# Patient Record
Sex: Female | Born: 1965 | Race: White | Hispanic: No | Smoking: Current some day smoker
Health system: Southern US, Community
[De-identification: ages and names within clinical notes are randomized; demographics above are authoritative.]

## PROBLEM LIST (undated history)

## (undated) DIAGNOSIS — G4730 Sleep apnea, unspecified: Secondary | ICD-10-CM

## (undated) DIAGNOSIS — E785 Hyperlipidemia, unspecified: Secondary | ICD-10-CM

## (undated) DIAGNOSIS — Z9889 Other specified postprocedural states: Secondary | ICD-10-CM

## (undated) DIAGNOSIS — R0600 Dyspnea, unspecified: Secondary | ICD-10-CM

## (undated) DIAGNOSIS — M1990 Unspecified osteoarthritis, unspecified site: Secondary | ICD-10-CM

## (undated) DIAGNOSIS — T884XXA Failed or difficult intubation, initial encounter: Secondary | ICD-10-CM

## (undated) DIAGNOSIS — F419 Anxiety disorder, unspecified: Secondary | ICD-10-CM

## (undated) DIAGNOSIS — J449 Chronic obstructive pulmonary disease, unspecified: Secondary | ICD-10-CM

## (undated) DIAGNOSIS — K5792 Diverticulitis of intestine, part unspecified, without perforation or abscess without bleeding: Secondary | ICD-10-CM

## (undated) DIAGNOSIS — G8929 Other chronic pain: Secondary | ICD-10-CM

## (undated) DIAGNOSIS — Z720 Tobacco use: Secondary | ICD-10-CM

## (undated) DIAGNOSIS — I739 Peripheral vascular disease, unspecified: Secondary | ICD-10-CM

## (undated) DIAGNOSIS — M797 Fibromyalgia: Secondary | ICD-10-CM

## (undated) DIAGNOSIS — R112 Nausea with vomiting, unspecified: Secondary | ICD-10-CM

## (undated) DIAGNOSIS — I10 Essential (primary) hypertension: Secondary | ICD-10-CM

## (undated) DIAGNOSIS — C50919 Malignant neoplasm of unspecified site of unspecified female breast: Secondary | ICD-10-CM

## (undated) DIAGNOSIS — M549 Dorsalgia, unspecified: Secondary | ICD-10-CM

## (undated) HISTORY — DX: Chronic obstructive pulmonary disease, unspecified: J44.9

## (undated) HISTORY — DX: Hyperlipidemia, unspecified: E78.5

## (undated) HISTORY — DX: Diverticulitis of intestine, part unspecified, without perforation or abscess without bleeding: K57.92

## (undated) HISTORY — PX: ABDOMINAL HYSTERECTOMY: SHX81

## (undated) HISTORY — DX: Malignant neoplasm of unspecified site of unspecified female breast: C50.919

## (undated) HISTORY — DX: Unspecified osteoarthritis, unspecified site: M19.90

## (undated) HISTORY — DX: Fibromyalgia: M79.7

## (undated) HISTORY — DX: Anxiety disorder, unspecified: F41.9

## (undated) HISTORY — DX: Sleep apnea, unspecified: G47.30

## (undated) HISTORY — PX: CHOLECYSTECTOMY: SHX55

---

## 2003-10-27 ENCOUNTER — Inpatient Hospital Stay (HOSPITAL_COMMUNITY): Admission: AD | Admit: 2003-10-27 | Discharge: 2003-10-27 | Payer: Self-pay | Admitting: Family Medicine

## 2004-04-26 ENCOUNTER — Emergency Department (HOSPITAL_COMMUNITY): Admission: EM | Admit: 2004-04-26 | Discharge: 2004-04-26 | Payer: Self-pay | Admitting: Emergency Medicine

## 2004-06-19 ENCOUNTER — Emergency Department (HOSPITAL_COMMUNITY): Admission: EM | Admit: 2004-06-19 | Discharge: 2004-06-20 | Payer: Self-pay | Admitting: Emergency Medicine

## 2004-06-21 ENCOUNTER — Emergency Department (HOSPITAL_COMMUNITY): Admission: EM | Admit: 2004-06-21 | Discharge: 2004-06-21 | Payer: Self-pay | Admitting: Emergency Medicine

## 2004-08-26 ENCOUNTER — Emergency Department (HOSPITAL_COMMUNITY): Admission: EM | Admit: 2004-08-26 | Discharge: 2004-08-26 | Payer: Self-pay | Admitting: *Deleted

## 2004-11-18 ENCOUNTER — Emergency Department (HOSPITAL_COMMUNITY): Admission: EM | Admit: 2004-11-18 | Discharge: 2004-11-18 | Payer: Self-pay | Admitting: Emergency Medicine

## 2005-07-15 ENCOUNTER — Ambulatory Visit (HOSPITAL_COMMUNITY): Admission: RE | Admit: 2005-07-15 | Discharge: 2005-07-15 | Payer: Self-pay | Admitting: Family Medicine

## 2006-09-13 ENCOUNTER — Emergency Department (HOSPITAL_COMMUNITY): Admission: EM | Admit: 2006-09-13 | Discharge: 2006-09-13 | Payer: Self-pay | Admitting: Emergency Medicine

## 2008-02-03 ENCOUNTER — Emergency Department (HOSPITAL_COMMUNITY): Admission: EM | Admit: 2008-02-03 | Discharge: 2008-02-03 | Payer: Self-pay | Admitting: Emergency Medicine

## 2008-03-30 ENCOUNTER — Emergency Department (HOSPITAL_COMMUNITY): Admission: EM | Admit: 2008-03-30 | Discharge: 2008-03-30 | Payer: Self-pay | Admitting: Emergency Medicine

## 2009-12-23 ENCOUNTER — Emergency Department (HOSPITAL_COMMUNITY)
Admission: EM | Admit: 2009-12-23 | Discharge: 2009-12-24 | Payer: Self-pay | Source: Home / Self Care | Admitting: Emergency Medicine

## 2009-12-24 ENCOUNTER — Emergency Department (HOSPITAL_COMMUNITY)
Admission: EM | Admit: 2009-12-24 | Discharge: 2009-12-25 | Payer: Self-pay | Source: Home / Self Care | Admitting: Emergency Medicine

## 2010-04-29 LAB — DIFFERENTIAL
Basophils Absolute: 0 10*3/uL (ref 0.0–0.1)
Basophils Relative: 0 % (ref 0–1)
Eosinophils Absolute: 0.1 10*3/uL (ref 0.0–0.7)
Eosinophils Relative: 1 % (ref 0–5)
Lymphocytes Relative: 31 % (ref 12–46)
Lymphs Abs: 2.8 10*3/uL (ref 0.7–4.0)
Monocytes Absolute: 0.4 10*3/uL (ref 0.1–1.0)
Monocytes Relative: 4 % (ref 3–12)
Neutro Abs: 5.7 10*3/uL (ref 1.7–7.7)
Neutrophils Relative %: 63 % (ref 43–77)

## 2010-04-29 LAB — URINALYSIS, ROUTINE W REFLEX MICROSCOPIC
Bilirubin Urine: NEGATIVE
Bilirubin Urine: NEGATIVE
Glucose, UA: >1000 mg/dL — AB
Glucose, UA: >1000 mg/dL — AB
Hgb urine dipstick: NEGATIVE
Hgb urine dipstick: NEGATIVE
Ketones, ur: NEGATIVE mg/dL
Ketones, ur: NEGATIVE mg/dL
Leukocytes, UA: NEGATIVE
Leukocytes, UA: NEGATIVE
Nitrite: NEGATIVE
Nitrite: NEGATIVE
Protein, ur: NEGATIVE mg/dL
Protein, ur: NEGATIVE mg/dL
Specific Gravity, Urine: 1.006 (ref 1.005–1.030)
Specific Gravity, Urine: 1.022 (ref 1.005–1.030)
Urobilinogen, UA: 0.2 mg/dL (ref 0.0–1.0)
Urobilinogen, UA: 1 mg/dL (ref 0.0–1.0)
pH: 6.5 (ref 5.0–8.0)
pH: 6.5 (ref 5.0–8.0)

## 2010-04-29 LAB — POCT PREGNANCY, URINE: Preg Test, Ur: NEGATIVE

## 2010-04-29 LAB — CBC
HCT: 44 % (ref 36.0–46.0)
Hemoglobin: 15.6 g/dL — ABNORMAL HIGH (ref 12.0–15.0)
MCHC: 35.5 g/dL (ref 30.0–36.0)
MCV: 87.2 fL (ref 78.0–100.0)
Platelets: 283 10*3/uL (ref 150–400)
RBC: 5.04 MIL/uL (ref 3.87–5.11)
RDW: 14.2 % (ref 11.5–15.5)
WBC: 9 10*3/uL (ref 4.0–10.5)

## 2010-04-29 LAB — POCT I-STAT, CHEM 8
BUN: 10 mg/dL (ref 6–23)
Calcium, Ion: 1.16 mmol/L (ref 1.12–1.32)
Chloride: 98 mEq/L (ref 96–112)
Creatinine, Ser: 0.8 mg/dL (ref 0.4–1.2)
Glucose, Bld: 336 mg/dL — ABNORMAL HIGH (ref 70–99)
HCT: 47 % — ABNORMAL HIGH (ref 36.0–46.0)
Hemoglobin: 16 g/dL — ABNORMAL HIGH (ref 12.0–15.0)
Potassium: 4.1 mEq/L (ref 3.5–5.1)
Sodium: 133 mEq/L — ABNORMAL LOW (ref 135–145)
TCO2: 31 mmol/L (ref 0–100)

## 2010-04-29 LAB — URINE MICROSCOPIC-ADD ON

## 2010-04-29 LAB — GLUCOSE, CAPILLARY
Glucose-Capillary: 213 mg/dL — ABNORMAL HIGH (ref 70–99)
Glucose-Capillary: 458 mg/dL — ABNORMAL HIGH (ref 70–99)

## 2010-10-29 LAB — COMPREHENSIVE METABOLIC PANEL
ALT: 45 — ABNORMAL HIGH
AST: 34
Albumin: 3.3 — ABNORMAL LOW
Alkaline Phosphatase: 126 — ABNORMAL HIGH
BUN: 6
CO2: 31
Calcium: 9.3
Chloride: 103
Creatinine, Ser: 0.79
GFR calc Af Amer: >60
GFR calc non Af Amer: >60
Glucose, Bld: 164 — ABNORMAL HIGH
Potassium: 3.9
Sodium: 139
Total Bilirubin: 0.7
Total Protein: 6.6

## 2010-10-29 LAB — CBC
HCT: 37.5
Hemoglobin: 13.1
MCHC: 34.9
MCV: 85.5
Platelets: 315
RBC: 4.38
RDW: 14.3 — ABNORMAL HIGH
WBC: 9.1

## 2010-10-29 LAB — DIFFERENTIAL
Basophils Absolute: 0
Basophils Relative: 1
Eosinophils Absolute: 0.1
Eosinophils Relative: 1
Lymphocytes Relative: 30
Lymphs Abs: 2.7
Monocytes Absolute: 0.4
Monocytes Relative: 5
Neutro Abs: 5.8
Neutrophils Relative %: 64

## 2010-10-29 LAB — URINALYSIS, ROUTINE W REFLEX MICROSCOPIC
Bilirubin Urine: NEGATIVE
Glucose, UA: NEGATIVE
Hgb urine dipstick: NEGATIVE
Ketones, ur: NEGATIVE
Nitrite: NEGATIVE
Protein, ur: NEGATIVE
Specific Gravity, Urine: 1.008
Urobilinogen, UA: 0.2
pH: 6

## 2010-10-29 LAB — LIPASE, BLOOD: Lipase: 25

## 2010-11-15 ENCOUNTER — Emergency Department (HOSPITAL_COMMUNITY)
Admission: EM | Admit: 2010-11-15 | Discharge: 2010-11-15 | Disposition: A | Discharge status: Home / Self Care | Payer: Self-pay | Attending: Emergency Medicine | Admitting: Emergency Medicine

## 2010-11-15 DIAGNOSIS — M545 Low back pain, unspecified: Secondary | ICD-10-CM | POA: Insufficient documentation

## 2010-11-15 DIAGNOSIS — M549 Dorsalgia, unspecified: Secondary | ICD-10-CM | POA: Insufficient documentation

## 2010-11-15 DIAGNOSIS — E119 Type 2 diabetes mellitus without complications: Secondary | ICD-10-CM | POA: Insufficient documentation

## 2010-11-27 ENCOUNTER — Emergency Department (HOSPITAL_COMMUNITY)
Admission: EM | Admit: 2010-11-27 | Discharge: 2010-11-27 | Disposition: A | Discharge status: Home / Self Care | Payer: Self-pay | Attending: Emergency Medicine | Admitting: Emergency Medicine

## 2010-11-27 ENCOUNTER — Encounter: Payer: Self-pay | Admitting: *Deleted

## 2010-11-27 DIAGNOSIS — M5380 Other specified dorsopathies, site unspecified: Secondary | ICD-10-CM | POA: Insufficient documentation

## 2010-11-27 DIAGNOSIS — M545 Low back pain, unspecified: Secondary | ICD-10-CM

## 2010-11-27 DIAGNOSIS — M549 Dorsalgia, unspecified: Secondary | ICD-10-CM | POA: Insufficient documentation

## 2010-11-27 DIAGNOSIS — G8929 Other chronic pain: Secondary | ICD-10-CM

## 2010-11-27 HISTORY — DX: Other chronic pain: G89.29

## 2010-11-27 HISTORY — DX: Essential (primary) hypertension: I10

## 2010-11-27 HISTORY — DX: Dorsalgia, unspecified: M54.9

## 2010-11-27 MED ORDER — PREDNISONE 50 MG PO TABS: ORAL_TABLET | ORAL | Status: AC

## 2010-11-27 MED ORDER — PREDNISONE 20 MG PO TABS
60.0000 mg | ORAL_TABLET | Freq: Once | ORAL | Status: AC
  Administered 2010-11-27: 60 mg via ORAL
  Filled 2010-11-27: qty 3

## 2010-11-27 MED ORDER — HYDROCODONE-ACETAMINOPHEN 5-325 MG PO TABS
1.0000 | ORAL_TABLET | Freq: Once | ORAL | Status: AC
  Administered 2010-11-27: 1 via ORAL
  Filled 2010-11-27: qty 1

## 2010-11-27 MED ORDER — HYDROCODONE-ACETAMINOPHEN 5-500 MG PO TABS: 1.0000 | ORAL_TABLET | Freq: Four times a day (QID) | ORAL | Status: AC | PRN

## 2010-11-27 NOTE — ED Provider Notes (Signed)
History     CSN: [id number] Arrival date & time: 11/27/2010  4:41 PM   First MD Initiated Contact with Patient 11/27/10 1753      Chief Complaint  Patient presents with  . Back Pain    (Consider location/radiation/quality/duration/timing/severity/associated sxs/prior treatment) HPI  Patient with history of chronic back pain stating that she was told approximately 2 years ago that she had arthritis of her lower back by her primary care doctor presents to emergency department complaining of a couple week history of increasing lower back pain stating that she was seen by emergency department at the end of October and prescribed Valium and ibuprofen with mild improvement with ibuprofen use but ongoing pain and therefore went to see her primary care doctor last week and was prescribed tramadol but states despite medication use ongoing pain. Patient states pain is aggravated by movement and standing. patient states increased pain with walking with radiation of pain into bilateral thighs. Patient states pain is improved by lying down on her side. Patient denies associated lower extremity numbness/tingling/weakness, loss of bowel or bladder function, or saddle feet paresthesias. Patient states pain is similar to flares of her chronic back pain. Patient denies fevers, chills, abdominal pain, flank pain, dysuria, hematuria, blood in her stool.  Past Medical History  Diagnosis Date  . Diabetes mellitus   . Hypertension   . Chronic back pain     Past Surgical History  Procedure Date  . Cholecystectomy   . Abdominal hysterectomy     No family history on file.  History  Substance Use Topics  . Smoking status: Current Everyday Smoker  . Smokeless tobacco: Not on file  . Alcohol Use: No    OB History    Grav Para Term Preterm Abortions TAB SAB Ect Mult Living                  Review of Systems  All other systems reviewed and are negative.    Allergies  Codeine and Sulfonamide  derivatives  Home Medications  No current outpatient prescriptions on file.  BP 142/75  Pulse 78  Temp 98 F (36.7 C)  Resp 20  SpO2 100%  Physical Exam  Nursing note and vitals reviewed. Constitutional: She is oriented to person, place, and time. She appears well-developed and well-nourished. No distress.  HENT:  Head: Normocephalic and atraumatic.  Eyes: Conjunctivae are normal.  Neck: Normal range of motion. Neck supple.  Cardiovascular: Normal rate, regular rhythm, normal heart sounds and intact distal pulses.  Exam reveals no gallop and no friction rub.   No murmur heard. Pulmonary/Chest: Effort normal and breath sounds normal. No respiratory distress. She has no wheezes. She has no rales. She exhibits no tenderness.  Abdominal: Bowel sounds are normal. She exhibits no distension and no mass. There is no tenderness. There is no rebound and no guarding.  Musculoskeletal: Normal range of motion. She exhibits no edema and no tenderness.       Lumbar back: She exhibits tenderness and spasm. She exhibits no swelling and no edema.       Lower lumbar spinal and paraspinal tenderness to palpation with muscle spasticity but no changes in skin and no rash.  Neurological: She is alert and oriented to person, place, and time.  Skin: Skin is warm and dry. No rash noted. She is not diaphoretic. No erythema.  Psychiatric: She has a normal mood and affect.    ED Course  Procedures (including critical care time)  By  mouth prednisone and hydrocodone-acetaminophen.  Labs Reviewed - No data to display No results found.   1. Lower back pain   2. Chronic back pain       MDM  Patient is a non-insulin-dependent diabetic stating sugars well controlled and therefore will prescribe prednisone for inflammation. Patient is alert and oriented with no neuro focal findings and no signs or symptoms of cauda equina persistent cord compression. Soft tissue tenderness to palpation of lower back with  muscle spasticity. the patient ambulating without difficulty. 5 out of 5 strength of lower extremities with lower extremities neurovascularly intact. History of acute on chronic lower back pain. Patient is agreeable to following up with primary care doctor for further evaluation of ongoing pain.        Jenness [place], [place] 11/28/10 [phone]

## 2010-11-27 NOTE — ED Notes (Signed)
Pt has had lower back pain for a while, worse last week, saw PMD and started on Tramadol and Flexeril, no relief, pain now in back, hips and legs

## 2010-11-28 NOTE — ED Provider Notes (Signed)
Medical screening examination/treatment/procedure(s) were performed by non-physician practitioner and as supervising physician I was immediately available for consultation/collaboration.  Leomia Blake M Lanaysia Fritchman, MD 11/28/10 1241 

## 2012-02-11 ENCOUNTER — Encounter (HOSPITAL_COMMUNITY): Payer: Self-pay | Admitting: *Deleted

## 2012-02-11 ENCOUNTER — Emergency Department (HOSPITAL_COMMUNITY)
Admission: EM | Admit: 2012-02-11 | Discharge: 2012-02-11 | Disposition: A | Discharge status: Home / Self Care | Payer: Self-pay | Attending: Emergency Medicine | Admitting: Emergency Medicine

## 2012-02-11 DIAGNOSIS — R059 Cough, unspecified: Secondary | ICD-10-CM | POA: Insufficient documentation

## 2012-02-11 DIAGNOSIS — Z79899 Other long term (current) drug therapy: Secondary | ICD-10-CM | POA: Insufficient documentation

## 2012-02-11 DIAGNOSIS — F17200 Nicotine dependence, unspecified, uncomplicated: Secondary | ICD-10-CM | POA: Insufficient documentation

## 2012-02-11 DIAGNOSIS — B349 Viral infection, unspecified: Secondary | ICD-10-CM

## 2012-02-11 DIAGNOSIS — R05 Cough: Secondary | ICD-10-CM | POA: Insufficient documentation

## 2012-02-11 DIAGNOSIS — J3489 Other specified disorders of nose and nasal sinuses: Secondary | ICD-10-CM | POA: Insufficient documentation

## 2012-02-11 DIAGNOSIS — IMO0001 Reserved for inherently not codable concepts without codable children: Secondary | ICD-10-CM | POA: Insufficient documentation

## 2012-02-11 DIAGNOSIS — E119 Type 2 diabetes mellitus without complications: Secondary | ICD-10-CM | POA: Insufficient documentation

## 2012-02-11 DIAGNOSIS — I10 Essential (primary) hypertension: Secondary | ICD-10-CM | POA: Insufficient documentation

## 2012-02-11 DIAGNOSIS — B9789 Other viral agents as the cause of diseases classified elsewhere: Secondary | ICD-10-CM | POA: Insufficient documentation

## 2012-02-11 LAB — URINALYSIS, ROUTINE W REFLEX MICROSCOPIC
Bilirubin Urine: NEGATIVE
Glucose, UA: NEGATIVE mg/dL
Hgb urine dipstick: NEGATIVE
Ketones, ur: NEGATIVE mg/dL
Leukocytes, UA: NEGATIVE
Nitrite: NEGATIVE
Protein, ur: NEGATIVE mg/dL
Specific Gravity, Urine: 1.007 (ref 1.005–1.030)
Urobilinogen, UA: 1 mg/dL (ref 0.0–1.0)
pH: 6.5 (ref 5.0–8.0)

## 2012-02-11 LAB — POCT I-STAT, CHEM 8
BUN: <3 mg/dL — ABNORMAL LOW (ref 6–23)
Calcium, Ion: 1.12 mmol/L (ref 1.12–1.23)
Chloride: 104 mEq/L (ref 96–112)
Creatinine, Ser: 0.8 mg/dL (ref 0.50–1.10)
Glucose, Bld: 108 mg/dL — ABNORMAL HIGH (ref 70–99)
HCT: 35 % — ABNORMAL LOW (ref 36.0–46.0)
Hemoglobin: 11.9 g/dL — ABNORMAL LOW (ref 12.0–15.0)
Potassium: 3.4 mEq/L — ABNORMAL LOW (ref 3.5–5.1)
Sodium: 142 mEq/L (ref 135–145)
TCO2: 27 mmol/L (ref 0–100)

## 2012-02-11 LAB — CBC WITH DIFFERENTIAL/PLATELET
Basophils Absolute: 0 10*3/uL (ref 0.0–0.1)
Basophils Relative: 0 % (ref 0–1)
Eosinophils Absolute: 0.2 10*3/uL (ref 0.0–0.7)
Eosinophils Relative: 3 % (ref 0–5)
HCT: 36.1 % (ref 36.0–46.0)
Hemoglobin: 12.2 g/dL (ref 12.0–15.0)
Lymphocytes Relative: 21 % (ref 12–46)
Lymphs Abs: 1.6 10*3/uL (ref 0.7–4.0)
MCH: 30.5 pg (ref 26.0–34.0)
MCHC: 33.8 g/dL (ref 30.0–36.0)
MCV: 90.3 fL (ref 78.0–100.0)
Monocytes Absolute: 0.5 10*3/uL (ref 0.1–1.0)
Monocytes Relative: 7 % (ref 3–12)
Neutro Abs: 5.3 10*3/uL (ref 1.7–7.7)
Neutrophils Relative %: 69 % (ref 43–77)
Platelets: 257 10*3/uL (ref 150–400)
RBC: 4 MIL/uL (ref 3.87–5.11)
RDW: 14.3 % (ref 11.5–15.5)
WBC: 7.7 10*3/uL (ref 4.0–10.5)

## 2012-02-11 MED ORDER — SODIUM CHLORIDE 0.9 % IV BOLUS (SEPSIS)
1000.0000 mL | Freq: Once | INTRAVENOUS | Status: AC
  Administered 2012-02-11: 1000 mL via INTRAVENOUS

## 2012-02-11 NOTE — ED Provider Notes (Signed)
History     CSN: [id number]  Arrival date & time 02/11/12  1851   First MD Initiated Contact with Patient 02/11/12 2013      Chief Complaint  Patient presents with  . Cough  . Fever  . Chills    (Consider location/radiation/quality/duration/timing/severity/associated sxs/prior treatment) HPI  Pt presents to the ED with complaints of flu-like symptoms of cough, congestion,, muscle aches, chills, fevers, and a small amount of diarrhea. The patient states that the symptoms started 3 days ago.  Pt has been around other sick contacts and did not get the flu shot this year. The patient denies headaches, neck pain, weakness, vision changes, severe abdominal pain, inability to eat or drink, difficulty breathing, SOB, wheezing, chest pain. The patient has tried cough medicine, NSAIDS, and rest but has only felt mild relief. She has a positive PMH of diabetes and hypertension.   Past Medical History  Diagnosis Date  . Diabetes mellitus   . Hypertension   . Chronic back pain     Past Surgical History  Procedure Date  . Cholecystectomy   . Abdominal hysterectomy     History reviewed. No pertinent family history.  History  Substance Use Topics  . Smoking status: Current Every Day Smoker  . Smokeless tobacco: Not on file  . Alcohol Use: No    OB History    Grav Para Term Preterm Abortions TAB SAB Ect Mult Living                  Review of Systems  All other systems reviewed and are negative.    Allergies  Codeine and Sulfonamide derivatives  Home Medications   Current Outpatient Rx  Name  Route  Sig  Dispense  Refill  . GLIMEPIRIDE 2 MG PO TABS   Oral   Take 2 mg by mouth every morning.         . IBUPROFEN 200 MG PO TABS   Oral   Take 400 mg by mouth every 6 (six) hours as needed. For pain         . LISINOPRIL 10 MG PO TABS   Oral   Take 10 mg by mouth every morning.         Marland Kitchen METFORMIN HCL ER (MOD) 1000 MG PO TB24   Oral   Take 1,000 mg by mouth 2  (two) times daily.         Marland Kitchen PRAVASTATIN SODIUM 40 MG PO TABS   Oral   Take 40 mg by mouth at bedtime.           BP 168/72  Pulse 97  Temp 99.6 F (37.6 C) (Oral)  Resp 22  Ht 5\' 5"  (1.651 m)  Wt 193 lb (87.544 kg)  BMI 32.12 kg/m2  SpO2 96%  Physical Exam  Nursing note and vitals reviewed. Constitutional: She appears well-developed and well-nourished. No distress.  HENT:  Head: Normocephalic and atraumatic.  Eyes: Pupils are equal, round, and reactive to light.  Neck: Normal range of motion. Neck supple.  Cardiovascular: Normal rate and regular rhythm.   Pulmonary/Chest: Effort normal.  Abdominal: Soft.  Neurological: She is alert.  Skin: Skin is warm and dry.    ED Course  Procedures (including critical care time)  Labs Reviewed  URINALYSIS, ROUTINE W REFLEX MICROSCOPIC - Abnormal; Notable for the following:    APPearance CLOUDY (*)     All other components within normal limits  POCT I-STAT, CHEM 8 - Abnormal; Notable for  the following:    Potassium 3.4 (*)     BUN <3 (*)     Glucose, Bld 108 (*)     Hemoglobin 11.9 (*)     HCT 35.0 (*)     All other components within normal limits  CBC WITH DIFFERENTIAL   No results found.   1. Hypertension   2. Diabetes   3. Viral syndrome       MDM  Given 1 L normal saline to rehydrate. Pt feeling better, wants to take NyQuil and DayQuil for cough. She can take Tylenol and Motrin for pain. She has been reassured about her symptoms. Sugar is close to baseline. I discussed that she needs follow-up with PCP to discuss better management of her hypertension.   Pt has been advised of the symptoms that warrant their return to the ED. Patient has voiced understanding and has agreed to follow-up with the PCP or specialist.         Dorthula Matas, PA 02/11/12 2118

## 2012-02-11 NOTE — ED Notes (Signed)
Cough fever and chills for 3 days,  Pt states she is aching all over, feels very weak,  Pt states she took Advil 2 tablets at 530 pm,  Also complaints of HA

## 2012-02-12 NOTE — ED Provider Notes (Signed)
Medical screening examination/treatment/procedure(s) were performed by non-physician practitioner and as supervising physician I was immediately available for consultation/collaboration.   Flint Melter, MD 02/12/12 [phone]

## 2012-10-02 ENCOUNTER — Ambulatory Visit: Payer: Self-pay

## 2012-10-13 ENCOUNTER — Emergency Department (HOSPITAL_COMMUNITY)
Admission: EM | Admit: 2012-10-13 | Discharge: 2012-10-13 | Disposition: A | Discharge status: Home / Self Care | Payer: Self-pay | Attending: Emergency Medicine | Admitting: Emergency Medicine

## 2012-10-13 ENCOUNTER — Encounter (HOSPITAL_COMMUNITY): Payer: Self-pay

## 2012-10-13 ENCOUNTER — Emergency Department (HOSPITAL_COMMUNITY): Payer: Self-pay

## 2012-10-13 DIAGNOSIS — R630 Anorexia: Secondary | ICD-10-CM | POA: Insufficient documentation

## 2012-10-13 DIAGNOSIS — R110 Nausea: Secondary | ICD-10-CM | POA: Insufficient documentation

## 2012-10-13 DIAGNOSIS — E119 Type 2 diabetes mellitus without complications: Secondary | ICD-10-CM | POA: Insufficient documentation

## 2012-10-13 DIAGNOSIS — I4902 Ventricular flutter: Secondary | ICD-10-CM | POA: Insufficient documentation

## 2012-10-13 DIAGNOSIS — F17200 Nicotine dependence, unspecified, uncomplicated: Secondary | ICD-10-CM | POA: Insufficient documentation

## 2012-10-13 DIAGNOSIS — I10 Essential (primary) hypertension: Secondary | ICD-10-CM | POA: Insufficient documentation

## 2012-10-13 DIAGNOSIS — E871 Hypo-osmolality and hyponatremia: Secondary | ICD-10-CM | POA: Insufficient documentation

## 2012-10-13 DIAGNOSIS — E876 Hypokalemia: Secondary | ICD-10-CM | POA: Insufficient documentation

## 2012-10-13 DIAGNOSIS — Z79899 Other long term (current) drug therapy: Secondary | ICD-10-CM | POA: Insufficient documentation

## 2012-10-13 DIAGNOSIS — G8929 Other chronic pain: Secondary | ICD-10-CM | POA: Insufficient documentation

## 2012-10-13 LAB — CBC WITH DIFFERENTIAL/PLATELET
Basophils Absolute: 0.1 10*3/uL (ref 0.0–0.1)
Basophils Relative: 0 % (ref 0–1)
Eosinophils Absolute: 0.1 10*3/uL (ref 0.0–0.7)
Eosinophils Relative: 1 % (ref 0–5)
HCT: 37.2 % (ref 36.0–46.0)
Hemoglobin: 13.4 g/dL (ref 12.0–15.0)
Lymphocytes Relative: 20 % (ref 12–46)
Lymphs Abs: 2.7 10*3/uL (ref 0.7–4.0)
MCH: 31.1 pg (ref 26.0–34.0)
MCHC: 36 g/dL (ref 30.0–36.0)
MCV: 86.3 fL (ref 78.0–100.0)
Monocytes Absolute: 0.7 10*3/uL (ref 0.1–1.0)
Monocytes Relative: 5 % (ref 3–12)
Neutro Abs: 10 10*3/uL — ABNORMAL HIGH (ref 1.7–7.7)
Neutrophils Relative %: 74 % (ref 43–77)
Platelets: 313 10*3/uL (ref 150–400)
RBC: 4.31 MIL/uL (ref 3.87–5.11)
RDW: 14 % (ref 11.5–15.5)
WBC: 13.6 10*3/uL — ABNORMAL HIGH (ref 4.0–10.5)

## 2012-10-13 LAB — TROPONIN I: Troponin I: <0.3 ng/mL (ref <0.30)

## 2012-10-13 LAB — URINALYSIS, ROUTINE W REFLEX MICROSCOPIC
Bilirubin Urine: NEGATIVE
Glucose, UA: NEGATIVE mg/dL
Hgb urine dipstick: NEGATIVE
Ketones, ur: NEGATIVE mg/dL
Leukocytes, UA: NEGATIVE
Nitrite: NEGATIVE
Protein, ur: NEGATIVE mg/dL
Specific Gravity, Urine: 1.014 (ref 1.005–1.030)
Urobilinogen, UA: 0.2 mg/dL (ref 0.0–1.0)
pH: 5.5 (ref 5.0–8.0)

## 2012-10-13 LAB — COMPREHENSIVE METABOLIC PANEL
ALT: 36 U/L — ABNORMAL HIGH (ref 0–35)
AST: 43 U/L — ABNORMAL HIGH (ref 0–37)
Albumin: 3.5 g/dL (ref 3.5–5.2)
Alkaline Phosphatase: 139 U/L — ABNORMAL HIGH (ref 39–117)
BUN: 5 mg/dL — ABNORMAL LOW (ref 6–23)
CO2: 25 mEq/L (ref 19–32)
Calcium: 9.1 mg/dL (ref 8.4–10.5)
Chloride: 86 mEq/L — ABNORMAL LOW (ref 96–112)
Creatinine, Ser: 0.74 mg/dL (ref 0.50–1.10)
GFR calc Af Amer: >90 mL/min (ref >90)
GFR calc non Af Amer: >90 mL/min (ref >90)
Glucose, Bld: 145 mg/dL — ABNORMAL HIGH (ref 70–99)
Potassium: 3 mEq/L — ABNORMAL LOW (ref 3.5–5.1)
Sodium: 126 mEq/L — ABNORMAL LOW (ref 135–145)
Total Bilirubin: 0.3 mg/dL (ref 0.3–1.2)
Total Protein: 7.7 g/dL (ref 6.0–8.3)

## 2012-10-13 LAB — LIPASE, BLOOD: Lipase: 34 U/L (ref 11–59)

## 2012-10-13 MED ORDER — SODIUM CHLORIDE 0.9 % IV BOLUS (SEPSIS)
1000.0000 mL | Freq: Once | INTRAVENOUS | Status: AC
  Administered 2012-10-13: 1000 mL via INTRAVENOUS

## 2012-10-13 MED ORDER — GI COCKTAIL ~~LOC~~
30.0000 mL | Freq: Once | ORAL | Status: AC
  Administered 2012-10-13: 30 mL via ORAL
  Filled 2012-10-13: qty 30

## 2012-10-13 NOTE — ED Notes (Signed)
Pt c/o nausea, heart fluttering and urinary retention x last 3 days.  Also c/o feeling tired, low grade fevers, dizziness and decreased appetite.

## 2012-10-13 NOTE — ED Provider Notes (Signed)
CSN: [id number]     Arrival date & time 10/13/12  1845 History   First MD Initiated Contact with Patient 10/13/12 1859     Chief Complaint  Patient presents with  . Nausea  . heart flutters   . Urinary Retention   (Consider location/radiation/quality/duration/timing/severity/associated sxs/prior Treatment) HPI Patient presents with multiple complaints. Symptoms began several days ago, without clear precipitant.  Since onset she has had mild nausea, generalized discomfort, anorexia. No focal pain. Today, several hours ago, she developed a fluttering sensation in her anterior chest. There is no new associated dyspnea, syncope, vomiting, diarrhea. Patient states that she has been compliant with all medication. No clear alleviating or exacerbating factors.  Past Medical History  Diagnosis Date  . Diabetes mellitus   . Hypertension   . Chronic back pain    Past Surgical History  Procedure Laterality Date  . Cholecystectomy    . Abdominal hysterectomy     No family history on file. History  Substance Use Topics  . Smoking status: Current Every Day Smoker -- 1.00 packs/day    Types: Cigarettes  . Smokeless tobacco: Not on file  . Alcohol Use: No   OB History   Grav Para Term Preterm Abortions TAB SAB Ect Mult Living                 Review of Systems  Constitutional:       Per HPI, otherwise negative  HENT:       Per HPI, otherwise negative  Respiratory:       Per HPI, otherwise negative  Cardiovascular:       Per HPI, otherwise negative  Gastrointestinal: Negative for vomiting.  Endocrine:       Negative aside from HPI  Genitourinary:       Neg aside from HPI   Musculoskeletal:       Per HPI, otherwise negative  Skin: Negative.   Neurological: Negative for syncope.    Allergies  Codeine; Lisinopril; and Sulfonamide derivatives  Home Medications   Current Outpatient Rx  Name  Route  Sig  Dispense  Refill  . glimepiride (AMARYL) 2 MG tablet   Oral   Take  2 mg by mouth every morning.         . hydrochlorothiazide (HYDRODIURIL) 25 MG tablet   Oral   Take 25 mg by mouth every morning.         Marland Kitchen ibuprofen (ADVIL,MOTRIN) 200 MG tablet   Oral   Take 400 mg by mouth every 6 (six) hours as needed for pain.          . metFORMIN (GLUMETZA) 1000 MG (MOD) 24 hr tablet   Oral   Take 1,000 mg by mouth 2 (two) times daily with a meal.          . pravastatin (PRAVACHOL) 40 MG tablet   Oral   Take 40 mg by mouth at bedtime.          BP 157/70  Pulse 93  Temp(Src) 98.3 F (36.8 C) (Oral)  Resp 16  SpO2 99% Physical Exam  Nursing note and vitals reviewed. Constitutional: She is oriented to person, place, and time. She appears well-developed and well-nourished. No distress.  HENT:  Head: Normocephalic and atraumatic.  Eyes: Conjunctivae and EOM are normal.  Cardiovascular: Normal rate and regular rhythm.   Pulmonary/Chest: Effort normal and breath sounds normal. No stridor. No respiratory distress.  Abdominal: She exhibits no distension.  Musculoskeletal: She exhibits no edema.  Neurological: She is alert and oriented to person, place, and time. No cranial nerve deficit.  Skin: Skin is warm and dry.  Psychiatric: She has a normal mood and affect.    ED Course  Procedures (including critical care time) Labs Review Labs Reviewed  CBC WITH DIFFERENTIAL - Abnormal; Notable for the following:    WBC 13.6 (*)    Neutro Abs 10.0 (*)    All other components within normal limits  COMPREHENSIVE METABOLIC PANEL - Abnormal; Notable for the following:    Sodium 126 (*)    Potassium 3.0 (*)    Chloride 86 (*)    Glucose, Bld 145 (*)    BUN 5 (*)    AST 43 (*)    ALT 36 (*)    Alkaline Phosphatase 139 (*)    All other components within normal limits  LIPASE, BLOOD  TROPONIN I  URINALYSIS, ROUTINE W REFLEX MICROSCOPIC   Imaging Review Dg Chest 2 View  10/13/2012   CLINICAL DATA:  Chest discomfort. Nausea. History of  hypertension.  EXAM: CHEST  2 VIEW  COMPARISON:  None.  FINDINGS: The heart size and mediastinal contours are within normal limits. Both lungs are clear. The visualized skeletal structures are unremarkable.  IMPRESSION: No active cardiopulmonary disease.   Electronically Signed   By: Amie Portland   On: 10/13/2012 19:57   Cardiac monitor 91 sinus rhythm normal EKG has sinus rhythm, 82, nonspecific T wave changes, abnormal  initial EKG had significant artifacts, was not amenable to interpretation.   Pulse ox 98% room air normal   8:59 PM Labs demonstrate electrolyte abnormalities w anion gap of 15 NS running.  9:34 PM Patient substantially better after 1 L NS  10:43 PM Patient sitting upright, drinking soda. MDM  No diagnosis found. This patient with multiple medical problem, including diabetes now presents with multiple complaints.  On exam she is awake alert, with no focal neurologic deficits, nor fever.  Though there are mild electrolyte abnormalities, the patient's hepatic function is consistent with prior studies. There is mild anion gap of 15.  Patient received IV fluids, substantially with improved vitals, appetite, and resolution of all complaints.  With multiple concerns, and her multiple medical problems, the a multifactorial etiology, likely reflecting some degree of dehydration.  Absent ischemic changes on EKG, any true chest pain, and with resolution of symptoms, there is low suspicion for ongoing coronary ischemia, or occult infection.  Gerhard Munch, MD 10/13/12 2245

## 2013-06-18 ENCOUNTER — Ambulatory Visit: Payer: Self-pay | Attending: Internal Medicine

## 2014-09-24 ENCOUNTER — Emergency Department (HOSPITAL_COMMUNITY): Payer: Self-pay

## 2014-09-24 ENCOUNTER — Emergency Department (HOSPITAL_COMMUNITY)
Admission: EM | Admit: 2014-09-24 | Discharge: 2014-09-24 | Disposition: A | Discharge status: Home / Self Care | Payer: Self-pay | Attending: Emergency Medicine | Admitting: Emergency Medicine

## 2014-09-24 ENCOUNTER — Encounter (HOSPITAL_COMMUNITY): Payer: Self-pay | Admitting: *Deleted

## 2014-09-24 DIAGNOSIS — Z720 Tobacco use: Secondary | ICD-10-CM | POA: Insufficient documentation

## 2014-09-24 DIAGNOSIS — M25571 Pain in right ankle and joints of right foot: Secondary | ICD-10-CM | POA: Insufficient documentation

## 2014-09-24 DIAGNOSIS — I10 Essential (primary) hypertension: Secondary | ICD-10-CM | POA: Insufficient documentation

## 2014-09-24 DIAGNOSIS — Z79899 Other long term (current) drug therapy: Secondary | ICD-10-CM | POA: Insufficient documentation

## 2014-09-24 DIAGNOSIS — G8929 Other chronic pain: Secondary | ICD-10-CM | POA: Insufficient documentation

## 2014-09-24 DIAGNOSIS — R002 Palpitations: Secondary | ICD-10-CM | POA: Insufficient documentation

## 2014-09-24 DIAGNOSIS — E119 Type 2 diabetes mellitus without complications: Secondary | ICD-10-CM | POA: Insufficient documentation

## 2014-09-24 LAB — BASIC METABOLIC PANEL
Anion gap: 10 (ref 5–15)
BUN: 10 mg/dL (ref 6–20)
CO2: 25 mmol/L (ref 22–32)
Calcium: 9.4 mg/dL (ref 8.9–10.3)
Chloride: 101 mmol/L (ref 101–111)
Creatinine, Ser: 0.97 mg/dL (ref 0.44–1.00)
GFR calc Af Amer: >60 mL/min (ref >60)
GFR calc non Af Amer: >60 mL/min (ref >60)
Glucose, Bld: 241 mg/dL — ABNORMAL HIGH (ref 65–99)
Potassium: 3.7 mmol/L (ref 3.5–5.1)
Sodium: 136 mmol/L (ref 135–145)

## 2014-09-24 LAB — I-STAT TROPONIN, ED: Troponin i, poc: 0.01 ng/mL (ref 0.00–0.08)

## 2014-09-24 LAB — CBC
HCT: 37.8 % (ref 36.0–46.0)
Hemoglobin: 12.9 g/dL (ref 12.0–15.0)
MCH: 31.2 pg (ref 26.0–34.0)
MCHC: 34.1 g/dL (ref 30.0–36.0)
MCV: 91.3 fL (ref 78.0–100.0)
Platelets: 277 10*3/uL (ref 150–400)
RBC: 4.14 MIL/uL (ref 3.87–5.11)
RDW: 15 % (ref 11.5–15.5)
WBC: 9.9 10*3/uL (ref 4.0–10.5)

## 2014-09-24 NOTE — ED Notes (Signed)
Patient transported to X-ray 

## 2014-09-24 NOTE — ED Provider Notes (Signed)
CSN: [id number]     Arrival date & time 09/24/14  1849 History   First MD Initiated Contact with Patient 09/24/14 1931     Chief Complaint  Patient presents with  . Palpitations  . Leg Swelling     (Consider location/radiation/quality/duration/timing/severity/associated sxs/prior Treatment) Patient is a 49 y.o. female presenting with palpitations.  Palpitations Palpitations quality: "fluttering" Onset quality:  Sudden Duration: started yesterday, had 2 episodes yesterday lasted seconds, and today had more frequent episodes today-4 today, lasting seconds. Timing:  Intermittent Progression:  Worsening Chronicity:  New Context comment:  Had similar episodes before, one was anxiety 11yr ago, last time when dehydrated--potassium low Relieved by:  Breathing exercises Worsened by:  Nothing (drinks caffeine every day fo ryears does not affect it) Ineffective treatments:  None tried Associated symptoms: leg pain (right ankle pain moves up leg)   Associated symptoms: no back pain, no chest pain, no cough, no diaphoresis, no dizziness, no lower extremity edema (1wk right ankle swelling, pain with pressure on it), no nausea, no shortness of breath, no syncope and no weakness   Risk factors: diabetes mellitus and stress   Risk factors: no hx of atrial fibrillation, no hx of DVT, no hx of PE, no hyperthyroidism and no OTC sinus medications     Past Medical History  Diagnosis Date  . Diabetes mellitus   . Hypertension   . Chronic back pain    Past Surgical History  Procedure Laterality Date  . Cholecystectomy    . Abdominal hysterectomy     No family history on file. Social History  Substance Use Topics  . Smoking status: Current Every Day Smoker -- 1.00 packs/day    Types: Cigarettes  . Smokeless tobacco: None  . Alcohol Use: No   OB History    No data available     Review of Systems  Constitutional: Negative for fever and diaphoresis.  HENT: Negative for sore throat.   Eyes:  Negative for visual disturbance.  Respiratory: Negative for cough and shortness of breath.   Cardiovascular: Positive for palpitations. Negative for chest pain and syncope.  Gastrointestinal: Negative for nausea and abdominal pain.  Genitourinary: Negative for difficulty urinating.  Musculoskeletal: Negative for back pain and neck pain.  Skin: Negative for rash.  Neurological: Negative for dizziness, syncope, weakness and headaches.      Allergies  Codeine; Lisinopril; and Sulfonamide derivatives  Home Medications   Prior to Admission medications   Medication Sig Start Date End Date Taking? Authorizing Provider  glimepiride (AMARYL) 2 MG tablet Take 2 mg by mouth every morning.   Yes Historical Provider, MD  ibuprofen (ADVIL,MOTRIN) 200 MG tablet Take 400 mg by mouth every 6 (six) hours as needed for pain.    Yes Historical Provider, MD  metFORMIN (GLUMETZA) 1000 MG (MOD) 24 hr tablet Take 1,000 mg by mouth 2 (two) times daily with a meal.    Yes Historical Provider, MD   BP 155/73 mmHg  Pulse 74  Temp(Src) 98 F (36.7 C) (Oral)  Resp 24  Ht 5\' 7"  (1.702 m)  Wt 204 lb (92.534 kg)  BMI 31.94 kg/m2  SpO2 99% Physical Exam  Constitutional: She is oriented to person, place, and time. She appears well-developed and well-nourished. No distress.  HENT:  Head: Normocephalic and atraumatic.  Eyes: Conjunctivae and EOM are normal.  Neck: Normal range of motion.  Cardiovascular: Normal rate, regular rhythm, normal heart sounds and intact distal pulses.  Exam reveals no gallop and no friction  rub.   No murmur heard. Pulmonary/Chest: Effort normal and breath sounds normal. No respiratory distress. She has no wheezes. She has no rales.  Abdominal: Soft. She exhibits no distension. There is no tenderness. There is no guarding.  Musculoskeletal: She exhibits no edema.       Right ankle: She exhibits swelling. She exhibits normal range of motion, no ecchymosis, no deformity, no laceration  and normal pulse. Tenderness. Lateral malleolus, AITFL and posterior TFL tenderness found.       Right lower leg: She exhibits no tenderness, no bony tenderness and no edema.  Neurological: She is alert and oriented to person, place, and time. She has normal strength. No cranial nerve deficit or sensory deficit. Coordination normal. GCS eye subscore is 4. GCS verbal subscore is 5. GCS motor subscore is 6.  Skin: Skin is warm and dry. No rash noted. She is not diaphoretic. No erythema.  Nursing note and vitals reviewed.   ED Course  Procedures (including critical care time) Labs Review Labs Reviewed  BASIC METABOLIC PANEL - Abnormal; Notable for the following:    Glucose, Bld 241 (*)    All other components within normal limits  CBC  I-STAT TROPOININ, ED    Imaging Review Dg Chest 2 View  09/24/2014   CLINICAL DATA:  Right ankle swelling for 1 week. Heart palpitations 09/23/2014.  EXAM: CHEST  2 VIEW  COMPARISON:  PA and lateral chest 10/13/2012.  FINDINGS: Heart size is upper normal. The lungs are clear. No pneumothorax or pleural effusion.  IMPRESSION: No acute disease.   Electronically Signed   By: Drusilla Kanner M.D.   On: 09/24/2014 19:52   Dg Ankle 2 Views Right  09/24/2014   CLINICAL DATA:  49 year old female with right ankle pain and swelling  EXAM: RIGHT ANKLE - 2 VIEW  COMPARISON:  None.  FINDINGS: There is no evidence of fracture, dislocation, or joint effusion. There is no evidence of arthropathy or other focal bone abnormality. Soft tissues are unremarkable.  IMPRESSION: Negative.   Electronically Signed   By: Elgie Collard M.D.   On: 09/24/2014 20:34   I have personally reviewed and evaluated these images and lab results as part of my medical decision-making.   EKG Interpretation   Date/Time:  Wednesday September 24 2014 19:07:27 EDT Ventricular Rate:  92 PR Interval:  149 QRS Duration: 77 QT Interval:  344 QTC Calculation: 425 R Axis:   43 Text Interpretation:   Sinus rhythm Anteroseptal infarct, old Baseline  wander in lead(s) II III aVF V4 V5 V6 No significant change since last  tracing Confirmed by LIU MD, DANA [phone]) on 09/24/2014 7:12:03 PM      MDM   Final diagnoses:  Right ankle pain  Palpitations   Yo female with history of DM, hypertension, chronic back pain presents with concern of palpitations and right ankle pain and swelling.  Palpitations brief episodes lasting seconds, with no symptoms or changes on telemetry while in ED.   EKG evaluated by me and shows sinus rhythm with no sign of prolonged QTc, no brugada, no sign of HOCM, no delta waves, no ST abnormalities.   CBC shows no anemia. BMP shows no electrolyte abnormalities.  No hx to suggest infection.  No continuous symptoms, no sob, no tachypnea, no hypoxia, no tachycardia, and have low suspicion for PE.  Patient reports right ankle swelling over the last week.  She does not have swelling of lower right leg or tenderness although notes pain radiates  from right ankle to calf when she ambulates. No risk factors for DVT/PE.  Swelling and tenderness now is localized to ankle. XR shows no abnormalities.  Doubt septic arthritis given full ROM.  Discussed that overall low suspicion for DVT given location of pain and tenderness, and risk of empiric anticoagulation outweighs suspected benefit given low suspicion however recommend obtaining DVT US for confirmation and order placed with recommendation to return to [facility] vascular lab.  Recommend PCP follow up for concern of palpitations and ankle pain.    Alvira Monday, MD 09/25/14 1225

## 2014-09-24 NOTE — ED Notes (Addendum)
Pt reports heart palpitations yesterday and R ankle swelling x 1 week.  Denies any SOB at this time.  Pt denies any cp but reports dizziness

## 2014-09-24 NOTE — Discharge Instructions (Signed)

## 2014-09-25 ENCOUNTER — Encounter (HOSPITAL_COMMUNITY): Payer: Self-pay

## 2015-07-28 ENCOUNTER — Emergency Department (HOSPITAL_COMMUNITY): Payer: Self-pay

## 2015-07-28 ENCOUNTER — Emergency Department (HOSPITAL_COMMUNITY)
Admission: EM | Admit: 2015-07-28 | Discharge: 2015-07-28 | Disposition: A | Discharge status: Home / Self Care | Payer: Self-pay | Attending: Emergency Medicine | Admitting: Emergency Medicine

## 2015-07-28 ENCOUNTER — Encounter (HOSPITAL_COMMUNITY): Payer: Self-pay

## 2015-07-28 DIAGNOSIS — E119 Type 2 diabetes mellitus without complications: Secondary | ICD-10-CM | POA: Insufficient documentation

## 2015-07-28 DIAGNOSIS — M79672 Pain in left foot: Secondary | ICD-10-CM | POA: Insufficient documentation

## 2015-07-28 DIAGNOSIS — Y929 Unspecified place or not applicable: Secondary | ICD-10-CM | POA: Insufficient documentation

## 2015-07-28 DIAGNOSIS — W108XXA Fall (on) (from) other stairs and steps, initial encounter: Secondary | ICD-10-CM | POA: Insufficient documentation

## 2015-07-28 DIAGNOSIS — Z7984 Long term (current) use of oral hypoglycemic drugs: Secondary | ICD-10-CM | POA: Insufficient documentation

## 2015-07-28 DIAGNOSIS — I10 Essential (primary) hypertension: Secondary | ICD-10-CM | POA: Insufficient documentation

## 2015-07-28 DIAGNOSIS — Y999 Unspecified external cause status: Secondary | ICD-10-CM | POA: Insufficient documentation

## 2015-07-28 DIAGNOSIS — Y939 Activity, unspecified: Secondary | ICD-10-CM | POA: Insufficient documentation

## 2015-07-28 DIAGNOSIS — F17210 Nicotine dependence, cigarettes, uncomplicated: Secondary | ICD-10-CM | POA: Insufficient documentation

## 2015-07-28 MED ORDER — NAPROXEN 500 MG PO TABS: 500.0000 mg | ORAL_TABLET | Freq: Two times a day (BID) | ORAL | Status: DC

## 2015-07-28 NOTE — Discharge Instructions (Signed)
Take the prescribed medication as directed.  Recommend to ice and elevate foot at home to help with pain/swelling. Follow-up with your primary care doctor. Return to the ED for new or worsening symptoms.

## 2015-07-28 NOTE — ED Notes (Signed)
Pt slipped on the steps Saturday night ans injured her left foot and now her toes are blue and the pain is going through her ankle

## 2015-07-28 NOTE — ED Provider Notes (Signed)
CSN: [id number]     Arrival date & time 07/28/15  1932 History   First MD Initiated Contact with Patient 07/28/15 2021     Chief Complaint  Patient presents with  . Foot Pain     (Consider location/radiation/quality/duration/timing/severity/associated sxs/prior Treatment) Patient is a 50 y.o. female presenting with lower extremity pain. The history is provided by the patient and medical records.  Foot Pain Associated symptoms include arthralgias.    50 year old female with history of diabetes, hypertension, chronic back pain, presenting to the ED for left foot injury. She states she slipped on some water Saturday night and her toes bent backwards. She denies any head injury loss of consciousness. She states since this time she has had increased pain and swelling to her left foot and now her toes are bruised. States she remains ambulatory but she has been walking on her heels as it hurts too much to bear weight on her toes. Denies any numbness or weakness of her left leg or foot. No prior left foot injuries or surgeries in the past.  No meds taken prior to arrival.  Past Medical History  Diagnosis Date  . Diabetes mellitus   . Hypertension   . Chronic back pain    Past Surgical History  Procedure Laterality Date  . Cholecystectomy    . Abdominal hysterectomy     History reviewed. No pertinent family history. Social History  Substance Use Topics  . Smoking status: Current Every Day Smoker -- 1.00 packs/day    Types: Cigarettes  . Smokeless tobacco: None  . Alcohol Use: No   OB History    No data available     Review of Systems  Musculoskeletal: Positive for arthralgias.  All other systems reviewed and are negative.     Allergies  Codeine; Lisinopril; and Sulfonamide derivatives  Home Medications   Prior to Admission medications   Medication Sig Start Date End Date Taking? Authorizing Provider  glimepiride (AMARYL) 2 MG tablet Take 2 mg by mouth every morning.     Historical Provider, MD  ibuprofen (ADVIL,MOTRIN) 200 MG tablet Take 400 mg by mouth every 6 (six) hours as needed for pain.     Historical Provider, MD  metFORMIN (GLUMETZA) 1000 MG (MOD) 24 hr tablet Take 1,000 mg by mouth 2 (two) times daily with a meal.     Historical Provider, MD   BP 166/67 mmHg  Pulse 93  Temp(Src) 98.5 F (36.9 C) (Oral)  SpO2 95%   Physical Exam  Constitutional: She is oriented to person, place, and time. She appears well-developed and well-nourished. No distress.  HENT:  Head: Normocephalic and atraumatic.  Mouth/Throat: Oropharynx is clear and moist.  Eyes: Conjunctivae and EOM are normal. Pupils are equal, round, and reactive to light.  Neck: Normal range of motion. Neck supple.  Cardiovascular: Normal rate, regular rhythm and normal heart sounds.   Pulmonary/Chest: Effort normal and breath sounds normal. No respiratory distress. She has no wheezes.  Abdominal: Soft. Bowel sounds are normal. There is no tenderness. There is no guarding.  Musculoskeletal: Normal range of motion.  Swelling and bruising noted to volar aspect of left great and second toes; no gross deformities; DP pulse intact; moving toes normally; normal sensation throughout foot; ambulatory, walking on left heel  Neurological: She is alert and oriented to person, place, and time.  Skin: Skin is warm and dry. She is not diaphoretic.  Psychiatric: She has a normal mood and affect.  Nursing note and vitals reviewed.  ED Course  Procedures (including critical care time) Labs Review Labs Reviewed - No data to display  Imaging Review Dg Foot Complete Left  07/28/2015  CLINICAL DATA:  Status post fall 3 days ago with left foot pain. EXAM: LEFT FOOT - COMPLETE 3+ VIEW COMPARISON:  None. FINDINGS: There is no evidence of fracture or dislocation. Plantar calcaneal spur is identified. Chronic changes noted near the distal second metatarsal. Soft tissues are unremarkable. IMPRESSION: No acute  fracture or dislocation. Electronically Signed   By: Sherian Rein M.D.   On: 07/28/2015 20:07   I have personally reviewed and evaluated these images and lab results as part of my medical decision-making.   EKG Interpretation None      MDM   Final diagnoses:  Left foot pain   50 year old female here with left foot injury over the weekend. No head injury or loss of consciousness. She does have some swelling and bruising noted to her left great and second toes. She has no bony deformities. Her foot is neurovascularly intact. X-ray negative for acute bony findings. Placed in postop shoe for comfort. Discharge home with Naprosyn. Encouraged ice and elevate foot to help with pain and swelling. Follow-up with PCP.  Discussed plan with patient, he/she acknowledged understanding and agreed with plan of care.  Return precautions given for new or worsening symptoms.  Garlon Hatchet, PA-C 07/28/15 2036  Maia Plan, MD 07/29/15 [phone]

## 2017-01-18 ENCOUNTER — Other Ambulatory Visit: Payer: Self-pay | Admitting: Internal Medicine

## 2017-01-18 DIAGNOSIS — Z139 Encounter for screening, unspecified: Secondary | ICD-10-CM

## 2017-02-07 ENCOUNTER — Ambulatory Visit: Payer: Self-pay

## 2017-05-01 ENCOUNTER — Other Ambulatory Visit: Payer: Self-pay | Admitting: Internal Medicine

## 2017-05-01 DIAGNOSIS — R7989 Other specified abnormal findings of blood chemistry: Secondary | ICD-10-CM

## 2017-05-01 DIAGNOSIS — R945 Abnormal results of liver function studies: Secondary | ICD-10-CM

## 2017-05-11 ENCOUNTER — Other Ambulatory Visit: Payer: Self-pay

## 2017-05-18 ENCOUNTER — Inpatient Hospital Stay
Admission: RE | Admit: 2017-05-18 | Discharge: 2017-05-18 | Disposition: A | Discharge status: Home / Self Care | Payer: Self-pay | Source: Ambulatory Visit | Attending: Internal Medicine | Admitting: Internal Medicine

## 2017-08-07 ENCOUNTER — Emergency Department (HOSPITAL_COMMUNITY): Payer: BLUE CROSS/BLUE SHIELD

## 2017-08-07 ENCOUNTER — Encounter (HOSPITAL_COMMUNITY): Payer: Self-pay | Admitting: Emergency Medicine

## 2017-08-07 ENCOUNTER — Emergency Department (HOSPITAL_COMMUNITY)
Admission: EM | Admit: 2017-08-07 | Discharge: 2017-08-07 | Disposition: A | Discharge status: Home / Self Care | Payer: BLUE CROSS/BLUE SHIELD | Attending: Emergency Medicine | Admitting: Emergency Medicine

## 2017-08-07 DIAGNOSIS — Y999 Unspecified external cause status: Secondary | ICD-10-CM | POA: Insufficient documentation

## 2017-08-07 DIAGNOSIS — S93402A Sprain of unspecified ligament of left ankle, initial encounter: Secondary | ICD-10-CM | POA: Insufficient documentation

## 2017-08-07 DIAGNOSIS — Y929 Unspecified place or not applicable: Secondary | ICD-10-CM | POA: Insufficient documentation

## 2017-08-07 DIAGNOSIS — Z79899 Other long term (current) drug therapy: Secondary | ICD-10-CM | POA: Insufficient documentation

## 2017-08-07 DIAGNOSIS — E119 Type 2 diabetes mellitus without complications: Secondary | ICD-10-CM | POA: Diagnosis not present

## 2017-08-07 DIAGNOSIS — S99812A Other specified injuries of left ankle, initial encounter: Secondary | ICD-10-CM | POA: Diagnosis present

## 2017-08-07 DIAGNOSIS — Y939 Activity, unspecified: Secondary | ICD-10-CM | POA: Diagnosis not present

## 2017-08-07 DIAGNOSIS — X500XXA Overexertion from strenuous movement or load, initial encounter: Secondary | ICD-10-CM | POA: Diagnosis not present

## 2017-08-07 DIAGNOSIS — I10 Essential (primary) hypertension: Secondary | ICD-10-CM | POA: Diagnosis not present

## 2017-08-07 DIAGNOSIS — F17210 Nicotine dependence, cigarettes, uncomplicated: Secondary | ICD-10-CM | POA: Diagnosis not present

## 2017-08-07 MED ORDER — HYDROCODONE-ACETAMINOPHEN 5-325 MG PO TABS
1.0000 | ORAL_TABLET | Freq: Once | ORAL | Status: DC
  Filled 2017-08-07: qty 1

## 2017-08-07 MED ORDER — DICLOFENAC SODIUM 50 MG PO TBEC: 50.0000 mg | DELAYED_RELEASE_TABLET | Freq: Two times a day (BID) | ORAL | 0 refills | Status: DC

## 2017-08-07 NOTE — ED Provider Notes (Signed)
[facility]   CSN: [id number] Arrival date & time: 08/07/17  1458     History   Chief Complaint Chief Complaint  Patient presents with  . Ankle Pain    HPI Mary Campbell is a 52 y.o. female who presents to the ED with ankle pain. The pain is located in the left ankle. Patient reports the pain started a couple weeks ago after she twisted her ankle when she stepped the wrong way. 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   The history is provided by the patient. No language interpreter was used.  Ankle Pain   The incident occurred more than 1 week ago. The incident occurred at home. The pain is present in the left ankle. The pain is moderate. The symptoms are aggravated by bearing weight.    Past Medical History:  Diagnosis Date  . Chronic back pain   . Diabetes mellitus   . Hypertension     There are no active problems to display for this patient.   Past Surgical History:  Procedure Laterality Date  . ABDOMINAL HYSTERECTOMY    . CHOLECYSTECTOMY       OB History   None      Home Medications    Prior to Admission medications   Medication Sig Start Date End Date Taking? Authorizing Provider  diclofenac (VOLTAREN) 50 MG EC tablet Take 1 tablet (50 mg total) by mouth 2 (two) times daily. 08/07/17   Janne NapoleonNeese, Jeno Calleros M, NP  glimepiride (AMARYL) 2 MG tablet Take 2 mg by mouth every morning.    [provider]  ibuprofen (ADVIL,MOTRIN) 200 MG tablet Take 400 mg by mouth every 6 (six) hours as needed for pain.     [provider]  metFORMIN (GLUMETZA) 1000 MG (MOD) 24 hr tablet Take 1,000 mg by mouth 2 (two) times daily with a meal.     [provider]  naproxen (NAPROSYN) 500 MG tablet Take 1 tablet (500 mg total) by mouth 2 (two) times daily with a meal. 07/28/15   Garlon HatchetSanders, Lisa M, PA-C    Family History No family history on  file.  Social History Social History   Tobacco Use  . Smoking status: Current Every Day Smoker    Packs/day: 1.00    Types: Cigarettes  Substance Use Topics  . Alcohol use: No  . Drug use: No     Allergies   Codeine; Lisinopril; and Sulfonamide derivatives   Review of Systems Review of Systems  Musculoskeletal: Positive for arthralgias.       Left ankle pain  All other systems reviewed and are negative.    Physical Exam Updated Vital Signs BP (!) 191/79 (BP Location: Left Arm)   Pulse 79   Temp 98.4 F (36.9 C) (Oral)   Resp 19   SpO2 99%   Physical Exam  Constitutional: She appears well-developed and well-nourished. No distress.  HENT:  Head: Normocephalic.  Eyes: EOM are normal.  Neck: Neck supple.  Cardiovascular: Normal rate.  Pulmonary/Chest: Effort normal.  Musculoskeletal:       Left ankle: She exhibits swelling and ecchymosis. She exhibits no laceration and normal pulse. Decreased range of motion: due to pain. Tenderness. Lateral malleolus and medial malleolus tenderness found.  Pedal pulses 2+, adequate circulation  Neurological: She is alert.  Skin: Skin is warm and dry.  Psychiatric: She has a  normal mood and affect.  Nursing note and vitals reviewed.    ED Treatments / Results  Labs (all labs ordered are listed, but only abnormal results are displayed) Labs Reviewed - No data to display Radiology Dg Ankle Complete Left  Result Date: 08/07/2017 CLINICAL DATA:  Ankle twisting injury EXAM: LEFT ANKLE COMPLETE - 3+ VIEW COMPARISON:  Left foot radiograph 07/28/2015 FINDINGS: Large amount of posterior ankle soft tissue swelling and effusion. No acute fracture. Ankle mortise is approximated. There are small plantar and posterior calcaneal enthesophytes. IMPRESSION: Posterior ankle soft tissue swelling and effusion, likely indicating underlying tendinous or ligamentous injury. No acute fracture or dislocation. Electronically Signed   By: Deatra Robinson  M.D.   On: 08/07/2017 15:46    Procedures Procedures (including critical care time)  Medications Ordered in ED Medications  HYDROcodone-acetaminophen (NORCO/VICODIN) 5-325 MG per tablet 1 tablet (has no administration in time range)     Initial Impression / Assessment and Plan / ED Course  I have reviewed the triage vital signs and the nursing notes. 52 y.o. female with left ankle pain s/p injury 2 weeks ago stable for d/c without focal neuro deficits. ASO, crutches, ice, elevation, NSAIDS and ortho referral. Patient agrees with plan. Return precautions discussed.   Final Clinical Impressions(s) / ED Diagnoses   Final diagnoses:  Sprain of left ankle, unspecified ligament, initial encounter    ED Discharge Orders        Ordered    diclofenac (VOLTAREN) 50 MG EC tablet  2 times daily     08/07/17 [address] [place], [place] 08/07/17 1619    Dione Booze, MD 08/07/17 [phone]

## 2017-08-07 NOTE — ED Triage Notes (Signed)
Pt c/o left ankle pain for couple weeks after twisting ankle when stepped wrong off step.

## 2017-08-07 NOTE — Discharge Instructions (Signed)
Follow up with Dr. Lequita HaltAluisio. Return as needed.

## 2017-09-14 ENCOUNTER — Other Ambulatory Visit: Payer: Self-pay | Admitting: Internal Medicine

## 2017-09-14 DIAGNOSIS — E119 Type 2 diabetes mellitus without complications: Secondary | ICD-10-CM

## 2017-09-14 DIAGNOSIS — M79606 Pain in leg, unspecified: Secondary | ICD-10-CM

## 2017-09-20 ENCOUNTER — Other Ambulatory Visit: Payer: BLUE CROSS/BLUE SHIELD

## 2018-03-14 ENCOUNTER — Emergency Department (HOSPITAL_COMMUNITY): Payer: No Typology Code available for payment source

## 2018-03-14 ENCOUNTER — Emergency Department (HOSPITAL_COMMUNITY)
Admission: EM | Admit: 2018-03-14 | Discharge: 2018-03-14 | Disposition: A | Discharge status: Home / Self Care | Payer: No Typology Code available for payment source | Attending: Emergency Medicine | Admitting: Emergency Medicine

## 2018-03-14 ENCOUNTER — Encounter (HOSPITAL_COMMUNITY): Payer: Self-pay | Admitting: Emergency Medicine

## 2018-03-14 ENCOUNTER — Other Ambulatory Visit: Payer: Self-pay

## 2018-03-14 DIAGNOSIS — Y929 Unspecified place or not applicable: Secondary | ICD-10-CM | POA: Insufficient documentation

## 2018-03-14 DIAGNOSIS — S99912A Unspecified injury of left ankle, initial encounter: Secondary | ICD-10-CM | POA: Diagnosis present

## 2018-03-14 DIAGNOSIS — W228XXA Striking against or struck by other objects, initial encounter: Secondary | ICD-10-CM | POA: Insufficient documentation

## 2018-03-14 DIAGNOSIS — Z9049 Acquired absence of other specified parts of digestive tract: Secondary | ICD-10-CM | POA: Diagnosis not present

## 2018-03-14 DIAGNOSIS — F17210 Nicotine dependence, cigarettes, uncomplicated: Secondary | ICD-10-CM | POA: Diagnosis not present

## 2018-03-14 DIAGNOSIS — I10 Essential (primary) hypertension: Secondary | ICD-10-CM | POA: Diagnosis not present

## 2018-03-14 DIAGNOSIS — Y9389 Activity, other specified: Secondary | ICD-10-CM | POA: Insufficient documentation

## 2018-03-14 DIAGNOSIS — Z79899 Other long term (current) drug therapy: Secondary | ICD-10-CM | POA: Insufficient documentation

## 2018-03-14 DIAGNOSIS — Z7984 Long term (current) use of oral hypoglycemic drugs: Secondary | ICD-10-CM | POA: Diagnosis not present

## 2018-03-14 DIAGNOSIS — E119 Type 2 diabetes mellitus without complications: Secondary | ICD-10-CM | POA: Insufficient documentation

## 2018-03-14 DIAGNOSIS — Y990 Civilian activity done for income or pay: Secondary | ICD-10-CM | POA: Insufficient documentation

## 2018-03-14 DIAGNOSIS — S9002XA Contusion of left ankle, initial encounter: Secondary | ICD-10-CM | POA: Insufficient documentation

## 2018-03-14 NOTE — ED Provider Notes (Signed)
[facility]PT Provider Note   CSN: [id number] Arrival date & time: 03/14/18  2112    History   Chief Complaint Chief Complaint  Patient presents with  . Leg Pain    HPI Mary Campbell is a 53 y.o. female.     HPI   Patient is a 53 year old female with a history of type 2 diabetes mellitus, hypertension presenting for left ankle injury.  Patient reports that she was at work last night when she struck her medial left ankle against a pallet.  She works in a [employer].  Patient reports that she had some immediate pain and swelling, but was able to walk on it.  Denies any weakness or numbness distally.  Today, she reports she is had progressive pain with ambulation.  She reports that her work sent her over here.  Past Medical History:  Diagnosis Date  . Chronic back pain   . Diabetes mellitus   . Hypertension     There are no active problems to display for this patient.   Past Surgical History:  Procedure Laterality Date  . ABDOMINAL HYSTERECTOMY    . CHOLECYSTECTOMY       OB History   No obstetric history on file.      Home Medications    Prior to Admission medications   Medication Sig Start Date End Date Taking? Authorizing Provider  diclofenac (VOLTAREN) 50 MG EC tablet Take 1 tablet (50 mg total) by mouth 2 (two) times daily. 08/07/17   Janne Napoleon, NP  glimepiride (AMARYL) 2 MG tablet Take 2 mg by mouth every morning.    [provider]  ibuprofen (ADVIL,MOTRIN) 200 MG tablet Take 400 mg by mouth every 6 (six) hours as needed for pain.     [provider]  metFORMIN (GLUMETZA) 1000 MG (MOD) 24 hr tablet Take 1,000 mg by mouth 2 (two) times daily with a meal.     [provider]  naproxen (NAPROSYN) 500 MG tablet Take 1 tablet (500 mg total) by mouth 2 (two) times daily with a meal. 07/28/15   Garlon Hatchet, PA-C    Family History History reviewed. No pertinent family history.  Social  History Social History   Tobacco Use  . Smoking status: Current Every Day Smoker    Packs/day: 1.00    Types: Cigarettes  . Smokeless tobacco: Never Used  Substance Use Topics  . Alcohol use: No  . Drug use: No     Allergies   Codeine; Lisinopril; and Sulfonamide derivatives   Review of Systems Review of Systems  Musculoskeletal: Positive for arthralgias and myalgias.  Skin: Negative for wound.  Neurological: Negative for weakness and numbness.     Physical Exam Updated Vital Signs BP (!) 187/79 (BP Location: Left Arm)   Pulse 84   Temp 99.3 F (37.4 C) (Oral)   Resp 16   Ht 5\' 5"  (1.651 m)   Wt 80.7 kg   SpO2 100%   BMI 29.62 kg/m   Physical Exam Vitals signs and nursing note reviewed.  Constitutional:      General: She is not in acute distress.    Appearance: She is well-developed. She is not diaphoretic.     Comments: Sitting comfortably in bed.  HENT:     Head: Normocephalic and atraumatic.  Eyes:     General:        Right eye: No discharge.        Left eye: No discharge.  Conjunctiva/sclera: Conjunctivae normal.     Comments: EOMs normal to gross examination.  Neck:     Musculoskeletal: Normal range of motion.  Cardiovascular:     Rate and Rhythm: Normal rate and regular rhythm.     Comments: Intact, 2+ DP pulse. Pulmonary:     Comments: Patient converses comfortably without audible wheeze or stridor. Abdominal:     General: There is no distension.  Musculoskeletal:     Comments: Left ankle with tenderness to palpation of medial left ankle just distal to the medial malleolus. Minor soft tissue swelling.  Full ROM. No erythema, ecchymosis, or deformity appreciated. No break in skin. No pain to fifth metatarsal area or navicular region. Achilles intact per Thompson's test. Good pedal pulse and cap refill of toes. Sensation intact to light touch distally.   Skin:    General: Skin is warm and dry.  Neurological:     Mental Status: She is alert.      Comments: Cranial nerves intact to gross observation. Patient moves extremities without difficulty.  Psychiatric:        Behavior: Behavior normal.        Thought Content: Thought content normal.        Judgment: Judgment normal.      ED Treatments / Results  Labs (all labs ordered are listed, but only abnormal results are displayed) Labs Reviewed - No data to display  EKG None  Radiology Dg Ankle Complete Left  Result Date: 03/14/2018 CLINICAL DATA:  Injury to the left medial ankle. EXAM: LEFT ANKLE COMPLETE - 3+ VIEW COMPARISON:  08/07/2017 FINDINGS: There is no evidence of fracture, dislocation, or joint effusion. There is no evidence of arthropathy or other focal bone abnormality. Soft tissues are unremarkable. Calcaneal spurs. IMPRESSION: No acute bony abnormalities. Electronically Signed   By: Burman Nieves M.D.   On: 03/14/2018 22:27    Procedures Procedures (including critical care time)  Medications Ordered in ED Medications - No data to display   Initial Impression / Assessment and Plan / ED Course  I have reviewed the triage vital signs and the nursing notes.  Pertinent labs & imaging results that were available during my care of the patient were reviewed by me and considered in my medical decision making (see chart for details).  Clinical Course as of Mar 14 2248  Wed Mar 14, 2018  2249 Blood pressure rechecked. Patient did not take her home blood pressure medication.   BP(!): 187/79 [AM]    Clinical Course User Index [AM] Elisha Ponder, PA-C       Patient well-appearing and in no acute distress.  Patient neurovascular intact in the left lower extremity.  Suspect contusion.  Radiograph is negative for fracture.  Placed patient in ASO splint.  Recommended weightbearing as tolerated, RICE therapy.  Encouraged Tylenol instead of NSAID use as well as topical therapies given patient's hypertension.  Patient incidentally found to have elevated blood  pressure today.  She has hypertension baseline, did not take her medication today.  She is without any symptoms suggestive of hypertensive urgency or emergency.  Final Clinical Impressions(s) / ED Diagnoses   Final diagnoses:  Contusion of left ankle, initial encounter    ED Discharge Orders    None       Delia Chimes 03/14/18 2307    Derwood Kaplan, MD 03/15/18 [phone]

## 2018-03-14 NOTE — Discharge Instructions (Signed)
Please see the information and instructions below regarding your visit.  Your diagnoses today include:  1. Contusion of left ankle, initial encounter    Your provider has diagnosed you as suffering from an ankle sprain. Ankle sprain occurs when the ligaments that hold the ankle joint together are stretched or torn. It may take 4 to 6 weeks to heal.  Tests performed today include: An x-ray of your ankle - does NOT show any broken bones  See side panel of your discharge paperwork for testing performed today. Vital signs are listed at the bottom of these instructions.   Medications prescribed:  Take any prescribed medications only as prescribed, and any over the counter medications only as directed on the packaging.  I recommend Tylenol, 600 mg every 6 hours as needed for pain.  You can also try over-the-counter  Arnica gel.  Home care instructions:  Follow R.I.C.E. Protocol: R - rest your injury  I  - use ice on injury without applying directly to skin C - compress injury with bandage or splint E - elevate the injury as much as possible  For Activity: Wear ankle brace for at least 2 weeks for stabilization of ankle. If prescribed crutches, use crutches with non-weight bearing for the first few days. Then, you may walk on your ankle as the pain allows, or as instructed. Start gradually with weight bearing on the affected ankle. Once you can walk pain free, then try jogging. When you can run forwards, then you can try moving side-to-side. If you cannot walk without crutches in one week, you need a re-check.  Please follow any educational materials contained in this packet.   Follow-up instructions: Please follow-up with your primary care provider or the provided orthopedic (bone specialist) listed in this packet if you continue to have significant pain or trouble walking in 1 week. In this case you may have a severe sprain that requires further care.   Return instructions:  Please return  if your toes are numb or tingling, appear gray or blue, are much colder than your other foot, or you have severe pain (also elevate leg and loosen splint or wrap). Please return to the Emergency Department if you experience worsening symptoms.  Please return if you have any other emergent concerns.  Additional Information:   Your vital signs today were: BP (!) 187/79 (BP Location: Left Arm)    Pulse 84    Temp 99.3 F (37.4 C) (Oral)    Resp 16    Ht 5\' 5"  (1.651 m)    Wt 80.7 kg    SpO2 100%    BMI 29.62 kg/m  If your blood pressure (BP) was elevated on multiple readings during this visit above 130 for the top number or above 80 for the bottom number, please have this repeated by your primary care provider within one month. --------------  Thank you for allowing [place] to participate in your care today.

## 2018-03-14 NOTE — ED Triage Notes (Signed)
Suffered a striking injury tho the left medial ankle.

## 2018-08-15 ENCOUNTER — Emergency Department (HOSPITAL_COMMUNITY)
Admission: EM | Admit: 2018-08-15 | Discharge: 2018-08-15 | Disposition: A | Discharge status: Home / Self Care | Payer: BC Managed Care – PPO | Attending: Emergency Medicine | Admitting: Emergency Medicine

## 2018-08-15 ENCOUNTER — Emergency Department (HOSPITAL_COMMUNITY): Payer: BC Managed Care – PPO

## 2018-08-15 ENCOUNTER — Other Ambulatory Visit: Payer: Self-pay

## 2018-08-15 ENCOUNTER — Encounter (HOSPITAL_COMMUNITY): Payer: Self-pay | Admitting: Emergency Medicine

## 2018-08-15 DIAGNOSIS — Z7984 Long term (current) use of oral hypoglycemic drugs: Secondary | ICD-10-CM | POA: Insufficient documentation

## 2018-08-15 DIAGNOSIS — R0602 Shortness of breath: Secondary | ICD-10-CM | POA: Insufficient documentation

## 2018-08-15 DIAGNOSIS — Z79899 Other long term (current) drug therapy: Secondary | ICD-10-CM | POA: Insufficient documentation

## 2018-08-15 DIAGNOSIS — R0789 Other chest pain: Secondary | ICD-10-CM | POA: Diagnosis not present

## 2018-08-15 DIAGNOSIS — R5383 Other fatigue: Secondary | ICD-10-CM | POA: Insufficient documentation

## 2018-08-15 DIAGNOSIS — I10 Essential (primary) hypertension: Secondary | ICD-10-CM | POA: Diagnosis not present

## 2018-08-15 DIAGNOSIS — F17210 Nicotine dependence, cigarettes, uncomplicated: Secondary | ICD-10-CM | POA: Diagnosis not present

## 2018-08-15 DIAGNOSIS — E119 Type 2 diabetes mellitus without complications: Secondary | ICD-10-CM | POA: Insufficient documentation

## 2018-08-15 DIAGNOSIS — Z733 Stress, not elsewhere classified: Secondary | ICD-10-CM | POA: Insufficient documentation

## 2018-08-15 DIAGNOSIS — R079 Chest pain, unspecified: Secondary | ICD-10-CM

## 2018-08-15 DIAGNOSIS — R42 Dizziness and giddiness: Secondary | ICD-10-CM | POA: Diagnosis not present

## 2018-08-15 LAB — I-STAT BETA HCG BLOOD, ED (NOT ORDERABLE): I-stat hCG, quantitative: 7.3 m[IU]/mL — ABNORMAL HIGH (ref <5)

## 2018-08-15 LAB — CBC
HCT: 39.9 % (ref 36.0–46.0)
Hemoglobin: 13.2 g/dL (ref 12.0–15.0)
MCH: 29.5 pg (ref 26.0–34.0)
MCHC: 33.1 g/dL (ref 30.0–36.0)
MCV: 89.1 fL (ref 80.0–100.0)
Platelets: 291 10*3/uL (ref 150–400)
RBC: 4.48 MIL/uL (ref 3.87–5.11)
RDW: 15 % (ref 11.5–15.5)
WBC: 11.1 10*3/uL — ABNORMAL HIGH (ref 4.0–10.5)
nRBC: 0 % (ref 0.0–0.2)

## 2018-08-15 LAB — TROPONIN I (HIGH SENSITIVITY)
Troponin I (High Sensitivity): 4 ng/L (ref <18)
Troponin I (High Sensitivity): 4 ng/L (ref <18)
Troponin I (High Sensitivity): 5 ng/L (ref <18)

## 2018-08-15 LAB — BASIC METABOLIC PANEL
Anion gap: 10 (ref 5–15)
BUN: 13 mg/dL (ref 6–20)
CO2: 27 mmol/L (ref 22–32)
Calcium: 9.1 mg/dL (ref 8.9–10.3)
Chloride: 99 mmol/L (ref 98–111)
Creatinine, Ser: 1.02 mg/dL — ABNORMAL HIGH (ref 0.44–1.00)
GFR calc Af Amer: >60 mL/min (ref >60)
GFR calc non Af Amer: >60 mL/min (ref >60)
Glucose, Bld: 279 mg/dL — ABNORMAL HIGH (ref 70–99)
Potassium: 3.8 mmol/L (ref 3.5–5.1)
Sodium: 136 mmol/L (ref 135–145)

## 2018-08-15 MED ORDER — SODIUM CHLORIDE 0.9 % IV BOLUS
500.0000 mL | Freq: Once | INTRAVENOUS | Status: AC
  Administered 2018-08-15: 11:00:00 500 mL via INTRAVENOUS

## 2018-08-15 MED ORDER — FENTANYL CITRATE (PF) 100 MCG/2ML IJ SOLN
50.0000 ug | Freq: Once | INTRAMUSCULAR | Status: DC
  Filled 2018-08-15: qty 2

## 2018-08-15 MED ORDER — SODIUM CHLORIDE 0.9 % IV BOLUS: 1000.0000 mL | Freq: Once | INTRAVENOUS | Status: DC

## 2018-08-15 MED ORDER — ASPIRIN 325 MG PO TABS
325.0000 mg | ORAL_TABLET | Freq: Every day | ORAL | Status: DC
  Administered 2018-08-15: 325 mg via ORAL
  Filled 2018-08-15: qty 1

## 2018-08-15 MED ORDER — ACETAMINOPHEN 325 MG PO TABS
650.0000 mg | ORAL_TABLET | Freq: Once | ORAL | Status: AC
  Administered 2018-08-15: 12:00:00 650 mg via ORAL
  Filled 2018-08-15: qty 2

## 2018-08-15 MED ORDER — HYDROXYZINE HCL 25 MG PO TABS: 25.0000 mg | ORAL_TABLET | Freq: Three times a day (TID) | ORAL | 0 refills | Status: DC | PRN

## 2018-08-15 NOTE — ED Triage Notes (Signed)
Pt c/o chest pains with dizziness that started around 630 this morning.

## 2018-08-15 NOTE — Discharge Instructions (Signed)
You were seen in the emergency department today for chest pain. Your work-up in the emergency department has been overall reassuring. Your labs have been fairly normal and or similar to previous blood work you have had done-your blood sugar was elevated, please be sure to monitor this at home and has it rechecked by primary care.  Your blood pressure was also elevated, be sure to keep monitoring this at home and taking her medications as prescribed.. Your EKG and the enzyme we use to check your heart did not show an acute heart attack at this time. Your chest x-ray was normal-with the exception that there was an abnormality that could be due to a nipple shadow versus a pulmonary nodule, you will need a repeat chest x-ray in 1 to 2 weeks for reassessment of this area. We are sending you home with Atarax to take as needed for anxiety.  Please not take this prior to driving or operating heavy machinery as it can make you sleepy. We have prescribed you new medication(s) today. Discuss the medications prescribed today with your pharmacist as they can have adverse effects and interactions with your other medicines including over the counter and prescribed medications. Seek medical evaluation if you start to experience new or abnormal symptoms after taking one of these medicines, seek care immediately if you start to experience difficulty breathing, feeling of your throat closing, facial swelling, or rash as these could be indications of a more serious allergic reaction  We would like you to follow up closely with your primary care provider and/or the cardiologist provided in your discharge instructions within 1-3 days. Return to the ER immediately should you experience any new or worsening symptoms including but not limited to return of pain, worsened pain, vomiting, shortness of breath, dizziness, lightheadedness, passing out, or any other concerns that you may have.

## 2018-08-15 NOTE — ED Provider Notes (Signed)
[facility] DEPT Provider Note   CSN: [id number] Arrival date & time: 08/15/18  6295     History   Chief Complaint Chief Complaint  Patient presents with  . Chest Pain    HPI Mary Campbell is a 53 y.o. female with a hx of tobacco abuse, HTN, DM, chronic back pain, & prior hysterectomy/cholecystectomy who presents to the ED w/ complaints of chest discomfort that began @ 06:30 this AM and has been occurring intermittently since onset. Patient describes the pain as a pressure to the substernal area, currently a 6/10 in severity without significant alleviating/aggravating factors. She states there is not significant change w/ exertion or deep breath.. She states pain started while she was getting dressed. She has had associated lightheadedness, & at one point felt a bit short of breath. She states she has had sxs like this previously with increased stress & has had a lot going on at work recently, but was not acutely stressed or anxious this morning prior to onset of sxs. She has had poor quality sleep recently, states she is exhausted. Denies leg pain/swelling, hemoptysis, recent surgery/trauma, recent long travel, hormone use, personal hx of cancer, or hx of DVT/PE. Denies early family hx of CAD. Denies syncope, nausea, or vomiting. She has not had her BP medicine today- recent med change by PCP within past 2 weeks for elevated BP.     HPI  Past Medical History:  Diagnosis Date  . Chronic back pain   . Diabetes mellitus   . Hypertension     There are no active problems to display for this patient.   Past Surgical History:  Procedure Laterality Date  . ABDOMINAL HYSTERECTOMY    . CHOLECYSTECTOMY       OB History   No obstetric history on file.      Home Medications    Prior to Admission medications   Medication Sig Start Date End Date Taking? Authorizing Provider  diclofenac (VOLTAREN) 50 MG EC tablet Take 1 tablet (50 mg total) by mouth 2  (two) times daily. 08/07/17   Ashley Murrain, NP  glimepiride (AMARYL) 2 MG tablet Take 2 mg by mouth every morning.    [provider]  ibuprofen (ADVIL,MOTRIN) 200 MG tablet Take 400 mg by mouth every 6 (six) hours as needed for pain.     [provider]  metFORMIN (GLUMETZA) 1000 MG (MOD) 24 hr tablet Take 1,000 mg by mouth 2 (two) times daily with a meal.     [provider]  naproxen (NAPROSYN) 500 MG tablet Take 1 tablet (500 mg total) by mouth 2 (two) times daily with a meal. 07/28/15   Larene Pickett, PA-C    Family History No family history on file.  Social History Social History   Tobacco Use  . Smoking status: Current Every Day Smoker    Packs/day: 1.00    Types: Cigarettes  . Smokeless tobacco: Never Used  Substance Use Topics  . Alcohol use: No  . Drug use: No     Allergies   Codeine, Lisinopril, and Sulfonamide derivatives   Review of Systems Review of Systems  Constitutional: Negative for chills and fever.  Respiratory: Positive for shortness of breath.   Cardiovascular: Positive for chest pain. Negative for palpitations and leg swelling.  Gastrointestinal: Negative for abdominal pain, nausea and vomiting.  Genitourinary: Negative for dysuria.  Neurological: Positive for light-headedness. Negative for syncope, speech difficulty, weakness and numbness.  Psychiatric/Behavioral: Negative for hallucinations  and suicidal ideas.       + for increased stress.   All other systems reviewed and are negative.  Physical Exam Updated Vital Signs BP (!) 179/87 (BP Location: Right Arm)   Pulse (!) 101   Temp 98.5 F (36.9 C) (Oral)   Resp 20   SpO2 98%   Physical Exam Vitals signs and nursing note reviewed.  Constitutional:      General: She is not in acute distress.    Appearance: She is well-developed. She is not toxic-appearing.  HENT:     Head: Normocephalic and atraumatic.  Eyes:     General:        Right eye: No discharge.         Left eye: No discharge.     Extraocular Movements: Extraocular movements intact.     Conjunctiva/sclera: Conjunctivae normal.     Pupils: Pupils are equal, round, and reactive to light.  Neck:     Musculoskeletal: Neck supple.  Cardiovascular:     Rate and Rhythm: Normal rate and regular rhythm.     Pulses:          Radial pulses are 2+ on the right side and 2+ on the left side.       Dorsalis pedis pulses are 2+ on the right side and 2+ on the left side.  Pulmonary:     Effort: Pulmonary effort is normal. No respiratory distress.     Breath sounds: Normal breath sounds. No wheezing, rhonchi or rales.  Chest:     Chest wall: No tenderness.  Abdominal:     General: There is no distension.     Palpations: Abdomen is soft.     Tenderness: There is no abdominal tenderness.  Musculoskeletal:     Comments: Trace symmetric lower leg edema without overlying erythema/warmth or palpable calf tenderness.   Skin:    General: Skin is warm and dry.     Findings: No rash.  Neurological:     General: No focal deficit present.     Mental Status: She is alert.     Comments: Clear speech.  CN III through XII grossly intact.  Sensation grossly intact bilateral upper and lower extremities.  5 out of 5 symmetric grip strength.  5 out of 5 strength with plantar dorsiflexion bilaterally.  Negative pronator drift.  Normal finger-to-nose.  Ambulatory with steady gait.  Psychiatric:        Behavior: Behavior normal.      ED Treatments / Results  Labs (all labs ordered are listed, but only abnormal results are displayed) Labs Reviewed  BASIC METABOLIC PANEL - Abnormal; Notable for the following components:      Result Value   Glucose, Bld 279 (*)    Creatinine, Ser 1.02 (*)    All other components within normal limits  CBC - Abnormal; Notable for the following components:   WBC 11.1 (*)    All other components within normal limits  I-STAT BETA HCG BLOOD, ED (NOT ORDERABLE) - Abnormal; Notable  for the following components:   I-stat hCG, quantitative 7.3 (*)    All other components within normal limits  I-STAT BETA HCG BLOOD, ED ([facility], [facility], [facility] ONLY)  TROPONIN I (HIGH SENSITIVITY)  TROPONIN I (HIGH SENSITIVITY)  TROPONIN I (HIGH SENSITIVITY)    EKG EKG Interpretation  Date/Time:  Wednesday August 15 2018 08:23:59 EDT Ventricular Rate:  105 PR Interval:    QRS Duration: 146 QT Interval:  397 QTC Calculation: 525  R Axis:   21 Text Interpretation:  Sinus tachycardia Atrial premature complex Nonspecific intraventricular conduction delay Anteroseptal infarct, old Confirmed by Virgina NorfolkAdam, Curatolo [phone]) on 08/15/2018 8:42:28 AM Also confirmed by Virgina NorfolkAdam, Curatolo [phone]), editor Barbette Hairassel, Kerry [phone]([id number])  on 08/15/2018 9:07:13 AM   Radiology Dg Chest 2 View  Result Date: 08/15/2018 CLINICAL DATA:  Chest pain, dizziness EXAM: CHEST - 2 VIEW COMPARISON:  10/13/2012 FINDINGS: Nodular opacity projecting over the right lower lung likely reflecting a nipple shadow versus less likely a pulmonary nodule. There is no focal consolidation. There is no pleural effusion or pneumothorax. The heart and mediastinal contours are unremarkable. There is no acute osseous abnormality. IMPRESSION: No active cardiopulmonary disease. Nodular opacity projecting over the right lower lung likely reflecting a nipple shadow versus less likely a pulmonary nodule. Recommend repeat x-ray with nipple markers. Electronically Signed   By: Elige KoHetal  Patel   On: 08/15/2018 09:22    Procedures Procedures (including critical care time)  Medications Ordered in ED Medications  aspirin tablet 325 mg (325 mg Oral Given 08/15/18 1039)  fentaNYL (SUBLIMAZE) injection 50 mcg (50 mcg Intravenous Refused 08/15/18 1120)  sodium chloride 0.9 % bolus 500 mL (0 mLs Intravenous Stopped 08/15/18 1347)  acetaminophen (TYLENOL) tablet 650 mg (650 mg Oral Given 08/15/18 1149)   Initial Impression / Assessment and Plan / ED Course  I have reviewed the  triage vital signs and the nursing notes.  Pertinent labs & imaging results that were available during my care of the patient were reviewed by me and considered in my medical decision making (see chart for details).    Patient presents to the emergency department with chest pain. Patient nontoxic appearing, in no apparent distress, vitals without significant abnormality- BP elevated, lower suspicion for HTN emergency- has not had her anti-hypertensive medicine today, tachycardia normalized. . Fairly benign physical exam. DDX: ACS, pulmonary embolism, dissection, pneumothorax, effusion, infiltrate, arrhythmia, anemia, electrolyte derangement, MSK, GERD, anxiety. Evaluation initiated with labs, EKG, and CXR. Patient on cardiac monitor.   Work-up in the ER reviewed:  CBC: Mild nonspecific leukocytosis. No anemia.  BMP: No significant electrolyte derangement. Hyperglycemia- no anion gap elevation or acidosis.  I-stat Hcg by triage- mildly elevated- s/p hysterectomy w/ age- likely false.  EKG: No STEMI High sensitivity troponin: Initial 4 delta 5- both < 18, no significant change, HEAR score is 4- discussed options of observation admission vs. Discharge home with outpatient cardiology follow up with patient & her daughter @ bedside, she would prefer to go home and follow up outpatient which I feel is reasonable.  CXR: No infiltrate, effusion, pneumothorax, or fracture/dislocation.-- Nodular opacity projecting over the right lower lung likely reflecting a nipple shadow versus less likely a pulmonary nodule- will need repeat outpatient CXR.    Patient is low risk wells, PERC negative, doubt pulmonary embolism. Pain is not a tearing sensation, symmetric pulses, no widening of mediastinum on CXR, doubt dissection. Cardiac monitor reviewed, no notable arrhythmias. Patient has appeared hemodynamically stable throughout ER visit and appears safe for discharge with close PCP/cardiology follow up. Will provide  atarax for anxiety/stress & suspect this may help with sleep as well. I discussed results, treatment plan, need for PCP follow-up, and return precautions with the patient. Provided opportunity for questions, patient confirmed understanding and is in agreement with plan.     Final Clinical Impressions(s) / ED Diagnoses   Final diagnoses:  Chest pain, unspecified type    ED Discharge Orders  Ordered    hydrOXYzine (ATARAX/VISTARIL) 25 MG tablet  Every 8 hours PRN     08/15/18 1449           Kelin Nixon, Pleas KochSamantha R, PA-C 08/15/18 1457    Virgina NorfolkCuratolo, Adam, DO 08/15/18 1528

## 2018-08-21 ENCOUNTER — Telehealth: Payer: Self-pay | Admitting: Internal Medicine

## 2018-08-21 NOTE — Telephone Encounter (Signed)
LVM for patient to call and reschedule Dr. Margaretann Loveless appointment to either Dr. Gardiner Rhyme or Dr. Marisue Ivan (40 minutes).

## 2018-08-29 ENCOUNTER — Ambulatory Visit: Payer: BC Managed Care – PPO | Admitting: Internal Medicine

## 2018-09-10 NOTE — Progress Notes (Signed)
Cardiology Office Note:   Date:  09/11/2018  NAME:  Mary Campbell    MRN: [id number] DOB:  01/10/1966   PCP:  Jilda Panda, MD  Cardiologist:  Evalina Field, MD  Electrophysiologist:  None   Referring MD: Jilda Panda, MD   Chief Complaint  Patient presents with  . New Patient (Initial Visit)    Pst hospital follow up.  . Chest Pain   History of Present Illness:   Mary Campbell is a 53 y.o. female with a hx of hypertension, tobacco abuse, diabetes who is being seen today for the evaluation of chest pain at the request of Jilda Panda, MD.  She presents after a visit to the emergency room in late July for chest pain.  She reported prior to going into work she had 8 out of 10 chest pressure that then became sharp.  It was in her left side of her chest, described as 8 out of 10, associated with shortness of breath.  Evaluation in the emergency room demonstrated normal troponins and no acute ST-T changes to suggest ischemia.  She was instructed to follow with cardiology.  She reports no recurrence of her chest pain, but has exertional shortness of breath with activity.  She reports it is worsening over the past 1 month.  She also reports cramping in her legs with exercise.  She currently smokes 1 pack a day and has done so for 40+ years.  She is a diabetic on insulin.  She reports she has severely elevated cholesterol but is not on medication because she tried pravastatin and did not like this medication.  She reports a strong family history of myocardial infarction in her father in his 58s.  I do not have lab data from her primary care physician.  Past Medical History: Past Medical History:  Diagnosis Date  . Chronic back pain   . Diabetes mellitus   . Hyperlipidemia   . Hypertension     Past Surgical History: Past Surgical History:  Procedure Laterality Date  . ABDOMINAL HYSTERECTOMY    . CHOLECYSTECTOMY      Current Medications: Current Meds  Medication Sig  . acetaminophen  (TYLENOL) 325 MG tablet Take 650 mg by mouth every 6 (six) hours as needed for mild pain or headache.  . Azilsartan Medoxomil (EDARBI) 40 MG TABS Take 40 mg by mouth daily.  . Insulin Glargine, 1 Unit Dial, (TOUJEO SOLOSTAR) 300 UNIT/ML SOPN Inject 48 Units into the skin at bedtime.  . metFORMIN (GLUCOPHAGE) 1000 MG tablet Take 1,000 mg by mouth 2 (two) times daily.     Allergies:    Codeine, Lisinopril, and Sulfonamide derivatives   Social History: Social History   Socioeconomic History  . Marital status: Legally Separated    Spouse name: Not on file  . Number of children: Not on file  . Years of education: Not on file  . Highest education level: Not on file  Occupational History  . Not on file  Social Needs  . Financial resource strain: Not on file  . Food insecurity    Worry: Not on file    Inability: Not on file  . Transportation needs    Medical: Not on file    Non-medical: Not on file  Tobacco Use  . Smoking status: Current Every Day Smoker    Packs/day: 1.00    Types: Cigarettes  . Smokeless tobacco: Never Used  Substance and Sexual Activity  . Alcohol use: No  . Drug use: No  .  Sexual activity: Yes    Birth control/protection: Surgical  Lifestyle  . Physical activity    Days per week: Not on file    Minutes per session: Not on file  . Stress: Not on file  Relationships  . Social Musicianconnections    Talks on phone: Not on file    Gets together: Not on file    Attends religious service: Not on file    Active member of club or organization: Not on file    Attends meetings of clubs or organizations: Not on file    Relationship status: Not on file  Other Topics Concern  . Not on file  Social History Narrative  . Not on file     Family History: The patient's family history includes Diabetes in her mother; Heart attack in her father; Heart disease in her father; Hyperlipidemia in her father and mother; Hypertension in her father and mother.  ROS:   All other  ROS reviewed and negative. Pertinent positives noted in the HPI.     EKGs/Labs/Other Studies Reviewed:   The following studies were personally reviewed by me today:  EKG:  EKG is ordered today.  The ekg ordered today demonstrates normal sinus rhythm, heart rate 82, normal intervals, old anterior septal infarct, no acute ST-T changes, and was personally reviewed by me.   Recent Labs: 08/15/2018: BUN 13; Creatinine, Ser 1.02; Hemoglobin 13.2; Platelets 291; Potassium 3.8; Sodium 136   Recent Lipid Panel No results found for: CHOL, TRIG, HDL, CHOLHDL, VLDL, LDLCALC, LDLDIRECT  Physical Exam:   VS:  BP (!) 124/58 (BP Location: Left Arm, Patient Position: Sitting, Cuff Size: Normal)   Pulse 82   Temp 97.7 F (36.5 C)   Ht 5\' 5"  (1.651 m)   Wt 182 lb (82.6 kg)   BMI 30.29 kg/m    Wt Readings from Last 3 Encounters:  09/11/18 182 lb (82.6 kg)  03/14/18 178 lb (80.7 kg)  09/24/14 204 lb (92.5 kg)    General: Well nourished, well developed, in no acute distress Heart: Atraumatic, normal size  Eyes: PEERLA, EOMI, xanthelasma noted in periorbital area Neck: Supple, no JVD Endocrine: No thryomegaly Cardiac: Normal S1, S2; RRR; no murmurs, rubs, or gallops Lungs: Clear to auscultation bilaterally, no wheezing, rhonchi or rales  Abd: Soft, nontender, no hepatomegaly  Ext: No edema, pulses 2+ Musculoskeletal: No deformities, BUE and BLE strength normal and equal Skin: Warm and dry, no rashes   Neuro: Alert and oriented to person, place, time, and situation, CNII-XII grossly intact, no focal deficits  Psych: Normal mood and affect   ASSESSMENT:   NAME@ is a 53 y.o. female who presents for the following: 1. Chest pain of uncertain etiology   2. Claudication in peripheral vascular disease (HCC)   3. Mixed hyperlipidemia     PLAN:   1. Chest pain of uncertain etiology -No further recurrence of her chest pain, however she has profound risk factors for obstructive CAD. 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 Claudication in peripheral vascular disease (HCC) -Diminished pulses and symptoms of claudication reported -Primary care physician has ordered ABIs and lower extremity arterial duplex and will proceed  with this -I suspect she will end up having PAD and smoking cessation was advised  3. Mixed hyperlipidemia -I suspect she will have extremely elevated cholesterol she has xanthelasmas in her periorbital area -She will begin Crestor and we will recheck a fasting lipid profile  4. Tobacco Abuse -Smoking cessation advised   Disposition: Return in about 4 weeks (around 10/09/2018).  Medication Adjustments/Labs and Tests Ordered: Current medicines are reviewed at length with the patient today.  Concerns regarding medicines are outlined above.  Orders Placed This Encounter  Procedures  . EKG 12-Lead   No orders of the defined types were placed in this encounter.   There are no Patient Instructions on file for this visit.   Signed, Lenna GilfordWesley T. Flora Lipps'Neal, MD [facility]  [facility]  [address], [address] [place], [place] [address] [phone]  09/11/2018 10:24 AM

## 2018-09-11 ENCOUNTER — Encounter: Payer: Self-pay | Admitting: Cardiovascular Disease

## 2018-09-11 ENCOUNTER — Ambulatory Visit (INDEPENDENT_AMBULATORY_CARE_PROVIDER_SITE_OTHER): Payer: BC Managed Care – PPO | Admitting: Cardiovascular Disease

## 2018-09-11 ENCOUNTER — Other Ambulatory Visit: Payer: Self-pay

## 2018-09-11 VITALS — BP 124/58 | HR 82 | Temp 97.7°F | Ht 65.0 in | Wt 182.0 lb

## 2018-09-11 DIAGNOSIS — Z720 Tobacco use: Secondary | ICD-10-CM

## 2018-09-11 DIAGNOSIS — I739 Peripheral vascular disease, unspecified: Secondary | ICD-10-CM

## 2018-09-11 DIAGNOSIS — E782 Mixed hyperlipidemia: Secondary | ICD-10-CM

## 2018-09-11 DIAGNOSIS — R0789 Other chest pain: Secondary | ICD-10-CM | POA: Diagnosis not present

## 2018-09-11 DIAGNOSIS — R079 Chest pain, unspecified: Secondary | ICD-10-CM

## 2018-09-11 NOTE — Patient Instructions (Addendum)
Medication Instructions:  - START  ROSUVASTATIN 40 MG  ONE TABLET AT BEDTIME  CONTINUE ALL OTHER MEDICATION  Lab work: FASTING   LABS- NOTHING TO EAT OR DRINK THE MORNING BMP LIPID HGBA1C If you have labs (blood work) drawn today and your tests are completely normal, you will receive your results only by: Marland Kitchen MyChart Message (if you have MyChart) OR . A paper copy in the mail If you have any lab test that is abnormal or we need to change your treatment, we will call you to review the results.  Testing/Procedures: WILL BE SCHEDULE  AT [place] Your physician has requested that you have an echocardiogram. Echocardiography is a painless test that uses sound waves to create images of your heart. It provides your doctor with information about the size and shape of your heart and how well your heart's chambers and valves are working. This procedure takes approximately one hour. There are no restrictions for this procedure.  AND Your physician has requested that you have a lexiscan myoview. For further information please visit [email or web address]. Please follow instruction sheet, as given.    COMPLETE TEST - FOR ABI BY PRIMARY- [place] [address] Your physician has requested that you have an ankle brachial index (ABI). During this test an ultrasound and blood pressure cuff are used to evaluate the arteries that supply the arms and legs with blood. Allow thirty minutes for this exam. There are no restrictions or special instructions. AND Your physician has requested that you have a lower  extremity arterial duplex. This test is an ultrasound of the arteries in the legs . It looks at arterial blood flow in the legs . Allow one hour for Lower Arterial scans. There are no restrictions or special instructions   Follow-Up: At [facility], you and your health needs are our priority.  As part of our continuing mission to provide you with exceptional heart  care, we have created designated Provider Care Teams.  These Care Teams include your primary Cardiologist (physician) and Advanced Practice Providers (APPs -  Physician Assistants and Nurse Practitioners) who all work together to provide you with the care you need, when you need it. . Dr Audie Box recommends that you schedule a follow-up appointment in 4 WEEKS     Any Other Special Instructions Will Be Listed Below (If Applicable).

## 2018-09-14 LAB — BASIC METABOLIC PANEL
BUN/Creatinine Ratio: 13 (ref 9–23)
BUN: 13 mg/dL (ref 6–24)
CO2: 27 mmol/L (ref 20–29)
Calcium: 9.6 mg/dL (ref 8.7–10.2)
Chloride: 98 mmol/L (ref 96–106)
Creatinine, Ser: 1.02 mg/dL — ABNORMAL HIGH (ref 0.57–1.00)
GFR calc Af Amer: 72 mL/min/{1.73_m2} (ref >59)
GFR calc non Af Amer: 62 mL/min/{1.73_m2} (ref >59)
Glucose: 84 mg/dL (ref 65–99)
Potassium: 4.5 mmol/L (ref 3.5–5.2)
Sodium: 141 mmol/L (ref 134–144)

## 2018-09-14 LAB — LIPID PANEL
Chol/HDL Ratio: 8.2 ratio — ABNORMAL HIGH (ref 0.0–4.4)
Cholesterol, Total: 230 mg/dL — ABNORMAL HIGH (ref 100–199)
HDL: 28 mg/dL — ABNORMAL LOW (ref >39)
LDL Calculated: 126 mg/dL — ABNORMAL HIGH (ref 0–99)
Triglycerides: 382 mg/dL — ABNORMAL HIGH (ref 0–149)
VLDL Cholesterol Cal: 76 mg/dL — ABNORMAL HIGH (ref 5–40)

## 2018-09-14 LAB — HEMOGLOBIN A1C
Est. average glucose Bld gHb Est-mCnc: 177 mg/dL
Hgb A1c MFr Bld: 7.8 % — ABNORMAL HIGH (ref 4.8–5.6)

## 2018-09-17 ENCOUNTER — Telehealth (HOSPITAL_COMMUNITY): Payer: Self-pay | Admitting: *Deleted

## 2018-09-17 NOTE — Telephone Encounter (Signed)
Left message on voicemail in reference to upcoming appointment scheduled for 09/19/18. Phone number given for a call back so details instructions can be given. Mary Campbell Jacqueline  

## 2018-09-18 ENCOUNTER — Telehealth (HOSPITAL_COMMUNITY): Payer: Self-pay | Admitting: *Deleted

## 2018-09-18 NOTE — Telephone Encounter (Signed)
Patient given detailed instructions per Myocardial Perfusion Study Information Sheet for the test on 09/19/2018 at 1000. Patient notified to arrive 15 minutes early and that it is imperative to arrive on time for appointment to keep from having the test rescheduled.  If you need to cancel or reschedule your appointment, please call the office within 24 hours of your appointment. . Patient verbalized understanding.Satori Krabill, Ranae Palms

## 2018-09-19 ENCOUNTER — Ambulatory Visit (HOSPITAL_BASED_OUTPATIENT_CLINIC_OR_DEPARTMENT_OTHER): Payer: BC Managed Care – PPO

## 2018-09-19 ENCOUNTER — Other Ambulatory Visit: Payer: Self-pay

## 2018-09-19 ENCOUNTER — Ambulatory Visit (HOSPITAL_COMMUNITY): Payer: BC Managed Care – PPO | Attending: Cardiovascular Disease

## 2018-09-19 DIAGNOSIS — R0789 Other chest pain: Secondary | ICD-10-CM

## 2018-09-19 DIAGNOSIS — R079 Chest pain, unspecified: Secondary | ICD-10-CM

## 2018-09-19 LAB — MYOCARDIAL PERFUSION IMAGING
LV dias vol: 69 mL (ref 46–106)
LV sys vol: 20 mL
Peak HR: 109 {beats}/min
Rest HR: 72 {beats}/min
SDS: 2
SRS: 0
SSS: 2
TID: 0.94

## 2018-09-19 MED ORDER — TECHNETIUM TC 99M TETROFOSMIN IV KIT
11.0000 | PACK | Freq: Once | INTRAVENOUS | Status: AC | PRN
  Administered 2018-09-19: 11 via INTRAVENOUS
  Filled 2018-09-19: qty 11

## 2018-09-19 MED ORDER — TECHNETIUM TC 99M TETROFOSMIN IV KIT
32.4000 | PACK | Freq: Once | INTRAVENOUS | Status: AC | PRN
  Administered 2018-09-19: 32.4 via INTRAVENOUS
  Filled 2018-09-19: qty 33

## 2018-09-19 MED ORDER — REGADENOSON 0.4 MG/5ML IV SOLN
0.4000 mg | Freq: Once | INTRAVENOUS | Status: AC
  Administered 2018-09-19: 0.4 mg via INTRAVENOUS

## 2018-09-26 ENCOUNTER — Telehealth: Payer: Self-pay | Admitting: *Deleted

## 2018-09-26 ENCOUNTER — Inpatient Hospital Stay (HOSPITAL_COMMUNITY): Admission: RE | Admit: 2018-09-26 | Payer: BC Managed Care – PPO | Source: Ambulatory Visit

## 2018-09-26 ENCOUNTER — Ambulatory Visit (HOSPITAL_COMMUNITY)
Admission: RE | Admit: 2018-09-26 | Discharge: 2018-09-26 | Disposition: A | Discharge status: Home / Self Care | Payer: BC Managed Care – PPO | Source: Ambulatory Visit | Attending: Cardiology | Admitting: Cardiology

## 2018-09-26 ENCOUNTER — Other Ambulatory Visit: Payer: Self-pay

## 2018-09-26 DIAGNOSIS — I739 Peripheral vascular disease, unspecified: Secondary | ICD-10-CM

## 2018-09-26 DIAGNOSIS — R931 Abnormal findings on diagnostic imaging of heart and coronary circulation: Secondary | ICD-10-CM

## 2018-09-26 DIAGNOSIS — I517 Cardiomegaly: Secondary | ICD-10-CM

## 2018-09-26 MED ORDER — ROSUVASTATIN CALCIUM 40 MG PO TABS: 40.0000 mg | ORAL_TABLET | Freq: Every day | ORAL | 1 refills | Status: DC

## 2018-09-26 NOTE — Telephone Encounter (Signed)
-----   Message from Skeet Latch, MD sent at 09/24/2018  4:43 PM EDT ----- Echo shows that her left ventricle squeezes well.  RV is thickened.  Recommend getting a cardiac MRI to better assess.

## 2018-09-26 NOTE — Telephone Encounter (Signed)
Advised patient and she will try to proceed with MRI

## 2018-09-26 NOTE — Telephone Encounter (Signed)
Happy to give her ativan before if she wants it.  Otherwise she can so no, but I recommend doing the MRI if possible.

## 2018-09-26 NOTE — Telephone Encounter (Signed)
Advised of Echo. Patient stated she does not think she can do MRI she is very claustrophobic, even with the nuclear and when she had open MRI. She is still hesitant on trying with something  To relax her prior. Will forward to Dr Oval Linsey for review

## 2018-09-27 ENCOUNTER — Telehealth: Payer: Self-pay | Admitting: Cardiovascular Disease

## 2018-09-27 NOTE — Telephone Encounter (Signed)
Attempted to call Mary Campbell about recent testing. Normal echo and normal stress test. She has significant PAD and will need referral to see Dr. Gwenlyn Found or Dr. Fletcher Anon at [facility] clinic. Left message for her to call us back.   Evalina Field, MD

## 2018-10-02 NOTE — Telephone Encounter (Signed)
New Message    Patient calling in states she god too different results for her test that was performed and would like to speak to a nurse about it.  Please return patient's call.

## 2018-10-03 NOTE — Telephone Encounter (Signed)
Spoke with patient and will proceed with cardiac MRI per Dr Heather Roberts message to [place]. Patient scheduled for [facility] consult tomorrow with Dr Gwenlyn Found. Patient aware of date and time Message sent to schedulers to arrange MRI, sent to precert dept.   Will forward to Dr Oval Linsey for dose of Ativan

## 2018-10-04 ENCOUNTER — Encounter: Payer: Self-pay | Admitting: Cardiovascular Disease

## 2018-10-04 ENCOUNTER — Other Ambulatory Visit: Payer: Self-pay | Admitting: *Deleted

## 2018-10-04 ENCOUNTER — Ambulatory Visit (INDEPENDENT_AMBULATORY_CARE_PROVIDER_SITE_OTHER): Payer: BC Managed Care – PPO | Admitting: Cardiovascular Disease

## 2018-10-04 ENCOUNTER — Telehealth: Payer: Self-pay | Admitting: Cardiovascular Disease

## 2018-10-04 ENCOUNTER — Other Ambulatory Visit: Payer: Self-pay

## 2018-10-04 VITALS — BP 142/66 | HR 90 | Temp 97.5°F | Ht 65.0 in | Wt 185.0 lb

## 2018-10-04 DIAGNOSIS — Z79899 Other long term (current) drug therapy: Secondary | ICD-10-CM

## 2018-10-04 DIAGNOSIS — Z720 Tobacco use: Secondary | ICD-10-CM | POA: Insufficient documentation

## 2018-10-04 DIAGNOSIS — I10 Essential (primary) hypertension: Secondary | ICD-10-CM | POA: Diagnosis not present

## 2018-10-04 DIAGNOSIS — I739 Peripheral vascular disease, unspecified: Secondary | ICD-10-CM

## 2018-10-04 DIAGNOSIS — Z8249 Family history of ischemic heart disease and other diseases of the circulatory system: Secondary | ICD-10-CM | POA: Insufficient documentation

## 2018-10-04 DIAGNOSIS — E785 Hyperlipidemia, unspecified: Secondary | ICD-10-CM | POA: Insufficient documentation

## 2018-10-04 NOTE — Telephone Encounter (Signed)
1mg  of ativan 30 minutes prior to her appointment.

## 2018-10-04 NOTE — H&P (View-Only) (Signed)
10/04/2018 Mary Campbell   07/06/1965  [id number]  Primary Physician Ralene OkMoreira, Roy, MD Primary Cardiologist: Runell GessJonathan J Janene Yousuf MD Nicholes CalamityFACP, FACC, FAHA, [place]  HPI:  Mary Campbell is a 53 y.o. 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  She was referred by Dr. Bufford Buttner'Neill, her primary cardiologist, for peripheral vascular valuation because of lifestyle limiting claudication.  Her cardiac risk factors are notable for 40-50-pack-year tobacco abuse currently trying to stop on Chantix, treated hypertension, diabetes and hyperlipidemia as well as family history with a father who had multiple stents myocardial infarction's.  She is never had a heart attack or stroke.  She currently denies chest pain or shortness of breath although recently she did have chest pain and was evaluated by Dr. Carmon Ginsberg. Neil.  She had a negative Myoview and a normal 2D echo.  She is complained of claudication over the last year which is fairly symmetric.  This is not bothering her during her workday but when walking longer distances, out shopping, she does have to stop because of cramping in her calves.  Doppler studies performed 09/26/2018 revealed ABIs of about 0.7 bilaterally with an occluded above-the-knee popliteal on the right and high-frequency signal in the proximal left SFA.   Current Meds  Medication Sig  . acetaminophen (TYLENOL) 325 MG tablet Take 650 mg by mouth every 6 (six) hours as needed for mild pain or headache.  . Azilsartan Medoxomil (EDARBI) 40 MG TABS Take 40 mg by mouth daily.  . Insulin Glargine, 1 Unit Dial, (TOUJEO SOLOSTAR) 300 UNIT/ML SOPN Inject 48 Units into the skin at bedtime.  . metFORMIN (GLUCOPHAGE) 1000 MG tablet Take 1,000 mg by mouth 2 (two) times daily.  . rosuvastatin (CRESTOR) 40 MG tablet Take 1 tablet (40 mg total) by mouth daily.     Allergies  Allergen Reactions  . Codeine Hives  . Lisinopril Cough  . Sulfonamide Derivatives Hives    Social  History   Socioeconomic History  . Marital status: Legally Separated    Spouse name: Not on file  . Number of children: Not on file  . Years of education: Not on file  . Highest education level: Not on file  Occupational History  . Not on file  Social Needs  . Financial resource strain: Not on file  . Food insecurity    Worry: Not on file    Inability: Not on file  . Transportation needs    Medical: Not on file    Non-medical: Not on file  Tobacco Use  . Smoking status: Current Every Day Smoker    Packs/day: 1.00    Types: Cigarettes  . Smokeless tobacco: Never Used  Substance and Sexual Activity  . Alcohol use: No  . Drug use: No  . Sexual activity: Yes    Birth control/protection: Surgical  Lifestyle  . Physical activity    Days per week: Not on file    Minutes per session: Not on file  . Stress: Not on file  Relationships  . Social Musicianconnections    Talks on phone: Not on file    Gets together: Not on file    Attends religious service: Not on file    Active member of club or organization: Not on file    Attends meetings of clubs or organizations: Not on file    Relationship status: Not on file  . Intimate partner violence    Fear of current or ex partner: Not on file  Emotionally abused: Not on file    Physically abused: Not on file    Forced sexual activity: Not on file  Other Topics Concern  . Not on file  Social History Narrative  . Not on file     Review of Systems: General: negative for chills, fever, night sweats or weight changes.  Cardiovascular: negative for chest pain, dyspnea on exertion, edema, orthopnea, palpitations, paroxysmal nocturnal dyspnea or shortness of breath Dermatological: negative for rash Respiratory: negative for cough or wheezing Urologic: negative for hematuria Abdominal: negative for nausea, vomiting, diarrhea, bright red blood per rectum, melena, or hematemesis Neurologic: negative for visual changes, syncope, or dizziness  All other systems reviewed and are otherwise negative except as noted above.    Blood pressure (!) 142/66, pulse 90, temperature (!) 97.5 F (36.4 C), height 5\' 5"  (1.651 m), weight 185 lb (83.9 kg).  General appearance: alert and no distress Neck: no adenopathy, no carotid bruit, no JVD, supple, symmetrical, trachea midline and thyroid not enlarged, symmetric, no tenderness/mass/nodules Lungs: clear to auscultation bilaterally Heart: regular rate and rhythm, S1, S2 normal, no murmur, click, rub or gallop Extremities: extremities normal, atraumatic, no cyanosis or edema Pulses: 2+ and symmetric Skin: Skin color, texture, turgor normal. No rashes or lesions Neurologic: Alert and oriented X 3, normal strength and tone. Normal symmetric reflexes. Normal coordination and gait  EKG not performed today  ASSESSMENT AND PLAN:   Claudication in peripheral vascular disease ([place]) [name] was referred to me by Dr. Farris Has for evaluation treatment of symptomatic PAD.  She has multiple risk factors including a long history tobacco abuse currently trying to quit, treated hypertension, diabetes and hyperlipidemia as well as a family history for heart disease.  She is a claudication for a year which is pretty symmetric and bothers her mostly when she is out shopping or walking long distances.  Recent Doppler studies performed 09/26/2018 revealed ABIs of 0.69 on the right and 0.68 on the left with a short segment occlusion right proximal popliteal artery and high-frequency signal in the proximal left SFA.  She wishes to have this percutaneously addressed per lifestyle improvement.      Lorretta Harp MD FACP,FACC,FAHA, [facility] 10/04/2018 1:48 PM

## 2018-10-04 NOTE — Telephone Encounter (Signed)
LVM for patient to call and let me know what time of day she would like her cardiac MRI scheduled.

## 2018-10-04 NOTE — Progress Notes (Signed)
   10/04/2018 Mary Campbell   10/06/1965  [id number]  Primary Physician Campbell, Roy, MD Primary Cardiologist: Mary J Berry MD FACP, FACC, FAHA, FSCAI  HPI:  Mary Campbell is a 53 y.o. 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  She was referred by Mary Campbell, her primary cardiologist, for peripheral vascular valuation because of lifestyle limiting claudication.  Her cardiac risk factors are notable for 40-50-pack-year tobacco abuse currently trying to stop on Chantix, treated hypertension, diabetes and hyperlipidemia as well as family history with a father who had multiple stents myocardial infarction's.  She is never had a heart attack or stroke.  She currently denies chest pain or shortness of breath although recently she did have chest pain and was evaluated by Dr. O. Campbell.  She had a negative Myoview and a normal 2D echo.  She is complained of claudication over the last year which is fairly symmetric.  This is not bothering her during her workday but when walking longer distances, out shopping, she does have to stop because of cramping in her calves.  Doppler studies performed 09/26/2018 revealed ABIs of about 0.7 bilaterally with an occluded above-the-knee popliteal on the right and high-frequency signal in the proximal left SFA.   Current Meds  Medication Sig  . acetaminophen (TYLENOL) 325 MG tablet Take 650 mg by mouth every 6 (six) hours as needed for mild pain or headache.  . Azilsartan Medoxomil (EDARBI) 40 MG TABS Take 40 mg by mouth daily.  . Insulin Glargine, 1 Unit Dial, (TOUJEO SOLOSTAR) 300 UNIT/ML SOPN Inject 48 Units into the skin at bedtime.  . metFORMIN (GLUCOPHAGE) 1000 MG tablet Take 1,000 mg by mouth 2 (two) times daily.  . rosuvastatin (CRESTOR) 40 MG tablet Take 1 tablet (40 mg total) by mouth daily.     Allergies  Allergen Reactions  . Codeine Hives  . Lisinopril Cough  . Sulfonamide Derivatives Hives    Social  History   Socioeconomic History  . Marital status: Legally Separated    Spouse name: Not on file  . Number of children: Not on file  . Years of education: Not on file  . Highest education level: Not on file  Occupational History  . Not on file  Social Needs  . Financial resource strain: Not on file  . Food insecurity    Worry: Not on file    Inability: Not on file  . Transportation needs    Medical: Not on file    Non-medical: Not on file  Tobacco Use  . Smoking status: Current Every Day Smoker    Packs/day: 1.00    Types: Cigarettes  . Smokeless tobacco: Never Used  Substance and Sexual Activity  . Alcohol use: No  . Drug use: No  . Sexual activity: Yes    Birth control/protection: Surgical  Lifestyle  . Physical activity    Days per week: Not on file    Minutes per session: Not on file  . Stress: Not on file  Relationships  . Social connections    Talks on phone: Not on file    Gets together: Not on file    Attends religious service: Not on file    Active member of club or organization: Not on file    Attends meetings of clubs or organizations: Not on file    Relationship status: Not on file  . Intimate partner violence    Fear of current or ex partner: Not on file      Emotionally abused: Not on file    Physically abused: Not on file    Forced sexual activity: Not on file  Other Topics Concern  . Not on file  Social History Narrative  . Not on file     Review of Systems: General: negative for chills, fever, night sweats or weight changes.  Cardiovascular: negative for chest pain, dyspnea on exertion, edema, orthopnea, palpitations, paroxysmal nocturnal dyspnea or shortness of breath Dermatological: negative for rash Respiratory: negative for cough or wheezing Urologic: negative for hematuria Abdominal: negative for nausea, vomiting, diarrhea, bright red blood per rectum, melena, or hematemesis Neurologic: negative for visual changes, syncope, or dizziness  All other systems reviewed and are otherwise negative except as noted above.    Blood pressure (!) 142/66, pulse 90, temperature (!) 97.5 F (36.4 C), height 5\' 5"  (1.651 m), weight 185 lb (83.9 kg).  General appearance: alert and no distress Neck: no adenopathy, no carotid bruit, no JVD, supple, symmetrical, trachea midline and thyroid not enlarged, symmetric, no tenderness/mass/nodules Lungs: clear to auscultation bilaterally Heart: regular rate and rhythm, S1, S2 normal, no murmur, click, rub or gallop Extremities: extremities normal, atraumatic, no cyanosis or edema Pulses: 2+ and symmetric Skin: Skin color, texture, turgor normal. No rashes or lesions Neurologic: Alert and oriented X 3, normal strength and tone. Normal symmetric reflexes. Normal coordination and gait  EKG not performed today  ASSESSMENT AND PLAN:   Claudication in peripheral vascular disease ([place]) Mary Campbell was referred to me by Dr. Farris Campbell for evaluation treatment of symptomatic PAD.  She Campbell multiple risk factors including a long history tobacco abuse currently trying to quit, treated hypertension, diabetes and hyperlipidemia as well as a family history for heart disease.  She is a claudication for a year which is pretty symmetric and bothers her mostly when she is out shopping or walking long distances.  Recent Doppler studies performed 09/26/2018 revealed ABIs of 0.69 on the right and 0.68 on the left with a short segment occlusion right proximal popliteal artery and high-frequency signal in the proximal left SFA.  She wishes to have this percutaneously addressed per lifestyle improvement.      Mary Harp MD FACP,FACC,FAHA, [facility] 10/04/2018 1:48 PM

## 2018-10-04 NOTE — Patient Instructions (Addendum)
Medication Instructions:  Continue current medications  If you need a refill on your cardiac medications before your next appointment, please call your pharmacy.  Labwork: BMP, TSH and CBC Monday September 28th HERE IN OUR OFFICE AT [facility]   You will need to fast. DO NOT EAT OR DRINK PAST MIDNIGHT.     You will NOT need to fast   Take the provided lab slips with you to the lab for your blood draw.   When you have your labs (blood work) drawn today and your tests are completely normal, you will receive your results only by MyChart Message (if you have MyChart) -OR-  A paper copy in the mail.  If you have any lab test that is abnormal or we need to change your treatment, we will call you to review these results.  Testing/Procedures: Your physician has requested that you have a peripheral vascular angiogram. This exam is performed at the hospital. During this exam IV contrast is used to look at arterial blood flow. Please review the information sheet given for details.  Your physician has requested that you have a lower extremity arterial duplex. This test is an ultrasound of the arteries in the legs or arms. It looks at arterial blood flow in the legs and arms. Allow one hour for Lower and Upper Arterial scans. There are no restrictions or special instructions  Your physician has requested that you have an ankle brachial index (ABI). During this test an ultrasound and blood pressure cuff are used to evaluate the arteries that supply the arms and legs with blood. Allow thirty minutes for this exam. There are no restrictions or special instructions.    Special Instructions:    [facility] [address] [place] [address] Dept: [phone] Loc: [phone]  Kiyonna Tortorelli  10/04/2018  You are scheduled for a Peripheral Angiogram on Thursday, October 1 with Dr. Quay Burow.  1. Please arrive at  the [facility] (Main Entrance A) at [facility]: [address] [place], [place] [address] at 7:30 AM (This time is two hours before your procedure to ensure your preparation). Free valet parking service is available.   Special note: Every effort is made to have your procedure done on time. Please understand that emergencies sometimes delay scheduled procedures.  2. Diet: Do not eat solid foods after midnight.  The patient may have clear liquids until 5am upon the day of the procedure.  3. Labs: You will need to have blood drawn on Thursday, September 28 at [place], [place]  Open: 8am - 5pm (Lunch 12:30 - 1:30)   Phone: [phone]. You do not need to be fasting.  COVID19 TEST:  This is a Drive Up Visit at the [employer] [address] Someone will direct you to the appropriate testing line. Stay in your car and someone will be with you shortly. 4. Medication instructions in preparation for your procedure:   Contrast Allergy: No  Do not take Diabetes Med Glucophage (Metformin) on the day of the procedure and HOLD 48 HOURS AFTER THE PROCEDURE.  On the morning of your procedure, take  any morning medicines NOT listed above.  You may use sips of water.  5. Plan for one night stay--bring personal belongings. 6. Bring a current list of your medications and current insurance cards. 7. You MUST have a responsible person to drive you home. 8. Someone MUST  be with you the first 24 hours after you arrive home or your discharge will be delayed. 9. Please wear clothes that are easy to get on and off and wear slip-on shoes.  Thank you for allowing us to care for you!   -- [facility]   Follow-Up: . Your physician recommends that you schedule a follow-up appointment in: 1 Month   At [facility], you and your health needs are our priority.  As part of our continuing mission to provide you with  exceptional heart care, we have created designated Provider Care Teams.  These Care Teams include your primary Cardiologist (physician) and Advanced Practice Providers (APPs -  Physician Assistants and Nurse Practitioners) who all work together to provide you with the care you need, when you need it.  Thank you for choosing [facility] at [facility]!!

## 2018-10-04 NOTE — Assessment & Plan Note (Signed)
Mary Campbell was referred to me by Dr. Farris Has for evaluation treatment of symptomatic PAD.  She has multiple risk factors including a long history tobacco abuse currently trying to quit, treated hypertension, diabetes and hyperlipidemia as well as a family history for heart disease.  She is a claudication for a year which is pretty symmetric and bothers her mostly when she is out shopping or walking long distances.  Recent Doppler studies performed 09/26/2018 revealed ABIs of 0.69 on the right and 0.68 on the left with a short segment occlusion right proximal popliteal artery and high-frequency signal in the proximal left SFA.  She wishes to have this percutaneously addressed per lifestyle improvement.

## 2018-10-04 NOTE — Telephone Encounter (Signed)
Rx called to pharmacy, left patient message

## 2018-10-04 NOTE — Telephone Encounter (Signed)
See other telephone note.  

## 2018-10-05 MED ORDER — LORAZEPAM 1 MG PO TABS: ORAL_TABLET | ORAL | 0 refills | Status: DC

## 2018-10-05 NOTE — Telephone Encounter (Signed)
° °  Please call to schedule MRI after 10/1

## 2018-10-08 ENCOUNTER — Telehealth: Payer: Self-pay | Admitting: Cardiovascular Disease

## 2018-10-08 NOTE — Telephone Encounter (Signed)
Spoke with pt who states she would like to speak with Dr. Audie Box to review the results of her recent echo and stress test. She said she has been told different things about the results by different providers and would like to speak with Dr. Audie Box directly about her results. Per pt, she was contacted by a nurse and given results but she also spoke with Dr. Gwenlyn Found about results and was told they were okay. Informed her that nurse can route message to Dr. Audie Box and his primary nurse and either Dr. Audie Box or the nurse may return call. She states she prefers that Dr. Audie Box call her.

## 2018-10-08 NOTE — Telephone Encounter (Signed)
°  Patient requesting Dr Marisue Ivan call her. States she is having surgery on 10/01. Has questions about being seen prior to surg date. Declined sooner appt with APP

## 2018-10-08 NOTE — Telephone Encounter (Signed)
Called and discussed recent testing with [name]. She wishes to hold on the MRI for now. She will proceed with her peripheral vascular procedure on 10/1. We will move her appointment with me back 2-3 weeks. We will discuss need for cardiac MRI at that visit.   Evalina Field, MD

## 2018-10-09 NOTE — Telephone Encounter (Signed)
APPOINTMENT RESCHEDULE WITH DR O'NEAL .PATIENT IS AWARE  9/222/20.

## 2018-10-11 ENCOUNTER — Telehealth: Payer: Self-pay | Admitting: Cardiovascular Disease

## 2018-10-11 NOTE — Telephone Encounter (Signed)
The patient will need her PCP, Dr. Mellody Drown , to write the letter to her workplace

## 2018-10-11 NOTE — Telephone Encounter (Signed)
New Message  Patient is calling in due to having to leave work due to severe cramps in her legs. Patient want to know what she can do to help with the leg cramps. Patient also states that she needs a work note to cover her from today 10/11/18 through her surgery on 10/18/18. She states that her job told her to just stay out until after her surgery due to the issue she is having with her legs cramping and having a hard time walking because of that. Please give patient a call back to assist.

## 2018-10-11 NOTE — Telephone Encounter (Signed)
Left detailed message to call PCP for letter for being out of work until scheduled surgery.

## 2018-10-15 ENCOUNTER — Other Ambulatory Visit (HOSPITAL_COMMUNITY)
Admission: RE | Admit: 2018-10-15 | Discharge: 2018-10-15 | Disposition: A | Discharge status: Home / Self Care | Payer: BC Managed Care – PPO | Source: Ambulatory Visit | Attending: Cardiovascular Disease | Admitting: Cardiovascular Disease

## 2018-10-15 ENCOUNTER — Encounter: Payer: Self-pay | Admitting: Cardiovascular Disease

## 2018-10-15 DIAGNOSIS — Z20828 Contact with and (suspected) exposure to other viral communicable diseases: Secondary | ICD-10-CM | POA: Insufficient documentation

## 2018-10-15 DIAGNOSIS — Z01812 Encounter for preprocedural laboratory examination: Secondary | ICD-10-CM | POA: Insufficient documentation

## 2018-10-16 ENCOUNTER — Other Ambulatory Visit: Payer: Self-pay

## 2018-10-16 DIAGNOSIS — I739 Peripheral vascular disease, unspecified: Secondary | ICD-10-CM

## 2018-10-16 LAB — BASIC METABOLIC PANEL
BUN/Creatinine Ratio: 8 — ABNORMAL LOW (ref 9–23)
BUN: 8 mg/dL (ref 6–24)
CO2: 26 mmol/L (ref 20–29)
Calcium: 9.2 mg/dL (ref 8.7–10.2)
Chloride: 100 mmol/L (ref 96–106)
Creatinine, Ser: 1.05 mg/dL — ABNORMAL HIGH (ref 0.57–1.00)
GFR calc Af Amer: 69 mL/min/{1.73_m2} (ref >59)
GFR calc non Af Amer: 60 mL/min/{1.73_m2} (ref >59)
Glucose: 103 mg/dL — ABNORMAL HIGH (ref 65–99)
Potassium: 4.2 mmol/L (ref 3.5–5.2)
Sodium: 141 mmol/L (ref 134–144)

## 2018-10-16 LAB — TSH: TSH: 2.45 u[IU]/mL (ref 0.450–4.500)

## 2018-10-16 LAB — CBC
Hematocrit: 36.8 % (ref 34.0–46.6)
Hemoglobin: 12.5 g/dL (ref 11.1–15.9)
MCH: 29.6 pg (ref 26.6–33.0)
MCHC: 34 g/dL (ref 31.5–35.7)
MCV: 87 fL (ref 79–97)
Platelets: 305 10*3/uL (ref 150–450)
RBC: 4.22 x10E6/uL (ref 3.77–5.28)
RDW: 14.4 % (ref 11.7–15.4)
WBC: 13.1 10*3/uL — ABNORMAL HIGH (ref 3.4–10.8)

## 2018-10-16 LAB — NOVEL CORONAVIRUS, NAA (HOSP ORDER, SEND-OUT TO REF LAB; TAT 18-24 HRS): SARS-CoV-2, NAA: NOT DETECTED

## 2018-10-17 ENCOUNTER — Telehealth: Payer: Self-pay | Admitting: *Deleted

## 2018-10-17 NOTE — Telephone Encounter (Signed)
Pt contacted pre-abdominal aortogram  at [facility] for: Thursday October 18, 2018 9:30 AM Verified arrival time and place: [facility]) at: 7:30 AM   No solid food after midnight prior to cath, clear liquids until 5 AM day of procedure. Contrast allergy: no  Hold: Metformin-day of procedure and 48 hours post procedure. Azilsartan-Chlorthalidone -AM of procedure. 1/2 HS Insulin  Except hold medications AM meds can be  taken pre-cath with sip of water including: ASA 81 mg   Confirmed patient has responsible person to drive home post procedure and observe 24 hours after arriving home: yes  Currently, due to Covid-19 pandemic, only one support person will be allowed with patient. Must be the same support person for that patient's entire stay, will be screened and required to wear a mask. They will be asked to wait in the waiting room for the duration of the patient's stay.  Patients are required to wear a mask when they enter the hospital.     COVID-19 Pre-Screening Questions:  . In the past 7 to 10 days have you had a cough,  shortness of breath, headache, congestion, fever (100 or greater) body aches, chills, sore throat, or sudden loss of taste or sense of smell? no . Have you been around anyone with known Covid 19? no . Have you been around anyone who is awaiting Covid 19 test results in the past 7 to 10 days? no . Have you been around anyone who has been exposed to Covid 19, or has mentioned symptoms of Covid 19 within the past 7 to 10 days? no  I reviewed procedure/mask/visitor, Covid-19 screening questions with patient, she verbalized understanding, thanked me for call.

## 2018-10-18 ENCOUNTER — Other Ambulatory Visit: Payer: Self-pay

## 2018-10-18 ENCOUNTER — Encounter (HOSPITAL_COMMUNITY)
Admission: RE | Disposition: A | Discharge status: Home / Self Care | Payer: BC Managed Care – PPO | Source: Home / Self Care | Attending: Cardiovascular Disease

## 2018-10-18 ENCOUNTER — Ambulatory Visit (HOSPITAL_COMMUNITY)
Admission: RE | Admit: 2018-10-18 | Discharge: 2018-10-19 | Disposition: A | Discharge status: Home / Self Care | Payer: BC Managed Care – PPO | Attending: Cardiovascular Disease | Admitting: Cardiovascular Disease

## 2018-10-18 DIAGNOSIS — Z720 Tobacco use: Secondary | ICD-10-CM | POA: Diagnosis present

## 2018-10-18 DIAGNOSIS — I739 Peripheral vascular disease, unspecified: Secondary | ICD-10-CM

## 2018-10-18 DIAGNOSIS — I10 Essential (primary) hypertension: Secondary | ICD-10-CM | POA: Diagnosis not present

## 2018-10-18 DIAGNOSIS — Z8249 Family history of ischemic heart disease and other diseases of the circulatory system: Secondary | ICD-10-CM | POA: Insufficient documentation

## 2018-10-18 DIAGNOSIS — Z6830 Body mass index (BMI) 30.0-30.9, adult: Secondary | ICD-10-CM | POA: Diagnosis not present

## 2018-10-18 DIAGNOSIS — Z888 Allergy status to other drugs, medicaments and biological substances status: Secondary | ICD-10-CM | POA: Diagnosis not present

## 2018-10-18 DIAGNOSIS — Z7982 Long term (current) use of aspirin: Secondary | ICD-10-CM | POA: Insufficient documentation

## 2018-10-18 DIAGNOSIS — Z79899 Other long term (current) drug therapy: Secondary | ICD-10-CM | POA: Insufficient documentation

## 2018-10-18 DIAGNOSIS — E669 Obesity, unspecified: Secondary | ICD-10-CM | POA: Diagnosis not present

## 2018-10-18 DIAGNOSIS — Z7902 Long term (current) use of antithrombotics/antiplatelets: Secondary | ICD-10-CM | POA: Insufficient documentation

## 2018-10-18 DIAGNOSIS — I70212 Atherosclerosis of native arteries of extremities with intermittent claudication, left leg: Secondary | ICD-10-CM | POA: Diagnosis not present

## 2018-10-18 DIAGNOSIS — Z885 Allergy status to narcotic agent status: Secondary | ICD-10-CM | POA: Diagnosis not present

## 2018-10-18 DIAGNOSIS — Z882 Allergy status to sulfonamides status: Secondary | ICD-10-CM | POA: Diagnosis not present

## 2018-10-18 DIAGNOSIS — E785 Hyperlipidemia, unspecified: Secondary | ICD-10-CM | POA: Insufficient documentation

## 2018-10-18 DIAGNOSIS — Z794 Long term (current) use of insulin: Secondary | ICD-10-CM | POA: Diagnosis not present

## 2018-10-18 DIAGNOSIS — F17210 Nicotine dependence, cigarettes, uncomplicated: Secondary | ICD-10-CM | POA: Diagnosis not present

## 2018-10-18 DIAGNOSIS — E1151 Type 2 diabetes mellitus with diabetic peripheral angiopathy without gangrene: Secondary | ICD-10-CM | POA: Diagnosis not present

## 2018-10-18 HISTORY — PX: ABDOMINAL AORTOGRAM W/LOWER EXTREMITY: CATH118223

## 2018-10-18 HISTORY — DX: Peripheral vascular disease, unspecified: I73.9

## 2018-10-18 HISTORY — DX: Tobacco use: Z72.0

## 2018-10-18 HISTORY — PX: PERIPHERAL VASCULAR ATHERECTOMY: CATH118256

## 2018-10-18 LAB — GLUCOSE, CAPILLARY
Glucose-Capillary: 175 mg/dL — ABNORMAL HIGH (ref 70–99)
Glucose-Capillary: 157 mg/dL — ABNORMAL HIGH (ref 70–99)

## 2018-10-18 LAB — POCT ACTIVATED CLOTTING TIME
Activated Clotting Time: 252 seconds
Activated Clotting Time: 257 seconds
Activated Clotting Time: 202 seconds
Activated Clotting Time: 164 seconds

## 2018-10-18 SURGERY — ABDOMINAL AORTOGRAM W/LOWER EXTREMITY
Anesthesia: LOCAL | Laterality: Right

## 2018-10-18 MED ORDER — HEPARIN SODIUM (PORCINE) 1000 UNIT/ML IJ SOLN
INTRAMUSCULAR | Status: AC
  Filled 2018-10-18: qty 1

## 2018-10-18 MED ORDER — FENTANYL CITRATE (PF) 100 MCG/2ML IJ SOLN
INTRAMUSCULAR | Status: AC
  Filled 2018-10-18: qty 2

## 2018-10-18 MED ORDER — CLOPIDOGREL BISULFATE 75 MG PO TABS
75.0000 mg | ORAL_TABLET | Freq: Every day | ORAL | Status: DC
  Administered 2018-10-19: 75 mg via ORAL
  Filled 2018-10-18: qty 1

## 2018-10-18 MED ORDER — SODIUM CHLORIDE 0.9% FLUSH: 3.0000 mL | INTRAVENOUS | Status: DC | PRN

## 2018-10-18 MED ORDER — HEPARIN (PORCINE) IN NACL 1000-0.9 UT/500ML-% IV SOLN
INTRAVENOUS | Status: AC
  Filled 2018-10-18: qty 500

## 2018-10-18 MED ORDER — ACETAMINOPHEN 500 MG PO TABS: 1000.0000 mg | ORAL_TABLET | Freq: Four times a day (QID) | ORAL | Status: DC | PRN

## 2018-10-18 MED ORDER — DIPHENHYDRAMINE HCL 25 MG PO TABS: 25.0000 mg | ORAL_TABLET | Freq: Every day | ORAL | Status: DC | PRN

## 2018-10-18 MED ORDER — SODIUM CHLORIDE 0.9 % IV SOLN: 250.0000 mL | INTRAVENOUS | Status: DC | PRN

## 2018-10-18 MED ORDER — SODIUM CHLORIDE 0.9% FLUSH
3.0000 mL | Freq: Two times a day (BID) | INTRAVENOUS | Status: DC
  Administered 2018-10-18: 3 mL via INTRAVENOUS

## 2018-10-18 MED ORDER — MIDAZOLAM HCL 2 MG/2ML IJ SOLN
INTRAMUSCULAR | Status: AC
  Filled 2018-10-18: qty 2

## 2018-10-18 MED ORDER — ACETAMINOPHEN 325 MG PO TABS
650.0000 mg | ORAL_TABLET | ORAL | Status: DC | PRN
  Administered 2018-10-18 (×2): 650 mg via ORAL
  Filled 2018-10-18: qty 2

## 2018-10-18 MED ORDER — AZILSARTAN-CHLORTHALIDONE 40-25 MG PO TABS: 1.0000 | ORAL_TABLET | Freq: Every day | ORAL | Status: DC

## 2018-10-18 MED ORDER — CLOPIDOGREL BISULFATE 300 MG PO TABS
ORAL_TABLET | ORAL | Status: AC
  Filled 2018-10-18: qty 1

## 2018-10-18 MED ORDER — ASPIRIN EC 81 MG PO TBEC
81.0000 mg | DELAYED_RELEASE_TABLET | Freq: Every day | ORAL | Status: DC
  Administered 2018-10-19: 81 mg via ORAL
  Filled 2018-10-18: qty 1

## 2018-10-18 MED ORDER — LIDOCAINE HCL (PF) 1 % IJ SOLN
INTRAMUSCULAR | Status: AC
  Filled 2018-10-18: qty 30

## 2018-10-18 MED ORDER — SODIUM CHLORIDE 0.9 % WEIGHT BASED INFUSION
3.0000 mL/kg/h | INTRAVENOUS | Status: DC
  Administered 2018-10-18: 3 mL/kg/h via INTRAVENOUS

## 2018-10-18 MED ORDER — SODIUM CHLORIDE 0.9 % IV SOLN
INTRAVENOUS | Status: AC
  Administered 2018-10-18: 19:00:00 via INTRAVENOUS

## 2018-10-18 MED ORDER — ACETAMINOPHEN 325 MG PO TABS
ORAL_TABLET | ORAL | Status: AC
  Filled 2018-10-18: qty 2

## 2018-10-18 MED ORDER — FENTANYL CITRATE (PF) 100 MCG/2ML IJ SOLN
INTRAMUSCULAR | Status: DC | PRN
  Administered 2018-10-18 (×2): 25 ug via INTRAVENOUS

## 2018-10-18 MED ORDER — HEPARIN SODIUM (PORCINE) 1000 UNIT/ML IJ SOLN
INTRAMUSCULAR | Status: DC | PRN
  Administered 2018-10-18: 3000 [IU] via INTRAVENOUS
  Administered 2018-10-18: 8000 [IU] via INTRAVENOUS

## 2018-10-18 MED ORDER — ROSUVASTATIN CALCIUM 20 MG PO TABS
40.0000 mg | ORAL_TABLET | Freq: Every day | ORAL | Status: DC
  Administered 2018-10-18 – 2018-10-19 (×2): 40 mg via ORAL
  Filled 2018-10-18 (×2): qty 2

## 2018-10-18 MED ORDER — IODIXANOL 320 MG/ML IV SOLN
INTRAVENOUS | Status: DC | PRN
  Administered 2018-10-18: 175 mL via INTRA_ARTERIAL

## 2018-10-18 MED ORDER — METFORMIN HCL 500 MG PO TABS: 1000.0000 mg | ORAL_TABLET | Freq: Two times a day (BID) | ORAL | Status: DC

## 2018-10-18 MED ORDER — HYDRALAZINE HCL 20 MG/ML IJ SOLN
5.0000 mg | INTRAMUSCULAR | Status: DC | PRN
  Filled 2018-10-18: qty 1

## 2018-10-18 MED ORDER — HEPARIN (PORCINE) IN NACL 1000-0.9 UT/500ML-% IV SOLN
INTRAVENOUS | Status: DC | PRN
  Administered 2018-10-18 (×2): 500 mL

## 2018-10-18 MED ORDER — IRBESARTAN 150 MG PO TABS
300.0000 mg | ORAL_TABLET | Freq: Every day | ORAL | Status: DC
  Administered 2018-10-19: 300 mg via ORAL
  Filled 2018-10-18: qty 2

## 2018-10-18 MED ORDER — LABETALOL HCL 5 MG/ML IV SOLN
INTRAVENOUS | Status: AC
  Filled 2018-10-18: qty 4

## 2018-10-18 MED ORDER — HYDRALAZINE HCL 20 MG/ML IJ SOLN
INTRAMUSCULAR | Status: AC
  Filled 2018-10-18: qty 1

## 2018-10-18 MED ORDER — ONDANSETRON HCL 4 MG/2ML IJ SOLN: 4.0000 mg | Freq: Four times a day (QID) | INTRAMUSCULAR | Status: DC | PRN

## 2018-10-18 MED ORDER — MIDAZOLAM HCL 2 MG/2ML IJ SOLN
INTRAMUSCULAR | Status: DC | PRN
  Administered 2018-10-18 (×2): 1 mg via INTRAVENOUS

## 2018-10-18 MED ORDER — CHLORTHALIDONE 25 MG PO TABS
25.0000 mg | ORAL_TABLET | Freq: Every day | ORAL | Status: DC
  Administered 2018-10-19: 25 mg via ORAL
  Filled 2018-10-18: qty 1

## 2018-10-18 MED ORDER — INSULIN GLARGINE 100 UNIT/ML ~~LOC~~ SOLN
48.0000 [IU] | Freq: Every day | SUBCUTANEOUS | Status: DC
  Filled 2018-10-18: qty 0.48

## 2018-10-18 MED ORDER — HYDRALAZINE HCL 20 MG/ML IJ SOLN
INTRAMUSCULAR | Status: DC | PRN
  Administered 2018-10-18 (×2): 10 mg via INTRAVENOUS

## 2018-10-18 MED ORDER — SODIUM CHLORIDE 0.9 % WEIGHT BASED INFUSION: 1.0000 mL/kg/h | INTRAVENOUS | Status: DC

## 2018-10-18 MED ORDER — LABETALOL HCL 5 MG/ML IV SOLN
10.0000 mg | INTRAVENOUS | Status: DC | PRN
  Administered 2018-10-18: 10 mg via INTRAVENOUS

## 2018-10-18 MED ORDER — ATORVASTATIN CALCIUM 80 MG PO TABS: 80.0000 mg | ORAL_TABLET | Freq: Every day | ORAL | Status: DC

## 2018-10-18 MED ORDER — LIDOCAINE HCL (PF) 1 % IJ SOLN
INTRAMUSCULAR | Status: DC | PRN
  Administered 2018-10-18: 28 mL

## 2018-10-18 MED ORDER — CLOPIDOGREL BISULFATE 300 MG PO TABS
ORAL_TABLET | ORAL | Status: DC | PRN
  Administered 2018-10-18: 300 mg via ORAL

## 2018-10-18 MED ORDER — ASPIRIN 81 MG PO CHEW: 81.0000 mg | CHEWABLE_TABLET | ORAL | Status: DC

## 2018-10-18 MED ORDER — ASPIRIN EC 81 MG PO TBEC: 81.0000 mg | DELAYED_RELEASE_TABLET | Freq: Every day | ORAL | Status: DC

## 2018-10-18 SURGICAL SUPPLY — 23 items
BALLN COYOTE ES OTW 2.5X40X145 (BALLOONS) ×3
BALLN IN.PACT DCB 5X150 (BALLOONS) ×3
BALLOON CYTE ES OTW 2.5X40X145 (BALLOONS) IMPLANT
CATH ANGIO 5F PIGTAIL 65CM (CATHETERS) ×1 IMPLANT
CATH CROSS OVER TEMPO 5F (CATHETERS) ×1 IMPLANT
CATH HAWKONE LX EXTENDED TIP (CATHETERS) ×1 IMPLANT
DCB IN.PACT 5X150 (BALLOONS) IMPLANT
DEVICE SPIDERFX EMB PROT 6MM (WIRE) ×1 IMPLANT
KIT ENCORE 26 ADVANTAGE (KITS) ×1 IMPLANT
KIT PV (KITS) ×3 IMPLANT
SHEATH HIGHFLEX ANSEL 7FR 55CM (SHEATH) ×1 IMPLANT
SHEATH PINNACLE 5F 10CM (SHEATH) ×1 IMPLANT
SHEATH PINNACLE 7F 10CM (SHEATH) ×1 IMPLANT
SHEATH PROBE COVER 6X72 (BAG) ×1 IMPLANT
SYR MEDRAD MARK 7 150ML (SYRINGE) ×3 IMPLANT
TAPE VIPERTRACK RADIOPAQ (MISCELLANEOUS) IMPLANT
TAPE VIPERTRACK RADIOPAQUE (MISCELLANEOUS) ×3
TRANSDUCER W/STOPCOCK (MISCELLANEOUS) ×3 IMPLANT
TRAY PV CATH (CUSTOM PROCEDURE TRAY) ×3 IMPLANT
TUBING CIL FLEX 10 FLL-RA (TUBING) ×1 IMPLANT
WIRE HITORQ VERSACORE ST 145CM (WIRE) ×2 IMPLANT
WIRE ROSEN-J .035X260CM (WIRE) ×1 IMPLANT
WIRE SPARTACORE .014X300CM (WIRE) ×1 IMPLANT

## 2018-10-18 NOTE — Interval H&P Note (Signed)
History and Physical Interval Note:  10/18/2018 9:13 AM  Mary Campbell  has presented today for surgery, with the diagnosis of PAD.  The various methods of treatment have been discussed with the patient and family. After consideration of risks, benefits and other options for treatment, the patient has consented to  Procedure(s): ABDOMINAL AORTOGRAM W/LOWER EXTREMITY (Right) as a surgical intervention.  The patient's history has been reviewed, patient examined, no change in status, stable for surgery.  I have reviewed the patient's chart and labs.  Questions were answered to the patient's satisfaction.     Quay Burow

## 2018-10-18 NOTE — Progress Notes (Signed)
Site area: right groin  Site Prior to Removal:  Level 0  Pressure Applied For 22 MINUTES    Minutes Beginning at 1253  Manual:   Yes.    Patient Status During Pull:  stable  Post Pull Groin Site:  Level0; 0  Post Pull Instructions Given:  Yes.    Post Pull Pulses Present:  Yes.    Dressing Applied:  Yes.    Comments:  6 hr bed rest.

## 2018-10-19 ENCOUNTER — Encounter (HOSPITAL_COMMUNITY): Payer: Self-pay | Admitting: Cardiovascular Disease

## 2018-10-19 ENCOUNTER — Other Ambulatory Visit: Payer: Self-pay | Admitting: Physician Assistant

## 2018-10-19 DIAGNOSIS — E1151 Type 2 diabetes mellitus with diabetic peripheral angiopathy without gangrene: Secondary | ICD-10-CM | POA: Diagnosis not present

## 2018-10-19 DIAGNOSIS — I739 Peripheral vascular disease, unspecified: Secondary | ICD-10-CM | POA: Diagnosis not present

## 2018-10-19 DIAGNOSIS — N179 Acute kidney failure, unspecified: Secondary | ICD-10-CM

## 2018-10-19 DIAGNOSIS — I4819 Other persistent atrial fibrillation: Secondary | ICD-10-CM

## 2018-10-19 DIAGNOSIS — N189 Chronic kidney disease, unspecified: Secondary | ICD-10-CM

## 2018-10-19 LAB — BASIC METABOLIC PANEL
Anion gap: 8 (ref 5–15)
BUN: 7 mg/dL (ref 6–20)
CO2: 27 mmol/L (ref 22–32)
Calcium: 8.7 mg/dL — ABNORMAL LOW (ref 8.9–10.3)
Chloride: 105 mmol/L (ref 98–111)
Creatinine, Ser: 0.91 mg/dL (ref 0.44–1.00)
GFR calc Af Amer: >60 mL/min (ref >60)
GFR calc non Af Amer: >60 mL/min (ref >60)
Glucose, Bld: 158 mg/dL — ABNORMAL HIGH (ref 70–99)
Potassium: 3.5 mmol/L (ref 3.5–5.1)
Sodium: 140 mmol/L (ref 135–145)

## 2018-10-19 LAB — CBC
HCT: 32.6 % — ABNORMAL LOW (ref 36.0–46.0)
Hemoglobin: 11 g/dL — ABNORMAL LOW (ref 12.0–15.0)
MCH: 30.1 pg (ref 26.0–34.0)
MCHC: 33.7 g/dL (ref 30.0–36.0)
MCV: 89.1 fL (ref 80.0–100.0)
Platelets: 264 10*3/uL (ref 150–400)
RBC: 3.66 MIL/uL — ABNORMAL LOW (ref 3.87–5.11)
RDW: 14.7 % (ref 11.5–15.5)
WBC: 11 10*3/uL — ABNORMAL HIGH (ref 4.0–10.5)
nRBC: 0 % (ref 0.0–0.2)

## 2018-10-19 MED ORDER — CLOPIDOGREL BISULFATE 75 MG PO TABS: 75.0000 mg | ORAL_TABLET | Freq: Every day | ORAL | 3 refills | Status: DC

## 2018-10-19 NOTE — Discharge Summary (Deleted)
Note type entered in error. ° °

## 2018-10-19 NOTE — Discharge Summary (Addendum)
Discharge Summary    Patient ID: Mary Campbell MRN: [id number]; DOB: 09/16/1965  Admit date: 10/18/2018 Discharge date: 10/19/2018  Primary Care Provider: Jilda Panda, MD  Primary Cardiologist: Evalina Field, MD  PV: Dr. Quay Burow Primary Electrophysiologist:  None   Discharge Diagnoses    Principal Problem:   Claudication in peripheral vascular disease [facility]) Active Problems:   Essential hypertension   Hyperlipidemia   Tobacco abuse  Allergies Allergies  Allergen Reactions   Codeine Hives   Lisinopril Cough   Sulfonamide Derivatives Hives    Diagnostic Studies/Procedures     PV Angiogram/Intervention  Pre Procedure Diagnosis: Peripheral arterial disease  Post Procedure Diagnosis: Peripheral arterial disease  Operators: Dr. Quay Burow  Procedures Performed:               1.  Ultrasound-guided right common femoral access               2.  Abdominal aortogram/bilateral iliac angiogram/bifemoral runoff               3.  Contralateral access (second order catheter placement)               4.  Placement of a spider distal protection device distal left SFA               5.  Hawk 1 directional atherectomy followed by drug-coated balloon angioplasty proximal left SFA  PROCEDURE DESCRIPTION:   The patient was brought to the second floor [facility] cath lab in the the postabsorptive state. She was premedicated with IV Versed and fentanyl. Her right groin was prepped and shaved in usual sterile fashion. Xylocaine 1% was used for local anesthesia. A 5 French sheath was inserted into the right common femoral artery using standard Seldinger technique under ultrasound guidance (image was captured in the chart).  A 5 French pigtail catheter was placed in the distal abdominal aorta.  Distal abdominal aortography, bilateral iliac angiography with bifemoral runoff was performed using bolus chase, digital subtraction and step table technique.  Omnipaque dye was  used for the entirety of the case.  Retrograde aortic pressure was monitored in the case.    Angiographic Data:   1: Abdominal aorta- renal arteries widely patent.  The infrarenal abdominal aorta was free of atherosclerotic changes 2: Left lower extremity- short subtotal total occlusion proximal left SFA followed by a 60/80% segmental lesion just beyond this with three-vessel runoff 3: Right lower extremity- short CTO right above-the-knee popliteal artery with three-vessel runoff  IMPRESSION: Mary Campbell has bilateral SFA/popliteal disease.  She symptomatic bilaterally.  We will proceed with left SFA directional atherectomy with drug-coated balloon angioplasty using distal protection  Procedure Description: The patient received 11,000 as of heparin with an ACT of 252.  A total of 175 cc of contrast was administered to the patient.  Contralateral access was obtained with a crossover catheter, Rosen wire and 7 [place] multipurpose 55 cm Ansell sheath.  I crossed both proximal lesions with an 014 [place] core wire, and balloon the proximal subtotal lesion with a 2.5 mm x 4 mm Sterling balloon.  I then placed a 6 mm spider distal protection device in the distal left SFA.  Following this I performed directional atherectomy with a Hawk 1 LX device of both tandem lesions removing a copious amount of atherosclerotic plaque.  I then performed drug-coated balloon angioplasty using a 5 mm x 150 mm long impact Admiral balloon at 4 to 6 atm for 20 half  minutes resulting in reduction of a subtotal short proximal left SFA stenosis to less than 20% residual and 0% residual in the stent sequential stenosis.  There did appear to be some mild plaque in the proximal stenosis probably either on the anterior posterior wall but there was complete balloon expansion.  Final Impression: Successful left SFA directional arthrectomy followed by drug-coated balloon angioplasty using distal protection.  The patient did receive  300 mg of Plavix.  The Ansell sheath was switched over an 035 wire for a short 7 [place] sheath which was then secured in place.  The patient left lab in stable condition.  The sheath will be removed once the ACT falls below 170 and pressure held.  Patient will be hydrated overnight, discharged home in the morning.  We will obtain lower extremity arterial Doppler studies in our [facility] line office next week and I will see her back 1 to 2 weeks thereafter.  She left the lab in stable condition.  Quay Burow. MD, [facility], [facility], [facility] 10/18/2018 10:43 AM   _____________   History of Present Illness     Mary Campbell is a 53 y/o F with history of tobacco abuse, obesity, HTN, HLD, DM, family history of CAD who presented to the hospital for planned PV angiography. She recently saw Dr. Audie Box for history of chest pain and had an abnormal stress test. She also complained of bilateral claudication and had abnormal outpatient noninvasive testing, so outpatient PV angiogram was arranged.  Hospital Course     She was brought in for planned PV angiography which demonstrated the above findings with bilateral SFA disease. She ultimately underwent successful left SFA directional arthrectomy followed by drug-coated balloon angioplasty using distal protection. She was started on Plavix. BP was elevated post-procedurally but pt was instructed to hold Edarbyclor pre-cath which explains the jump. She's been monitoring at home given recent adjustments by PCP with BP 120s/60s, so medication was resumed this AM - anticipate resolution back to baseline control. She had a mild dip in Hgb from 12.5->11 with mild elevation WBC but no acute clinical changes in patient. Bleeding precautions reviewed. It appears that Dr. Gwenlyn Found has already arranged OP duplex for 10/6 and appointment 10/27 which we will keep. Dr. Gwenlyn Found will determine if/when she will require right sided intervention. She also has f/u with Dr. Audie Box this month as well. She has been up  ambulating today without issues with cath site, chest pain or SOB. Importance of tobacco cessation reinforced. She has been cigarette free for 3 days now and hopeful to stay that way. She requested to use usual pharmacy instead of TOC. She was asked to hold metformin for at least 48 hours post-cath, to resume 10/4. Dr. Burt Knack has seen and examined the patient today and feels she is stable for discharge. Work note given to RTW in 1 week per our discussion.  _____________  Discharge Vitals Blood pressure (!) 160/64, pulse 62, temperature 97.9 F (36.6 C), temperature source Oral, resp. rate 17, height '5\' 5"'  (1.651 m), weight 82.4 kg, SpO2 97 %.  Filed Weights   10/18/18 0745 10/19/18 0350  Weight: 83.5 kg 82.4 kg   Vital Signs. BP (!) 147/58 (BP Location: Right Arm)    Pulse 62    Temp 97.9 F (36.6 C) (Oral)    Resp 17    Ht '5\' 5"'  (1.651 m)    Wt 82.4 kg    SpO2 97%    BMI 30.23 kg/m   Tele: NSR General: Well developed,  well nourished WF, in no acute distress Head: Normocephalic, atraumatic, sclera non-icteric, no xanthomas, nares are without discharge. Neck: Negative for carotid bruits. JVP not elevated. Lungs: Clear bilaterally to auscultation without wheezes, rales, or rhonchi. Breathing is unlabored. Heart: RRR S1 S2 without murmurs, rubs, or gallops.  Abdomen: Soft, non-tender, non-distended with normoactive bowel sounds. No rebound/guarding. Extremities: No clubbing or cyanosis. No edema. Distal pedal pulses are 2+ and equal bilaterally. Right groin cath site without hematoma, ecchymosis, or bruit. Neuro: Alert and oriented X 3. Moves all extremities spontaneously. Psych:  Responds to questions appropriately with a normal affect.  Labs & Radiologic Studies    CBC Recent Labs    10/19/18 0407  WBC 11.0*  HGB 11.0*  HCT 32.6*  MCV 89.1  PLT 161   Basic Metabolic Panel Recent Labs    10/19/18 0407  NA 140  K 3.5  CL 105  CO2 27  GLUCOSE 158*  BUN 7  CREATININE 0.91    CALCIUM 8.7*  _____________  [place] Ram [place] Le Art Seg Multi (segm&le Reynauds)  Result Date: 09/26/2018 LOWER EXTREMITY DOPPLER STUDY Indications: Claudication. Patient reports symptoms of leg cramps and weakness              after activity/walking. She walks a lot at work and they start to              bother her after walking about the distance from [employer] to this              office. Recently her shortness of breath has bothered her more, but              the leg discomfort is still present. High Risk Factors: Hypertension, Diabetes, current smoker.  Performing Technologist: Mariane Masters RVT  Examination Guidelines: A complete evaluation includes at minimum, Doppler waveform signals and systolic blood pressure reading at the level of bilateral brachial, anterior tibial, and posterior tibial arteries, when vessel segments are accessible. Bilateral testing is considered an integral part of a complete examination. Photoelectric Plethysmograph (PPG) waveforms and toe systolic pressure readings are included as required and additional duplex testing as needed. Limited examinations for reoccurring indications may be performed as noted.  ABI Findings: +---------+------------------+-----+----------+--------+  Right     Rt Pressure (mmHg) Index Waveform   Comment   +---------+------------------+-----+----------+--------+  Brachial  191                                           +---------+------------------+-----+----------+--------+  CFA                                biphasic             +---------+------------------+-----+----------+--------+  Popliteal                          monophasic           +---------+------------------+-----+----------+--------+  ATA       124                0.65  monophasic           +---------+------------------+-----+----------+--------+  PTA       131                0.69  monophasic           +---------+------------------+-----+----------+--------+  PERO      116                0.61   monophasic           +---------+------------------+-----+----------+--------+  Great Toe 29                 0.15                       +---------+------------------+-----+----------+--------+ +---------+------------------+-----+----------+-------+  Left      Lt Pressure (mmHg) Index Waveform   Comment  +---------+------------------+-----+----------+-------+  Brachial  191                                          +---------+------------------+-----+----------+-------+  CFA                                biphasic            +---------+------------------+-----+----------+-------+  Popliteal                          monophasic          +---------+------------------+-----+----------+-------+  ATA       115                0.60  biphasic            +---------+------------------+-----+----------+-------+  PTA       130                0.68  monophasic          +---------+------------------+-----+----------+-------+  PERO      130                0.68  monophasic          +---------+------------------+-----+----------+-------+  Great Toe 60                 0.31                      +---------+------------------+-----+----------+-------+ +-------+-----------+-----------+------------+------------+  ABI/TBI Today's ABI Today's TBI Previous ABI Previous TBI  +-------+-----------+-----------+------------+------------+  Right   0.69        0.15                                   +-------+-----------+-----------+------------+------------+  Left    0.68        0.31                                   +-------+-----------+-----------+------------+------------+  Summary: Right: Resting right ankle-brachial index indicates moderate right lower extremity arterial disease. The right toe-brachial index is abnormal. Left: Resting left ankle-brachial index indicates moderate left lower extremity arterial disease. The left toe-brachial index is abnormal.  *See table(s) above for measurements and observations. See arterial duplex report.   Vascular consult recommended. Electronically signed by Quay Burow MD on 09/26/2018 at 5:49:38 PM.    Final    Vas [place] Lower Extremity Arterial Duplex  Result Date: 09/28/2018 LOWER EXTREMITY ARTERIAL DUPLEX STUDY Indications: Claudication. Patient reports symptoms of leg cramps and weakness  after activity/walking. She walks a lot at work and they start to              bother her after walking about the distance from [employer] to this              office. Recently her shortness of breath has bothered her more, but              the leg discomfort is still present. High Risk Factors: Hypertension, Diabetes, current smoker.  Current ABI: Today's ABIs were 0.69 on the right and 0.68 on the left. Performing Technologist: Mariane Masters RVT  Examination Guidelines: A complete evaluation includes B-mode imaging, spectral Doppler, color Doppler, and power Doppler as needed of all accessible portions of each vessel. Bilateral testing is considered an integral part of a complete examination. Limited examinations for reoccurring indications may be performed as noted.  +-----------+--------+-----+--------+----------+----------------------+  RIGHT       PSV cm/s Ratio Stenosis Waveform   Comments                +-----------+--------+-----+--------+----------+----------------------+  CFA Prox    133                     biphasic                           +-----------+--------+-----+--------+----------+----------------------+  DFA         171                     triphasic                          +-----------+--------+-----+--------+----------+----------------------+  SFA Prox    137                     biphasic   plaque                  +-----------+--------+-----+--------+----------+----------------------+  SFA Mid     112                     biphasic                           +-----------+--------+-----+--------+----------+----------------------+  SFA Distal  36                      monophasic resistant thump  signal  +-----------+--------+-----+--------+----------+----------------------+  POP Prox    0              occluded            no flow detected        +-----------+--------+-----+--------+----------+----------------------+  POP Mid     43                      monophasic reconstituted flow      +-----------+--------+-----+--------+----------+----------------------+  POP Distal  45                      monophasic                         +-----------+--------+-----+--------+----------+----------------------+  TP Trunk    37                      monophasic                         +-----------+--------+-----+--------+----------+----------------------+  ATA Distal  38                      monophasic                         +-----------+--------+-----+--------+----------+----------------------+  PTA Distal  46                      biphasic                           +-----------+--------+-----+--------+----------+----------------------+  PERO Distal 30                      monophasic                         +-----------+--------+-----+--------+----------+----------------------+ There is a segmental occlusion of the proximal popliteal artery with reconstitution in the mid/distal segment.  +-----------+--------+-----+---------------+--------+------------------------+  LEFT        PSV cm/s Ratio Stenosis        Waveform Comments                  +-----------+--------+-----+---------------+--------+------------------------+  CFA Prox    150            30-49% stenosis biphasic plaque                    +-----------+--------+-----+---------------+--------+------------------------+  DFA         309                            biphasic elevated velocities       +-----------+--------+-----+---------------+--------+------------------------+  SFA Prox    491            75-99% stenosis stenotic plaque, narrowing, bruit  +-----------+--------+-----+---------------+--------+------------------------+  SFA Mid     126                             biphasic                           +-----------+--------+-----+---------------+--------+------------------------+  SFA Distal  358            50-74% stenosis biphasic plaque/narrowing          +-----------+--------+-----+---------------+--------+------------------------+  POP Prox    117                            biphasic                           +-----------+--------+-----+---------------+--------+------------------------+  POP Distal  147                            biphasic plaque                    +-----------+--------+-----+---------------+--------+------------------------+  TP Trunk    55                             biphasic                           +-----------+--------+-----+---------------+--------+------------------------+  ATA Distal  32                             biphasic                           +-----------+--------+-----+---------------+--------+------------------------+  PTA Distal  47                             biphasic                           +-----------+--------+-----+---------------+--------+------------------------+  PERO Distal 31                             biphasic                           +-----------+--------+-----+---------------+--------+------------------------+ A focal velocity elevation of 491 cm/s was obtained at SFA proximal, distal to the ostium with post stenotic turbulence with a VR of 4.9. Findings are characteristic of 75-99% stenosis. A 2nd focal velocity elevation was visualized, measuring 358 cm/s at  SFA distal with post stenotic turbulence with a VR of 3.31. Findings are characteristic of 50-74% stenosis.  Summary: Right: Total occlusion noted in the proximal popliteal artery/distal thigh area with reconstituted flow in the mid/distal popliteal. Left: 75-99% stenosis noted in the proximal superficial femoral artery, distal to the ostium, followed by a 50-74% stenosis noted in the distal superficial femoral artery.  See table(s) above for measurements and  observations. See ABI report. Vascular consult recommended. Electronically signed by Quay Burow MD on 09/26/2018 at 5:47:54 PM.    Final (Updated)    Disposition   Pt is being discharged home today in good condition.  Follow-up Plans & Appointments    Follow-up Information    [facility] Follow up.   Specialty: Cardiology Why: Keep follow-up appointments as below, with follow-up lower extremity ultrasound on 10/23/18 at 1pm, as well as appointments with Dr. Gwenlyn Found and Dr. Audie Box as outlined. Contact information: [address] [place] [phone]         Discharge Instructions    Diet - low sodium heart healthy   Complete by: As directed    Discharge instructions   Complete by: As directed    Please do not take any Metformin for 48 hours after your heart cath. You may restart Sunday 10/21/18.  Lorazepam was discontinued from medicine list since this was a one-time dose and you reported you were not taking this medicine regularly.   Increase activity slowly   Complete by: As directed    No driving for 2 days. No lifting over 5 lbs for 1 week. No sexual activity for 1 week. Keep procedure site clean & dry. If you notice increased pain, swelling, bleeding or pus, call/return!  You may shower, but no soaking baths/hot tubs/pools for 1 week. You may return to work in 1 week if feeling well.      Discharge Medications   Allergies as of 10/19/2018      Reactions   Codeine Hives   Lisinopril Cough   Sulfonamide Derivatives Hives      Medication List    STOP taking these medications   LORazepam 1 MG tablet Commonly known  as: Ativan     TAKE these medications   acetaminophen 500 MG tablet Commonly known as: TYLENOL Take 1,000 mg by mouth every 6 (six) hours as needed for moderate pain or headache.   aspirin EC 81 MG tablet Take 81 mg by mouth daily.   Chantix Starting Month Pak 0.5 MG X 11 & 1 MG X 42 tablet Generic  drug: varenicline Take 0.5-1 mg by mouth See admin instructions. Take one 0.5 mg tablet by mouth once daily for 3 days, then increase to one 0.5 mg tablet twice daily for 4 days, then increase to one 1 mg tablet twice daily.   clopidogrel 75 MG tablet Commonly known as: PLAVIX Take 1 tablet (75 mg total) by mouth daily.   diphenhydrAMINE 25 MG tablet Commonly known as: BENADRYL Take 25 mg by mouth daily as needed for itching or allergies.   Edarbyclor 40-25 MG Tabs Generic drug: Azilsartan-Chlorthalidone Take 1 tablet by mouth daily.   metFORMIN 1000 MG tablet Commonly known as: GLUCOPHAGE Take 1,000 mg by mouth 2 (two) times daily. Notes to patient: Please do not take any Metformin for 48 hours after your heart cath. You may restart Sunday 10/21/18.   rosuvastatin 40 MG tablet Commonly known as: CRESTOR Take 1 tablet (40 mg total) by mouth daily. What changed: when to take this   Toujeo SoloStar 300 UNIT/ML Sopn Generic drug: Insulin Glargine (1 Unit Dial) Inject 48 Units into the skin at bedtime.        Acute coronary syndrome (MI, NSTEMI, STEMI, etc) this admission?: No.    Outstanding Labs/Studies   N/A  Duration of Discharge Encounter   Greater than 30 minutes including physician time.  Signed, Charlie Pitter, PA-C 10/19/2018, 8:49 AM  Patient seen, examined. Available data reviewed. Agree with findings, assessment, and plan as outlined by Melina Copa, PA-C.  The patient is independently interviewed and examined.  On my examination, she is alert, oriented, in no distress.  Heart is regular rate and rhythm, lungs are clear, right groin site is clear, there is no pretibial edema.  She has done well after peripheral vascular intervention yesterday.  Her labs are stable.  She will be discharged home today with plan to follow-up with Dr. Gwenlyn Found.  Sherren Mocha, M.D. 10/19/2018 11:42 AM

## 2018-10-19 NOTE — Progress Notes (Addendum)
   History reviewed, discharge summary started. 70F with tobacco abuse, obesity, HTN, HLD, DM, family history of CAD who presented to the hospital for planned PV angiography given claudication and abnormal outpatient ABIs. Underwent underwent successful left SFA directional arthrectomy. Plavix was added to regimen. Will plan to hold Metformin for 48 hours post angio. It appears that Dr. Gwenlyn Found has already arranged OP duplex for 10/6 and f/u 10/22 with Dr. Audie Box and 10/27 with Dr. Gwenlyn Found. Will plan to keep these appts unless Dr. Burt Knack feels duplex is too soon. BP elevated this AM but pt was instructed to hold Edarbyclor pre-cath which explains the jump. She's been monitoring at home given recent adjustments by PCP with BP 120s/60s. Mild dip in Hgb from 12.5->11 with mild elevation WBC but no acute clinical changes in patient. Anticipate DC today, DC summary started under incomplete.  MD to advise on when to RTW - works in [employer]. Dmoni Fortson PA-C

## 2018-10-22 ENCOUNTER — Ambulatory Visit: Payer: BC Managed Care – PPO | Admitting: Cardiovascular Disease

## 2018-10-23 ENCOUNTER — Ambulatory Visit (HOSPITAL_COMMUNITY)
Admission: RE | Admit: 2018-10-23 | Discharge: 2018-10-23 | Disposition: A | Discharge status: Home / Self Care | Payer: BC Managed Care – PPO | Source: Ambulatory Visit | Attending: Cardiovascular Disease | Admitting: Cardiovascular Disease

## 2018-10-23 ENCOUNTER — Other Ambulatory Visit (HOSPITAL_COMMUNITY): Payer: Self-pay | Admitting: Cardiovascular Disease

## 2018-10-23 ENCOUNTER — Other Ambulatory Visit: Payer: Self-pay

## 2018-10-23 DIAGNOSIS — I739 Peripheral vascular disease, unspecified: Secondary | ICD-10-CM | POA: Diagnosis present

## 2018-10-24 ENCOUNTER — Other Ambulatory Visit: Payer: Self-pay | Admitting: *Deleted

## 2018-10-24 DIAGNOSIS — I739 Peripheral vascular disease, unspecified: Secondary | ICD-10-CM

## 2018-10-25 ENCOUNTER — Telehealth: Payer: Self-pay | Admitting: Cardiovascular Disease

## 2018-10-25 DIAGNOSIS — I10 Essential (primary) hypertension: Secondary | ICD-10-CM

## 2018-10-25 DIAGNOSIS — R110 Nausea: Secondary | ICD-10-CM

## 2018-10-25 DIAGNOSIS — R42 Dizziness and giddiness: Secondary | ICD-10-CM

## 2018-10-25 NOTE — Telephone Encounter (Signed)
Pt c/o medication issue:  1. Name of Medication: clopidogrel (PLAVIX) 75 MG tablet  2. How are you currently taking this medication (dosage and times per day)? 75 mg once daily  3. Are you having a reaction (difficulty breathing--STAT)?   4. What is your medication issue? Patient is dizzy and nauseated. She wonders if the medication would make her BP drop  STAT if patient feels like he/she is going to faint   1) Are you dizzy now? When she gets up and moves around.   2) Do you feel faint or have you passed out? no  3) Do you have any other symptoms? Nausea, low BP  4) Have you checked your HR and BP (record if available)? 98/55   Patient was laying down at the time of the phone call. She states whenever she gets up and moves around, she gets dizzy and feels nauseated. This just started after her recent procedure

## 2018-10-25 NOTE — Telephone Encounter (Signed)
Spoke with patient and she has been having dizziness and nausea, worse up moving around. She is trying to eat and drink plenty, hard with the nausea. Blood pressure today 98/55, yesterday 100/51, and 104/60. She has not had her Edarbycor the last few days. Per patient no leg/foot pain, site from [facility]ogram[facility]vention 10/18/18. Discussed with Dr Harrell Gave and will have patient come today for BMET/CBC, continue off Edarycor, and follow up with PCP if labs ok. Advised patient and she will try come today for labs.

## 2018-10-26 ENCOUNTER — Telehealth: Payer: Self-pay | Admitting: *Deleted

## 2018-10-26 DIAGNOSIS — N289 Disorder of kidney and ureter, unspecified: Secondary | ICD-10-CM

## 2018-10-26 DIAGNOSIS — I10 Essential (primary) hypertension: Secondary | ICD-10-CM

## 2018-10-26 LAB — CBC WITH DIFFERENTIAL/PLATELET
Basophils Absolute: 0.1 10*3/uL (ref 0.0–0.2)
Basos: 1 %
EOS (ABSOLUTE): 0.2 10*3/uL (ref 0.0–0.4)
Eos: 2 %
Hematocrit: 37.2 % (ref 34.0–46.6)
Hemoglobin: 12.4 g/dL (ref 11.1–15.9)
Immature Grans (Abs): 0 10*3/uL (ref 0.0–0.1)
Immature Granulocytes: 0 %
Lymphocytes Absolute: 2.5 10*3/uL (ref 0.7–3.1)
Lymphs: 22 %
MCH: 29 pg (ref 26.6–33.0)
MCHC: 33.3 g/dL (ref 31.5–35.7)
MCV: 87 fL (ref 79–97)
Monocytes Absolute: 0.5 10*3/uL (ref 0.1–0.9)
Monocytes: 4 %
Neutrophils Absolute: 8.2 10*3/uL — ABNORMAL HIGH (ref 1.4–7.0)
Neutrophils: 71 %
Platelets: 357 10*3/uL (ref 150–450)
RBC: 4.27 x10E6/uL (ref 3.77–5.28)
RDW: 14.5 % (ref 11.7–15.4)
WBC: 11.5 10*3/uL — ABNORMAL HIGH (ref 3.4–10.8)

## 2018-10-26 LAB — BASIC METABOLIC PANEL
BUN/Creatinine Ratio: 12 (ref 9–23)
BUN: 12 mg/dL (ref 6–24)
CO2: 26 mmol/L (ref 20–29)
Calcium: 9.4 mg/dL (ref 8.7–10.2)
Chloride: 91 mmol/L — ABNORMAL LOW (ref 96–106)
Creatinine, Ser: 1.01 mg/dL — ABNORMAL HIGH (ref 0.57–1.00)
GFR calc Af Amer: 72 mL/min/{1.73_m2} (ref >59)
GFR calc non Af Amer: 63 mL/min/{1.73_m2} (ref >59)
Glucose: 356 mg/dL — ABNORMAL HIGH (ref 65–99)
Potassium: 4.2 mmol/L (ref 3.5–5.2)
Sodium: 130 mmol/L — ABNORMAL LOW (ref 134–144)

## 2018-10-26 NOTE — Telephone Encounter (Signed)
Spoke with patient and she is feeling better. Blood 106/55 today, drinking more and able to eat little. Discussed with Margaretha Sheffield, MD and will have patient repeat labs on Monday and go to ED if does not continue to improve or gets worse. Ok to take Metformin as long as she is eating, if appetite decreases hold metformin and contact PCP. Advised patient, verbalized understanding.

## 2018-10-26 NOTE — Telephone Encounter (Signed)
-----   Message from Buford Dresser, MD sent at 10/26/2018  8:24 AM EDT ----- Blood counts are normal and actually a bit better than a week ago, so I don't think it is a bleeding issue. However, her sugar is very high, which has impacted her kidney electrolytes. I am concerned as it suggests that there might be something called an anion gap, which can be dangerous if it progresses. If she is still feeling poorly, I recommend she come to the ER to have additional labs checked to make sure her labs are improving. She may need IV fluids and potentially some insulin. I would like her to not take her metformin, as this can worsen her lab abnormalities, but it will also make her sugar higher, so it is imperative she is somewhere like the ER where they can treat her sugar. She should not take the edarbyclor as well until she is seen. Thanks.

## 2018-10-29 NOTE — Telephone Encounter (Signed)
Result Notes for Basic metabolic panel  Notes recorded by Earvin Hansen, LPN on 46/05/352 at 6:56 PM EDT  Spoke with patient and she is feeling better. Blood 106/55 today, drinking more and able to eat little. Discussed with Margaretha Sheffield, MD and will have patient repeat labs on Monday and go to ED if does not continue to improve or gets worse. Ok to take Metformin as long as she is eating, if appetite decreases hold metformin and contact PCP. Advised patient, verbalized understanding.  ------   Notes recorded by Buford Dresser, MD on 10/26/2018 at 8:24 AM EDT  Blood counts are normal and actually a bit better than a week ago, so I don't think it is a bleeding issue. However, her sugar is very high, which has impacted her kidney electrolytes. I am concerned as it suggests that there might be something called an anion gap, which can be dangerous if it progresses. If she is still feeling poorly, I recommend she come to the ER to have additional labs checked to make sure her labs are improving. She may need IV fluids and potentially some insulin. I would like her to not take her metformin, as this can worsen her lab abnormalities, but it will also make her sugar higher, so it is imperative she is somewhere like the ER where they can treat her sugar. She should not take the edarbyclor as well until she is seen. Thanks.

## 2018-10-30 LAB — BASIC METABOLIC PANEL
BUN/Creatinine Ratio: 12 (ref 9–23)
BUN: 13 mg/dL (ref 6–24)
CO2: 26 mmol/L (ref 20–29)
Calcium: 9.4 mg/dL (ref 8.7–10.2)
Chloride: 93 mmol/L — ABNORMAL LOW (ref 96–106)
Creatinine, Ser: 1.07 mg/dL — ABNORMAL HIGH (ref 0.57–1.00)
GFR calc Af Amer: 68 mL/min/{1.73_m2} (ref >59)
GFR calc non Af Amer: 59 mL/min/{1.73_m2} — ABNORMAL LOW (ref >59)
Glucose: 342 mg/dL — ABNORMAL HIGH (ref 65–99)
Potassium: 4.5 mmol/L (ref 3.5–5.2)
Sodium: 135 mmol/L (ref 134–144)

## 2018-11-07 NOTE — Progress Notes (Deleted)
Cardiology Office Note:   Date:  11/07/2018  NAME:  Mary Campbell    MRN: [id number] DOB:  19-Jan-1965   PCP:  Ralene Ok, MD  Cardiologist:  Reatha Harps, MD  Electrophysiologist:  None   Referring MD: Ralene Ok, MD   No chief complaint on file. ***  History of Present Illness:   Mary Campbell is a 53 y.o. female with a hx of hypertension, tobacco abuse, diabetes, recently diagnosed PAD who presents for routine follow-up.  She was recently found to have bilateral PAD.  She underwent left superficial femoral artery atherectomy with balloon angioplasty on 10/1.  She was recently evaluated by me for chest pain in September and has had a normal nuclear medicine myocardial perfusion imaging study.  She did have an echocardiogram, with likely a prominent pericardial fat pad and epicardial fat.  She states she is unclear if she would be able to pursue an MRI at this time due to claustrophobia.    Past Medical History: Past Medical History:  Diagnosis Date   Chronic back pain    Diabetes mellitus    Hyperlipidemia    Hypertension    PAD (peripheral artery disease) (HCC)    a. PV angio 10/2018 - bilateral SFA disease s/p L intervention.   Tobacco abuse     Past Surgical History: Past Surgical History:  Procedure Laterality Date   ABDOMINAL AORTOGRAM W/LOWER EXTREMITY Right 10/18/2018   Procedure: ABDOMINAL AORTOGRAM W/LOWER EXTREMITY;  Surgeon: Runell Gess, MD;  Location: [facility];  Service: Cardiovascular;  Laterality: Right;   ABDOMINAL HYSTERECTOMY     CHOLECYSTECTOMY     PERIPHERAL VASCULAR ATHERECTOMY Left 10/18/2018   Procedure: PERIPHERAL VASCULAR ATHERECTOMY;  Surgeon: Runell Gess, MD;  Location: [facility];  Service: Cardiovascular;  Laterality: Left;  SFA    Current Medications: No outpatient medications have been marked as taking for the 11/08/18 encounter (Appointment) with O'Neal, Ronnald Ramp, MD.     Allergies:      Codeine, Lisinopril, and Sulfonamide derivatives   Social History: Social History   Socioeconomic History   Marital status: Legally Separated    Spouse name: Not on file   Number of children: Not on file   Years of education: Not on file   Highest education level: Not on file  Occupational History   Not on file  Social Needs   Financial resource strain: Not on file   Food insecurity    Worry: Not on file    Inability: Not on file   Transportation needs    Medical: Not on file    Non-medical: Not on file  Tobacco Use   Smoking status: Current Every Day Smoker    Packs/day: 1.00    Types: Cigarettes   Smokeless tobacco: Never Used  Substance and Sexual Activity   Alcohol use: No   Drug use: No   Sexual activity: Yes    Birth control/protection: Surgical  Lifestyle   Physical activity    Days per week: Not on file    Minutes per session: Not on file   Stress: Not on file  Relationships   Social connections    Talks on phone: Not on file    Gets together: Not on file    Attends religious service: Not on file    Active member of club or organization: Not on file    Attends meetings of clubs or organizations: Not on file    Relationship status: Not on  file  Other Topics Concern   Not on file  Social History Narrative   Not on file     Family History: The patient'sfamily history includes Diabetes in her mother; Heart attack in her father; Heart disease in her father; Hyperlipidemia in her father and mother; Hypertension in her father and mother.  ROS:   All other ROS reviewed and negative. Pertinent positives noted in the HPI.     EKGs/Labs/Other Studies Reviewed:   The following studies were personally reviewed by me today:  Abdominal aortogram 10/18/2018 1: Abdominal aorta- renal arteries widely patent.  The infrarenal abdominal aorta was free of atherosclerotic changes 2: Left lower extremity- short subtotal total occlusion proximal left SFA  followed by a 60/80% segmental lesion just beyond this with three-vessel runoff 3: Right lower extremity- short CTO right above-the-knee popliteal artery with three-vessel runoff  IMPRESSION: Mary Campbell has bilateral SFA/popliteal disease.  She symptomatic bilaterally.  We will proceed with left SFA directional atherectomy with drug-coated balloon angioplasty using distal protection  TTE 09/19/2018  1. There is moderate to severe thickening of the the right ventricular wall. There is a prominent anterior pericardial fat pat so this observation of the RV possibly represents prominent epicardial fat. There is no reduced RV function or dilation to  suggest a diagnosis of ARVD. Would recommend a cardiac MR for further characterization.  2. The left ventricle has hyperdynamic systolic function, with an ejection fraction of >65%. The cavity size was normal. Left ventricular diastolic Doppler parameters are consistent with impaired relaxation. No evidence of left ventricular regional wall  motion abnormalities.  3. The right ventricle has normal systolic function. The cavity was normal. There is moderately increased right ventricular wall thickness.  4. Trivial pericardial effusion is present.  5. The mitral valve is grossly normal.  6. The tricuspid valve is grossly normal.  7. The aortic valve is tricuspid. Mild thickening of the aortic valve. No stenosis of the aortic valve.  8. The aorta is normal unless otherwise noted.  9. The aortic root and ascending aorta are normal in size and structure. 10. Prominent crista terminalis present. 11. When compared to the prior study: No prior.  NM Stress 09/19/2018  Nuclear stress EF: 70%.  There was no ST segment deviation noted during stress.  The study is normal.  This is a low risk study.  The left ventricular ejection fraction is hyperdynamic (>65%).  No ischemia  EKG:  EKG is *** ordered today.  The ekg ordered today demonstrates ***, and was  personally reviewed by me.   Recent Labs: 10/15/2018: TSH 2.450 10/25/2018: Hemoglobin 12.4; Platelets 357 10/29/2018: BUN 13; Creatinine, Ser 1.07; Potassium 4.5; Sodium 135   Recent Lipid Panel    Component Value Date/Time   CHOL 230 (H) 09/14/2018 0820   TRIG 382 (H) 09/14/2018 0820   HDL 28 (L) 09/14/2018 0820   CHOLHDL 8.2 (H) 09/14/2018 0820   LDLCALC 126 (H) 09/14/2018 0820    Physical Exam:   VS:  There were no vitals taken for this visit.   Wt Readings from Last 3 Encounters:  10/19/18 181 lb 10.5 oz (82.4 kg)  10/04/18 185 lb (83.9 kg)  09/19/18 182 lb (82.6 kg)    General: Well nourished, well developed, in no acute distress Heart: Atraumatic, normal size  Eyes: PEERLA, EOMI  Neck: Supple, no JVD Endocrine: No thryomegaly Cardiac: Normal S1, S2; RRR; no murmurs, rubs, or gallops Lungs: Clear to auscultation bilaterally, no wheezing, rhonchi or rales  Abd: Soft, nontender, no hepatomegaly  Ext: No edema, pulses 2+ Musculoskeletal: No deformities, BUE and BLE strength normal and equal Skin: Warm and dry, no rashes   Neuro: Alert and oriented to person, place, time, and situation, CNII-XII grossly intact, no focal deficits  Psych: Normal mood and affect   ASSESSMENT:   Pamalee Marcoe is a 53 y.o. female who presents for the following: No diagnosis found.  PLAN:   There are no diagnoses linked to this encounter.  Disposition: No follow-ups on file.  Medication Adjustments/Labs and Tests Ordered: Current medicines are reviewed at length with the patient today.  Concerns regarding medicines are outlined above.  No orders of the defined types were placed in this encounter.  No orders of the defined types were placed in this encounter.   There are no Patient Instructions on file for this visit.   Signed, Addison Naegeli. Audie Box, [place]  [address] [place], [place] [address] [phone]  11/07/2018 7:41 PM

## 2018-11-08 ENCOUNTER — Ambulatory Visit: Payer: BC Managed Care – PPO | Admitting: Cardiovascular Disease

## 2018-11-12 ENCOUNTER — Encounter: Payer: Self-pay | Admitting: Cardiovascular Disease

## 2018-11-12 ENCOUNTER — Other Ambulatory Visit: Payer: Self-pay

## 2018-11-12 ENCOUNTER — Ambulatory Visit (INDEPENDENT_AMBULATORY_CARE_PROVIDER_SITE_OTHER): Payer: BC Managed Care – PPO | Admitting: Cardiovascular Disease

## 2018-11-12 VITALS — BP 170/80 | HR 77 | Temp 96.9°F | Ht 65.0 in | Wt 184.6 lb

## 2018-11-12 DIAGNOSIS — I10 Essential (primary) hypertension: Secondary | ICD-10-CM

## 2018-11-12 DIAGNOSIS — I739 Peripheral vascular disease, unspecified: Secondary | ICD-10-CM | POA: Diagnosis not present

## 2018-11-12 DIAGNOSIS — Z720 Tobacco use: Secondary | ICD-10-CM | POA: Diagnosis not present

## 2018-11-12 DIAGNOSIS — E782 Mixed hyperlipidemia: Secondary | ICD-10-CM

## 2018-11-12 DIAGNOSIS — I319 Disease of pericardium, unspecified: Secondary | ICD-10-CM

## 2018-11-12 NOTE — Progress Notes (Signed)
Cardiology Office Note:   Date:  11/12/2018  NAME:  Mary Campbell    MRN: [id number] DOB:  [birth year]   PCP:  Jilda Panda, MD  Cardiologist:  Evalina Field, MD   Referring MD: Jilda Panda, MD   Chief Complaint  Patient presents with   PAD   History of Present Illness:   Mary Campbell is a 53 y.o. female with a hx of DM, HTN, PAD (s/p balloon angioplasty to the left SFA), tobacco abuse who is being seen today for follow-up of PAD.  She was evaluated in late August for chest pain.  Nuclear medicine stress testing was negative for inducible ischemia.  She did have claudication diminished pulses and underwent ABIs which demonstrated severe bilateral PAD.  She underwent balloon angioplasty to the left superficial femoral artery with Dr. Gwenlyn Found on 10/2.  Her cholesterol levels were severely elevated she was started on Crestor.  Her blood pressure is elevated today and she has not been on her ARB-HCTZ combination.  She will resume this today.  Most recent LDL cholesterol is in the 124 range.  She has already eaten today.  She reports that her claudication symptoms in her left lower extremity have improved since her peripheral angioplasty.  She is working on her diabetes.  She is still smoking 1 to 2 cigarettes daily, and working on getting this down to 0.  Her quit date is next week.  She has recently added a diabetes medication.  Most recent A1c 7.8, not that bad.  She reports she has had no further episodes of chest pain or trouble breathing.  She is starting to exercise more, and really going to get serious about losing weight.  Past Medical History: Past Medical History:  Diagnosis Date   Chronic back pain    Diabetes mellitus    Hyperlipidemia    Hypertension    PAD (peripheral artery disease) ([place])    a. PV angio 10/2018 - bilateral SFA disease s/p L intervention.   Tobacco abuse     Past Surgical History: Past Surgical History:  Procedure Laterality Date   ABDOMINAL  AORTOGRAM W/LOWER EXTREMITY Right 10/18/2018   Procedure: ABDOMINAL AORTOGRAM W/LOWER EXTREMITY;  Surgeon: Lorretta Harp, MD;  Location: [facility];  Service: Cardiovascular;  Laterality: Right;   ABDOMINAL HYSTERECTOMY     CHOLECYSTECTOMY     PERIPHERAL VASCULAR ATHERECTOMY Left 10/18/2018   Procedure: PERIPHERAL VASCULAR ATHERECTOMY;  Surgeon: Lorretta Harp, MD;  Location: [facility];  Service: Cardiovascular;  Laterality: Left;  SFA    Current Medications: Current Meds  Medication Sig   acetaminophen (TYLENOL) 500 MG tablet Take 1,000 mg by mouth every 6 (six) hours as needed for moderate pain or headache.   aspirin EC 81 MG tablet Take 81 mg by mouth daily.   Azilsartan-Chlorthalidone (EDARBYCLOR) 40-25 MG TABS Take 1 tablet by mouth daily.   clopidogrel (PLAVIX) 75 MG tablet Take 1 tablet (75 mg total) by mouth daily.   glimepiride (AMARYL) 4 MG tablet Take 4 mg by mouth daily with breakfast.   Insulin Glargine, 1 Unit Dial, (TOUJEO SOLOSTAR) 300 UNIT/ML SOPN Inject 48 Units into the skin at bedtime.   metFORMIN (GLUCOPHAGE) 1000 MG tablet Take 1,000 mg by mouth 2 (two) times daily.   rosuvastatin (CRESTOR) 40 MG tablet Take 1 tablet (40 mg total) by mouth daily. (Patient taking differently: Take 40 mg by mouth at bedtime. )   varenicline (CHANTIX STARTING MONTH PAK) 0.5 MG X  11 & 1 MG X 42 tablet Take 0.5-1 mg by mouth See admin instructions. Take one 0.5 mg tablet by mouth once daily for 3 days, then increase to one 0.5 mg tablet twice daily for 4 days, then increase to one 1 mg tablet twice daily.     Allergies:    Codeine, Lisinopril, and Sulfonamide derivatives   Social History: Social History   Socioeconomic History   Marital status: Legally Separated    Spouse name: Not on file   Number of children: Not on file   Years of education: Not on file   Highest education level: Not on file  Occupational History   Not on file  Social  Needs   Financial resource strain: Not on file   Food insecurity    Worry: Not on file    Inability: Not on file   Transportation needs    Medical: Not on file    Non-medical: Not on file  Tobacco Use   Smoking status: Current Every Day Smoker    Packs/day: 1.00    Types: Cigarettes   Smokeless tobacco: Never Used  Substance and Sexual Activity   Alcohol use: No   Drug use: No   Sexual activity: Yes    Birth control/protection: Surgical  Lifestyle   Physical activity    Days per week: Not on file    Minutes per session: Not on file   Stress: Not on file  Relationships   Social connections    Talks on phone: Not on file    Gets together: Not on file    Attends religious service: Not on file    Active member of club or organization: Not on file    Attends meetings of clubs or organizations: Not on file    Relationship status: Not on file  Other Topics Concern   Not on file  Social History Narrative   Not on file     Family History: The patient's family history includes Diabetes in her mother; Heart attack in her father; Heart disease in her father; Hyperlipidemia in her father and mother; Hypertension in her father and mother.  ROS:   All other ROS reviewed and negative. Pertinent positives noted in the HPI.     EKGs/Labs/Other Studies Reviewed:   The following studies were personally reviewed by me today:   PV Angiogram/Intervention 10/19/2018 Procedures Performed: 1. Ultrasound-guided right common femoral access 2. Abdominal aortogram/bilateral iliac angiogram/bifemoral runoff 3. Contralateral access (second order catheter placement) 4. Placement of a spider distal protection device distal left SFA 5. Hawk 1 directional atherectomy followed by drug-coated balloon angioplasty proximal left SFA  TTE 09/19/2018  1. There is moderate to severe thickening of the the right ventricular  wall. There is a prominent anterior pericardial fat pat so this observation of the RV possibly represents prominent epicardial fat. There is no reduced RV function or dilation to  suggest a diagnosis of ARVD. Would recommend a cardiac MR for further characterization.  2. The left ventricle has hyperdynamic systolic function, with an ejection fraction of >65%. The cavity size was normal. Left ventricular diastolic Doppler parameters are consistent with impaired relaxation. No evidence of left ventricular regional wall  motion abnormalities.  3. The right ventricle has normal systolic function. The cavity was normal. There is moderately increased right ventricular wall thickness.  4. Trivial pericardial effusion is present.  5. The mitral valve is grossly normal.  6. The tricuspid valve is grossly normal.  7. The aortic valve  is tricuspid. Mild thickening of the aortic valve. No stenosis of the aortic valve.  8. The aorta is normal unless otherwise noted.  9. The aortic root and ascending aorta are normal in size and structure. 10. Prominent crista terminalis present. 11. When compared to the prior study: No prior.  NM MPI 09/19/2018  Nuclear stress EF: 70%.  There was no ST segment deviation noted during stress.  The study is normal.  This is a low risk study.  The left ventricular ejection fraction is hyperdynamic (>65%).  No ischemia.  Recent Labs: 10/15/2018: TSH 2.450 10/25/2018: Hemoglobin 12.4; Platelets 357 10/29/2018: BUN 13; Creatinine, Ser 1.07; Potassium 4.5; Sodium 135   A1c 7.8  Recent Lipid Panel    Component Value Date/Time   CHOL 230 (H) 09/14/2018 0820   TRIG 382 (H) 09/14/2018 0820   HDL 28 (L) 09/14/2018 0820   CHOLHDL 8.2 (H) 09/14/2018 0820   LDLCALC 126 (H) 09/14/2018 0820    Physical Exam:   VS:  BP (!) 170/80    Pulse 77    Temp (!) 96.9 F (36.1 C)    Ht  (1.651 m)    Wt 184 lb 9.6 oz (83.7 kg)    SpO2 99%    BMI 30.72 kg/m    Wt Readings from  Last 3 Encounters:  11/12/18 184 lb 9.6 oz (83.7 kg)  10/19/18 181 lb 10.5 oz (82.4 kg)  10/04/18 185 lb (83.9 kg)    General: Well nourished, well developed, in no acute distress Heart: Atraumatic, normal size  Eyes: PEERLA, EOMI  Neck: Supple, no JVD Endocrine: No thryomegaly Cardiac: Normal S1, S2; RRR; no murmurs, rubs, or gallops Lungs: Clear to auscultation bilaterally, no wheezing, rhonchi or rales  Abd: Soft, nontender, no hepatomegaly  Ext: No edema, 1+ pulses in the left lower extremity, very diminished faint pulses in the right lower extremity Musculoskeletal: No deformities, BUE and BLE strength normal and equal Skin: Warm and dry, no rashes   Neuro: Alert and oriented to person, place, time, and situation, CNII-XII grossly intact, no focal deficits  Psych: Normal mood and affect   ASSESSMENT:   Mary Campbell is a 53 y.o. female who presents for the following: 1. PAD (peripheral artery disease) (HCC)   2. Essential hypertension   3. Mixed hyperlipidemia   4. Tobacco abuse   5. Pericardial disorder     PLAN:   1. PAD (peripheral artery disease) (HCC) -Very extensive peripheral artery disease.  Status post balloon angioplasty to the left superficial femoral artery earlier this month.  We will continue her aspirin Plavix.  She will meet with Dr. Gery Pray tomorrow to discuss timing of intervention of the right lower extremity. -She is working on smoking cessation we will continue with this -We will recheck a lipid profile to get her LDL cholesterol less than 70, she will do this tomorrow when she is fasting  2. Essential hypertension -Elevated today, and we will restart her ARB-chlorthalidone combination  3. Mixed hyperlipidemia -Most recent LDL in the 120 range -Continue Crestor 40 mg nightly -We will check her lipid profile tomorrow  4. Tobacco abuse -Still smoking 1 to 2 cigarettes daily, she will quit next week -Smoking cessation counseling provided today  5.  Pericardial disorder -Was noted to have prominent pericardial fat as well as likely RV epicardial fat on recent echocardiogram.  She has no symptoms to suggest malignancy, and this does not represent ARVC.  She is okay to hold on cardiac  MRI at this time.   Disposition: Return in about 3 months (around 02/12/2019).  Medication Adjustments/Labs and Tests Ordered: Current medicines are reviewed at length with the patient today.  Concerns regarding medicines are outlined above.  Orders Placed This Encounter  Procedures   Lipid panel   No orders of the defined types were placed in this encounter.   Patient Instructions  Medication Instructions:    restart blood pressure  *If you need a refill on your cardiac medications before your next appointment, please call your pharmacy*  Lab Work: Lipids - tomorrow- fasting If you have labs (blood work) drawn today and your tests are completely normal, you will receive your results only by:  MyChart Message (if you have MyChart) OR  A paper copy in the mail If you have any lab test that is abnormal or we need to change your treatment, we will call you to review the results.  Testing/Procedures:  Not needed Follow-Up: At [facility], you and your health needs are our priority.  As part of our continuing mission to provide you with exceptional heart care, we have created designated Provider Care Teams.  These Care Teams include your primary Cardiologist (physician) and Advanced Practice Providers (APPs -  Physician Assistants and Nurse Practitioners) who all work together to provide you with the care you need, when you need it.  Your next appointment:   3 months  The format for your next appointment:   In Person  Provider:   Lennie Odor, MD  Other Instructions     Signed, Lenna Gilford. Flora Lipps, MD [facility]  [address] [place], [place] [address] [phone]  11/12/2018 9:26 AM

## 2018-11-12 NOTE — Patient Instructions (Signed)
Medication Instructions:    restart blood pressure  *If you need a refill on your cardiac medications before your next appointment, please call your pharmacy*  Lab Work: Lipids - tomorrow- fasting If you have labs (blood work) drawn today and your tests are completely normal, you will receive your results only by: Marland Kitchen MyChart Message (if you have MyChart) OR . A paper copy in the mail If you have any lab test that is abnormal or we need to change your treatment, we will call you to review the results.  Testing/Procedures:  Not needed Follow-Up: At [facility], you and your health needs are our priority.  As part of our continuing mission to provide you with exceptional heart care, we have created designated Provider Care Teams.  These Care Teams include your primary Cardiologist (physician) and Advanced Practice Providers (APPs -  Physician Assistants and Nurse Practitioners) who all work together to provide you with the care you need, when you need it.  Your next appointment:   3 months  The format for your next appointment:   In Person  Provider:   Eleonore Chiquito, MD  Other Instructions

## 2018-11-13 ENCOUNTER — Encounter: Payer: Self-pay | Admitting: Cardiovascular Disease

## 2018-11-13 ENCOUNTER — Ambulatory Visit (INDEPENDENT_AMBULATORY_CARE_PROVIDER_SITE_OTHER): Payer: BC Managed Care – PPO | Admitting: Cardiovascular Disease

## 2018-11-13 DIAGNOSIS — I739 Peripheral vascular disease, unspecified: Secondary | ICD-10-CM | POA: Diagnosis not present

## 2018-11-13 LAB — LIPID PANEL
Chol/HDL Ratio: 3.9 ratio (ref 0.0–4.4)
Cholesterol, Total: 112 mg/dL (ref 100–199)
HDL: 29 mg/dL — ABNORMAL LOW (ref >39)
LDL Chol Calc (NIH): 56 mg/dL (ref 0–99)
Triglycerides: 154 mg/dL — ABNORMAL HIGH (ref 0–149)
VLDL Cholesterol Cal: 27 mg/dL (ref 5–40)

## 2018-11-13 NOTE — Progress Notes (Signed)
11/13/2018 Mary Campbell   [birth year]  [id number]  Primary Physician Mary Panda, MD Primary Cardiologist: Mary Harp MD Mary Campbell, [place]  HPI:  Mary Campbell is a 53 y.o.  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  She was referred by Mary Campbell, her primary cardiologist, for peripheral vascular valuation because of lifestyle limiting claudication.  I last saw her in the office 10/04/2018. Her cardiac risk factors are notable for 40-50-pack-year tobacco abuse currently trying to stop on Chantix, treated hypertension, diabetes and hyperlipidemia as well as family history with a father who had multiple stents myocardial infarction's.  She is never had a heart attack or stroke.  She currently denies chest pain or shortness of breath although recently she did have chest pain and was evaluated by Mary Campbell.  She had a negative Myoview and a normal 2D echo.  She is complained of claudication over the last year which is fairly symmetric.  This is not bothering her during her workday but when walking longer distances, out shopping, she does have to stop because of cramping in her calves.  Doppler studies performed 09/26/2018 revealed ABIs of about 0.7 bilaterally with an occluded above-the-knee popliteal on the right and high-frequency signal in the proximal left SFA.  I performed peripheral angiography on her 10/18/2018 revealing short segment CTO mid to distal right SFA with three-vessel runoff and 95% / 60% segmental proximal left SFA stenosis with three-vessel runoff.  Performed directional atherectomy followed by drug-coated balloon angioplasty of the proximal left SFA with an excellent result.  Her claudication resolved and her Dopplers improved with a left ABI 0.84.  She wishes to proceed with right SFA intervention.   Current Meds  Medication Sig  . acetaminophen (TYLENOL) 500 MG tablet Take 1,000 mg by mouth every 6 (six) hours as needed  for moderate pain or headache.  Marland Kitchen aspirin EC 81 MG tablet Take 81 mg by mouth daily.  . Azilsartan-Chlorthalidone (EDARBYCLOR) 40-25 MG TABS Take 1 tablet by mouth daily.  . clopidogrel (PLAVIX) 75 MG tablet Take 1 tablet (75 mg total) by mouth daily.  . diphenhydrAMINE (BENADRYL) 25 MG tablet Take 25 mg by mouth daily as needed for itching or allergies.  Marland Kitchen glimepiride (AMARYL) 4 MG tablet Take 4 mg by mouth daily with breakfast.  . Insulin Glargine, 1 Unit Dial, (TOUJEO SOLOSTAR) 300 UNIT/ML SOPN Inject 48 Units into the skin at bedtime.  . metFORMIN (GLUCOPHAGE) 1000 MG tablet Take 1,000 mg by mouth 2 (two) times daily.  . rosuvastatin (CRESTOR) 40 MG tablet Take 1 tablet (40 mg total) by mouth daily. (Patient taking differently: Take 40 mg by mouth at bedtime. )  . varenicline (CHANTIX STARTING MONTH PAK) 0.5 MG X 11 & 1 MG X 42 tablet Take 0.5-1 mg by mouth See admin instructions. Take one 0.5 mg tablet by mouth once daily for 3 days, then increase to one 0.5 mg tablet twice daily for 4 days, then increase to one 1 mg tablet twice daily.     Allergies  Allergen Reactions  . Codeine Hives  . Lisinopril Cough  . Sulfonamide Derivatives Hives    Social History   Socioeconomic History  . Marital status: Legally Separated    Spouse name: Not on file  . Number of children: Not on file  . Years of education: Not on file  . Highest education level: Not on file  Occupational History  . Not on  file  Social Needs  . Financial resource strain: Not on file  . Food insecurity    Worry: Not on file    Inability: Not on file  . Transportation needs    Medical: Not on file    Non-medical: Not on file  Tobacco Use  . Smoking status: Current Every Day Smoker    Packs/day: 1.00    Types: Cigarettes  . Smokeless tobacco: Never Used  Substance and Sexual Activity  . Alcohol use: No  . Drug use: No  . Sexual activity: Yes    Birth control/protection: Surgical  Lifestyle  . Physical  activity    Days per week: Not on file    Minutes per session: Not on file  . Stress: Not on file  Relationships  . Social Musician on phone: Not on file    Gets together: Not on file    Attends religious service: Not on file    Active member of club or organization: Not on file    Attends meetings of clubs or organizations: Not on file    Relationship status: Not on file  . Intimate partner violence    Fear of current or ex partner: Not on file    Emotionally abused: Not on file    Physically abused: Not on file    Forced sexual activity: Not on file  Other Topics Concern  . Not on file  Social History Narrative  . Not on file     Review of Systems: General: negative for chills, fever, night sweats or weight changes.  Cardiovascular: negative for chest pain, dyspnea on exertion, edema, orthopnea, palpitations, paroxysmal nocturnal dyspnea or shortness of breath Dermatological: negative for rash Respiratory: negative for cough or wheezing Urologic: negative for hematuria Abdominal: negative for nausea, vomiting, diarrhea, bright red blood per rectum, melena, or hematemesis Neurologic: negative for visual changes, syncope, or dizziness All other systems reviewed and are otherwise negative except as noted above.    Blood pressure (!) 160/72, pulse 86, height 5\' 5"  (1.651 m), weight 184 lb (83.5 kg), SpO2 98 %.  General appearance: alert and no distress Neck: no adenopathy, no carotid bruit, no JVD, supple, symmetrical, trachea midline and thyroid not enlarged, symmetric, no tenderness/mass/nodules Lungs: clear to auscultation bilaterally Heart: regular rate and rhythm, S1, S2 normal, no murmur, click, rub or gallop Extremities: extremities normal, atraumatic, no cyanosis or edema Pulses: 2+ and symmetric Skin: Skin color, texture, turgor normal. No rashes or lesions Neurologic: Alert and oriented X 3, normal strength and tone. Normal symmetric reflexes. Normal  coordination and gait  EKG not performed today  ASSESSMENT AND PLAN:   Claudication in peripheral vascular disease (HCC) Mary Campbell returns for follow-up of her recent PV procedure performed 10/18/2018 for symptomatic complex limiting claudication.  She had subtotally occluded left SFA and underwent directional atherectomy and drug-coated balloon angioplasty with an excellent angiographic and clinical result.  Her follow-up Doppler studies performed 10/23/2018 revealed a left ABI of 1.84 with widely patent vessel and normal waveforms.  Her claudication Campbell resolved.  She does have a short segment CTO right SFA which she wishes to have addressed.  I will schedule this for next week.      12/23/2018 MD FACP,FACC,FAHA, [facility] 11/13/2018 2:27 PM

## 2018-11-13 NOTE — H&P (View-Only) (Signed)
11/13/2018 Mary Campbell   [birth year]  [id number]  Primary Physician Jilda Panda, MD Primary Cardiologist: Lorretta Harp MD [name], [place]  HPI:  Mary Campbell is a 53 y.o.  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  She was referred by Dr. Davina Poke, her primary cardiologist, for peripheral vascular valuation because of lifestyle limiting claudication.  I last saw her in the office 10/04/2018. Her cardiac risk factors are notable for 40-50-pack-year tobacco abuse currently trying to stop on Chantix, treated hypertension, diabetes and hyperlipidemia as well as family history with a father who had multiple stents myocardial infarction's.  She is never had a heart attack or stroke.  She currently denies chest pain or shortness of breath although recently she did have chest pain and was evaluated by Dr. Farris Has.  She had a negative Myoview and a normal 2D echo.  She is complained of claudication over the last year which is fairly symmetric.  This is not bothering her during her workday but when walking longer distances, out shopping, she does have to stop because of cramping in her calves.  Doppler studies performed 09/26/2018 revealed ABIs of about 0.7 bilaterally with an occluded above-the-knee popliteal on the right and high-frequency signal in the proximal left SFA.  I performed peripheral angiography on her 10/18/2018 revealing short segment CTO mid to distal right SFA with three-vessel runoff and 95% / 60% segmental proximal left SFA stenosis with three-vessel runoff.  Performed directional atherectomy followed by drug-coated balloon angioplasty of the proximal left SFA with an excellent result.  Her claudication resolved and her Dopplers improved with a left ABI 0.84.  She wishes to proceed with right SFA intervention.   Current Meds  Medication Sig  . acetaminophen (TYLENOL) 500 MG tablet Take 1,000 mg by mouth every 6 (six) hours as needed  for moderate pain or headache.  Marland Kitchen aspirin EC 81 MG tablet Take 81 mg by mouth daily.  . Azilsartan-Chlorthalidone (EDARBYCLOR) 40-25 MG TABS Take 1 tablet by mouth daily.  . clopidogrel (PLAVIX) 75 MG tablet Take 1 tablet (75 mg total) by mouth daily.  . diphenhydrAMINE (BENADRYL) 25 MG tablet Take 25 mg by mouth daily as needed for itching or allergies.  Marland Kitchen glimepiride (AMARYL) 4 MG tablet Take 4 mg by mouth daily with breakfast.  . Insulin Glargine, 1 Unit Dial, (TOUJEO SOLOSTAR) 300 UNIT/ML SOPN Inject 48 Units into the skin at bedtime.  . metFORMIN (GLUCOPHAGE) 1000 MG tablet Take 1,000 mg by mouth 2 (two) times daily.  . rosuvastatin (CRESTOR) 40 MG tablet Take 1 tablet (40 mg total) by mouth daily. (Patient taking differently: Take 40 mg by mouth at bedtime. )  . varenicline (CHANTIX STARTING MONTH PAK) 0.5 MG X 11 & 1 MG X 42 tablet Take 0.5-1 mg by mouth See admin instructions. Take one 0.5 mg tablet by mouth once daily for 3 days, then increase to one 0.5 mg tablet twice daily for 4 days, then increase to one 1 mg tablet twice daily.     Allergies  Allergen Reactions  . Codeine Hives  . Lisinopril Cough  . Sulfonamide Derivatives Hives    Social History   Socioeconomic History  . Marital status: Legally Separated    Spouse name: Not on file  . Number of children: Not on file  . Years of education: Not on file  . Highest education level: Not on file  Occupational History  . Not on  file  Social Needs  . Financial resource strain: Not on file  . Food insecurity    Worry: Not on file    Inability: Not on file  . Transportation needs    Medical: Not on file    Non-medical: Not on file  Tobacco Use  . Smoking status: Current Every Day Smoker    Packs/day: 1.00    Types: Cigarettes  . Smokeless tobacco: Never Used  Substance and Sexual Activity  . Alcohol use: No  . Drug use: No  . Sexual activity: Yes    Birth control/protection: Surgical  Lifestyle  . Physical  activity    Days per week: Not on file    Minutes per session: Not on file  . Stress: Not on file  Relationships  . Social connections    Talks on phone: Not on file    Gets together: Not on file    Attends religious service: Not on file    Active member of club or organization: Not on file    Attends meetings of clubs or organizations: Not on file    Relationship status: Not on file  . Intimate partner violence    Fear of current or ex partner: Not on file    Emotionally abused: Not on file    Physically abused: Not on file    Forced sexual activity: Not on file  Other Topics Concern  . Not on file  Social History Narrative  . Not on file     Review of Systems: General: negative for chills, fever, night sweats or weight changes.  Cardiovascular: negative for chest pain, dyspnea on exertion, edema, orthopnea, palpitations, paroxysmal nocturnal dyspnea or shortness of breath Dermatological: negative for rash Respiratory: negative for cough or wheezing Urologic: negative for hematuria Abdominal: negative for nausea, vomiting, diarrhea, bright red blood per rectum, melena, or hematemesis Neurologic: negative for visual changes, syncope, or dizziness All other systems reviewed and are otherwise negative except as noted above.    Blood pressure (!) 160/72, pulse 86, height 5' 5" (1.651 m), weight 184 lb (83.5 kg), SpO2 98 %.  General appearance: alert and no distress Neck: no adenopathy, no carotid bruit, no JVD, supple, symmetrical, trachea midline and thyroid not enlarged, symmetric, no tenderness/mass/nodules Lungs: clear to auscultation bilaterally Heart: regular rate and rhythm, S1, S2 normal, no murmur, click, rub or gallop Extremities: extremities normal, atraumatic, no cyanosis or edema Pulses: 2+ and symmetric Skin: Skin color, texture, turgor normal. No rashes or lesions Neurologic: Alert and oriented X 3, normal strength and tone. Normal symmetric reflexes. Normal  coordination and gait  EKG not performed today  ASSESSMENT AND PLAN:   Claudication in peripheral vascular disease (HCC) Ms. Nottingham returns for follow-up of her recent PV procedure performed 10/18/2018 for symptomatic complex limiting claudication.  She had subtotally occluded left SFA and underwent directional atherectomy and drug-coated balloon angioplasty with an excellent angiographic and clinical result.  Her follow-up Doppler studies performed 10/23/2018 revealed a left ABI of 1.84 with widely patent vessel and normal waveforms.  Her claudication has resolved.  She does have a short segment CTO right SFA which she wishes to have addressed.  I will schedule this for next week.      Jazir Newey J. Deshondra Worst MD FACP,FACC,FAHA, FSCAI 11/13/2018 2:27 PM 

## 2018-11-13 NOTE — Addendum Note (Signed)
Addended by: Annita Brod on: 11/13/2018 03:10 PM   Modules accepted: Orders

## 2018-11-13 NOTE — Assessment & Plan Note (Signed)
Mary Campbell returns for follow-up of her recent PV procedure performed 10/18/2018 for symptomatic complex limiting claudication.  She had subtotally occluded left SFA and underwent directional atherectomy and drug-coated balloon angioplasty with an excellent angiographic and clinical result.  Her follow-up Doppler studies performed 10/23/2018 revealed a left ABI of 1.84 with widely patent vessel and normal waveforms.  Her claudication has resolved.  She does have a short segment CTO right SFA which she wishes to have addressed.  I will schedule this for next week.

## 2018-11-13 NOTE — Patient Instructions (Signed)
[place] [address] Dept: [phone] Loc: [phone]  Emaley Applin  11/13/2018  You are scheduled for a Peripheral Angiogram on Thursday, November 5 with Dr. Quay Burow.  1. Please arrive at the [facility] (Main Entrance A) at [facility]: [address] [place], [place] [address] at 7:30 AM (This time is two hours before your procedure to ensure your preparation). Free valet parking service is available.   Special note: Every effort is made to have your procedure done on time. Please understand that emergencies sometimes delay scheduled procedures.  2. Diet: Do not eat solid foods after midnight.  The patient may have clear liquids until 5am upon the day of the procedure.  3. Labs:  You will need to have blood drawn TODAY. Go to:  [facility] at [facility] [id number], [place], [place] [address] FOR YOUR BASIC METABOLIC PANEL, COMPLETE BLOOD COUNT, AND THYROID STIMULATING HORMONE LAB WORK. NO APPOINTMENT IS NEEDED. YOU MUST HAVE THIS LAB WORK DONE BEFORE GOING TO GET YOUR COVID-19 TEST DONE BECAUSE YOU WILL NEED TO SELF-ISOLATE AFTER THE COVID-19 TEST UNTIL THE DAY OF YOUR [place].  You will need a COVID-19 test on 11/19/2018. Your appointment is at 2:00 pm. Go to: [facility] Entrance [place],  [id number] FOR YOUR COVID-19 TEST. YOU MUST HAVE YOUR COVID-19 TEST COMPLETED 4 DAYS PRIOR TO YOUR UPCOMING PROCEDURE/TEST. YOU WILL ALSO NEED TO SELF-ISOLATE AFTER THE COVID-19 TEST UNTIL THE DAY OF YOUR PROCEDURE/TEST. PLEASE BRING YOUR I.D. AND YOUR INSURANCE CARD(S) WITH YOU.   4. Medication instructions in preparation for your procedure:  HOLD GLIMEPIRIDE, METFORMIN, AND EDABYCLOR ON THE MORNING OF THE PROCEDURE  Take only 24 units (HALF YOUR DOSE) of insulin GLARGINE (TOUJEO) the night  before your procedure. Do not take any insulin on the day of the procedure.  On the morning of your procedure, take your Plavix/Clopidogrel and any morning medicines NOT listed above.  You may use sips of water.  5. Plan for one night stay--bring personal belongings. 6. Bring a current list of your medications and current insurance cards. 7. You MUST have a responsible person to drive you home. 8. Someone MUST be with you the first 24 hours after you arrive home or your discharge will be delayed. 9. Please wear clothes that are easy to get on and off and wear slip-on shoes.  __________________________________________________________________ Testing/Procedures: Your physician has requested that you have an aorta/iliac duplex. During this test, an ultrasound is used to evaluate blood flow to the aorta and iliac arteries. Allow one hour for this exam. Do not eat after midnight the day before and avoid carbonated beverages. DUE AFTER YOUR PROCEDURE. TO BE SCHEDULED  Your physician has requested that you have a lower or upper extremity arterial duplex. This test is an ultrasound of the arteries in the legs or arms. It looks at arterial blood flow in the legs and arms. Allow one hour for Lower and Upper Arterial scans. There are no restrictions or special instructions DUE AFTER YOUR PROCEDURE. TO BE SCHEDULED  Follow-Up: At [facility], you and your health needs are our priority.  As part of our continuing mission to provide you with exceptional heart care, we have created designated Provider Care Teams.  These Care Teams include your primary Cardiologist (physician) and Advanced Practice Providers (APPs -  Physician Assistants and Nurse Practitioners)  who all work together to provide you with the care you need, when you need it. You will need a follow up appointment with Dr. [name] AFTER YOUR ULTRASOUNDS.  TO BE SCHEDULED

## 2018-11-14 LAB — CBC
Hematocrit: 33.8 % — ABNORMAL LOW (ref 34.0–46.6)
Hemoglobin: 11.6 g/dL (ref 11.1–15.9)
MCH: 29.5 pg (ref 26.6–33.0)
MCHC: 34.3 g/dL (ref 31.5–35.7)
MCV: 86 fL (ref 79–97)
Platelets: 264 10*3/uL (ref 150–450)
RBC: 3.93 x10E6/uL (ref 3.77–5.28)
RDW: 14.6 % (ref 11.7–15.4)
WBC: 9 10*3/uL (ref 3.4–10.8)

## 2018-11-14 LAB — BASIC METABOLIC PANEL
BUN/Creatinine Ratio: 8 — ABNORMAL LOW (ref 9–23)
BUN: 8 mg/dL (ref 6–24)
CO2: 26 mmol/L (ref 20–29)
Calcium: 9.4 mg/dL (ref 8.7–10.2)
Chloride: 99 mmol/L (ref 96–106)
Creatinine, Ser: 0.96 mg/dL (ref 0.57–1.00)
GFR calc Af Amer: 77 mL/min/{1.73_m2} (ref >59)
GFR calc non Af Amer: 67 mL/min/{1.73_m2} (ref >59)
Glucose: 196 mg/dL — ABNORMAL HIGH (ref 65–99)
Potassium: 4 mmol/L (ref 3.5–5.2)
Sodium: 138 mmol/L (ref 134–144)

## 2018-11-19 ENCOUNTER — Other Ambulatory Visit (HOSPITAL_COMMUNITY)
Admission: RE | Admit: 2018-11-19 | Discharge: 2018-11-19 | Disposition: A | Discharge status: Home / Self Care | Payer: BC Managed Care – PPO | Source: Ambulatory Visit | Attending: Cardiovascular Disease | Admitting: Cardiovascular Disease

## 2018-11-19 DIAGNOSIS — Z20828 Contact with and (suspected) exposure to other viral communicable diseases: Secondary | ICD-10-CM | POA: Diagnosis not present

## 2018-11-19 DIAGNOSIS — Z01812 Encounter for preprocedural laboratory examination: Secondary | ICD-10-CM | POA: Insufficient documentation

## 2018-11-20 LAB — NOVEL CORONAVIRUS, NAA (HOSP ORDER, SEND-OUT TO REF LAB; TAT 18-24 HRS): SARS-CoV-2, NAA: NOT DETECTED

## 2018-11-21 ENCOUNTER — Other Ambulatory Visit: Payer: Self-pay

## 2018-11-21 ENCOUNTER — Telehealth: Payer: Self-pay | Admitting: *Deleted

## 2018-11-21 DIAGNOSIS — I739 Peripheral vascular disease, unspecified: Secondary | ICD-10-CM

## 2018-11-21 NOTE — Telephone Encounter (Signed)
Pt contacted pre-catheterization scheduled at [facility] for: Thursday November 22, 2018 7:30 AM Verified arrival time and place: [facility]) at: 5:30 AM   No solid food after midnight prior to cath, clear liquids until 5 AM day of procedure. Contrast allergy: no  Hold: Azilsartan-chlorthalidone -AM of procedure Metformin-day of procedure and 48 hours post procedure. 1/2 Insulin-HS prior to procedure Glimepiride-AM of procedure   Except hold medications AM meds can be  taken pre-cath with sip of water including: ASA 81 mg Plavix 75 mg  Confirmed patient has responsible adult to drive home post procedure and observe 24 hours after arriving home: yes  Currently, due to Covid-19 pandemic, only one support person will be allowed with patient. Must be the same support person for that patient's entire stay, will be screened and required to wear a mask. They will be asked to wait in the waiting room for the duration of the patient's stay.  Patients are required to wear a mask when they enter the hospital.      COVID-19 Pre-Screening Questions:  . In the past 7 to 10 days have you had a cough,  shortness of breath, headache, congestion, fever (100 or greater) body aches, chills, sore throat, or sudden loss of taste or sense of smell? no . Have you been around anyone with known Covid 19? no . Have you been around anyone who is awaiting Covid 19 test results in the past 7 to 10 days? no . Have you been around anyone who has been exposed to Covid 19, or has mentioned symptoms of Covid 19 within the past 7 to 10 days? no  I reviewed procedure/mask/visitor, Covid-19 screening questions with patient, she verbalized understanding, thanked me for call. Pt is aware procedure time has been changed from 9:30 AM to 7:30 AM (arrive 5:30 AM).

## 2018-11-22 ENCOUNTER — Encounter (HOSPITAL_COMMUNITY)
Admission: RE | Disposition: A | Discharge status: Home / Self Care | Payer: Self-pay | Source: Home / Self Care | Attending: Cardiovascular Disease

## 2018-11-22 ENCOUNTER — Encounter (HOSPITAL_COMMUNITY): Payer: Self-pay | Admitting: Cardiovascular Disease

## 2018-11-22 ENCOUNTER — Ambulatory Visit (HOSPITAL_COMMUNITY)
Admission: RE | Admit: 2018-11-22 | Discharge: 2018-11-22 | Disposition: A | Discharge status: Home / Self Care | Payer: BC Managed Care – PPO | Attending: Cardiovascular Disease | Admitting: Cardiovascular Disease

## 2018-11-22 ENCOUNTER — Other Ambulatory Visit: Payer: Self-pay

## 2018-11-22 DIAGNOSIS — Z7902 Long term (current) use of antithrombotics/antiplatelets: Secondary | ICD-10-CM | POA: Insufficient documentation

## 2018-11-22 DIAGNOSIS — I70211 Atherosclerosis of native arteries of extremities with intermittent claudication, right leg: Secondary | ICD-10-CM | POA: Diagnosis not present

## 2018-11-22 DIAGNOSIS — Z7982 Long term (current) use of aspirin: Secondary | ICD-10-CM | POA: Insufficient documentation

## 2018-11-22 DIAGNOSIS — E663 Overweight: Secondary | ICD-10-CM | POA: Insufficient documentation

## 2018-11-22 DIAGNOSIS — E785 Hyperlipidemia, unspecified: Secondary | ICD-10-CM | POA: Diagnosis not present

## 2018-11-22 DIAGNOSIS — Z6830 Body mass index (BMI) 30.0-30.9, adult: Secondary | ICD-10-CM | POA: Insufficient documentation

## 2018-11-22 DIAGNOSIS — Z794 Long term (current) use of insulin: Secondary | ICD-10-CM | POA: Diagnosis not present

## 2018-11-22 DIAGNOSIS — I739 Peripheral vascular disease, unspecified: Secondary | ICD-10-CM

## 2018-11-22 DIAGNOSIS — Z79899 Other long term (current) drug therapy: Secondary | ICD-10-CM | POA: Insufficient documentation

## 2018-11-22 DIAGNOSIS — E1151 Type 2 diabetes mellitus with diabetic peripheral angiopathy without gangrene: Secondary | ICD-10-CM | POA: Insufficient documentation

## 2018-11-22 DIAGNOSIS — I10 Essential (primary) hypertension: Secondary | ICD-10-CM | POA: Insufficient documentation

## 2018-11-22 DIAGNOSIS — F17210 Nicotine dependence, cigarettes, uncomplicated: Secondary | ICD-10-CM | POA: Diagnosis not present

## 2018-11-22 HISTORY — PX: PERIPHERAL VASCULAR ATHERECTOMY: CATH118256

## 2018-11-22 HISTORY — PX: PERIPHERAL VASCULAR BALLOON ANGIOPLASTY: CATH118281

## 2018-11-22 LAB — GLUCOSE, CAPILLARY: Glucose-Capillary: 144 mg/dL — ABNORMAL HIGH (ref 70–99)

## 2018-11-22 LAB — POCT ACTIVATED CLOTTING TIME
Activated Clotting Time: 279 seconds
Activated Clotting Time: 312 seconds
Activated Clotting Time: 235 seconds

## 2018-11-22 SURGERY — PERIPHERAL VASCULAR ATHERECTOMY
Anesthesia: LOCAL | Laterality: Right

## 2018-11-22 MED ORDER — ONDANSETRON HCL 4 MG/2ML IJ SOLN: 4.0000 mg | Freq: Four times a day (QID) | INTRAMUSCULAR | Status: DC | PRN

## 2018-11-22 MED ORDER — HEPARIN (PORCINE) IN NACL 1000-0.9 UT/500ML-% IV SOLN
INTRAVENOUS | Status: AC
  Filled 2018-11-22: qty 1000

## 2018-11-22 MED ORDER — SODIUM CHLORIDE 0.9 % WEIGHT BASED INFUSION: 1.0000 mL/kg/h | INTRAVENOUS | Status: DC

## 2018-11-22 MED ORDER — FENTANYL CITRATE (PF) 100 MCG/2ML IJ SOLN
INTRAMUSCULAR | Status: AC
  Filled 2018-11-22: qty 2

## 2018-11-22 MED ORDER — ACETAMINOPHEN 325 MG PO TABS: 650.0000 mg | ORAL_TABLET | ORAL | Status: DC | PRN

## 2018-11-22 MED ORDER — HEPARIN SODIUM (PORCINE) 1000 UNIT/ML IJ SOLN
INTRAMUSCULAR | Status: DC | PRN
  Administered 2018-11-22: 9000 [IU] via INTRAVENOUS
  Administered 2018-11-22: 2000 [IU] via INTRAVENOUS

## 2018-11-22 MED ORDER — SODIUM CHLORIDE 0.9% FLUSH: 3.0000 mL | Freq: Two times a day (BID) | INTRAVENOUS | Status: DC

## 2018-11-22 MED ORDER — HEPARIN SODIUM (PORCINE) 1000 UNIT/ML IJ SOLN
INTRAMUSCULAR | Status: AC
  Filled 2018-11-22: qty 1

## 2018-11-22 MED ORDER — FENTANYL CITRATE (PF) 100 MCG/2ML IJ SOLN
INTRAMUSCULAR | Status: DC | PRN
  Administered 2018-11-22: 25 ug via INTRAVENOUS

## 2018-11-22 MED ORDER — SODIUM CHLORIDE 0.9% FLUSH: 3.0000 mL | INTRAVENOUS | Status: DC | PRN

## 2018-11-22 MED ORDER — MIDAZOLAM HCL 2 MG/2ML IJ SOLN
INTRAMUSCULAR | Status: AC
  Filled 2018-11-22: qty 2

## 2018-11-22 MED ORDER — ASPIRIN 81 MG PO CHEW: 81.0000 mg | CHEWABLE_TABLET | ORAL | Status: DC

## 2018-11-22 MED ORDER — LABETALOL HCL 5 MG/ML IV SOLN
10.0000 mg | INTRAVENOUS | Status: DC | PRN
  Administered 2018-11-22: 10:00:00 10 mg via INTRAVENOUS
  Filled 2018-11-22: qty 4

## 2018-11-22 MED ORDER — SODIUM CHLORIDE 0.9 % IV SOLN: INTRAVENOUS | Status: DC

## 2018-11-22 MED ORDER — LIDOCAINE HCL (PF) 1 % IJ SOLN
INTRAMUSCULAR | Status: AC
  Filled 2018-11-22: qty 30

## 2018-11-22 MED ORDER — SODIUM CHLORIDE 0.9 % WEIGHT BASED INFUSION
3.0000 mL/kg/h | INTRAVENOUS | Status: AC
  Administered 2018-11-22: 3 mL/kg/h via INTRAVENOUS

## 2018-11-22 MED ORDER — MIDAZOLAM HCL 2 MG/2ML IJ SOLN
INTRAMUSCULAR | Status: DC | PRN
  Administered 2018-11-22: 1 mg via INTRAVENOUS

## 2018-11-22 MED ORDER — HYDRALAZINE HCL 20 MG/ML IJ SOLN
INTRAMUSCULAR | Status: DC | PRN
  Administered 2018-11-22 (×2): 10 mg via INTRAVENOUS

## 2018-11-22 MED ORDER — LIDOCAINE HCL (PF) 1 % IJ SOLN
INTRAMUSCULAR | Status: DC | PRN
  Administered 2018-11-22: 25 mL

## 2018-11-22 MED ORDER — SODIUM CHLORIDE 0.9 % IV SOLN: 250.0000 mL | INTRAVENOUS | Status: DC | PRN

## 2018-11-22 MED ORDER — HYDRALAZINE HCL 20 MG/ML IJ SOLN: 5.0000 mg | INTRAMUSCULAR | Status: DC | PRN

## 2018-11-22 MED ORDER — HEPARIN (PORCINE) IN NACL 1000-0.9 UT/500ML-% IV SOLN
INTRAVENOUS | Status: DC | PRN
  Administered 2018-11-22 (×2): 500 mL

## 2018-11-22 MED ORDER — IODIXANOL 320 MG/ML IV SOLN
INTRAVENOUS | Status: DC | PRN
  Administered 2018-11-22: 145 mL

## 2018-11-22 MED ORDER — CLOPIDOGREL BISULFATE 75 MG PO TABS: 75.0000 mg | ORAL_TABLET | Freq: Every day | ORAL | Status: DC

## 2018-11-22 MED ORDER — HYDRALAZINE HCL 20 MG/ML IJ SOLN
INTRAMUSCULAR | Status: AC
  Filled 2018-11-22: qty 1

## 2018-11-22 MED ORDER — ASPIRIN EC 81 MG PO TBEC: 81.0000 mg | DELAYED_RELEASE_TABLET | Freq: Every day | ORAL | Status: DC

## 2018-11-22 SURGICAL SUPPLY — 27 items
BALLN COYOTE OTW 2.5X100X150 (BALLOONS) ×3
BALLN IN.PACT DCB 5X150 (BALLOONS) ×3
BALLOON COYOTE OTW 2.5X100X150 (BALLOONS) IMPLANT
CATH CROSS OVER TEMPO 5F (CATHETERS) ×1 IMPLANT
CATH HAWKONE LS STANDARD TIP (CATHETERS) ×3
CATH HAWKONE LS STD TIP (CATHETERS) IMPLANT
CATH VIANCE CROSS STAND 150CM (MICROCATHETER) ×3
CATH VIANCE CROSS STD 150CM (MICROCATHETER) IMPLANT
CLOSURE MYNX CONTROL 6F/7F (Vascular Products) ×1 IMPLANT
DCB IN.PACT 5X150 (BALLOONS) IMPLANT
DEVICE SPIDERFX EMB PROT 6MM (WIRE) ×1 IMPLANT
DEVICE TORQUE .014-.018 (MISCELLANEOUS) IMPLANT
KIT ENCORE 26 ADVANTAGE (KITS) ×1 IMPLANT
KIT PV (KITS) ×3 IMPLANT
SHEATH HIGHFLEX ANSEL 7FR 55CM (SHEATH) ×1 IMPLANT
SHEATH PINNACLE 5F 10CM (SHEATH) ×1 IMPLANT
SHEATH PINNACLE 7F 10CM (SHEATH) ×1 IMPLANT
STOPCOCK MORSE 400PSI 3WAY (MISCELLANEOUS) ×1 IMPLANT
SYR MEDRAD MARK 7 150ML (SYRINGE) ×3 IMPLANT
TAPE VIPERTRACK RADIOPAQ (MISCELLANEOUS) IMPLANT
TAPE VIPERTRACK RADIOPAQUE (MISCELLANEOUS) ×3
TORQUE DEVICE .014-.018 (MISCELLANEOUS) ×3
TRANSDUCER W/STOPCOCK (MISCELLANEOUS) ×3 IMPLANT
TRAY PV CATH (CUSTOM PROCEDURE TRAY) ×3 IMPLANT
TUBING CIL FLEX 10 FLL-RA (TUBING) ×2 IMPLANT
WIRE HITORQ VERSACORE ST 145CM (WIRE) ×1 IMPLANT
WIRE SPARTACORE .014X300CM (WIRE) ×1 IMPLANT

## 2018-11-22 NOTE — Discharge Instructions (Signed)
HOLD METFORMIN//RESTART 11/25/2018   Angiogram, Care After This sheet gives you information about how to care for yourself after your procedure. Your health care provider may also give you more specific instructions. If you have problems or questions, contact your health care provider. What can I expect after the procedure? After the procedure, it is common to have bruising and tenderness at the catheter insertion area. Follow these instructions at home: Insertion site care  Follow instructions from your health care provider about how to take care of your insertion site. Make sure you: ? Wash your hands with soap and water before you change your bandage (dressing). If soap and water are not available, use hand sanitizer. ? Change your dressing as told by your health care provider. ? Leave stitches (sutures), skin glue, or adhesive strips in place. These skin closures may need to stay in place for 2 weeks or longer. If adhesive strip edges start to loosen and curl up, you may trim the loose edges. Do not remove adhesive strips completely unless your health care provider tells you to do that.  Do not take baths, swim, or use a hot tub until your health care provider approves.  You may shower 24-48 hours after the procedure or as told by your health care provider. ? Gently wash the site with plain soap and water. ? Pat the area dry with a clean towel. ? Do not rub the site. This may cause bleeding.  Do not apply powder or lotion to the site. Keep the site clean and dry.  Check your insertion site every day for signs of infection. Check for: ? Redness, swelling, or pain. ? Fluid or blood. ? Warmth. ? Pus or a bad smell. Activity  Rest as told by your health care provider, usually for 1-2 days.  Do not lift anything that is heavier than 10 lbs. (4.5 kg) or as told by your health care provider.  Do not drive for 24 hours if you were given a medicine to help you relax (sedative).  Do not  drive or use heavy machinery while taking prescription pain medicine. General instructions   Return to your normal activities as told by your health care provider, usually in about a week. Ask your health care provider what activities are safe for you.  If the catheter site starts bleeding, lie flat and put pressure on the site. If the bleeding does not stop, get help right away. This is a medical emergency.  Drink enough fluid to keep your urine clear or pale yellow. This helps flush the contrast dye from your body.  Take over-the-counter and prescription medicines only as told by your health care provider.  Keep all follow-up visits as told by your health care provider. This is important. Contact a health care provider if:  You have a fever or chills.  You have redness, swelling, or pain around your insertion site.  You have fluid or blood coming from your insertion site.  The insertion site feels warm to the touch.  You have pus or a bad smell coming from your insertion site.  You have bruising around the insertion site.  You notice blood collecting in the tissue around the catheter site (hematoma). The hematoma may be painful to the touch. Get help right away if:  You have severe pain at the catheter insertion area.  The catheter insertion area swells very fast.  The catheter insertion area is bleeding, and the bleeding does not stop when you hold  steady pressure on the area.  The area near or just beyond the catheter insertion site becomes pale, cool, tingly, or numb. These symptoms may represent a serious problem that is an emergency. Do not wait to see if the symptoms will go away. Get medical help right away. Call your local emergency services (911 in the [place] Do not drive yourself to the hospital. Summary  After the procedure, it is common to have bruising and tenderness at the catheter insertion area.  After the procedure, it is important to rest and drink plenty  of fluids.  Do not take baths, swim, or use a hot tub until your health care provider says it is okay to do so. You may shower 24-48 hours after the procedure or as told by your health care provider.  If the catheter site starts bleeding, lie flat and put pressure on the site. If the bleeding does not stop, get help right away. This is a medical emergency. This information is not intended to replace advice given to you by your health care provider. Make sure you discuss any questions you have with your health care provider. Document Released: 07/22/2004 Document Revised: 12/16/2016 Document Reviewed: 12/09/2015 Elsevier Patient Education  2020 [employer].

## 2018-11-22 NOTE — Interval H&P Note (Signed)
History and Physical Interval Note:  11/22/2018 7:35 AM  Mary Campbell  has presented today for surgery, with the diagnosis of pad.  The various methods of treatment have been discussed with the patient and family. After consideration of risks, benefits and other options for treatment, the patient has consented to  Procedure(s): ABDOMINAL AORTOGRAM W/LOWER EXTREMITY (N/A) as a surgical intervention.  The patient's history has been reviewed, patient examined, no change in status, stable for surgery.  I have reviewed the patient's chart and labs.  Questions were answered to the patient's satisfaction.     Quay Burow

## 2018-11-27 ENCOUNTER — Encounter: Payer: Self-pay | Admitting: *Deleted

## 2018-11-29 ENCOUNTER — Encounter (HOSPITAL_COMMUNITY): Payer: BC Managed Care – PPO

## 2018-11-29 ENCOUNTER — Other Ambulatory Visit: Payer: Self-pay

## 2018-11-29 ENCOUNTER — Ambulatory Visit (HOSPITAL_COMMUNITY)
Admission: RE | Admit: 2018-11-29 | Discharge: 2018-11-29 | Disposition: A | Discharge status: Home / Self Care | Payer: BC Managed Care – PPO | Source: Ambulatory Visit | Attending: Cardiovascular Disease | Admitting: Cardiovascular Disease

## 2018-11-29 ENCOUNTER — Other Ambulatory Visit: Payer: Self-pay | Admitting: *Deleted

## 2018-11-29 DIAGNOSIS — I739 Peripheral vascular disease, unspecified: Secondary | ICD-10-CM

## 2018-12-11 ENCOUNTER — Encounter: Payer: Self-pay | Admitting: Cardiovascular Disease

## 2018-12-11 ENCOUNTER — Other Ambulatory Visit: Payer: Self-pay

## 2018-12-11 ENCOUNTER — Ambulatory Visit (INDEPENDENT_AMBULATORY_CARE_PROVIDER_SITE_OTHER): Payer: BC Managed Care – PPO | Admitting: Cardiovascular Disease

## 2018-12-11 VITALS — BP 137/68 | HR 78 | Temp 97.4°F | Ht 65.0 in | Wt 198.4 lb

## 2018-12-11 DIAGNOSIS — I739 Peripheral vascular disease, unspecified: Secondary | ICD-10-CM

## 2018-12-11 NOTE — Progress Notes (Signed)
12/11/2018 Mary Campbell   [birth year]  [id number]  Primary Physician Ralene Ok, MD Primary Cardiologist: Runell Gess MD [name], [place]  HPI:  Mary Campbell is a 53 y.o. 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 She was referred by Dr. Bufford Buttner, her primary cardiologist, for peripheral vascular valuation because of lifestyle limiting claudication.  I last saw her in the office 11/13/2018.Her cardiac risk factors are notable for 40-50-pack-year tobacco abuse currently trying to stop on Chantix, treated hypertension, diabetes and hyperlipidemia as well as family history with a father who had multiple stents myocardial infarction's. She is never had a heart attack or stroke. She currently denies chest pain or shortness of breath although recently she did have chest pain and was evaluated by Dr. Carmon Ginsberg. She had a negative Myoview and a normal 2D echo. She is complained of claudication over the last year which is fairly symmetric. This is not bothering her during her workday but when walking longer distances, out shopping, she does have to stop because of cramping in her calves. Doppler studies performed 09/26/2018 revealed ABIs of about 0.7 bilaterally with an occluded above-the-knee popliteal on the right and high-frequency signal in the proximal left SFA.  I performed peripheral angiography on her 10/18/2018 revealing short segment CTO mid to distal right SFA with three-vessel runoff and 95% / 60% segmental proximal left SFA stenosis with three-vessel runoff.  Performed directional atherectomy followed by drug-coated balloon angioplasty of the proximal left SFA with an excellent result.  Her claudication resolved and her Dopplers improved with a left ABI 0.84.  She wishes to proceed with right SFA intervention.  I performed right SFA directional arthrectomy followed by drug-coated balloon angioplasty on 11/22/2018 with excellent  angiographic result.  Her Dopplers improved and her claudication has resolved.  Of note, she did stop smoking on 11/20/2018.  She no longer has pain in her legs at night and is able to ambulate without limitation.     Current Meds  Medication Sig  . acetaminophen (TYLENOL) 500 MG tablet Take 1,000 mg by mouth every 6 (six) hours as needed for moderate pain or headache.  Marland Kitchen aspirin EC 81 MG tablet Take 81 mg by mouth daily.  . Azilsartan-Chlorthalidone (EDARBYCLOR) 40-25 MG TABS Take 1 tablet by mouth daily.  . clopidogrel (PLAVIX) 75 MG tablet Take 1 tablet (75 mg total) by mouth daily.  . diphenhydrAMINE (BENADRYL) 25 MG tablet Take 25 mg by mouth daily as needed for itching or allergies.  Marland Kitchen glimepiride (AMARYL) 4 MG tablet Take 2 mg by mouth daily with breakfast.   . Insulin Glargine, 1 Unit Dial, (TOUJEO SOLOSTAR) 300 UNIT/ML SOPN Inject 48 Units into the skin at bedtime.  . metFORMIN (GLUCOPHAGE) 1000 MG tablet Take 1,000 mg by mouth 2 (two) times daily.  . rosuvastatin (CRESTOR) 40 MG tablet Take 1 tablet (40 mg total) by mouth daily. (Patient taking differently: Take 40 mg by mouth at bedtime. )  . varenicline (CHANTIX STARTING MONTH PAK) 0.5 MG X 11 & 1 MG X 42 tablet Take 0.5-1 mg by mouth See admin instructions. Take one 0.5 mg tablet by mouth once daily for 3 days, then increase to one 0.5 mg tablet twice daily for 4 days, then increase to one 1 mg tablet twice daily.     Allergies  Allergen Reactions  . Codeine Hives  . Lisinopril Cough  . Sulfonamide Derivatives Hives    Social History  Socioeconomic History  . Marital status: Legally Separated    Spouse name: Not on file  . Number of children: Not on file  . Years of education: Not on file  . Highest education level: Not on file  Occupational History  . Not on file  Social Needs  . Financial resource strain: Not on file  . Food insecurity    Worry: Not on file    Inability: Not on file  . Transportation needs     Medical: Not on file    Non-medical: Not on file  Tobacco Use  . Smoking status: Current Every Day Smoker    Packs/day: 1.00    Types: Cigarettes  . Smokeless tobacco: Never Used  Substance and Sexual Activity  . Alcohol use: No  . Drug use: No  . Sexual activity: Yes    Birth control/protection: Surgical  Lifestyle  . Physical activity    Days per week: Not on file    Minutes per session: Not on file  . Stress: Not on file  Relationships  . Social Herbalist on phone: Not on file    Gets together: Not on file    Attends religious service: Not on file    Active member of club or organization: Not on file    Attends meetings of clubs or organizations: Not on file    Relationship status: Not on file  . Intimate partner violence    Fear of current or ex partner: Not on file    Emotionally abused: Not on file    Physically abused: Not on file    Forced sexual activity: Not on file  Other Topics Concern  . Not on file  Social History Narrative  . Not on file     Review of Systems: General: negative for chills, fever, night sweats or weight changes.  Cardiovascular: negative for chest pain, dyspnea on exertion, edema, orthopnea, palpitations, paroxysmal nocturnal dyspnea or shortness of breath Dermatological: negative for rash Respiratory: negative for cough or wheezing Urologic: negative for hematuria Abdominal: negative for nausea, vomiting, diarrhea, bright red blood per rectum, melena, or hematemesis Neurologic: negative for visual changes, syncope, or dizziness All other systems reviewed and are otherwise negative except as noted above.    Blood pressure 137/68, pulse 78, temperature (!) 97.4 F (36.3 C), height 5\' 5"  (1.651 m), weight 198 lb 6.4 oz (90 kg), SpO2 100 %.  General appearance: alert and no distress Neck: no adenopathy, no carotid bruit, no JVD, supple, symmetrical, trachea midline and thyroid not enlarged, symmetric, no tenderness/mass/nodules  Lungs: clear to auscultation bilaterally Heart: regular rate and rhythm, S1, S2 normal, no murmur, click, rub or gallop Extremities: extremities normal, atraumatic, no cyanosis or edema Pulses: 2+ and symmetric Skin: Skin color, texture, turgor normal. No rashes or lesions Neurologic: Alert and oriented X 3, normal strength and tone. Normal symmetric reflexes. Normal coordination and gait  EKG not performed today  ASSESSMENT AND PLAN:   Tobacco abuse Discontinue tobacco use on 11/20/2018  Claudication in peripheral vascular disease ([place]) History of peripheral arterial disease with lifestyle limiting claudication status post stage bilateral SFA intervention by myself on 10/18/2018 revascularizing the left SFA and 11/22/2018 revascularizing the right SFA CTO.  Her claudication has completely resolved and her Dopplers have improved.  We will get follow-up lower extremity arterial Doppler studies in 6 months and I will see her back after that .      Lorretta Harp MD FACP,FACC,FAHA, [facility]  12/11/2018 8:39 AM

## 2018-12-11 NOTE — Assessment & Plan Note (Signed)
Discontinue tobacco use on 11/20/2018

## 2018-12-11 NOTE — Patient Instructions (Signed)
Medication Instructions:  Your physician recommends that you continue on your current medications as directed. Please refer to the Current Medication list given to you today.  If you need a refill on your cardiac medications before your next appointment, please call your pharmacy.   Lab work: NONE  Testing/Procedures: IN 6 MONTHS Your physician has requested that you have a lower extremity arterial exercise duplex. During this test, exercise and ultrasound are used to evaluate arterial blood flow in the legs. Allow one hour for this exam. There are no restrictions or special instructions.  AND  Your physician has requested that you have an ankle brachial index (ABI). During this test an ultrasound and blood pressure cuff are used to evaluate the arteries that supply the arms and legs with blood. Allow thirty minutes for this exam. There are no restrictions or special instructions.   Follow-Up: At [facility], you and your health needs are our priority.  As part of our continuing mission to provide you with exceptional heart care, we have created designated Provider Care Teams.  These Care Teams include your primary Cardiologist (physician) and Advanced Practice Providers (APPs -  Physician Assistants and Nurse Practitioners) who all work together to provide you with the care you need, when you need it. You may see Dr Gwenlyn Found or one of the following Advanced Practice Providers on your designated Care Team:    Kerin Ransom, PA-C  [place], [place]  Coletta Memos, [place]  Your physician wants you to follow-up in: [place]. You will receive a reminder letter in the mail two months in advance. If you don't receive a letter, please call our office to schedule the follow-up appointment.

## 2018-12-11 NOTE — Assessment & Plan Note (Signed)
History of peripheral arterial disease with lifestyle limiting claudication status post stage bilateral SFA intervention by myself on 10/18/2018 revascularizing the left SFA and 11/22/2018 revascularizing the right SFA CTO.  Her claudication has completely resolved and her Dopplers have improved.  We will get follow-up lower extremity arterial Doppler studies in 6 months and I will see her back after that .

## 2019-02-08 ENCOUNTER — Ambulatory Visit: Payer: BC Managed Care – PPO | Admitting: Cardiovascular Disease

## 2019-02-19 ENCOUNTER — Telehealth: Payer: Self-pay

## 2019-02-19 NOTE — Telephone Encounter (Signed)
Attempted to call patient, LVM advising that at last visit with Dr.O'Neal it was stated to follow up in person- the day that she was scheduled is a virtual day, and not in office. I notified patient that we had openings the rest of that week but to call us so we could move her if this is what she wanted.  Left call back number.

## 2019-02-22 NOTE — Telephone Encounter (Signed)
Contacted patient, LVM advising that Tuesday was a virtual visit day- and if patient wanted to keep it or move it as they have her down as a office visit.  Left call back number for her to call back.

## 2019-02-25 NOTE — Progress Notes (Deleted)
Cardiology Office Note:   Date:  02/25/2019  NAME:  Mary Campbell    MRN: [id number] DOB:  06-Jul-1965   PCP:  Ralene Ok, MD  Cardiologist:  Reatha Harps, MD  Electrophysiologist:  None   Referring MD: Ralene Ok, MD   No chief complaint on file. ***  History of Present Illness:   Mary Campbell is a 54 y.o. female with a hx of PAD, DM, HTN, Tobacco abuse who presents for follow-up.   Problem List 1. PAD -s/p angioplasty L SFA 10/18/2018 -s/p angioplasty R SFA 12/11/2018 -Negative MPI 09/19/2018 2. Tobacco Abuse 3. Diabetes  -A1c 7.8 4. HLD -T chol 112, HDL 29, LDL 56, TG 154 5. HTN  Past Medical History: Past Medical History:  Diagnosis Date  . Chronic back pain   . Diabetes mellitus   . Hyperlipidemia   . Hypertension   . PAD (peripheral artery disease) (HCC)    a. PV angio 10/2018 - bilateral SFA disease s/p L intervention.  . Tobacco abuse     Past Surgical History: Past Surgical History:  Procedure Laterality Date  . ABDOMINAL AORTOGRAM W/LOWER EXTREMITY Right 10/18/2018   Procedure: ABDOMINAL AORTOGRAM W/LOWER EXTREMITY;  Surgeon: Runell Gess, MD;  Location: [facility] INVASIVE CV LAB;  Service: Cardiovascular;  Laterality: Right;  . ABDOMINAL HYSTERECTOMY    . CHOLECYSTECTOMY    . PERIPHERAL VASCULAR ATHERECTOMY Left 10/18/2018   Procedure: PERIPHERAL VASCULAR ATHERECTOMY;  Surgeon: Runell Gess, MD;  Location: [facility] INVASIVE CV LAB;  Service: Cardiovascular;  Laterality: Left;  SFA  . PERIPHERAL VASCULAR ATHERECTOMY Right 11/22/2018   Procedure: PERIPHERAL VASCULAR ATHERECTOMY;  Surgeon: Runell Gess, MD;  Location: [facility] INVASIVE CV LAB;  Service: Cardiovascular;  Laterality: Right;  SFA  . PERIPHERAL VASCULAR BALLOON ANGIOPLASTY Right 11/22/2018   Procedure: PERIPHERAL VASCULAR BALLOON ANGIOPLASTY;  Surgeon: Runell Gess, MD;  Location: [facility] INVASIVE CV LAB;  Service: Cardiovascular;  Laterality: Right;  SFA    Current Medications: No outpatient  medications have been marked as taking for the 02/26/19 encounter (Appointment) with O'Neal, Ronnald Ramp, MD.     Allergies:    Codeine, Lisinopril, and Sulfonamide derivatives   Social History: Social History   Socioeconomic History  . Marital status: Legally Separated    Spouse name: Not on file  . Number of children: Not on file  . Years of education: Not on file  . Highest education level: Not on file  Occupational History  . Not on file  Tobacco Use  . Smoking status: Current Every Day Smoker    Packs/day: 1.00    Types: Cigarettes  . Smokeless tobacco: Never Used  Substance and Sexual Activity  . Alcohol use: No  . Drug use: No  . Sexual activity: Yes    Birth control/protection: Surgical  Other Topics Concern  . Not on file  Social History Narrative  . Not on file   Social Determinants of Health   Financial Resource Strain:   . Difficulty of Paying Living Expenses: Not on file  Food Insecurity:   . Worried About Programme researcher, broadcasting/film/video in the Last Year: Not on file  . Ran Out of Food in the Last Year: Not on file  Transportation Needs:   . Lack of Transportation (Medical): Not on file  . Lack of Transportation (Non-Medical): Not on file  Physical Activity:   . Days of Exercise per Week: Not on file  . Minutes of Exercise per Session: Not on file  Stress:   .  Feeling of Stress : Not on file  Social Connections:   . Frequency of Communication with Friends and Family: Not on file  . Frequency of Social Gatherings with Friends and Family: Not on file  . Attends Religious Services: Not on file  . Active Member of Clubs or Organizations: Not on file  . Attends Archivist Meetings: Not on file  . Marital Status: Not on file     Family History: The patient's ***family history includes Diabetes in her mother; Heart attack in her father; Heart disease in her father; Hyperlipidemia in her father and mother; Hypertension in her father and mother.  ROS:     All other ROS reviewed and negative. Pertinent positives noted in the HPI.     EKGs/Labs/Other Studies Reviewed:   The following studies were personally reviewed by me today:  EKG:  EKG is *** ordered today.  The ekg ordered today demonstrates ***, and was personally reviewed by me.   TTE 09/19/2018 1. There is moderate to severe thickening of the the right ventricular wall. There is a prominent anterior pericardial fat pat so this observation of the RV possibly represents prominent epicardial fat. There is no reduced RV function or dilation to  suggest a diagnosis of ARVD. Would recommend a cardiac MR for further characterization. 2. The left ventricle has hyperdynamic systolic function, with an ejection fraction of >65%. The cavity size was normal. Left ventricular diastolic Doppler parameters are consistent with impaired relaxation. No evidence of left ventricular regional wall motion abnormalities. 3. The right ventricle has normal systolic function. The cavity was normal. There is moderately increased right ventricular wall thickness. 4. Trivial pericardial effusion is present. 5. The mitral valve is grossly normal. 6. The tricuspid valve is grossly normal. 7. The aortic valve is tricuspid. Mild thickening of the aortic valve. No stenosis of the aortic valve. 8. The aorta is normal unless otherwise noted. 9. The aortic root and ascending aorta are normal in size and structure. 10. Prominent crista terminalis present. 11. When compared to the prior study: No prior.  NM MPI 09/19/2018  Nuclear stress EF: 70%.  There was no ST segment deviation noted during stress.  The study is normal.  This is a low risk study.  The left ventricular ejection fraction is hyperdynamic (>65%).  No ischemia.  Recent Labs: 10/15/2018: TSH 2.450 11/13/2018: BUN 8; Creatinine, Ser 0.96; Hemoglobin 11.6; Platelets 264; Potassium 4.0; Sodium 138   Recent Lipid Panel    Component Value  Date/Time   CHOL 112 11/13/2018 1102   TRIG 154 (H) 11/13/2018 1102   HDL 29 (L) 11/13/2018 1102   CHOLHDL 3.9 11/13/2018 1102   LDLCALC 56 11/13/2018 1102    Physical Exam:   VS:  There were no vitals taken for this visit.   Wt Readings from Last 3 Encounters:  12/11/18 198 lb 6.4 oz (90 kg)  11/22/18 184 lb (83.5 kg)  11/13/18 184 lb (83.5 kg)    General: Well nourished, well developed, in no acute distress Heart: Atraumatic, normal size  Eyes: PEERLA, EOMI  Neck: Supple, no JVD Endocrine: No thryomegaly Cardiac: Normal S1, S2; RRR; no murmurs, rubs, or gallops Lungs: Clear to auscultation bilaterally, no wheezing, rhonchi or rales  Abd: Soft, nontender, no hepatomegaly  Ext: No edema, pulses 2+ Musculoskeletal: No deformities, BUE and BLE strength normal and equal Skin: Warm and dry, no rashes   Neuro: Alert and oriented to person, place, time, and situation, CNII-XII grossly intact, no  focal deficits  Psych: Normal mood and affect   ASSESSMENT:   Mary Campbell is a 54 y.o. female who presents for the following: No diagnosis found.  PLAN:   There are no diagnoses linked to this encounter.  Disposition: No follow-ups on file.  Medication Adjustments/Labs and Tests Ordered: Current medicines are reviewed at length with the patient today.  Concerns regarding medicines are outlined above.  No orders of the defined types were placed in this encounter.  No orders of the defined types were placed in this encounter.   There are no Patient Instructions on file for this visit.   Signed, Lenna Gilford. Flora Lipps, MD [facility]  [address], [address] [place], [place] [address] [phone]  02/25/2019 8:37 PM

## 2019-02-26 ENCOUNTER — Ambulatory Visit: Payer: BC Managed Care – PPO | Admitting: Cardiovascular Disease

## 2019-05-17 ENCOUNTER — Other Ambulatory Visit: Payer: Self-pay | Admitting: Cardiovascular Disease

## 2019-06-10 ENCOUNTER — Ambulatory Visit (HOSPITAL_COMMUNITY)
Admission: RE | Admit: 2019-06-10 | Payer: Managed Care, Other (non HMO) | Source: Ambulatory Visit | Attending: Cardiovascular Disease | Admitting: Cardiovascular Disease

## 2019-07-09 ENCOUNTER — Ambulatory Visit (HOSPITAL_COMMUNITY): Admission: RE | Admit: 2019-07-09 | Payer: Managed Care, Other (non HMO) | Source: Ambulatory Visit

## 2019-07-26 ENCOUNTER — Ambulatory Visit (HOSPITAL_COMMUNITY)
Admission: RE | Admit: 2019-07-26 | Payer: Managed Care, Other (non HMO) | Source: Ambulatory Visit | Attending: Cardiovascular Disease | Admitting: Cardiovascular Disease

## 2019-10-17 ENCOUNTER — Other Ambulatory Visit: Payer: Self-pay | Admitting: Physician Assistant

## 2019-10-18 NOTE — Telephone Encounter (Signed)
Error

## 2019-11-21 ENCOUNTER — Other Ambulatory Visit: Payer: Self-pay | Admitting: Physician Assistant

## 2019-11-22 NOTE — Telephone Encounter (Signed)
This is a NL pt, Dr. Flora Lipps

## 2019-12-22 NOTE — Progress Notes (Deleted)
Cardiology Office Note:   Date:  12/22/2019  NAME:  Mary Campbell    MRN: [id number] DOB:  01/27/1965   PCP:  Ralene Ok, MD  Cardiologist:  Reatha Harps, MD  Electrophysiologist:  None   Referring MD: Ralene Ok, MD   No chief complaint on file. ***  History of Present Illness:   Mary Campbell is a 54 y.o. female with a hx of DM, HTN, HLD, PAD, tobacco abuse who presents for follow-up.   Problem List 1. DM -A1c 7.8 2. PAD -CTO R SFA -95% L SFA -> L angioplasty 10/18/2018 -negative stress 3. Obesity 4. HLD -T chol 112, HDL 29, LDL 56, TG 154 5. HTN  Past Medical History: Past Medical History:  Diagnosis Date  . Chronic back pain   . Diabetes mellitus   . Hyperlipidemia   . Hypertension   . PAD (peripheral artery disease) (HCC)    a. PV angio 10/2018 - bilateral SFA disease s/p L intervention.  . Tobacco abuse     Past Surgical History: Past Surgical History:  Procedure Laterality Date  . ABDOMINAL AORTOGRAM W/LOWER EXTREMITY Right 10/18/2018   Procedure: ABDOMINAL AORTOGRAM W/LOWER EXTREMITY;  Surgeon: Runell Gess, MD;  Location[facility] INVASIVE CV LAB;  Service: Cardiovascular;  Laterality: Right;  . ABDOMINAL HYSTERECTOMY    . CHOLECYSTECTOMY    . PERIPHERAL VASCULAR ATHERECTOMY Left 10/18/2018   Procedure: PERIPHERAL VASCULAR ATHERECTOMY;  Surgeon: Runell Gess, MD;  Location: [facility] INVASIVE CV LAB;  Service: Cardiovascular;  Laterality: Left;  SFA  . PERIPHERAL VASCULAR ATHERECTOMY Right 11/22/2018   Procedure: PERIPHERAL VASCULAR ATHERECTOMY;  Surgeon: Runell Gess, MD;  Location: [facility] INVASIVE CV LAB;  Service: Cardiovascular;  Laterality: Right;  SFA  . PERIPHERAL VASCULAR BALLOON ANGIOPLASTY Right 11/22/2018   Procedure: PERIPHERAL VASCULAR BALLOON ANGIOPLASTY;  Surgeon: Runell Gess, MD;  Location: [facility] INVASIVE CV LAB;  Service: Cardiovascular;  Laterality: Right;  SFA    Current Medications: No outpatient medications have been marked as  taking for the 12/25/19 encounter (Appointment) with O'Neal, Ronnald Ramp, MD.     Allergies:    Codeine, Lisinopril, and Sulfonamide derivatives   Social History: Social History   Socioeconomic History  . Marital status: Legally Separated    Spouse name: Not on file  . Number of children: Not on file  . Years of education: Not on file  . Highest education level: Not on file  Occupational History  . Not on file  Tobacco Use  . Smoking status: Current Every Day Smoker    Packs/day: 1.00    Types: Cigarettes  . Smokeless tobacco: Never Used  Vaping Use  . Vaping Use: Never used  Substance and Sexual Activity  . Alcohol use: No  . Drug use: No  . Sexual activity: Yes    Birth control/protection: Surgical  Other Topics Concern  . Not on file  Social History Narrative  . Not on file   Social Determinants of Health   Financial Resource Strain:   . Difficulty of Paying Living Expenses: Not on file  Food Insecurity:   . Worried About Programme researcher, broadcasting/film/video in the Last Year: Not on file  . Ran Out of Food in the Last Year: Not on file  Transportation Needs:   . Lack of Transportation (Medical): Not on file  . Lack of Transportation (Non-Medical): Not on file  Physical Activity:   . Days of Exercise per Week: Not on file  . Minutes of Exercise  per Session: Not on file  Stress:   . Feeling of Stress : Not on file  Social Connections:   . Frequency of Communication with Friends and Family: Not on file  . Frequency of Social Gatherings with Friends and Family: Not on file  . Attends Religious Services: Not on file  . Active Member of Clubs or Organizations: Not on file  . Attends Banker Meetings: Not on file  . Marital Status: Not on file     Family History: The patient's ***family history includes Diabetes in her mother; Heart attack in her father; Heart disease in her father; Hyperlipidemia in her father and mother; Hypertension in her father and  mother.  ROS:   All other ROS reviewed and negative. Pertinent positives noted in the HPI.     EKGs/Labs/Other Studies Reviewed:   The following studies were personally reviewed by me today:  EKG:  EKG is *** ordered today.  The ekg ordered today demonstrates ***, and was personally reviewed by me.   NM Stress 09/19/2018  Nuclear stress EF: 70%.  There was no ST segment deviation noted during stress.  The study is normal.  This is a low risk study.  The left ventricular ejection fraction is hyperdynamic (>65%).  No ischemia.   TTE 09/19/2018 1. There is moderate to severe thickening of the the right ventricular  wall. There is a prominent anterior pericardial fat pat so this  observation of the RV possibly represents prominent epicardial fat. There  is no reduced RV function or dilation to  suggest a diagnosis of ARVD. Would recommend a cardiac MR for further  characterization.  2. The left ventricle has hyperdynamic systolic function, with an  ejection fraction of >65%. The cavity size was normal. Left ventricular  diastolic Doppler parameters are consistent with impaired relaxation. No  evidence of left ventricular regional wall  motion abnormalities.  3. The right ventricle has normal systolic function. The cavity was  normal. There is moderately increased right ventricular wall thickness.  4. Trivial pericardial effusion is present.  5. The mitral valve is grossly normal.  6. The tricuspid valve is grossly normal.  7. The aortic valve is tricuspid. Mild thickening of the aortic valve. No  stenosis of the aortic valve.  8. The aorta is normal unless otherwise noted.  9. The aortic root and ascending aorta are normal in size and structure.  10. Prominent crista terminalis present.  11. When compared to the prior study: No prior.     Recent Labs: No results found for requested labs within last 8760 hours.   Recent Lipid Panel    Component Value Date/Time    CHOL 112 11/13/2018 1102   TRIG 154 (H) 11/13/2018 1102   HDL 29 (L) 11/13/2018 1102   CHOLHDL 3.9 11/13/2018 1102   LDLCALC 56 11/13/2018 1102    Physical Exam:   VS:  There were no vitals taken for this visit.   Wt Readings from Last 3 Encounters:  12/11/18 198 lb 6.4 oz (90 kg)  11/22/18 184 lb (83.5 kg)  11/13/18 184 lb (83.5 kg)    General: Well nourished, well developed, in no acute distress Heart: Atraumatic, normal size  Eyes: PEERLA, EOMI  Neck: Supple, no JVD Endocrine: No thryomegaly Cardiac: Normal S1, S2; RRR; no murmurs, rubs, or gallops Lungs: Clear to auscultation bilaterally, no wheezing, rhonchi or rales  Abd: Soft, nontender, no hepatomegaly  Ext: No edema, pulses 2+ Musculoskeletal: No deformities, BUE and BLE  strength normal and equal Skin: Warm and dry, no rashes   Neuro: Alert and oriented to person, place, time, and situation, CNII-XII grossly intact, no focal deficits  Psych: Normal mood and affect   ASSESSMENT:   Mary Campbell is a 55 y.o. female who presents for the following: No diagnosis found.  PLAN:   There are no diagnoses linked to this encounter.  Disposition: No follow-ups on file.  Medication Adjustments/Labs and Tests Ordered: Current medicines are reviewed at length with the patient today.  Concerns regarding medicines are outlined above.  No orders of the defined types were placed in this encounter.  No orders of the defined types were placed in this encounter.   There are no Patient Instructions on file for this visit.   Time Spent with Patient: I have spent a total of *** minutes with patient reviewing hospital notes, telemetry, EKGs, labs and examining the patient as well as establishing an assessment and plan that was discussed with the patient.  > 50% of time was spent in direct patient care.  Signed, Lenna Gilford. Flora Lipps, MD [facility]  [address], [address] [place], [place] [address] [phone]   12/22/2019 2:56 PM

## 2019-12-25 ENCOUNTER — Ambulatory Visit: Payer: Managed Care, Other (non HMO) | Admitting: Cardiovascular Disease

## 2019-12-25 DIAGNOSIS — I739 Peripheral vascular disease, unspecified: Secondary | ICD-10-CM

## 2019-12-25 DIAGNOSIS — Z720 Tobacco use: Secondary | ICD-10-CM

## 2019-12-25 DIAGNOSIS — E669 Obesity, unspecified: Secondary | ICD-10-CM

## 2019-12-25 DIAGNOSIS — I10 Essential (primary) hypertension: Secondary | ICD-10-CM

## 2019-12-25 DIAGNOSIS — E782 Mixed hyperlipidemia: Secondary | ICD-10-CM

## 2020-01-03 ENCOUNTER — Other Ambulatory Visit: Payer: Self-pay | Admitting: Cardiovascular Disease

## 2020-01-06 NOTE — Progress Notes (Signed)
Cardiology Office Note:   Date:  01/08/2020  NAME:  Mary Campbell    MRN: [id number] DOB:  26-Oct-1965   PCP:  Ralene Ok, MD  Cardiologist:  Reatha Harps, MD   Referring MD: Ralene Ok, MD   Chief Complaint  Patient presents with  . PAD   History of Present Illness:   Mary Campbell is a 54 y.o. female with a hx of PAD, DM, tobacco abuse, HLD, obesity who presents for follow-up.  She is lost roughly 10 pounds since her last visit.  Blood pressure 130/63.  She denies any chest pain or trouble breathing.  She is walking up to 30 minutes/day without any major limitations.  She had quit smoking for roughly 6 months but is started back.  She is due for blood work by her primary care physician next month.  She denies any chest pain, shortness of breath or palpitations in the office today.  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  Echocardiogram was normal.  She reports no claudication symptoms.  She will see Dr. Gery Pray next week.  She will need repeat leg ultrasounds.  Remains on aspirin and Plavix.  She also needs a refill on her Chantix.  Problem List 1. PAD -angioplasty L SFA 10/19/2018 2. DM -A1c 7.8 3. HLD -T chol 112, HDL 29, LDL 56, TG 154 4. Tobacco abuse 5. Obesity  Past Medical History: Past Medical History:  Diagnosis Date  . Chronic back pain   . Diabetes mellitus   . Hyperlipidemia   . Hypertension   . PAD (peripheral artery disease) (HCC)    a. PV angio 10/2018 - bilateral SFA disease s/p L intervention.  Marland Kitchen PAD (peripheral artery disease) (HCC)   . Tobacco abuse     Past Surgical History: Past Surgical History:  Procedure Laterality Date  . ABDOMINAL AORTOGRAM W/LOWER EXTREMITY Right 10/18/2018   Procedure: ABDOMINAL AORTOGRAM W/LOWER EXTREMITY;  Surgeon: Runell Gess, MD;  Location: [facility] INVASIVE CV LAB;  Service: Cardiovascular;  Laterality: Right;  . ABDOMINAL HYSTERECTOMY    . CHOLECYSTECTOMY    . PERIPHERAL VASCULAR ATHERECTOMY Left  10/18/2018   Procedure: PERIPHERAL VASCULAR ATHERECTOMY;  Surgeon: Runell Gess, MD;  Location: [facility] INVASIVE CV LAB;  Service: Cardiovascular;  Laterality: Left;  SFA  . PERIPHERAL VASCULAR ATHERECTOMY Right 11/22/2018   Procedure: PERIPHERAL VASCULAR ATHERECTOMY;  Surgeon: Runell Gess, MD;  Location: [facility] INVASIVE CV LAB;  Service: Cardiovascular;  Laterality: Right;  SFA  . PERIPHERAL VASCULAR BALLOON ANGIOPLASTY Right 11/22/2018   Procedure: PERIPHERAL VASCULAR BALLOON ANGIOPLASTY;  Surgeon: Runell Gess, MD;  Location: [facility] INVASIVE CV LAB;  Service: Cardiovascular;  Laterality: Right;  SFA    Current Medications: Current Meds  Medication Sig  . acetaminophen (TYLENOL) 500 MG tablet Take 1,000 mg by mouth every 6 (six) hours as needed for moderate pain or headache.  Marland Kitchen aspirin EC 81 MG tablet Take 81 mg by mouth daily.  . Azilsartan-Chlorthalidone (EDARBYCLOR) 40-25 MG TABS Take 1 tablet by mouth daily.  . clopidogrel (PLAVIX) 75 MG tablet TAKE 1 TABLET BY MOUTH ONCE DAILY(CALL TO MAKE APPT WITH PROVIDER FOR FURTHER REFILLS 1ST ATTEMPT)  . diphenhydrAMINE (BENADRYL) 25 MG tablet Take 25 mg by mouth daily as needed for itching or allergies.  Marland Kitchen glimepiride (AMARYL) 4 MG tablet Take 2 mg by mouth daily with breakfast.   . Insulin Glargine, 1 Unit Dial, (TOUJEO SOLOSTAR) 300 UNIT/ML SOPN Inject 48 Units into the skin at bedtime.  Marland Kitchen  metFORMIN (GLUCOPHAGE) 1000 MG tablet Take 1,000 mg by mouth 2 (two) times daily.  . [DISCONTINUED] rosuvastatin (CRESTOR) 40 MG tablet Take 1 tablet by mouth once daily  . [DISCONTINUED] varenicline (CHANTIX STARTING MONTH PAK) 0.5 MG X 11 & 1 MG X 42 tablet Take 0.5-1 mg by mouth See admin instructions. Take one 0.5 mg tablet by mouth once daily for 3 days, then increase to one 0.5 mg tablet twice daily for 4 days, then increase to one 1 mg tablet twice daily.     Allergies:    Codeine, Lisinopril, and Sulfonamide derivatives   Social History: Social  History   Socioeconomic History  . Marital status: Legally Separated    Spouse name: Not on file  . Number of children: Not on file  . Years of education: Not on file  . Highest education level: Not on file  Occupational History  . Not on file  Tobacco Use  . Smoking status: Current Every Day Smoker    Packs/day: 1.00    Types: Cigarettes  . Smokeless tobacco: Never Used  Vaping Use  . Vaping Use: Never used  Substance and Sexual Activity  . Alcohol use: No  . Drug use: No  . Sexual activity: Yes    Birth control/protection: Surgical  Other Topics Concern  . Not on file  Social History Narrative  . Not on file   Social Determinants of Health   Financial Resource Strain: Not on file  Food Insecurity: Not on file  Transportation Needs: Not on file  Physical Activity: Not on file  Stress: Not on file  Social Connections: Not on file     Family History: The patient's family history includes Diabetes in her mother; Heart attack in her father; Heart disease in her father; Hyperlipidemia in her father and mother; Hypertension in her father and mother.  ROS:   All other ROS reviewed and negative. Pertinent positives noted in the HPI.     EKGs/Labs/Other Studies Reviewed:   The following studies were personally reviewed by me today:  EKG:  EKG is ordered today.  The ekg ordered today demonstrates normal sinus rhythm, heart rate 86, old anteroseptal infarct, and was personally reviewed by me.   NM Stress 09/2018  Nuclear stress EF: 70%.  There was no ST segment deviation noted during stress.  The study is normal.  This is a low risk study.  The left ventricular ejection fraction is hyperdynamic (>65%).  No ischemia.  TTE 09/19/2018   1. There is moderate to severe thickening of the the right ventricular  wall. There is a prominent anterior pericardial fat pat so this  observation of the RV possibly represents prominent epicardial fat. There  is no reduced RV  function or dilation to  suggest a diagnosis of ARVD. Would recommend a cardiac MR for further  characterization.  2. The left ventricle has hyperdynamic systolic function, with an  ejection fraction of >65%. The cavity size was normal. Left ventricular  diastolic Doppler parameters are consistent with impaired relaxation. No  evidence of left ventricular regional wall  motion abnormalities.  3. The right ventricle has normal systolic function. The cavity was  normal. There is moderately increased right ventricular wall thickness.  4. Trivial pericardial effusion is present.  5. The mitral valve is grossly normal.  6. The tricuspid valve is grossly normal.  7. The aortic valve is tricuspid. Mild thickening of the aortic valve. No  stenosis of the aortic valve.  8. The  aorta is normal unless otherwise noted.  9. The aortic root and ascending aorta are normal in size and structure.  10. Prominent crista terminalis present.  11. When compared to the prior study: No prior.   Recent Labs: No results found for requested labs within last 8760 hours.   Recent Lipid Panel    Component Value Date/Time   CHOL 112 11/13/2018 1102   TRIG 154 (H) 11/13/2018 1102   HDL 29 (L) 11/13/2018 1102   CHOLHDL 3.9 11/13/2018 1102   LDLCALC 56 11/13/2018 1102    Physical Exam:   VS:  BP 130/63   Pulse 86   Temp (!) 97 F (36.1 C)   Ht 5\' 4"  (1.626 m)   Wt 183 lb 12.8 oz (83.4 kg)   SpO2 97%   BMI 31.55 kg/m    Wt Readings from Last 3 Encounters:  01/08/20 183 lb 12.8 oz (83.4 kg)  12/11/18 198 lb 6.4 oz (90 kg)  11/22/18 184 lb (83.5 kg)    General: Well nourished, well developed, in no acute distress Head: Atraumatic, normal size  Eyes: PEERLA, EOMI  Neck: Supple, no JVD Endocrine: No thryomegaly Cardiac: Normal S1, S2; RRR; no murmurs, rubs, or gallops Lungs: Clear to auscultation bilaterally, no wheezing, rhonchi or rales  Abd: Soft, nontender, no hepatomegaly  Ext: No  edema, pulses 2+ Musculoskeletal: No deformities, BUE and BLE strength normal and equal Skin: Warm and dry, no rashes   Neuro: Alert and oriented to person, place, time, and situation, CNII-XII grossly intact, no focal deficits  Psych: Normal mood and affect   ASSESSMENT:   Mary Campbell is a 54 y.o. female who presents for the following: 1. PAD (peripheral artery disease) (HCC)   2. Mixed hyperlipidemia   3. Essential hypertension   4. Tobacco abuse     PLAN:   1. PAD (peripheral artery disease) (HCC) 2. Mixed hyperlipidemia --angioplasty L SFA 10/19/2018 -Doing well.  No claudication symptoms.  Has started back smoking.  Refill for Chantix prescribed.  She will have lab work next week by her primary care physician.  She is lost 10 pounds.  We will follow-up the results of her cholesterol.  Blood pressure at goal. -Continue aspirin Plavix and statin.  Likely will come off Plavix at the discretion of Dr. Gery PrayBarry. -Her goal LDL cholesterol less than 70.  3. Essential hypertension -Well-controlled on current medications.  4. Tobacco abuse -Chantix prescription prescribed.  Take care  Disposition: Return in about 1 year (around 01/07/2021).  Medication Adjustments/Labs and Tests Ordered: Current medicines are reviewed at length with the patient today.  Concerns regarding medicines are outlined above.  Orders Placed This Encounter  Procedures  . EKG 12-Lead   Meds ordered this encounter  Medications  . rosuvastatin (CRESTOR) 40 MG tablet    Sig: Take 1 tablet (40 mg total) by mouth daily.    Dispense:  90 tablet    Refill:  0  . varenicline (CHANTIX STARTING MONTH PAK) 0.5 MG X 11 & 1 MG X 42 tablet    Sig: Take 0.5-1 mg by mouth See admin instructions. Take one 0.5 mg tablet by mouth once daily for 3 days, then increase to one 0.5 mg tablet twice daily for 4 days, then increase to one 1 mg tablet twice daily.    Dispense:  53 tablet    Refill:  0    Patient Instructions   Medication Instructions:  The current medical regimen is effective;  continue present  plan and medications.  *If you need a refill on your cardiac medications before your next appointment, please call your pharmacy*  Follow-Up: At [facility], you and your health needs are our priority.  As part of our continuing mission to provide you with exceptional heart care, we have created designated Provider Care Teams.  These Care Teams include your primary Cardiologist (physician) and Advanced Practice Providers (APPs -  Physician Assistants and Nurse Practitioners) who all work together to provide you with the care you need, when you need it.  We recommend signing up for the patient portal called "MyChart".  Sign up information is provided on this After Visit Summary.  MyChart is used to connect with patients for Virtual Visits (Telemedicine).  Patients are able to view lab/test results, encounter notes, upcoming appointments, etc.  Non-urgent messages can be sent to your provider as well.   To learn more about what you can do with MyChart, go to [email or web address].    Your next appointment:   12 month(s)  The format for your next appointment:   In Person  Provider:   Lennie Odor, MD       Time Spent with Patient: I have spent a total of 25 minutes with patient reviewing hospital notes, telemetry, EKGs, labs and examining the patient as well as establishing an assessment and plan that was discussed with the patient.  > 50% of time was spent in direct patient care.  Signed, Lenna Gilford. Flora Lipps, MD [facility]  [address], [address] [place], [place] [address] [phone]  01/08/2020 8:21 AM

## 2020-01-08 ENCOUNTER — Encounter: Payer: Self-pay | Admitting: Cardiovascular Disease

## 2020-01-08 ENCOUNTER — Other Ambulatory Visit: Payer: Self-pay

## 2020-01-08 ENCOUNTER — Ambulatory Visit (INDEPENDENT_AMBULATORY_CARE_PROVIDER_SITE_OTHER): Payer: Managed Care, Other (non HMO) | Admitting: Cardiovascular Disease

## 2020-01-08 VITALS — BP 130/63 | HR 86 | Temp 97.0°F | Ht 64.0 in | Wt 183.8 lb

## 2020-01-08 DIAGNOSIS — E782 Mixed hyperlipidemia: Secondary | ICD-10-CM | POA: Diagnosis not present

## 2020-01-08 DIAGNOSIS — I10 Essential (primary) hypertension: Secondary | ICD-10-CM | POA: Diagnosis not present

## 2020-01-08 DIAGNOSIS — Z720 Tobacco use: Secondary | ICD-10-CM

## 2020-01-08 DIAGNOSIS — I739 Peripheral vascular disease, unspecified: Secondary | ICD-10-CM

## 2020-01-08 MED ORDER — ROSUVASTATIN CALCIUM 40 MG PO TABS: 40.0000 mg | ORAL_TABLET | Freq: Every day | ORAL | 0 refills | Status: DC

## 2020-01-08 MED ORDER — CHANTIX STARTING MONTH PAK 0.5 MG X 11 & 1 MG X 42 PO TABS: 0.5000 mg | ORAL_TABLET | ORAL | 0 refills | Status: DC

## 2020-01-08 NOTE — Patient Instructions (Signed)

## 2020-01-22 ENCOUNTER — Other Ambulatory Visit: Payer: Self-pay | Admitting: Cardiovascular Disease

## 2020-01-26 ENCOUNTER — Other Ambulatory Visit: Payer: Self-pay | Admitting: Cardiovascular Disease

## 2020-02-07 ENCOUNTER — Other Ambulatory Visit: Payer: Self-pay

## 2020-02-07 NOTE — Telephone Encounter (Signed)
This should go to Dr. Flora Lipps I have not seen pt recently

## 2020-02-10 ENCOUNTER — Other Ambulatory Visit: Payer: Self-pay

## 2020-02-10 MED ORDER — CLOPIDOGREL BISULFATE 75 MG PO TABS: ORAL_TABLET | ORAL | 1 refills | Status: DC

## 2020-02-10 MED ORDER — CHANTIX STARTING MONTH PAK 0.5 MG X 11 & 1 MG X 42 PO TABS: 0.5000 mg | ORAL_TABLET | ORAL | 0 refills | Status: DC

## 2020-02-10 NOTE — Telephone Encounter (Signed)
Refill chantix?  Thanks.

## 2020-02-11 NOTE — Telephone Encounter (Signed)
chantix refilled 02/10/20

## 2020-02-11 NOTE — Telephone Encounter (Signed)
plavix refilled on 02/10/2020

## 2020-02-14 ENCOUNTER — Other Ambulatory Visit: Payer: Self-pay

## 2020-02-14 ENCOUNTER — Encounter: Payer: Self-pay | Admitting: Cardiovascular Disease

## 2020-02-14 ENCOUNTER — Ambulatory Visit (INDEPENDENT_AMBULATORY_CARE_PROVIDER_SITE_OTHER): Payer: Managed Care, Other (non HMO) | Admitting: Cardiovascular Disease

## 2020-02-14 DIAGNOSIS — I739 Peripheral vascular disease, unspecified: Secondary | ICD-10-CM

## 2020-02-14 NOTE — Addendum Note (Signed)
Addended by: Bernita Buffy on: 02/14/2020 03:36 PM   Modules accepted: Orders

## 2020-02-14 NOTE — Progress Notes (Signed)
02/14/2020 Leith Hedlund   [birth year]  [id number]  Primary Physician Ralene Ok, MD Primary Cardiologist: Runell Gess MD [name], [place]  HPI:  Mary Campbell is a 55 y.o.  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 She was referred by Dr. Flora Lipps , her primary cardiologist, for peripheral vascular valuation because of lifestyle limiting claudication.I last saw her in the office  12/11/2018.Her cardiac risk factors are notable for 40-50-pack-year tobacco abuse currently trying to stop on Chantix, treated hypertension, diabetes and hyperlipidemia as well as family history with a father who had multiple stents myocardial infarction's. She is never had a heart attack or stroke. She currently denies chest pain or shortness of breath although recently she did have chest pain and was evaluated by Dr. Carmon Ginsberg. She had a negative Myoview and a normal 2D echo. She is complained of claudication over the last year which is fairly symmetric. This is not bothering her during her workday but when walking longer distances, out shopping, she does have to stop because of cramping in her calves. Doppler studies performed 09/26/2018 revealed ABIs of about 0.7 bilaterally with an occluded above-the-knee popliteal on the right and high-frequency signal in the proximal left SFA.  I performed peripheral angiography on her 10/18/2018 revealing short segment CTO mid to distal right SFA with three-vessel runoff and 95% / 60% segmental proximal left SFA stenosis with three-vessel runoff. Performed directional atherectomy followed by drug-coated balloon angioplasty of the proximal left SFA with an excellent result. Her claudication resolved and her Dopplers improved with a left ABI 0.84. She wishes to proceed with right SFA intervention.  I performed right SFA directional arthrectomy followed by drug-coated balloon angioplasty on 11/22/2018 with excellent  angiographic result.  Her Dopplers improved and her claudication has resolved.  Of note, she did stop smoking on 11/20/2018.  She no longer has pain in her legs at night and is able to ambulate without limitation.  Since I saw her back a little over a year ago she did go back to smoking but is trying to quit.  She is on Chantix.  She denies chest pain or shortness of breath.  She walks a half an hour a day without limitation.  Her Dopplers performed over a year ago showed marked improvement with a right ABI of 0.99 and a left of 0.87.   Current Meds  Medication Sig  . acetaminophen (TYLENOL) 500 MG tablet Take 1,000 mg by mouth every 6 (six) hours as needed for moderate pain or headache.  Marland Kitchen aspirin EC 81 MG tablet Take 81 mg by mouth daily.  . Azilsartan-Chlorthalidone (EDARBYCLOR) 40-25 MG TABS Take 1 tablet by mouth daily.  . clopidogrel (PLAVIX) 75 MG tablet TAKE 1 TABLET BY MOUTH ONCE DAILY(CALL TO MAKE APPT WITH PROVIDER FOR FURTHER REFILLS 1ST ATTEMPT)  . diphenhydrAMINE (BENADRYL) 25 MG tablet Take 25 mg by mouth daily as needed for itching or allergies.  Marland Kitchen glimepiride (AMARYL) 4 MG tablet Take 2 mg by mouth daily with breakfast.   . Insulin Glargine, 1 Unit Dial, (TOUJEO SOLOSTAR) 300 UNIT/ML SOPN Inject 48 Units into the skin at bedtime.  . metFORMIN (GLUCOPHAGE) 1000 MG tablet Take 1,000 mg by mouth 2 (two) times daily.  . rosuvastatin (CRESTOR) 40 MG tablet Take 1 tablet (40 mg total) by mouth daily.  . varenicline (CHANTIX STARTING MONTH PAK) 0.5 MG X 11 & 1 MG X 42 tablet Take 0.5-1 mg by mouth  See admin instructions. Take one 0.5 mg tablet by mouth once daily for 3 days, then increase to one 0.5 mg tablet twice daily for 4 days, then increase to one 1 mg tablet twice daily.     Allergies  Allergen Reactions  . Codeine Hives  . Lisinopril Cough  . Sulfonamide Derivatives Hives    Social History   Socioeconomic History  . Marital status: Legally Separated    Spouse name: Not  on file  . Number of children: Not on file  . Years of education: Not on file  . Highest education level: Not on file  Occupational History  . Not on file  Tobacco Use  . Smoking status: Current Every Day Smoker    Packs/day: 1.00    Types: Cigarettes  . Smokeless tobacco: Never Used  Vaping Use  . Vaping Use: Never used  Substance and Sexual Activity  . Alcohol use: No  . Drug use: No  . Sexual activity: Yes    Birth control/protection: Surgical  Other Topics Concern  . Not on file  Social History Narrative  . Not on file   Social Determinants of Health   Financial Resource Strain: Not on file  Food Insecurity: Not on file  Transportation Needs: Not on file  Physical Activity: Not on file  Stress: Not on file  Social Connections: Not on file  Intimate Partner Violence: Not on file     Review of Systems: General: negative for chills, fever, night sweats or weight changes.  Cardiovascular: negative for chest pain, dyspnea on exertion, edema, orthopnea, palpitations, paroxysmal nocturnal dyspnea or shortness of breath Dermatological: negative for rash Respiratory: negative for cough or wheezing Urologic: negative for hematuria Abdominal: negative for nausea, vomiting, diarrhea, bright red blood per rectum, melena, or hematemesis Neurologic: negative for visual changes, syncope, or dizziness All other systems reviewed and are otherwise negative except as noted above.    Blood pressure (!) 124/58, pulse 86, height 5\' 4"  (1.626 m), weight 185 lb (83.9 kg).  General appearance: alert and no distress Neck: no adenopathy, no carotid bruit, no JVD, supple, symmetrical, trachea midline and thyroid not enlarged, symmetric, no tenderness/mass/nodules Lungs: clear to auscultation bilaterally Heart: regular rate and rhythm, S1, S2 normal, no murmur, click, rub or gallop Extremities: extremities normal, atraumatic, no cyanosis or edema Pulses: 2+ and symmetric Skin: Skin color,  texture, turgor normal. No rashes or lesions Neurologic: Alert and oriented X 3, normal strength and tone. Normal symmetric reflexes. Normal coordination and gait  EKG not performed today  ASSESSMENT AND PLAN:   Claudication in peripheral vascular disease (HCC) History of PAD status post bilateral SFA intervention in October and November 2020 with follow-up Dopplers that showed marked improvement in symptoms with improvement in her Dopplers.  She walks a half an hour a day without limitation.  We will repeat lower extremity arterial Doppler studies now we will see her back as needed.      December 2020 MD FACP,FACC,FAHA, [facility] 02/14/2020 3:22 PM

## 2020-02-14 NOTE — Patient Instructions (Signed)
Medication Instructions:  Your physician recommends that you continue on your current medications as directed. Please refer to the Current Medication list given to you today.  *If you need a refill on your cardiac medications before your next appointment, please call your pharmacy*  Testing/Procedures: Your physician has requested that you have a lower extremity arterial duplex. This test is an ultrasound of the arteries in the legs. It looks at arterial blood flow in the legs. Allow one hour for Lower Arterial scans. There are no restrictions or special instructions. This is done at [address] [facility]. 2nd Floor  Your physician has requested that you have an ankle brachial index (ABI). During this test an ultrasound and blood pressure cuff are used to evaluate the arteries that supply the arms and legs with blood. Allow thirty minutes for this exam. There are no restrictions or special instructions. This is done at [address] [facility]. 2nd Floor   Follow-Up: At [facility], you and your health needs are our priority.  As part of our continuing mission to provide you with exceptional heart care, we have created designated Provider Care Teams.  These Care Teams include your primary Cardiologist (physician) and Advanced Practice Providers (APPs -  Physician Assistants and Nurse Practitioners) who all work together to provide you with the care you need, when you need it.  We recommend signing up for the patient portal called "MyChart".  Sign up information is provided on this After Visit Summary.  MyChart is used to connect with patients for Virtual Visits (Telemedicine).  Patients are able to view lab/test results, encounter notes, upcoming appointments, etc.  Non-urgent messages can be sent to your provider as well.   To learn more about what you can do with MyChart, go to [email or web address].    Your next appointment:   No future appointment made.  If you need to be seen please give our  office a call.  Provider:   [name], MD

## 2020-02-14 NOTE — Assessment & Plan Note (Signed)
History of PAD status post bilateral SFA intervention in October and November 2020 with follow-up Dopplers that showed marked improvement in symptoms with improvement in her Dopplers.  She walks a half an hour a day without limitation.  We will repeat lower extremity arterial Doppler studies now we will see her back as needed.

## 2020-02-14 NOTE — Addendum Note (Signed)
Addended by: Bernita Buffy on: 02/14/2020 03:32 PM   Modules accepted: Orders

## 2020-02-24 ENCOUNTER — Other Ambulatory Visit: Payer: Self-pay

## 2020-02-24 ENCOUNTER — Ambulatory Visit (HOSPITAL_COMMUNITY)
Admission: RE | Admit: 2020-02-24 | Discharge: 2020-02-24 | Disposition: A | Discharge status: Home / Self Care | Payer: Managed Care, Other (non HMO) | Source: Ambulatory Visit | Attending: Cardiology | Admitting: Cardiology

## 2020-02-24 DIAGNOSIS — I739 Peripheral vascular disease, unspecified: Secondary | ICD-10-CM | POA: Diagnosis not present

## 2020-03-03 ENCOUNTER — Telehealth: Payer: Self-pay

## 2020-03-03 NOTE — Telephone Encounter (Signed)
Spoke with pt. Pt states that leg symptoms are better than they used to be but occasionally has tired feeling in the legs. OV made for 03/11/20 at 9am. Pt verbalizes understanding.

## 2020-03-11 ENCOUNTER — Other Ambulatory Visit: Payer: Self-pay

## 2020-03-11 ENCOUNTER — Encounter: Payer: Self-pay | Admitting: Cardiovascular Disease

## 2020-03-11 ENCOUNTER — Ambulatory Visit (INDEPENDENT_AMBULATORY_CARE_PROVIDER_SITE_OTHER): Payer: Managed Care, Other (non HMO) | Admitting: Cardiovascular Disease

## 2020-03-11 VITALS — BP 169/73 | HR 81 | Ht 64.0 in | Wt 186.6 lb

## 2020-03-11 DIAGNOSIS — Z720 Tobacco use: Secondary | ICD-10-CM

## 2020-03-11 DIAGNOSIS — I4819 Other persistent atrial fibrillation: Secondary | ICD-10-CM

## 2020-03-11 DIAGNOSIS — I739 Peripheral vascular disease, unspecified: Secondary | ICD-10-CM

## 2020-03-11 DIAGNOSIS — I10 Essential (primary) hypertension: Secondary | ICD-10-CM

## 2020-03-11 DIAGNOSIS — E782 Mixed hyperlipidemia: Secondary | ICD-10-CM

## 2020-03-11 LAB — HEPATIC FUNCTION PANEL
ALT: 14 IU/L (ref 0–32)
AST: 14 IU/L (ref 0–40)
Albumin: 4.3 g/dL (ref 3.8–4.9)
Alkaline Phosphatase: 163 IU/L — ABNORMAL HIGH (ref 44–121)
Bilirubin Total: 0.3 mg/dL (ref 0.0–1.2)
Bilirubin, Direct: <0.1 mg/dL (ref 0.00–0.40)
Total Protein: 7 g/dL (ref 6.0–8.5)

## 2020-03-11 LAB — CBC
Hematocrit: 31.9 % — ABNORMAL LOW (ref 34.0–46.6)
Hemoglobin: 10.6 g/dL — ABNORMAL LOW (ref 11.1–15.9)
MCH: 28.6 pg (ref 26.6–33.0)
MCHC: 33.2 g/dL (ref 31.5–35.7)
MCV: 86 fL (ref 79–97)
Platelets: 292 10*3/uL (ref 150–450)
RBC: 3.71 x10E6/uL — ABNORMAL LOW (ref 3.77–5.28)
RDW: 14.3 % (ref 11.7–15.4)
WBC: 9.1 10*3/uL (ref 3.4–10.8)

## 2020-03-11 LAB — LIPID PANEL
Chol/HDL Ratio: 4.4 ratio (ref 0.0–4.4)
Cholesterol, Total: 118 mg/dL (ref 100–199)
HDL: 27 mg/dL — ABNORMAL LOW (ref >39)
LDL Chol Calc (NIH): 50 mg/dL (ref 0–99)
Triglycerides: 257 mg/dL — ABNORMAL HIGH (ref 0–149)
VLDL Cholesterol Cal: 41 mg/dL — ABNORMAL HIGH (ref 5–40)

## 2020-03-11 LAB — BASIC METABOLIC PANEL
BUN/Creatinine Ratio: 12 (ref 9–23)
BUN: 12 mg/dL (ref 6–24)
CO2: 24 mmol/L (ref 20–29)
Calcium: 9.3 mg/dL (ref 8.7–10.2)
Chloride: 102 mmol/L (ref 96–106)
Creatinine, Ser: 0.99 mg/dL (ref 0.57–1.00)
GFR calc Af Amer: 73 mL/min/{1.73_m2} (ref >59)
GFR calc non Af Amer: 63 mL/min/{1.73_m2} (ref >59)
Glucose: 164 mg/dL — ABNORMAL HIGH (ref 65–99)
Potassium: 4.3 mmol/L (ref 3.5–5.2)
Sodium: 139 mmol/L (ref 134–144)

## 2020-03-11 MED ORDER — SODIUM CHLORIDE 0.9% FLUSH
3.0000 mL | Freq: Two times a day (BID) | INTRAVENOUS | Status: AC
Start: 2020-03-11 — End: ?

## 2020-03-11 NOTE — H&P (View-Only) (Signed)
03/11/2020 Mary Campbell   1965/07/07  [id number]  Primary Physician Ralene Ok, MD Primary Cardiologist: Runell Gess MD [name], [place]  HPI:  Mary Campbell is a 55 y.o.   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 She was referred by Dr. Flora Lipps , her primary cardiologist, for peripheral vascular valuation because of lifestyle limiting claudication.I last saw her in the office1/28/2022.Her cardiac risk factors are notable for 40-50-pack-year tobacco abuse currently trying to stop on Chantix, treated hypertension, diabetes and hyperlipidemia as well as family history with a father who had multiple stents myocardial infarction's. She is never had a heart attack or stroke. She currently denies chest pain or shortness of breath although recently she did have chest pain and was evaluated by Dr. Carmon Ginsberg. She had a negative Myoview and a normal 2D echo. She is complained of claudication over the last year which is fairly symmetric. This is not bothering her during her workday but when walking longer distances, out shopping, she does have to stop because of cramping in her calves. Doppler studies performed 09/26/2018 revealed ABIs of about 0.7 bilaterally with an occluded above-the-knee popliteal on the right and high-frequency signal in the proximal left SFA.  I performed peripheral angiography on her 10/18/2018 revealing short segment CTO mid to distal right SFA with three-vessel runoff and 95% / 60% segmental proximal left SFA stenosis with three-vessel runoff. Performed directional atherectomy followed by drug-coated balloon angioplasty of the proximal left SFA with an excellent result. Her claudication resolved and her Dopplers improved with a left ABI 0.84. She wishes to proceed with right SFA intervention.  I performed right SFA directional arthrectomy followed by drug-coated balloon angioplasty on 11/22/2018 with excellent  angiographic result. Her Dopplers improved and her claudication has resolved. Of note, she did stop smoking on 11/20/2018. She no longer has pain in her legs at night and is able to ambulate without limitation.  Since I saw her in the office several weeks ago she has had Doppler studies performed 02/26/2020 that suggest restenosis of both SFAs.  She relates to me that she is moderately symptomatic and wishes to proceed with percutaneous intervention.  She is on Chantix and trying to stop smoking.  She denies chest pain or shortness of breath.    Current Meds  Medication Sig  . acetaminophen (TYLENOL) 500 MG tablet Take 1,000 mg by mouth every 6 (six) hours as needed for moderate pain or headache.  Marland Kitchen aspirin EC 81 MG tablet Take 81 mg by mouth daily.  . Azilsartan-Chlorthalidone (EDARBYCLOR) 40-25 MG TABS Take 1 tablet by mouth daily.  . diphenhydrAMINE (BENADRYL) 25 MG tablet Take 25 mg by mouth daily as needed for itching or allergies.  Marland Kitchen glimepiride (AMARYL) 4 MG tablet Take 2 mg by mouth daily with breakfast.   . Insulin Glargine, 1 Unit Dial, (TOUJEO SOLOSTAR) 300 UNIT/ML SOPN Inject 48 Units into the skin at bedtime.  . metFORMIN (GLUCOPHAGE) 1000 MG tablet Take 1,000 mg by mouth 2 (two) times daily.  . rosuvastatin (CRESTOR) 40 MG tablet Take 1 tablet (40 mg total) by mouth daily.  . varenicline (CHANTIX STARTING MONTH PAK) 0.5 MG X 11 & 1 MG X 42 tablet Take 0.5-1 mg by mouth See admin instructions. Take one 0.5 mg tablet by mouth once daily for 3 days, then increase to one 0.5 mg tablet twice daily for 4 days, then increase to one 1 mg tablet twice daily.  Allergies  Allergen Reactions  . Codeine Hives  . Lisinopril Cough  . Sulfonamide Derivatives Hives    Social History   Socioeconomic History  . Marital status: Legally Separated    Spouse name: Not on file  . Number of children: Not on file  . Years of education: Not on file  . Highest education level: Not on file   Occupational History  . Not on file  Tobacco Use  . Smoking status: Current Every Day Smoker    Packs/day: 1.00    Types: Cigarettes  . Smokeless tobacco: Never Used  Vaping Use  . Vaping Use: Never used  Substance and Sexual Activity  . Alcohol use: No  . Drug use: No  . Sexual activity: Yes    Birth control/protection: Surgical  Other Topics Concern  . Not on file  Social History Narrative  . Not on file   Social Determinants of Health   Financial Resource Strain: Not on file  Food Insecurity: Not on file  Transportation Needs: Not on file  Physical Activity: Not on file  Stress: Not on file  Social Connections: Not on file  Intimate Partner Violence: Not on file     Review of Systems: General: negative for chills, fever, night sweats or weight changes.  Cardiovascular: negative for chest pain, dyspnea on exertion, edema, orthopnea, palpitations, paroxysmal nocturnal dyspnea or shortness of breath Dermatological: negative for rash Respiratory: negative for cough or wheezing Urologic: negative for hematuria Abdominal: negative for nausea, vomiting, diarrhea, bright red blood per rectum, melena, or hematemesis Neurologic: negative for visual changes, syncope, or dizziness All other systems reviewed and are otherwise negative except as noted above.    Blood pressure (!) 169/73, pulse 81, height 5\' 4"  (1.626 m), weight 186 lb 9.6 oz (84.6 kg), SpO2 99 %.  General appearance: alert and no distress Neck: no adenopathy, no carotid bruit, no JVD, supple, symmetrical, trachea midline and thyroid not enlarged, symmetric, no tenderness/mass/nodules Lungs: clear to auscultation bilaterally Heart: regular rate and rhythm, S1, S2 normal, no murmur, click, rub or gallop Extremities: extremities normal, atraumatic, no cyanosis or edema Pulses: 2+ and symmetric Diminished pedal pulses bilaterally Skin: Skin color, texture, turgor normal. No rashes or lesions Neurologic: Alert  and oriented X 3, normal strength and tone. Normal symmetric reflexes. Normal coordination and gait  EKG sinus rhythm at 81 without ST or T wave changes.  There are septal Q waves.  I personally reviewed this EKG.  ASSESSMENT AND PLAN:   Claudication in peripheral vascular disease (HCC) History of PAD status post bilateral SFA intervention in October and November 2020 with directional atherectomy and drug-coated balloon angioplasty.  She enjoyed excellent clinical result after that with the absence of claudication until recently when she has had some recurrent claudication and Dopplers that suggest bilateral restenosis.  She did go back to smoking but is currently trying to stop if she wishes to proceed with reintervention which we will perform the next several weeks.  We will focus on the left leg initially and then come back and staged the right SFA.  Tobacco abuse Ongoing tobacco abuse on Chantix with the intent to stop  Essential hypertension History of essential hypertension blood pressure measured today 169/73.  She is experiencing a lot of discomfort in her hips knees and ankles probably orthopedic in nature.  She is on December 2020.  Hyperlipidemia History of hyperlipidemia on statin therapy with lipid profile performed 11/13/2018 revealing total cholesterol 112, LDL 56 and HDL 29.  We will recheck a lipid liver profile.      Runell Gess MD FACP,FACC,FAHA, [facility] 03/11/2020 9:28 AM

## 2020-03-11 NOTE — Patient Instructions (Addendum)
    [facility] CARDIOVASCULAR DIVISION [facility] [address] [place] [address] [place] [address] Dept: [phone] Loc: [phone]  Mary Campbell  03/11/2020  You are scheduled for a Peripheral Angiogram on Thursday, March 17 with Dr. [name].  1. Please arrive at the [facility] (Main Entrance A) at [facility]: [address] [place], [place] [address] at 5:30 AM (This time is two hours before your procedure to ensure your preparation). Free valet parking service is available.   Special note: Every effort is made to have your procedure done on time. Please understand that emergencies sometimes delay scheduled procedures.  2. Diet: Do not eat solid foods after midnight.  The patient may have clear liquids until 5am upon the day of the procedure.  3. Labs: You will need to have blood drawn today.  4. Medication instructions in preparation for your procedure:   Take only 24 units of insulin the night before your procedure. Do not take any insulin on the day of the procedure.  Do not take Diabetes Med Glucophage (Metformin) on the day of the procedure and HOLD 48 HOURS AFTER THE PROCEDURE.  On the morning of your procedure, take your Aspirin and any morning medicines NOT listed above.  You may use sips of water.  5. Plan for one night stay--bring personal belongings. 6. Bring a current list of your medications and current insurance cards. 7. You MUST have a responsible person to drive you home. 8. Someone MUST be with you the first 24 hours after you arrive home or your discharge will be delayed. 9. Please wear clothes that are easy to get on and off and wear slip-on shoes.  Thank you for allowing [place] to care for you!   -- [facility]  You will need a COVID-19  test prior to your procedure. You are scheduled for Tues. 03/31/20 at 8:30AM. This is a Drive Up Visit at [address]  [place], [place] [address]. Someone will direct you to the appropriate testing line. Stay in your car and someone will be with you shortly.

## 2020-03-11 NOTE — Progress Notes (Signed)
03/11/2020 Mary Campbell   1965/07/07  [id number]  Primary Physician Ralene Ok, MD Primary Cardiologist: Runell Gess MD [name], [place]  HPI:  Mary Campbell is a 55 y.o.   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 She was referred by Dr. Flora Lipps , her primary cardiologist, for peripheral vascular valuation because of lifestyle limiting claudication.I last saw her in the office1/28/2022.Her cardiac risk factors are notable for 40-50-pack-year tobacco abuse currently trying to stop on Chantix, treated hypertension, diabetes and hyperlipidemia as well as family history with a father who had multiple stents myocardial infarction's. She is never had a heart attack or stroke. She currently denies chest pain or shortness of breath although recently she did have chest pain and was evaluated by Dr. Carmon Ginsberg. She had a negative Myoview and a normal 2D echo. She is complained of claudication over the last year which is fairly symmetric. This is not bothering her during her workday but when walking longer distances, out shopping, she does have to stop because of cramping in her calves. Doppler studies performed 09/26/2018 revealed ABIs of about 0.7 bilaterally with an occluded above-the-knee popliteal on the right and high-frequency signal in the proximal left SFA.  I performed peripheral angiography on her 10/18/2018 revealing short segment CTO mid to distal right SFA with three-vessel runoff and 95% / 60% segmental proximal left SFA stenosis with three-vessel runoff. Performed directional atherectomy followed by drug-coated balloon angioplasty of the proximal left SFA with an excellent result. Her claudication resolved and her Dopplers improved with a left ABI 0.84. She wishes to proceed with right SFA intervention.  I performed right SFA directional arthrectomy followed by drug-coated balloon angioplasty on 11/22/2018 with excellent  angiographic result. Her Dopplers improved and her claudication has resolved. Of note, she did stop smoking on 11/20/2018. She no longer has pain in her legs at night and is able to ambulate without limitation.  Since I saw her in the office several weeks ago she has had Doppler studies performed 02/26/2020 that suggest restenosis of both SFAs.  She relates to me that she is moderately symptomatic and wishes to proceed with percutaneous intervention.  She is on Chantix and trying to stop smoking.  She denies chest pain or shortness of breath.    Current Meds  Medication Sig  . acetaminophen (TYLENOL) 500 MG tablet Take 1,000 mg by mouth every 6 (six) hours as needed for moderate pain or headache.  Marland Kitchen aspirin EC 81 MG tablet Take 81 mg by mouth daily.  . Azilsartan-Chlorthalidone (EDARBYCLOR) 40-25 MG TABS Take 1 tablet by mouth daily.  . diphenhydrAMINE (BENADRYL) 25 MG tablet Take 25 mg by mouth daily as needed for itching or allergies.  Marland Kitchen glimepiride (AMARYL) 4 MG tablet Take 2 mg by mouth daily with breakfast.   . Insulin Glargine, 1 Unit Dial, (TOUJEO SOLOSTAR) 300 UNIT/ML SOPN Inject 48 Units into the skin at bedtime.  . metFORMIN (GLUCOPHAGE) 1000 MG tablet Take 1,000 mg by mouth 2 (two) times daily.  . rosuvastatin (CRESTOR) 40 MG tablet Take 1 tablet (40 mg total) by mouth daily.  . varenicline (CHANTIX STARTING MONTH PAK) 0.5 MG X 11 & 1 MG X 42 tablet Take 0.5-1 mg by mouth See admin instructions. Take one 0.5 mg tablet by mouth once daily for 3 days, then increase to one 0.5 mg tablet twice daily for 4 days, then increase to one 1 mg tablet twice daily.  Allergies  Allergen Reactions  . Codeine Hives  . Lisinopril Cough  . Sulfonamide Derivatives Hives    Social History   Socioeconomic History  . Marital status: Legally Separated    Spouse name: Not on file  . Number of children: Not on file  . Years of education: Not on file  . Highest education level: Not on file   Occupational History  . Not on file  Tobacco Use  . Smoking status: Current Every Day Smoker    Packs/day: 1.00    Types: Cigarettes  . Smokeless tobacco: Never Used  Vaping Use  . Vaping Use: Never used  Substance and Sexual Activity  . Alcohol use: No  . Drug use: No  . Sexual activity: Yes    Birth control/protection: Surgical  Other Topics Concern  . Not on file  Social History Narrative  . Not on file   Social Determinants of Health   Financial Resource Strain: Not on file  Food Insecurity: Not on file  Transportation Needs: Not on file  Physical Activity: Not on file  Stress: Not on file  Social Connections: Not on file  Intimate Partner Violence: Not on file     Review of Systems: General: negative for chills, fever, night sweats or weight changes.  Cardiovascular: negative for chest pain, dyspnea on exertion, edema, orthopnea, palpitations, paroxysmal nocturnal dyspnea or shortness of breath Dermatological: negative for rash Respiratory: negative for cough or wheezing Urologic: negative for hematuria Abdominal: negative for nausea, vomiting, diarrhea, bright red blood per rectum, melena, or hematemesis Neurologic: negative for visual changes, syncope, or dizziness All other systems reviewed and are otherwise negative except as noted above.    Blood pressure (!) 169/73, pulse 81, height 5' 4" (1.626 m), weight 186 lb 9.6 oz (84.6 kg), SpO2 99 %.  General appearance: alert and no distress Neck: no adenopathy, no carotid bruit, no JVD, supple, symmetrical, trachea midline and thyroid not enlarged, symmetric, no tenderness/mass/nodules Lungs: clear to auscultation bilaterally Heart: regular rate and rhythm, S1, S2 normal, no murmur, click, rub or gallop Extremities: extremities normal, atraumatic, no cyanosis or edema Pulses: 2+ and symmetric Diminished pedal pulses bilaterally Skin: Skin color, texture, turgor normal. No rashes or lesions Neurologic: Alert  and oriented X 3, normal strength and tone. Normal symmetric reflexes. Normal coordination and gait  EKG sinus rhythm at 81 without ST or T wave changes.  There are septal Q waves.  I personally reviewed this EKG.  ASSESSMENT AND PLAN:   Claudication in peripheral vascular disease (HCC) History of PAD status post bilateral SFA intervention in October and November 2020 with directional atherectomy and drug-coated balloon angioplasty.  She enjoyed excellent clinical result after that with the absence of claudication until recently when she has had some recurrent claudication and Dopplers that suggest bilateral restenosis.  She did go back to smoking but is currently trying to stop if she wishes to proceed with reintervention which we will perform the next several weeks.  We will focus on the left leg initially and then come back and staged the right SFA.  Tobacco abuse Ongoing tobacco abuse on Chantix with the intent to stop  Essential hypertension History of essential hypertension blood pressure measured today 169/73.  She is experiencing a lot of discomfort in her hips knees and ankles probably orthopedic in nature.  She is on Edarbychlor.  Hyperlipidemia History of hyperlipidemia on statin therapy with lipid profile performed 11/13/2018 revealing total cholesterol 112, LDL 56 and HDL 29.    We will recheck a lipid liver profile.      Runell Gess MD FACP,FACC,FAHA, [facility] 03/11/2020 9:28 AM

## 2020-03-11 NOTE — Assessment & Plan Note (Signed)
History of hyperlipidemia on statin therapy with lipid profile performed 11/13/2018 revealing total cholesterol 112, LDL 56 and HDL 29.  We will recheck a lipid liver profile.

## 2020-03-11 NOTE — Assessment & Plan Note (Signed)
History of PAD status post bilateral SFA intervention in October and November 2020 with directional atherectomy and drug-coated balloon angioplasty.  She enjoyed excellent clinical result after that with the absence of claudication until recently when she has had some recurrent claudication and Dopplers that suggest bilateral restenosis.  She did go back to smoking but is currently trying to stop if she wishes to proceed with reintervention which we will perform the next several weeks.  We will focus on the left leg initially and then come back and staged the right SFA.

## 2020-03-11 NOTE — Assessment & Plan Note (Signed)
History of essential hypertension blood pressure measured today 169/73.  She is experiencing a lot of discomfort in her hips knees and ankles probably orthopedic in nature.  She is on [employer].

## 2020-03-11 NOTE — Assessment & Plan Note (Signed)
Ongoing tobacco abuse on Chantix with the intent to stop

## 2020-03-31 ENCOUNTER — Telehealth: Payer: Self-pay | Admitting: *Deleted

## 2020-03-31 ENCOUNTER — Other Ambulatory Visit (HOSPITAL_COMMUNITY)
Admission: RE | Admit: 2020-03-31 | Discharge: 2020-03-31 | Disposition: A | Discharge status: Home / Self Care | Payer: Managed Care, Other (non HMO) | Source: Ambulatory Visit | Attending: Cardiovascular Disease | Admitting: Cardiovascular Disease

## 2020-03-31 DIAGNOSIS — Z01812 Encounter for preprocedural laboratory examination: Secondary | ICD-10-CM | POA: Diagnosis present

## 2020-03-31 DIAGNOSIS — Z20822 Contact with and (suspected) exposure to covid-19: Secondary | ICD-10-CM | POA: Diagnosis not present

## 2020-03-31 LAB — SARS CORONAVIRUS 2 (TAT 6-24 HRS): SARS Coronavirus 2: NEGATIVE

## 2020-03-31 NOTE — Telephone Encounter (Signed)
Pt contacted pre-abdominal aortogram scheduled at [facility] for: Thursday April 02, 2020 7:30 AM Verified arrival time and place: [facility] Main Entrance A [facility] at: 5:30 AM   No solid food after midnight prior to cath, clear liquids until 5 AM day of procedure.  Hold:  Metformin-day of procedure and 48 hours post procedure Glimepiride- AM of procedure Insulin-AM of procedure/1/2 usual [employer] dose HS prior to procedure Azilsartan-chlorthalidone-AM of procedure  AM meds can be  taken pre-cath with sips of water including: ASA 81 mg   Confirmed patient has responsible adult to drive home post procedure and be with patient first 24 hours after arriving home: yes  You are allowed ONE visitor in the waiting room during the time you are at the hospital for your procedure. Both you and your visitor must wear a mask once you enter the hospital.   Reviewed procedure/mask/visitor instructions with patient.

## 2020-04-02 ENCOUNTER — Encounter (HOSPITAL_COMMUNITY): Payer: Self-pay | Admitting: Cardiovascular Disease

## 2020-04-02 ENCOUNTER — Ambulatory Visit (HOSPITAL_COMMUNITY)
Admission: RE | Disposition: A | Discharge status: Home / Self Care | Payer: Managed Care, Other (non HMO) | Source: Home / Self Care | Attending: Cardiovascular Disease

## 2020-04-02 ENCOUNTER — Other Ambulatory Visit: Payer: Self-pay

## 2020-04-02 ENCOUNTER — Ambulatory Visit (HOSPITAL_COMMUNITY)
Admission: RE | Admit: 2020-04-02 | Discharge: 2020-04-02 | Disposition: A | Discharge status: Home / Self Care | Payer: Managed Care, Other (non HMO) | Attending: Cardiovascular Disease | Admitting: Cardiovascular Disease

## 2020-04-02 DIAGNOSIS — E785 Hyperlipidemia, unspecified: Secondary | ICD-10-CM | POA: Diagnosis not present

## 2020-04-02 DIAGNOSIS — I10 Essential (primary) hypertension: Secondary | ICD-10-CM | POA: Diagnosis not present

## 2020-04-02 DIAGNOSIS — Z794 Long term (current) use of insulin: Secondary | ICD-10-CM | POA: Insufficient documentation

## 2020-04-02 DIAGNOSIS — Z7984 Long term (current) use of oral hypoglycemic drugs: Secondary | ICD-10-CM | POA: Insufficient documentation

## 2020-04-02 DIAGNOSIS — F17210 Nicotine dependence, cigarettes, uncomplicated: Secondary | ICD-10-CM | POA: Diagnosis not present

## 2020-04-02 DIAGNOSIS — Z79899 Other long term (current) drug therapy: Secondary | ICD-10-CM | POA: Diagnosis not present

## 2020-04-02 DIAGNOSIS — Z7982 Long term (current) use of aspirin: Secondary | ICD-10-CM | POA: Diagnosis not present

## 2020-04-02 DIAGNOSIS — Z882 Allergy status to sulfonamides status: Secondary | ICD-10-CM | POA: Insufficient documentation

## 2020-04-02 DIAGNOSIS — E1151 Type 2 diabetes mellitus with diabetic peripheral angiopathy without gangrene: Secondary | ICD-10-CM | POA: Insufficient documentation

## 2020-04-02 DIAGNOSIS — Z885 Allergy status to narcotic agent status: Secondary | ICD-10-CM | POA: Insufficient documentation

## 2020-04-02 DIAGNOSIS — Z888 Allergy status to other drugs, medicaments and biological substances status: Secondary | ICD-10-CM | POA: Insufficient documentation

## 2020-04-02 DIAGNOSIS — I739 Peripheral vascular disease, unspecified: Secondary | ICD-10-CM | POA: Diagnosis present

## 2020-04-02 DIAGNOSIS — I70212 Atherosclerosis of native arteries of extremities with intermittent claudication, left leg: Secondary | ICD-10-CM

## 2020-04-02 HISTORY — PX: ABDOMINAL AORTOGRAM W/LOWER EXTREMITY: CATH118223

## 2020-04-02 HISTORY — PX: PERIPHERAL VASCULAR INTERVENTION: CATH118257

## 2020-04-02 LAB — GLUCOSE, CAPILLARY
Glucose-Capillary: 231 mg/dL — ABNORMAL HIGH (ref 70–99)
Glucose-Capillary: 285 mg/dL — ABNORMAL HIGH (ref 70–99)

## 2020-04-02 LAB — POCT ACTIVATED CLOTTING TIME
Activated Clotting Time: 291 seconds
Activated Clotting Time: 243 seconds

## 2020-04-02 SURGERY — ABDOMINAL AORTOGRAM W/LOWER EXTREMITY
Anesthesia: LOCAL

## 2020-04-02 MED ORDER — CLOPIDOGREL BISULFATE 75 MG PO TABS
ORAL_TABLET | ORAL | Status: AC
  Filled 2020-04-02: qty 1

## 2020-04-02 MED ORDER — SODIUM CHLORIDE 0.9 % IV SOLN: 250.0000 mL | INTRAVENOUS | Status: DC | PRN

## 2020-04-02 MED ORDER — HEPARIN (PORCINE) IN NACL 1000-0.9 UT/500ML-% IV SOLN
INTRAVENOUS | Status: AC
  Filled 2020-04-02: qty 1500

## 2020-04-02 MED ORDER — SODIUM CHLORIDE 0.9 % WEIGHT BASED INFUSION
3.0000 mL/kg/h | INTRAVENOUS | Status: AC
  Administered 2020-04-02: 3 mL/kg/h via INTRAVENOUS

## 2020-04-02 MED ORDER — ASPIRIN EC 81 MG PO TBEC: 81.0000 mg | DELAYED_RELEASE_TABLET | Freq: Every day | ORAL | Status: DC

## 2020-04-02 MED ORDER — HEPARIN SODIUM (PORCINE) 1000 UNIT/ML IJ SOLN
INTRAMUSCULAR | Status: AC
  Filled 2020-04-02: qty 1

## 2020-04-02 MED ORDER — MIDAZOLAM HCL 2 MG/2ML IJ SOLN
INTRAMUSCULAR | Status: AC
  Filled 2020-04-02: qty 2

## 2020-04-02 MED ORDER — ACETAMINOPHEN 325 MG PO TABS: 650.0000 mg | ORAL_TABLET | ORAL | Status: DC | PRN

## 2020-04-02 MED ORDER — SODIUM CHLORIDE 0.9 % IV SOLN: INTRAVENOUS | Status: DC

## 2020-04-02 MED ORDER — SODIUM CHLORIDE 0.9 % WEIGHT BASED INFUSION: 1.0000 mL/kg/h | INTRAVENOUS | Status: DC

## 2020-04-02 MED ORDER — FENTANYL CITRATE (PF) 100 MCG/2ML IJ SOLN
INTRAMUSCULAR | Status: AC
  Filled 2020-04-02: qty 2

## 2020-04-02 MED ORDER — ONDANSETRON HCL 4 MG/2ML IJ SOLN: 4.0000 mg | Freq: Four times a day (QID) | INTRAMUSCULAR | Status: DC | PRN

## 2020-04-02 MED ORDER — ASPIRIN 81 MG PO CHEW: 81.0000 mg | CHEWABLE_TABLET | ORAL | Status: DC

## 2020-04-02 MED ORDER — CLOPIDOGREL BISULFATE 75 MG PO TABS: 75.0000 mg | ORAL_TABLET | Freq: Every day | ORAL | 11 refills | Status: AC

## 2020-04-02 MED ORDER — CLOPIDOGREL BISULFATE 75 MG PO TABS: 75.0000 mg | ORAL_TABLET | Freq: Every day | ORAL | Status: DC

## 2020-04-02 MED ORDER — LABETALOL HCL 5 MG/ML IV SOLN: 10.0000 mg | INTRAVENOUS | Status: DC | PRN

## 2020-04-02 MED ORDER — SODIUM CHLORIDE 0.9% FLUSH: 3.0000 mL | INTRAVENOUS | Status: DC | PRN

## 2020-04-02 MED ORDER — HYDRALAZINE HCL 20 MG/ML IJ SOLN
INTRAMUSCULAR | Status: AC
  Filled 2020-04-02: qty 1

## 2020-04-02 MED ORDER — HEPARIN SODIUM (PORCINE) 1000 UNIT/ML IJ SOLN
INTRAMUSCULAR | Status: DC | PRN
  Administered 2020-04-02: 9000 [IU] via INTRAVENOUS

## 2020-04-02 MED ORDER — HYDRALAZINE HCL 20 MG/ML IJ SOLN: 5.0000 mg | INTRAMUSCULAR | Status: DC | PRN

## 2020-04-02 MED ORDER — LIDOCAINE HCL (PF) 1 % IJ SOLN
INTRAMUSCULAR | Status: AC
  Filled 2020-04-02: qty 30

## 2020-04-02 MED ORDER — CLOPIDOGREL BISULFATE 300 MG PO TABS
ORAL_TABLET | ORAL | Status: DC | PRN
  Administered 2020-04-02: 300 mg via ORAL

## 2020-04-02 MED ORDER — FENTANYL CITRATE (PF) 100 MCG/2ML IJ SOLN
INTRAMUSCULAR | Status: DC | PRN
  Administered 2020-04-02 (×2): 25 ug via INTRAVENOUS

## 2020-04-02 MED ORDER — HEPARIN (PORCINE) IN NACL 1000-0.9 UT/500ML-% IV SOLN
INTRAVENOUS | Status: DC | PRN
  Administered 2020-04-02 (×2): 500 mL

## 2020-04-02 MED ORDER — LIDOCAINE HCL (PF) 1 % IJ SOLN
INTRAMUSCULAR | Status: DC | PRN
  Administered 2020-04-02: 25 mL via INTRADERMAL

## 2020-04-02 MED ORDER — MIDAZOLAM HCL 2 MG/2ML IJ SOLN
INTRAMUSCULAR | Status: DC | PRN
  Administered 2020-04-02 (×2): 1 mg via INTRAVENOUS

## 2020-04-02 MED ORDER — CLOPIDOGREL BISULFATE 300 MG PO TABS
ORAL_TABLET | ORAL | Status: AC
  Filled 2020-04-02: qty 1

## 2020-04-02 MED ORDER — SODIUM CHLORIDE 0.9% FLUSH: 3.0000 mL | Freq: Two times a day (BID) | INTRAVENOUS | Status: DC

## 2020-04-02 SURGICAL SUPPLY — 28 items
BALLN ADMIRAL INPACT 5X200 (BALLOONS) ×3
BALLN COYOTE OTW 2X100X150 (BALLOONS) ×3
BALLN STERLING OTW 2X100X150 (BALLOONS) ×3
BALLOON ADMIRAL INPACT 5X200 (BALLOONS) IMPLANT
BALLOON COYOTE OTW 2X100X150 (BALLOONS) IMPLANT
BALLOON STERLING OTW 2X100X150 (BALLOONS) IMPLANT
CATH ANGIO 5F PIGTAIL 65CM (CATHETERS) ×1 IMPLANT
CATH CROSS OVER TEMPO 5F (CATHETERS) ×1 IMPLANT
CATH HAWKONE LX EXTENDED TIP (CATHETERS) ×1 IMPLANT
CATH STRAIGHT 5FR 65CM (CATHETERS) ×1 IMPLANT
CLOSURE PERCLOSE PROSTYLE (VASCULAR PRODUCTS) ×1 IMPLANT
DEVICE SPIDERFX EMB PROT 6MM (WIRE) ×1 IMPLANT
GUIDEWIRE ZILIENT 6G 018 (WIRE) ×1 IMPLANT
KIT ENCORE 26 ADVANTAGE (KITS) ×1 IMPLANT
KIT PV (KITS) ×3 IMPLANT
SHEATH HIGHFLEX ANSEL 7FR 55CM (SHEATH) ×1 IMPLANT
SHEATH PINNACLE 5F 10CM (SHEATH) ×1 IMPLANT
SHEATH PINNACLE 7F 10CM (SHEATH) ×1 IMPLANT
SHEATH PROBE COVER 6X72 (BAG) ×1 IMPLANT
STOPCOCK MORSE 400PSI 3WAY (MISCELLANEOUS) ×1 IMPLANT
SYR MEDRAD MARK 7 150ML (SYRINGE) ×3 IMPLANT
TAPE VIPERTRACK RADIOPAQ (MISCELLANEOUS) IMPLANT
TAPE VIPERTRACK RADIOPAQUE (MISCELLANEOUS) ×3
TRANSDUCER W/STOPCOCK (MISCELLANEOUS) ×3 IMPLANT
TRAY PV CATH (CUSTOM PROCEDURE TRAY) ×3 IMPLANT
TUBING CIL FLEX 10 FLL-RA (TUBING) ×1 IMPLANT
WIRE HITORQ VERSACORE ST 145CM (WIRE) ×1 IMPLANT
WIRE ROSEN-J .035X180CM (WIRE) ×1 IMPLANT

## 2020-04-02 NOTE — Interval H&P Note (Signed)
History and Physical Interval Note:  04/02/2020 7:34 AM  Mary Campbell  has presented today for surgery, with the diagnosis of claudication.  The various methods of treatment have been discussed with the patient and family. After consideration of risks, benefits and other options for treatment, the patient has consented to  Procedure(s): ABDOMINAL AORTOGRAM W/LOWER EXTREMITY (N/A) as a surgical intervention.  The patient's history has been reviewed, patient examined, no change in status, stable for surgery.  I have reviewed the patient's chart and labs.  Questions were answered to the patient's satisfaction.     [name]

## 2020-04-02 NOTE — Discharge Instructions (Signed)
Angiogram, Care After HOLD METFORMIN/ RESTART Sunday 3/20 This sheet gives you information about how to care for yourself after your procedure. Your health care provider may also give you more specific instructions. If you have problems or questions, contact your health care provider. What can I expect after the procedure? After the procedure, it is common to have:  Bruising and tenderness at the catheter insertion area.  A collection of blood (hematoma) at the insertion area. This may feel like a small lump under the skin at the insertion site. Follow these instructions at home: Insertion site care  Follow instructions from your health care provider about how to take care of your insertion site. Make sure you: ? Wash your hands with soap and water before and after you change your bandage (dressing). If soap and water are not available, use hand sanitizer. ? Change your dressing as told by your health care provider.  Do not take baths, swim, or use a hot tub until your health care provider approves.  You may shower 24-48 hours after the procedure, or as told by your health care provider. To clean the insertion site: ? Gently wash the area with plain soap and water. ? Pat the area dry with a clean towel. ? Do not rub the site. This may cause bleeding.  Check your insertion site every day for signs of infection. Check for: ? Redness, swelling, or pain. ? Fluid or blood. ? Warmth. ? Pus or a bad smell.  Do not apply powder or lotion to the site. Keep the site clean and dry.   Activity  Do not drive for 24 hours if you were given a sedative during your procedure.  Rest as told by your health care provider, usually for 1-2 days.  Do not lift anything that is heavier than 10 lb (4.5 kg), or the limit that you are told, until your health care provider says that it is safe.  If the insertion site was in your leg, try to avoid stairs for a few days.  Return to your normal activities as  told by your health care provider, usually in about a week. Ask your health care provider what activities are safe for you. General instructions  If your insertion site starts bleeding, lie flat and put pressure on the site. If the bleeding does not stop, get help right away. This is a medical emergency.  Take over-the-counter and prescription medicines only as told by your health care provider.  Drink enough fluid to keep your urine pale yellow. This helps flush the contrast dye from your body.  Keep all follow-up visits as told by your health care provider. This is important.   Contact a health care provider if:  You have a fever or chills.  You have redness, swelling, or pain around your insertion site.  You have fluid or blood coming from your insertion site.  Your insertion site feels warm to the touch.  You have pus or a bad smell coming from your insertion site.  You have more bruising around the insertion site. Get help right away if you have:  A problem with the insertion area, such as: ? The area swells fast or bleeds even after you apply pressure. ? The area becomes pale, cool, tingly, or numb.  Chest pain.  Trouble breathing.  A rash.  Any symptoms of a stroke. "BE FAST" is an easy way to remember the main warning signs of a stroke: ? B - Balance. Signs  are dizziness, sudden trouble walking, or loss of balance. ? E - Eyes. Signs are trouble seeing or a sudden change in vision. ? F - Face. Signs are sudden weakness or loss of feeling of the face, or the face or eyelid drooping on one side. ? A - Arms. Signs are weakness or loss of feeling in an arm. This happens suddenly and usually on one side of the body. ? S - Speech. Signs are sudden trouble speaking, slurred speech, or trouble understanding what people say. ? T - Time. Time to call emergency services. Write down what time symptoms started.  You have other signs of a stroke, such as: ? A sudden, severe  headache with no known cause. ? Nausea or vomiting. ? Seizure. These symptoms may represent a serious problem that is an emergency. Do not wait to see if the symptoms will go away. Get medical help right away. Call your local emergency services (911 in the [place] Do not drive yourself to the hospital. Summary  It is common to have bruising and tenderness at the catheter insertion area.  Do not take baths, swim, or use a hot tub until your health care provider approves. You may shower 24-48 hours after the procedure or as told.  It is important to rest and drink plenty of fluids.  If the insertion site bleeds, lie flat and put pressure on the site. If the bleeding continues, get help right away. This is a medical emergency. This information is not intended to replace advice given to you by your health care provider. Make sure you discuss any questions you have with your health care provider. Document Revised: 11/07/2018 Document Reviewed: 11/07/2018 Elsevier Patient Education  2021 [employer].

## 2020-04-03 MED FILL — Hydralazine HCl Inj 20 MG/ML: INTRAMUSCULAR | Qty: 1 | Status: AC

## 2020-04-03 MED FILL — Heparin Sod (Porcine)-NaCl IV Soln 1000 Unit/500ML-0.9%: INTRAVENOUS | Qty: 500 | Status: AC

## 2020-04-08 ENCOUNTER — Telehealth: Payer: Self-pay | Admitting: Cardiovascular Disease

## 2020-04-08 NOTE — Telephone Encounter (Signed)
[facility] received paperwork from [facility] on 3/23, paperwork was given to [place], for provider to complete.

## 2020-04-15 ENCOUNTER — Other Ambulatory Visit: Payer: Self-pay

## 2020-04-15 ENCOUNTER — Ambulatory Visit (INDEPENDENT_AMBULATORY_CARE_PROVIDER_SITE_OTHER): Payer: Managed Care, Other (non HMO) | Admitting: Cardiovascular Disease

## 2020-04-15 ENCOUNTER — Encounter: Payer: Self-pay | Admitting: Cardiovascular Disease

## 2020-04-15 DIAGNOSIS — Z720 Tobacco use: Secondary | ICD-10-CM | POA: Diagnosis not present

## 2020-04-15 DIAGNOSIS — I739 Peripheral vascular disease, unspecified: Secondary | ICD-10-CM

## 2020-04-15 MED ORDER — SODIUM CHLORIDE 0.9% FLUSH: 3.0000 mL | Freq: Two times a day (BID) | INTRAVENOUS | Status: AC

## 2020-04-15 NOTE — Assessment & Plan Note (Signed)
History of peripheral arterial disease with lifestyle limiting claudication status post peripheral angiography 04/02/2020 revealing high-grade segmental left SFA stenosis with three-vessel runoff status post directional atherectomy and drug-coated balloon angioplasty with excellent clinical on an angiographic result.  Her claudication on the left has resolved and her foot is warm.  She does have a CTO on the right side with three-vessel runoff and wishes to proceed with staged right SFA intervention.

## 2020-04-15 NOTE — Addendum Note (Signed)
Addended by: Bernita Buffy on: 04/15/2020 03:21 PM   Modules accepted: Orders

## 2020-04-15 NOTE — Assessment & Plan Note (Signed)
Discontinue tobacco abuse on 03/30/2020 

## 2020-04-15 NOTE — H&P (View-Only) (Signed)
04/15/2020 [name]   Oct 20, 1965  [id number]  Primary Physician Ralene Ok, MD Primary Cardiologist: Runell Gess MD [name], [place]  HPI:  Mary Campbell is a 55 y.o.  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 She was referred by Dr.O'Neal, her primary cardiologist, for peripheral vascular valuation because of lifestyle limiting claudication.I last saw her in the office 03/11/2020.Her cardiac risk factors are notable for 40-50-pack-year tobacco abuse currently trying to stop on Chantix, treated hypertension, diabetes and hyperlipidemia as well as family history with a father who had multiple stents myocardial infarction's. She is never had a heart attack or stroke. She currently denies chest pain or shortness of breath although recently she did have chest pain and was evaluated by Dr. Carmon Ginsberg. She had a negative Myoview and a normal 2D echo. She is complained of claudication over the last year which is fairly symmetric. This is not bothering her during her workday but when walking longer distances, out shopping, she does have to stop because of cramping in her calves. Doppler studies performed 09/26/2018 revealed ABIs of about 0.7 bilaterally with an occluded above-the-knee popliteal on the right and high-frequency signal in the proximal left SFA.  I performed peripheral angiography on her 10/18/2018 revealing short segment CTO mid to distal right SFA with three-vessel runoff and 95% / 60% segmental proximal left SFA stenosis with three-vessel runoff. Performed directional atherectomy followed by drug-coated balloon angioplasty of the proximal left SFA with an excellent result. Her claudication resolved and her Dopplers improved with a left ABI 0.84. She wishes to proceed with right SFA intervention.  I performed right SFA directional arthrectomy followed by drug-coated balloon angioplasty on 11/22/2018 with excellent  angiographic result. Her Dopplers improved and her claudication has resolved. Of note, she did stop smoking on 11/20/2018. She no longer has pain in her legs at night and is able to ambulate without limitation.  Doppler studies performed 02/26/2020 that suggest restenosis of both SFAs.  She relates to me that she is moderately symptomatic and wishes to proceed with percutaneous intervention.  She is on Chantix and successfully stopped smoking on 03/30/2020.  I performed peripheral angiography on her 04/02/2020 revealing high-grade segmental mid left SFA stenosis with three-vessel runoff.  I performed directional atherectomy followed by drug-coated balloon angioplasty with an excellent angiographic and clinical result.  Claudication on that side resolved and her foot is now warm.  She has a moderately long right SFA CTO which she wishes to have revascularized.    Current Meds  Medication Sig  . acetaminophen (TYLENOL) 650 MG CR tablet Take 1,300 mg by mouth every 8 (eight) hours as needed for pain.  Marland Kitchen aspirin EC 81 MG tablet Take 81 mg by mouth daily.  . Azilsartan-Chlorthalidone (EDARBYCLOR) 40-12.5 MG TABS Take 1 tablet by mouth daily.  . clopidogrel (PLAVIX) 75 MG tablet Take 1 tablet (75 mg total) by mouth daily.  Marland Kitchen glimepiride (AMARYL) 2 MG tablet Take 2 mg by mouth daily.  Marland Kitchen ibuprofen (ADVIL) 200 MG tablet Take 400 mg by mouth every 6 (six) hours as needed for moderate pain or headache.  . insulin degludec (TRESIBA FLEXTOUCH) 100 UNIT/ML FlexTouch Pen Inject 48 Units into the skin at bedtime.  . Melatonin 10 MG CAPS Take 10 mg by mouth at bedtime as needed (sleep).  . metFORMIN (GLUCOPHAGE) 1000 MG tablet Take 1,000 mg by mouth 2 (two) times daily with a meal.  . nicotine polacrilex (  COMMIT) 4 MG lozenge Take 4 mg by mouth as needed for smoking cessation.  Marland Kitchen Phenylephrine-Acetaminophen 5-325 MG TABS Take 2 tablets by mouth daily as needed (allergy / sinus headaches).  . rosuvastatin  (CRESTOR) 40 MG tablet Take 1 tablet (40 mg total) by mouth daily.   Current Facility-Administered Medications for the 04/15/20 encounter (Office Visit) with Runell Gess, MD  Medication  . sodium chloride flush (NS) 0.9 % injection 3 mL     Allergies  Allergen Reactions  . Codeine Hives  . Lisinopril Cough  . Sulfonamide Derivatives Hives    Social History   Socioeconomic History  . Marital status: Legally Separated    Spouse name: Not on file  . Number of children: Not on file  . Years of education: Not on file  . Highest education level: Not on file  Occupational History  . Not on file  Tobacco Use  . Smoking status: Current Every Day Smoker    Packs/day: 1.00    Types: Cigarettes  . Smokeless tobacco: Never Used  Vaping Use  . Vaping Use: Never used  Substance and Sexual Activity  . Alcohol use: No  . Drug use: No  . Sexual activity: Yes    Birth control/protection: Surgical  Other Topics Concern  . Not on file  Social History Narrative  . Not on file   Social Determinants of Health   Financial Resource Strain: Not on file  Food Insecurity: Not on file  Transportation Needs: Not on file  Physical Activity: Not on file  Stress: Not on file  Social Connections: Not on file  Intimate Partner Violence: Not on file     Review of Systems: General: negative for chills, fever, night sweats or weight changes.  Cardiovascular: negative for chest pain, dyspnea on exertion, edema, orthopnea, palpitations, paroxysmal nocturnal dyspnea or shortness of breath Dermatological: negative for rash Respiratory: negative for cough or wheezing Urologic: negative for hematuria Abdominal: negative for nausea, vomiting, diarrhea, bright red blood per rectum, melena, or hematemesis Neurologic: negative for visual changes, syncope, or dizziness All other systems reviewed and are otherwise negative except as noted above.    Blood pressure (!) 132/52, pulse 85, height 5\' 4"   (1.626 m), weight 184 lb (83.5 kg).  General appearance: alert and no distress Neck: no adenopathy, no carotid bruit, no JVD, supple, symmetrical, trachea midline and thyroid not enlarged, symmetric, no tenderness/mass/nodules Lungs: clear to auscultation bilaterally Heart: regular rate and rhythm, S1, S2 normal, no murmur, click, rub or gallop Extremities: extremities normal, atraumatic, no cyanosis or edema Pulses: Absent right pedal pulse Skin: Skin color, texture, turgor normal. No rashes or lesions Neurologic: Alert and oriented X 3, normal strength and tone. Normal symmetric reflexes. Normal coordination and gait  EKG not performed today  ASSESSMENT AND PLAN:   Tobacco abuse Discontinue tobacco abuse on 03/30/2020  Claudication in peripheral vascular disease (HCC) History of peripheral arterial disease with lifestyle limiting claudication status post peripheral angiography 04/02/2020 revealing high-grade segmental left SFA stenosis with three-vessel runoff status post directional atherectomy and drug-coated balloon angioplasty with excellent clinical on an angiographic result.  Her claudication on the left has resolved and her foot is warm.  She does have a CTO on the right side with three-vessel runoff and wishes to proceed with staged right SFA intervention.      04/04/2020 MD FACP,FACC,FAHA, [facility] 04/15/2020 2:56 PM

## 2020-04-15 NOTE — Patient Instructions (Addendum)
    [facility] CARDIOVASCULAR DIVISION [facility] [address] [place] [address] Dept: [phone] Loc: [phone]  Mary Campbell  04/15/2020  You are scheduled for a Peripheral Angiogram on Thursday, April 7 with Dr. [name].  1. Please arrive at the [facility] (Main Entrance A) at [facility]: [address] [place], [place] [address] at 5:30 AM (This time is two hours before your procedure to ensure your preparation). Free valet parking service is available.   Special note: Every effort is made to have your procedure done on time. Please understand that emergencies sometimes delay scheduled procedures.  2. Diet: Do not eat solid foods after midnight.  The patient may have clear liquids until 5am upon the day of the procedure.  3. Labs: You will need to have blood drawn today.  4. Medication instructions in preparation for your procedure:   Do not take Diabetes Med Glucophage (Metformin) on the day of the procedure and HOLD 48 HOURS AFTER THE PROCEDURE.  On the morning of your procedure, take your Aspirin and Plavix/Clopidogrel and any morning medicines NOT listed above.  You may use sips of water.  5. Plan for one night stay--bring personal belongings. 6. Bring a current list of your medications and current insurance cards. 7. You MUST have a responsible person to drive you home. 8. Someone MUST be with you the first 24 hours after you arrive home or your discharge will be delayed. 9. Please wear clothes that are easy to get on and off and wear slip-on shoes.  Thank you for allowing [place] to care for you!   -- [facility]  You will need a COVID-19  test prior to your procedure. You are scheduled for Tuesday 4/5 at 10:30 AM. This is a Drive Up Visit at [address] [place], [place] [address]. Someone will direct you to the appropriate testing line. Stay in your car and  someone will be with you shortly.  You will need Lower extremity ultrasound with ABIs 1 week after procedure.  You will need a 2 week follow up office visit with Dr. Allyson Sabal after procedure.

## 2020-04-15 NOTE — Progress Notes (Signed)
04/15/2020 Mary Campbell   Oct 20, 1965  [id number]  Primary Physician Ralene Ok, MD Primary Cardiologist: Runell Gess MD [name], [place]  HPI:  Mary Campbell is a 55 y.o.  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 She was referred by Dr.O'Neal, her primary cardiologist, for peripheral vascular valuation because of lifestyle limiting claudication.I last saw her in the office 03/11/2020.Her cardiac risk factors are notable for 40-50-pack-year tobacco abuse currently trying to stop on Chantix, treated hypertension, diabetes and hyperlipidemia as well as family history with a father who had multiple stents myocardial infarction's. She is never had a heart attack or stroke. She currently denies chest pain or shortness of breath although recently she did have chest pain and was evaluated by Dr. Carmon Ginsberg. She had a negative Myoview and a normal 2D echo. She is complained of claudication over the last year which is fairly symmetric. This is not bothering her during her workday but when walking longer distances, out shopping, she does have to stop because of cramping in her calves. Doppler studies performed 09/26/2018 revealed ABIs of about 0.7 bilaterally with an occluded above-the-knee popliteal on the right and high-frequency signal in the proximal left SFA.  I performed peripheral angiography on her 10/18/2018 revealing short segment CTO mid to distal right SFA with three-vessel runoff and 95% / 60% segmental proximal left SFA stenosis with three-vessel runoff. Performed directional atherectomy followed by drug-coated balloon angioplasty of the proximal left SFA with an excellent result. Her claudication resolved and her Dopplers improved with a left ABI 0.84. She wishes to proceed with right SFA intervention.  I performed right SFA directional arthrectomy followed by drug-coated balloon angioplasty on 11/22/2018 with excellent  angiographic result. Her Dopplers improved and her claudication has resolved. Of note, she did stop smoking on 11/20/2018. She no longer has pain in her legs at night and is able to ambulate without limitation.  Doppler studies performed 02/26/2020 that suggest restenosis of both SFAs.  She relates to me that she is moderately symptomatic and wishes to proceed with percutaneous intervention.  She is on Chantix and successfully stopped smoking on 03/30/2020.  I performed peripheral angiography on her 04/02/2020 revealing high-grade segmental mid left SFA stenosis with three-vessel runoff.  I performed directional atherectomy followed by drug-coated balloon angioplasty with an excellent angiographic and clinical result.  Claudication on that side resolved and her foot is now warm.  She has a moderately long right SFA CTO which she wishes to have revascularized.    Current Meds  Medication Sig  . acetaminophen (TYLENOL) 650 MG CR tablet Take 1,300 mg by mouth every 8 (eight) hours as needed for pain.  Marland Kitchen aspirin EC 81 MG tablet Take 81 mg by mouth daily.  . Azilsartan-Chlorthalidone (EDARBYCLOR) 40-12.5 MG TABS Take 1 tablet by mouth daily.  . clopidogrel (PLAVIX) 75 MG tablet Take 1 tablet (75 mg total) by mouth daily.  Marland Kitchen glimepiride (AMARYL) 2 MG tablet Take 2 mg by mouth daily.  Marland Kitchen ibuprofen (ADVIL) 200 MG tablet Take 400 mg by mouth every 6 (six) hours as needed for moderate pain or headache.  . insulin degludec (TRESIBA FLEXTOUCH) 100 UNIT/ML FlexTouch Pen Inject 48 Units into the skin at bedtime.  . Melatonin 10 MG CAPS Take 10 mg by mouth at bedtime as needed (sleep).  . metFORMIN (GLUCOPHAGE) 1000 MG tablet Take 1,000 mg by mouth 2 (two) times daily with a meal.  . nicotine polacrilex (  COMMIT) 4 MG lozenge Take 4 mg by mouth as needed for smoking cessation.  . Phenylephrine-Acetaminophen 5-325 MG TABS Take 2 tablets by mouth daily as needed (allergy / sinus headaches).  . rosuvastatin  (CRESTOR) 40 MG tablet Take 1 tablet (40 mg total) by mouth daily.   Current Facility-Administered Medications for the 04/15/20 encounter (Office Visit) with Agapito Hanway J, MD  Medication  . sodium chloride flush (NS) 0.9 % injection 3 mL     Allergies  Allergen Reactions  . Codeine Hives  . Lisinopril Cough  . Sulfonamide Derivatives Hives    Social History   Socioeconomic History  . Marital status: Legally Separated    Spouse name: Not on file  . Number of children: Not on file  . Years of education: Not on file  . Highest education level: Not on file  Occupational History  . Not on file  Tobacco Use  . Smoking status: Current Every Day Smoker    Packs/day: 1.00    Types: Cigarettes  . Smokeless tobacco: Never Used  Vaping Use  . Vaping Use: Never used  Substance and Sexual Activity  . Alcohol use: No  . Drug use: No  . Sexual activity: Yes    Birth control/protection: Surgical  Other Topics Concern  . Not on file  Social History Narrative  . Not on file   Social Determinants of Health   Financial Resource Strain: Not on file  Food Insecurity: Not on file  Transportation Needs: Not on file  Physical Activity: Not on file  Stress: Not on file  Social Connections: Not on file  Intimate Partner Violence: Not on file     Review of Systems: General: negative for chills, fever, night sweats or weight changes.  Cardiovascular: negative for chest pain, dyspnea on exertion, edema, orthopnea, palpitations, paroxysmal nocturnal dyspnea or shortness of breath Dermatological: negative for rash Respiratory: negative for cough or wheezing Urologic: negative for hematuria Abdominal: negative for nausea, vomiting, diarrhea, bright red blood per rectum, melena, or hematemesis Neurologic: negative for visual changes, syncope, or dizziness All other systems reviewed and are otherwise negative except as noted above.    Blood pressure (!) 132/52, pulse 85, height 5' 4"  (1.626 m), weight 184 lb (83.5 kg).  General appearance: alert and no distress Neck: no adenopathy, no carotid bruit, no JVD, supple, symmetrical, trachea midline and thyroid not enlarged, symmetric, no tenderness/mass/nodules Lungs: clear to auscultation bilaterally Heart: regular rate and rhythm, S1, S2 normal, no murmur, click, rub or gallop Extremities: extremities normal, atraumatic, no cyanosis or edema Pulses: Absent right pedal pulse Skin: Skin color, texture, turgor normal. No rashes or lesions Neurologic: Alert and oriented X 3, normal strength and tone. Normal symmetric reflexes. Normal coordination and gait  EKG not performed today  ASSESSMENT AND PLAN:   Tobacco abuse Discontinue tobacco abuse on 03/30/2020  Claudication in peripheral vascular disease (HCC) History of peripheral arterial disease with lifestyle limiting claudication status post peripheral angiography 04/02/2020 revealing high-grade segmental left SFA stenosis with three-vessel runoff status post directional atherectomy and drug-coated balloon angioplasty with excellent clinical on an angiographic result.  Her claudication on the left has resolved and her foot is warm.  She does have a CTO on the right side with three-vessel runoff and wishes to proceed with staged right SFA intervention.      Levelle Edelen J. Leeyah Heather MD FACP,FACC,FAHA, FSCAI 04/15/2020 2:56 PM 

## 2020-04-16 LAB — CBC
Hematocrit: 36.5 % (ref 34.0–46.6)
Hemoglobin: 12 g/dL (ref 11.1–15.9)
MCH: 28.7 pg (ref 26.6–33.0)
MCHC: 32.9 g/dL (ref 31.5–35.7)
MCV: 87 fL (ref 79–97)
Platelets: 323 10*3/uL (ref 150–450)
RBC: 4.18 x10E6/uL (ref 3.77–5.28)
RDW: 14.4 % (ref 11.7–15.4)
WBC: 11.3 10*3/uL — ABNORMAL HIGH (ref 3.4–10.8)

## 2020-04-16 LAB — BASIC METABOLIC PANEL
BUN/Creatinine Ratio: 10 (ref 9–23)
BUN: 9 mg/dL (ref 6–24)
CO2: 23 mmol/L (ref 20–29)
Calcium: 9.4 mg/dL (ref 8.7–10.2)
Chloride: 98 mmol/L (ref 96–106)
Creatinine, Ser: 0.89 mg/dL (ref 0.57–1.00)
Glucose: 141 mg/dL — ABNORMAL HIGH (ref 65–99)
Potassium: 4.4 mmol/L (ref 3.5–5.2)
Sodium: 139 mmol/L (ref 134–144)
eGFR: 76 mL/min/{1.73_m2} (ref >59)

## 2020-04-18 ENCOUNTER — Other Ambulatory Visit: Payer: Self-pay | Admitting: Cardiovascular Disease

## 2020-04-21 ENCOUNTER — Other Ambulatory Visit (HOSPITAL_COMMUNITY)
Admission: RE | Admit: 2020-04-21 | Discharge: 2020-04-21 | Disposition: A | Discharge status: Home / Self Care | Payer: Managed Care, Other (non HMO) | Source: Ambulatory Visit | Attending: Cardiovascular Disease | Admitting: Cardiovascular Disease

## 2020-04-21 DIAGNOSIS — Z20822 Contact with and (suspected) exposure to covid-19: Secondary | ICD-10-CM | POA: Diagnosis not present

## 2020-04-21 DIAGNOSIS — Z01812 Encounter for preprocedural laboratory examination: Secondary | ICD-10-CM | POA: Insufficient documentation

## 2020-04-21 DIAGNOSIS — Z0279 Encounter for issue of other medical certificate: Secondary | ICD-10-CM

## 2020-04-21 LAB — SARS CORONAVIRUS 2 (TAT 6-24 HRS): SARS Coronavirus 2: NEGATIVE

## 2020-04-21 NOTE — Telephone Encounter (Signed)
Paperwork was received back from provider, paperwork was faxed and patient was mailed a copy.

## 2020-04-22 ENCOUNTER — Telehealth: Payer: Self-pay | Admitting: *Deleted

## 2020-04-22 NOTE — Telephone Encounter (Signed)
Pt contacted pre-abdominal aortogram scheduled at [facility] for: Thursday April 23, 2020 7:30 AM Verified arrival time and place: [facility] Main Entrance A [facility]) at: 5:30 AM   No solid food after midnight prior to cath, clear liquids until 5 AM day of procedure.  Hold: Metformin-day of procedure and 48 hours post procedure Glimpiride-AM of procedure Insulin-1/2 usual HS dose prior to procedure Edarbychlor-AM of procedure  Except hold medications AM meds can be  taken pre-cath with sips of water including: ASA 81 mg Plavix 75 mg  Confirmed patient has responsible adult to drive home post procedure and be with patient first 24 hours after arriving home: yes  You are allowed ONE visitor in the waiting room during the time you are at the hospital for your procedure. Both you and your visitor must wear a mask once you enter the hospital.  Reviewed procedure/mask/visitor instructions with patient.

## 2020-04-23 ENCOUNTER — Ambulatory Visit (HOSPITAL_COMMUNITY)
Admission: RE | Admit: 2020-04-23 | Discharge: 2020-04-23 | Disposition: A | Discharge status: Home / Self Care | Payer: Managed Care, Other (non HMO) | Attending: Cardiovascular Disease | Admitting: Cardiovascular Disease

## 2020-04-23 ENCOUNTER — Encounter (HOSPITAL_COMMUNITY): Payer: Self-pay | Admitting: Cardiovascular Disease

## 2020-04-23 ENCOUNTER — Encounter (HOSPITAL_COMMUNITY)
Admission: RE | Disposition: A | Discharge status: Home / Self Care | Payer: Self-pay | Source: Home / Self Care | Attending: Cardiovascular Disease

## 2020-04-23 DIAGNOSIS — Z885 Allergy status to narcotic agent status: Secondary | ICD-10-CM | POA: Diagnosis not present

## 2020-04-23 DIAGNOSIS — Z7902 Long term (current) use of antithrombotics/antiplatelets: Secondary | ICD-10-CM | POA: Insufficient documentation

## 2020-04-23 DIAGNOSIS — E1151 Type 2 diabetes mellitus with diabetic peripheral angiopathy without gangrene: Secondary | ICD-10-CM | POA: Insufficient documentation

## 2020-04-23 DIAGNOSIS — Z7984 Long term (current) use of oral hypoglycemic drugs: Secondary | ICD-10-CM | POA: Insufficient documentation

## 2020-04-23 DIAGNOSIS — I70213 Atherosclerosis of native arteries of extremities with intermittent claudication, bilateral legs: Secondary | ICD-10-CM | POA: Diagnosis not present

## 2020-04-23 DIAGNOSIS — I10 Essential (primary) hypertension: Secondary | ICD-10-CM | POA: Insufficient documentation

## 2020-04-23 DIAGNOSIS — I739 Peripheral vascular disease, unspecified: Secondary | ICD-10-CM

## 2020-04-23 DIAGNOSIS — Z794 Long term (current) use of insulin: Secondary | ICD-10-CM | POA: Diagnosis not present

## 2020-04-23 DIAGNOSIS — Z882 Allergy status to sulfonamides status: Secondary | ICD-10-CM | POA: Diagnosis not present

## 2020-04-23 DIAGNOSIS — Z7982 Long term (current) use of aspirin: Secondary | ICD-10-CM | POA: Diagnosis not present

## 2020-04-23 DIAGNOSIS — Z79899 Other long term (current) drug therapy: Secondary | ICD-10-CM | POA: Insufficient documentation

## 2020-04-23 DIAGNOSIS — Z888 Allergy status to other drugs, medicaments and biological substances status: Secondary | ICD-10-CM | POA: Insufficient documentation

## 2020-04-23 DIAGNOSIS — F17210 Nicotine dependence, cigarettes, uncomplicated: Secondary | ICD-10-CM | POA: Diagnosis not present

## 2020-04-23 DIAGNOSIS — E785 Hyperlipidemia, unspecified: Secondary | ICD-10-CM | POA: Insufficient documentation

## 2020-04-23 DIAGNOSIS — I70211 Atherosclerosis of native arteries of extremities with intermittent claudication, right leg: Secondary | ICD-10-CM | POA: Diagnosis not present

## 2020-04-23 HISTORY — PX: PERIPHERAL VASCULAR ATHERECTOMY: CATH118256

## 2020-04-23 HISTORY — PX: ABDOMINAL AORTOGRAM W/LOWER EXTREMITY: CATH118223

## 2020-04-23 HISTORY — PX: PERIPHERAL VASCULAR BALLOON ANGIOPLASTY: CATH118281

## 2020-04-23 LAB — GLUCOSE, CAPILLARY
Glucose-Capillary: 244 mg/dL — ABNORMAL HIGH (ref 70–99)
Glucose-Capillary: 189 mg/dL — ABNORMAL HIGH (ref 70–99)

## 2020-04-23 LAB — POCT ACTIVATED CLOTTING TIME
Activated Clotting Time: 285 seconds
Activated Clotting Time: 267 seconds
Activated Clotting Time: 285 seconds

## 2020-04-23 SURGERY — ABDOMINAL AORTOGRAM W/LOWER EXTREMITY
Anesthesia: LOCAL

## 2020-04-23 MED ORDER — HEPARIN (PORCINE) IN NACL 1000-0.9 UT/500ML-% IV SOLN
INTRAVENOUS | Status: AC
  Filled 2020-04-23: qty 1000

## 2020-04-23 MED ORDER — MIDAZOLAM HCL 2 MG/2ML IJ SOLN
INTRAMUSCULAR | Status: AC
  Filled 2020-04-23: qty 2

## 2020-04-23 MED ORDER — SODIUM CHLORIDE 0.9 % IV SOLN: 250.0000 mL | INTRAVENOUS | Status: DC | PRN

## 2020-04-23 MED ORDER — HYDRALAZINE HCL 20 MG/ML IJ SOLN
INTRAMUSCULAR | Status: DC | PRN
  Administered 2020-04-23: 10 mg via INTRAVENOUS

## 2020-04-23 MED ORDER — HEPARIN SODIUM (PORCINE) 1000 UNIT/ML IJ SOLN
INTRAMUSCULAR | Status: DC | PRN
  Administered 2020-04-23 (×2): 2000 [IU] via INTRAVENOUS
  Administered 2020-04-23: 9000 [IU] via INTRAVENOUS

## 2020-04-23 MED ORDER — FENTANYL CITRATE (PF) 100 MCG/2ML IJ SOLN
INTRAMUSCULAR | Status: AC
  Filled 2020-04-23: qty 2

## 2020-04-23 MED ORDER — HEPARIN SODIUM (PORCINE) 1000 UNIT/ML IJ SOLN
INTRAMUSCULAR | Status: AC
  Filled 2020-04-23: qty 1

## 2020-04-23 MED ORDER — HEPARIN (PORCINE) IN NACL 1000-0.9 UT/500ML-% IV SOLN
INTRAVENOUS | Status: DC | PRN
  Administered 2020-04-23 (×2): 500 mL

## 2020-04-23 MED ORDER — LIDOCAINE HCL (PF) 1 % IJ SOLN
INTRAMUSCULAR | Status: DC | PRN
  Administered 2020-04-23: 15 mL via INTRADERMAL

## 2020-04-23 MED ORDER — FENTANYL CITRATE (PF) 100 MCG/2ML IJ SOLN
INTRAMUSCULAR | Status: DC | PRN
  Administered 2020-04-23: 25 ug via INTRAVENOUS

## 2020-04-23 MED ORDER — MIDAZOLAM HCL 2 MG/2ML IJ SOLN
INTRAMUSCULAR | Status: DC | PRN
  Administered 2020-04-23: 1 mg via INTRAVENOUS

## 2020-04-23 MED ORDER — ASPIRIN 81 MG PO CHEW: 81.0000 mg | CHEWABLE_TABLET | ORAL | Status: DC

## 2020-04-23 MED ORDER — NITROGLYCERIN 1 MG/10 ML FOR IR/CATH LAB
INTRA_ARTERIAL | Status: AC
  Filled 2020-04-23: qty 10

## 2020-04-23 MED ORDER — SODIUM CHLORIDE 0.9 % WEIGHT BASED INFUSION
3.0000 mL/kg/h | INTRAVENOUS | Status: AC
  Administered 2020-04-23: 3 mL/kg/h via INTRAVENOUS

## 2020-04-23 MED ORDER — SODIUM CHLORIDE 0.9% FLUSH: 3.0000 mL | INTRAVENOUS | Status: DC | PRN

## 2020-04-23 MED ORDER — IODIXANOL 320 MG/ML IV SOLN
INTRAVENOUS | Status: DC | PRN
  Administered 2020-04-23: 225 mL via INTRA_ARTERIAL

## 2020-04-23 MED ORDER — SODIUM CHLORIDE 0.9 % WEIGHT BASED INFUSION: 1.0000 mL/kg/h | INTRAVENOUS | Status: DC

## 2020-04-23 MED ORDER — HYDRALAZINE HCL 20 MG/ML IJ SOLN
INTRAMUSCULAR | Status: AC
  Filled 2020-04-23: qty 1

## 2020-04-23 SURGICAL SUPPLY — 27 items
BALLN ADMIRAL INPACT 5X250 (BALLOONS) ×3
BALLN JADE .018 2.5 X 150 (BALLOONS) ×3
BALLOON ADMIRAL INPACT 5X250 (BALLOONS) IMPLANT
BALLOON JADE .018 2.5 X 150 (BALLOONS) IMPLANT
CATH ANGIO 5F PIGTAIL 65CM (CATHETERS) ×1 IMPLANT
CATH CROSS OVER TEMPO 5F (CATHETERS) ×1 IMPLANT
CATH HAWKONE LX EXTENDED TIP (CATHETERS) ×1 IMPLANT
CATH VIANCE CROSS STAND 150CM (MICROCATHETER) ×3
CATH VIANCE CROSS STD 150CM (MICROCATHETER) IMPLANT
CLOSURE PERCLOSE PROSTYLE (VASCULAR PRODUCTS) ×1 IMPLANT
DEVICE SPIDERFX EMB PROT 6MM (WIRE) ×1 IMPLANT
GUIDEWIRE ZILIENT 6G 014 (WIRE) ×1 IMPLANT
KIT ENCORE 26 ADVANTAGE (KITS) ×1 IMPLANT
KIT ESSENTIALS PG (KITS) ×1 IMPLANT
KIT PV (KITS) ×3 IMPLANT
SHEATH HIGHFLEX ANSEL 7FR 55CM (SHEATH) ×1 IMPLANT
SHEATH PINNACLE 5F 10CM (SHEATH) ×1 IMPLANT
SHEATH PINNACLE 7F 10CM (SHEATH) ×1 IMPLANT
SHEATH PROBE COVER 6X72 (BAG) ×1 IMPLANT
STOPCOCK MORSE 400PSI 3WAY (MISCELLANEOUS) ×1 IMPLANT
SYR MEDRAD MARK 7 150ML (SYRINGE) ×3 IMPLANT
TAPE VIPERTRACK RADIOPAQ (MISCELLANEOUS) IMPLANT
TAPE VIPERTRACK RADIOPAQUE (MISCELLANEOUS) ×3
TRANSDUCER W/STOPCOCK (MISCELLANEOUS) ×3 IMPLANT
TRAY PV CATH (CUSTOM PROCEDURE TRAY) ×3 IMPLANT
TUBING CIL FLEX 10 FLL-RA (TUBING) ×1 IMPLANT
WIRE HITORQ VERSACORE ST 145CM (WIRE) ×1 IMPLANT

## 2020-04-23 NOTE — Interval H&P Note (Signed)
History and Physical Interval Note:  04/23/2020 7:39 AM  Mary Campbell  has presented today for surgery, with the diagnosis of PAD.  The various methods of treatment have been discussed with the patient and family. After consideration of risks, benefits and other options for treatment, the patient has consented to  Procedure(s): ABDOMINAL AORTOGRAM W/LOWER EXTREMITY (N/A) as a surgical intervention.  The patient's history has been reviewed, patient examined, no change in status, stable for surgery.  I have reviewed the patient's chart and labs.  Questions were answered to the patient's satisfaction.     [name]

## 2020-04-23 NOTE — Discharge Instructions (Signed)
Angiogram, Care After This sheet gives you information about how to care for yourself after your procedure. Your health care provider may also give you more specific instructions. If you have problems or questions, contact your health care provider. What can I expect after the procedure? After the procedure, it is common to have:  Bruising and tenderness at the catheter insertion area.  A collection of blood (hematoma) at the insertion area. This may feel like a small lump under the skin at the insertion site. Follow these instructions at home: Insertion site care  Follow instructions from your health care provider about how to take care of your insertion site. Make sure you: ? Wash your hands with soap and water before and after you change your bandage (dressing). If soap and water are not available, use hand sanitizer. ? Change your dressing as told by your health care provider.  Do not take baths, swim, or use a hot tub until your health care provider approves.  You may shower 24-48 hours after the procedure, or as told by your health care provider. To clean the insertion site: ? Gently wash the area with plain soap and water. ? Pat the area dry with a clean towel. ? Do not rub the site. This may cause bleeding.  Check your insertion site every day for signs of infection. Check for: ? Redness, swelling, or pain. ? Fluid or blood. ? Warmth. ? Pus or a bad smell.  Do not apply powder or lotion to the site. Keep the site clean and dry.   Activity  Do not drive for 24 hours if you were given a sedative during your procedure.  Rest as told by your health care provider, usually for 1-2 days.  Do not lift anything that is heavier than 10 lb (4.5 kg), or the limit that you are told, until your health care provider says that it is safe.  If the insertion site was in your leg, try to avoid stairs for a few days.  Return to your normal activities as told by your health care provider,  usually in about a week. Ask your health care provider what activities are safe for you. General instructions  If your insertion site starts bleeding, lie flat and put pressure on the site. If the bleeding does not stop, get help right away. This is a medical emergency.  Take over-the-counter and prescription medicines only as told by your health care provider.  Drink enough fluid to keep your urine pale yellow. This helps flush the contrast dye from your body.  Keep all follow-up visits as told by your health care provider. This is important.   Contact a health care provider if:  You have a fever or chills.  You have redness, swelling, or pain around your insertion site.  You have fluid or blood coming from your insertion site.  Your insertion site feels warm to the touch.  You have pus or a bad smell coming from your insertion site.  You have more bruising around the insertion site. Get help right away if you have:  A problem with the insertion area, such as: ? The area swells fast or bleeds even after you apply pressure. ? The area becomes pale, cool, tingly, or numb.  Chest pain.  Trouble breathing.  A rash.  Any symptoms of a stroke. "BE FAST" is an easy way to remember the main warning signs of a stroke: ? B - Balance. Signs are dizziness, sudden trouble walking,   or loss of balance. ? E - Eyes. Signs are trouble seeing or a sudden change in vision. ? F - Face. Signs are sudden weakness or loss of feeling of the face, or the face or eyelid drooping on one side. ? A - Arms. Signs are weakness or loss of feeling in an arm. This happens suddenly and usually on one side of the body. ? S - Speech. Signs are sudden trouble speaking, slurred speech, or trouble understanding what people say. ? T - Time. Time to call emergency services. Write down what time symptoms started.  You have other signs of a stroke, such as: ? A sudden, severe headache with no known cause. ? Nausea  or vomiting. ? Seizure. These symptoms may represent a serious problem that is an emergency. Do not wait to see if the symptoms will go away. Get medical help right away. Call your local emergency services (911 in the [place] Do not drive yourself to the hospital. Summary  It is common to have bruising and tenderness at the catheter insertion area.  Do not take baths, swim, or use a hot tub until your health care provider approves. You may shower 24-48 hours after the procedure or as told.  It is important to rest and drink plenty of fluids.  If the insertion site bleeds, lie flat and put pressure on the site. If the bleeding continues, get help right away. This is a medical emergency. This information is not intended to replace advice given to you by your health care provider. Make sure you discuss any questions you have with your health care provider. Document Revised: 11/07/2018 Document Reviewed: 11/07/2018 Elsevier Patient Education  2021 [employer]vier Inc.  

## 2020-04-23 NOTE — Progress Notes (Signed)
Discharge instruction given per MD order.  Pt was able to verbalize understanding. 

## 2020-04-24 NOTE — Telephone Encounter (Signed)
Spoke with [employer] regarding this message. Per Mary Campbell she spoke with the pt and they just received confirmation that 2nd copy of paperwork was faxed and received.

## 2020-04-30 ENCOUNTER — Other Ambulatory Visit: Payer: Self-pay

## 2020-04-30 ENCOUNTER — Other Ambulatory Visit (HOSPITAL_COMMUNITY): Payer: Self-pay | Admitting: Cardiovascular Disease

## 2020-04-30 ENCOUNTER — Ambulatory Visit (HOSPITAL_COMMUNITY)
Admission: RE | Admit: 2020-04-30 | Discharge: 2020-04-30 | Disposition: A | Discharge status: Home / Self Care | Payer: Managed Care, Other (non HMO) | Source: Ambulatory Visit | Attending: Internal Medicine | Admitting: Internal Medicine

## 2020-04-30 DIAGNOSIS — Z9862 Peripheral vascular angioplasty status: Secondary | ICD-10-CM

## 2020-04-30 DIAGNOSIS — I739 Peripheral vascular disease, unspecified: Secondary | ICD-10-CM

## 2020-05-06 ENCOUNTER — Ambulatory Visit (INDEPENDENT_AMBULATORY_CARE_PROVIDER_SITE_OTHER): Payer: Managed Care, Other (non HMO) | Admitting: Cardiovascular Disease

## 2020-05-06 ENCOUNTER — Encounter: Payer: Self-pay | Admitting: Cardiovascular Disease

## 2020-05-06 ENCOUNTER — Other Ambulatory Visit: Payer: Self-pay

## 2020-05-06 DIAGNOSIS — I739 Peripheral vascular disease, unspecified: Secondary | ICD-10-CM | POA: Diagnosis not present

## 2020-05-06 DIAGNOSIS — Z720 Tobacco use: Secondary | ICD-10-CM

## 2020-05-06 NOTE — Patient Instructions (Signed)
Medication Instructions:  Your physician recommends that you continue on your current medications as directed. Please refer to the Current Medication list given to you today.  *If you need a refill on your cardiac medications before your next appointment, please call your pharmacy*   Testing/Procedures: Your physician has requested that you have a lower extremity arterial duplex. This test is an ultrasound of the arteries in the legs. It looks at arterial blood flow in the legs. Allow one hour for Lower Arterial scans. There are no restrictions or special instructions  Your physician has requested that you have an ankle brachial index (ABI). During this test an ultrasound and blood pressure cuff are used to evaluate the arteries that supply the arms and legs with blood. Allow thirty minutes for this exam. There are no restrictions or special instructions.  These procedures are done at [address] [facility]. 2nd Floor. To be done in Oct.    Follow-Up: At [facility], you and your health needs are our priority.  As part of our continuing mission to provide you with exceptional heart care, we have created designated Provider Care Teams.  These Care Teams include your primary Cardiologist (physician) and Advanced Practice Providers (APPs -  Physician Assistants and Nurse Practitioners) who all work together to provide you with the care you need, when you need it.  We recommend signing up for the patient portal called "MyChart".  Sign up information is provided on this After Visit Summary.  MyChart is used to connect with patients for Virtual Visits (Telemedicine).  Patients are able to view lab/test results, encounter notes, upcoming appointments, etc.  Non-urgent messages can be sent to your provider as well.   To learn more about what you can do with MyChart, go to [email or web address].    Your next appointment:   6 month(s)  The format for your next appointment:   In Person  Provider:    [name], MD   Other Instructions Note to return to work.

## 2020-05-06 NOTE — Assessment & Plan Note (Signed)
History of PAD status post right and left SFA intervention in a staged fashion October and November 2020 with demonstration of aggressive restenosis and recent reintervention on 04/02/2020 of her left SFA, and 04/23/2020 of her right SFA.  I performed Perclose and sent her home the same day.  Her follow-up lower extremity arterial Doppler studies performed 04/30/2020 revealed ABIs in the mid 0.8 range with patent SFAs.  Her claudication has resolved.  We will get repeat LEAs in 6 months after which I will see her back in follow-up.

## 2020-05-06 NOTE — Progress Notes (Signed)
05/06/2020 Mary Campbell   1965/11/11  [id number]  Primary Physician Ralene Ok, MD Primary Cardiologist: Runell Gess MD [name], [place]  HPI:  Mary Campbell is a 55 y.o.  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 She was referred by Dr.O'Neal, her primary cardiologist, for peripheral vascular valuation because of lifestyle limiting claudication.I last saw her in the office 04/15/2020.Her cardiac risk factors are notable for 40-50-pack-year tobacco abuse currently trying to stop on Chantix, treated hypertension, diabetes and hyperlipidemia as well as family history with a father who had multiple stents myocardial infarction's. She is never had a heart attack or stroke. She currently denies chest pain or shortness of breath although recently she did have chest pain and was evaluated by Dr. Carmon Ginsberg. She had a negative Myoview and a normal 2D echo. She is complained of claudication over the last year which is fairly symmetric. This is not bothering her during her workday but when walking longer distances, out shopping, she does have to stop because of cramping in her calves. Doppler studies performed 09/26/2018 revealed ABIs of about 0.7 bilaterally with an occluded above-the-knee popliteal on the right and high-frequency signal in the proximal left SFA.  I performed peripheral angiography on her 10/18/2018 revealing short segment CTO mid to distal right SFA with three-vessel runoff and 95% / 60% segmental proximal left SFA stenosis with three-vessel runoff. Performed directional atherectomy followed by drug-coated balloon angioplasty of the proximal left SFA with an excellent result. Her claudication resolved and her Dopplers improved with a left ABI 0.84. She wishes to proceed with right SFA intervention.  I performed right SFA directional arthrectomy followed by drug-coated balloon angioplasty on 11/22/2018 with excellent  angiographic result. Her Dopplers improved and her claudication has resolved. Of note, she did stop smoking on 11/20/2018. She no longer has pain in her legs at night and is able to ambulate without limitation.  Doppler studies performed 02/26/2020 that suggest restenosis of both SFAs. She relates to me that she is moderately symptomatic and wishes to proceed with percutaneous intervention. She is on Chantix and successfully stopped smoking on 03/30/2020.  I performed peripheral angiography on her 04/02/2020 revealing high-grade segmental mid left SFA stenosis with three-vessel runoff.  I performed directional atherectomy followed by drug-coated balloon angioplasty with an excellent angiographic and clinical result.  Claudication on that side resolved and her foot is now warm.    I performed right SFA intervention on her/7/22 with directional atherectomy followed by [facility] using distal protection.  She went home the same day after I performed Perclose of her left common femoral artery.  Her follow-up lower extremity arterial Doppler studies performed on 04/30/2020 revealed ABIs in the mid 0.8 range with patent SFAs bilaterally.  Her claudication has resolved.  She did stop smoking on 03/30/2020.  Unfortunately however her hemoglobin A1c has remained elevated.   Current Meds  Medication Sig  . acetaminophen (TYLENOL) 650 MG CR tablet Take 1,300 mg by mouth every 8 (eight) hours as needed for pain.  Marland Kitchen aspirin EC 81 MG tablet Take 81 mg by mouth daily.  . Azilsartan-Chlorthalidone (EDARBYCLOR) 40-12.5 MG TABS Take 1 tablet by mouth daily.  . clopidogrel (PLAVIX) 75 MG tablet Take 1 tablet (75 mg total) by mouth daily.  . diphenhydrAMINE (BENADRYL) 25 mg capsule Take 25 mg by mouth every 6 (six) hours as needed for allergies.  Marland Kitchen glimepiride (AMARYL) 2 MG tablet Take 2 mg by mouth  daily.  . ibuprofen (ADVIL) 200 MG tablet Take 400 mg by mouth every 6 (six) hours as needed for moderate pain or headache.  .  insulin degludec (TRESIBA FLEXTOUCH) 100 UNIT/ML FlexTouch Pen Inject 42 Units into the skin at bedtime.  . Melatonin 10 MG CAPS Take 10 mg by mouth at bedtime as needed (sleep).  . metFORMIN (GLUCOPHAGE) 1000 MG tablet Take 1,000 mg by mouth 2 (two) times daily with a meal.  . nicotine polacrilex (COMMIT) 4 MG lozenge Take 4 mg by mouth daily as needed for smoking cessation.  Marland Kitchen Phenylephrine-Acetaminophen 5-325 MG TABS Take 2 tablets by mouth daily as needed (allergy / sinus headaches).  . rosuvastatin (CRESTOR) 40 MG tablet Take 1 tablet by mouth once daily   Current Facility-Administered Medications for the 05/06/20 encounter (Office Visit) with Runell Gess, MD  Medication  . sodium chloride flush (NS) 0.9 % injection 3 mL  . sodium chloride flush (NS) 0.9 % injection 3 mL     Allergies  Allergen Reactions  . Codeine Hives  . Lisinopril Cough  . Sulfonamide Derivatives Hives    Social History   Socioeconomic History  . Marital status: Legally Separated    Spouse name: Not on file  . Number of children: Not on file  . Years of education: Not on file  . Highest education level: Not on file  Occupational History  . Not on file  Tobacco Use  . Smoking status: Former Smoker    Packs/day: 1.00    Types: Cigarettes    Quit date: 03/30/2020    Years since quitting: 0.1  . Smokeless tobacco: Never Used  Vaping Use  . Vaping Use: Never used  Substance and Sexual Activity  . Alcohol use: No  . Drug use: No  . Sexual activity: Yes    Birth control/protection: Surgical  Other Topics Concern  . Not on file  Social History Narrative  . Not on file   Social Determinants of Health   Financial Resource Strain: Not on file  Food Insecurity: Not on file  Transportation Needs: Not on file  Physical Activity: Not on file  Stress: Not on file  Social Connections: Not on file  Intimate Partner Violence: Not on file     Review of Systems: General: negative for chills,  fever, night sweats or weight changes.  Cardiovascular: negative for chest pain, dyspnea on exertion, edema, orthopnea, palpitations, paroxysmal nocturnal dyspnea or shortness of breath Dermatological: negative for rash Respiratory: negative for cough or wheezing Urologic: negative for hematuria Abdominal: negative for nausea, vomiting, diarrhea, bright red blood per rectum, melena, or hematemesis Neurologic: negative for visual changes, syncope, or dizziness All other systems reviewed and are otherwise negative except as noted above.    Blood pressure 134/72, pulse 70, height 5\' 4"  (1.626 m), weight 184 lb 9.6 oz (83.7 kg).  General appearance: alert and no distress Neck: no adenopathy, no carotid bruit, no JVD, supple, symmetrical, trachea midline and thyroid not enlarged, symmetric, no tenderness/mass/nodules Lungs: clear to auscultation bilaterally Heart: regular rate and rhythm, S1, S2 normal, no murmur, click, rub or gallop Extremities: extremities normal, atraumatic, no cyanosis or edema Pulses: 2+ and symmetric Skin: Skin color, texture, turgor normal. No rashes or lesions Neurologic: Alert and oriented X 3, normal strength and tone. Normal symmetric reflexes. Normal coordination and gait  EKG not performed today  ASSESSMENT AND PLAN:   Tobacco abuse Discontinue tobacco abuse on 03/30/2020  Claudication in peripheral vascular disease (HCC)  History of PAD status post right and left SFA intervention in a staged fashion October and November 2020 with demonstration of aggressive restenosis and recent reintervention on 04/02/2020 of her left SFA, and 04/23/2020 of her right SFA.  I performed Perclose and sent her home the same day.  Her follow-up lower extremity arterial Doppler studies performed 04/30/2020 revealed ABIs in the mid 0.8 range with patent SFAs.  Her claudication has resolved.  We will get repeat LEAs in 6 months after which I will see her back in  follow-up.      Runell Gess MD FACP,FACC,FAHA, [facility] 05/06/2020 9:20 AM

## 2020-05-06 NOTE — Assessment & Plan Note (Signed)
Discontinue tobacco abuse on 03/30/2020

## 2020-07-15 ENCOUNTER — Other Ambulatory Visit: Payer: Self-pay | Admitting: Internal Medicine

## 2020-07-15 DIAGNOSIS — F17210 Nicotine dependence, cigarettes, uncomplicated: Secondary | ICD-10-CM

## 2020-07-16 ENCOUNTER — Telehealth: Payer: Self-pay | Admitting: Cardiovascular Disease

## 2020-07-16 ENCOUNTER — Other Ambulatory Visit: Payer: Self-pay | Admitting: Internal Medicine

## 2020-07-16 DIAGNOSIS — Z1231 Encounter for screening mammogram for malignant neoplasm of breast: Secondary | ICD-10-CM

## 2020-07-16 NOTE — Telephone Encounter (Signed)
Follow up:     Patient calling concerning some FMLA papers that her job states they have not received. Patient will terminated if this is not done. Patient dropped paper work in march or April. Please call patient.

## 2020-07-16 NOTE — Telephone Encounter (Signed)
Spoke to patient she stated she will be bringing FMLA paperwork to office to be completed.Advised to pay with cash,money order or a check.She will bring today.

## 2020-07-21 ENCOUNTER — Telehealth: Payer: Self-pay | Admitting: Cardiovascular Disease

## 2020-07-21 NOTE — Telephone Encounter (Signed)
[facility] received forms from [place], to be completed. Forms were given to Mary Campbell. On 7/5.

## 2020-07-23 NOTE — Telephone Encounter (Signed)
Patient came into office to pick up completed forms.

## 2020-08-07 ENCOUNTER — Ambulatory Visit
Admission: RE | Admit: 2020-08-07 | Discharge: 2020-08-07 | Disposition: A | Discharge status: Home / Self Care | Payer: Managed Care, Other (non HMO) | Source: Ambulatory Visit | Attending: Internal Medicine | Admitting: Internal Medicine

## 2020-08-07 DIAGNOSIS — F17210 Nicotine dependence, cigarettes, uncomplicated: Secondary | ICD-10-CM

## 2020-09-10 ENCOUNTER — Ambulatory Visit: Payer: Managed Care, Other (non HMO)

## 2020-09-24 ENCOUNTER — Other Ambulatory Visit: Payer: Self-pay

## 2020-09-24 ENCOUNTER — Ambulatory Visit
Admission: RE | Admit: 2020-09-24 | Discharge: 2020-09-24 | Disposition: A | Discharge status: Home / Self Care | Payer: Managed Care, Other (non HMO) | Source: Ambulatory Visit | Attending: Internal Medicine | Admitting: Internal Medicine

## 2020-09-24 DIAGNOSIS — Z1231 Encounter for screening mammogram for malignant neoplasm of breast: Secondary | ICD-10-CM

## 2020-10-20 ENCOUNTER — Ambulatory Visit (HOSPITAL_COMMUNITY): Payer: Managed Care, Other (non HMO)

## 2020-10-26 ENCOUNTER — Other Ambulatory Visit: Payer: Self-pay

## 2020-10-26 ENCOUNTER — Ambulatory Visit (HOSPITAL_COMMUNITY)
Admission: RE | Admit: 2020-10-26 | Discharge: 2020-10-26 | Disposition: A | Discharge status: Home / Self Care | Payer: Managed Care, Other (non HMO) | Source: Ambulatory Visit | Attending: Cardiovascular Disease | Admitting: Cardiovascular Disease

## 2020-10-26 DIAGNOSIS — Z9862 Peripheral vascular angioplasty status: Secondary | ICD-10-CM | POA: Diagnosis not present

## 2020-10-29 ENCOUNTER — Other Ambulatory Visit (HOSPITAL_COMMUNITY): Payer: Self-pay | Admitting: Cardiovascular Disease

## 2020-10-29 DIAGNOSIS — I739 Peripheral vascular disease, unspecified: Secondary | ICD-10-CM

## 2020-10-29 DIAGNOSIS — Z9862 Peripheral vascular angioplasty status: Secondary | ICD-10-CM

## 2020-11-03 ENCOUNTER — Ambulatory Visit (INDEPENDENT_AMBULATORY_CARE_PROVIDER_SITE_OTHER): Payer: Managed Care, Other (non HMO) | Admitting: Cardiovascular Disease

## 2020-11-03 ENCOUNTER — Other Ambulatory Visit: Payer: Self-pay

## 2020-11-03 ENCOUNTER — Encounter: Payer: Self-pay | Admitting: Cardiovascular Disease

## 2020-11-03 VITALS — BP 140/56 | Ht 65.0 in | Wt 182.0 lb

## 2020-11-03 DIAGNOSIS — I739 Peripheral vascular disease, unspecified: Secondary | ICD-10-CM | POA: Diagnosis not present

## 2020-11-03 DIAGNOSIS — R0989 Other specified symptoms and signs involving the circulatory and respiratory systems: Secondary | ICD-10-CM

## 2020-11-03 DIAGNOSIS — I4819 Other persistent atrial fibrillation: Secondary | ICD-10-CM | POA: Diagnosis not present

## 2020-11-03 DIAGNOSIS — I10 Essential (primary) hypertension: Secondary | ICD-10-CM

## 2020-11-03 NOTE — Progress Notes (Signed)
11/03/2020 Mary Campbell   05-06-1965  [id number]  Primary Physician Mary Ok, MD Primary Cardiologist: Mary Gess MD Mary Campbell, [place]  HPI:  Mary Campbell is a 55 y.o.  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  She was referred by Dr. Flora Campbell , her primary cardiologist, for peripheral vascular valuation because of lifestyle limiting claudication.  I last saw her in the office 05/06/2020. Her cardiac risk factors are notable for 40-50-pack-year tobacco abuse currently trying to stop on Chantix, treated hypertension, diabetes and hyperlipidemia as well as family history with a father who had multiple stents myocardial infarction's.  She is never had a heart attack or stroke.  She currently denies chest pain or shortness of breath although recently she did have chest pain and was evaluated by Dr. Carmon Campbell.  She had a negative Myoview and a normal 2D echo.  She is complained of claudication over the last year which is fairly symmetric.  This is not bothering her during her workday but when walking longer distances, out shopping, she does have to stop because of cramping in her calves.  Doppler studies performed 09/26/2018 revealed ABIs of about 0.7 bilaterally with an occluded above-the-knee popliteal on the right and high-frequency signal in the proximal left SFA.   I performed peripheral angiography on her 10/18/2018 revealing short segment CTO mid to distal right SFA with three-vessel runoff and 95% / 60% segmental proximal left SFA stenosis with three-vessel runoff.  Performed directional atherectomy followed by drug-coated balloon angioplasty of the proximal left SFA with an excellent result.  Her claudication resolved and her Dopplers improved with a left ABI 0.84.  She wishes to proceed with right SFA intervention.   I performed right SFA directional arthrectomy followed by drug-coated balloon angioplasty on 11/22/2018 with excellent  angiographic result.  Her Dopplers improved and her claudication has resolved.  Of note, she did stop smoking on 11/20/2018.  She no longer has pain in her legs at night and is able to ambulate without limitation.   Doppler studies performed 02/26/2020 that suggest restenosis of both SFAs.  She relates to me that she is moderately symptomatic and wishes to proceed with percutaneous intervention.  She is on Chantix and successfully stopped smoking on 03/30/2020.   I performed peripheral angiography on her 04/02/2020 revealing high-grade segmental mid left SFA stenosis with three-vessel runoff.  I performed directional atherectomy followed by drug-coated balloon angioplasty with an excellent angiographic and clinical result.  Claudication on that side resolved and her foot is now warm.     I performed right SFA intervention on her/7/22 with directional atherectomy followed by [facility] using distal protection.  She went home the same day after I performed Perclose of her left common femoral artery.  Her follow-up lower extremity arterial Doppler studies performed on 04/30/2020 revealed ABIs in the mid 0.8 range with patent SFAs bilaterally.  Her claudication has resolved.  She did stop smoking on 03/30/2020.  Unfortunately however her hemoglobin A1c has remained elevated.  Since I saw her 6 months ago she continues to do well.  She did stop smoking 6 months ago.  She denies chest pain shortness of breath or claudication.  Her most recent Doppler studies performed 10/26/2020 revealed normal ABIs bilaterally with some progression of disease in her proximal right SFA which we will continue to monitor noninvasively.   Current Meds  Medication Sig   acetaminophen (TYLENOL) 650 MG CR tablet Take 1,300  mg by mouth every 8 (eight) hours as needed for pain.   aspirin EC 81 MG tablet Take 81 mg by mouth daily.   Azilsartan-Chlorthalidone (EDARBYCLOR) 40-12.5 MG TABS Take 1 tablet by mouth daily.   clopidogrel (PLAVIX) 75 MG  tablet Take 1 tablet (75 mg total) by mouth daily.   diphenhydrAMINE (BENADRYL) 25 mg capsule Take 25 mg by mouth every 6 (six) hours as needed for allergies.   glimepiride (AMARYL) 2 MG tablet Take 2 mg by mouth daily.   ibuprofen (ADVIL) 200 MG tablet Take 400 mg by mouth every 6 (six) hours as needed for moderate pain or headache.   insulin degludec (TRESIBA FLEXTOUCH) 100 UNIT/ML FlexTouch Pen Inject 42 Units into the skin at bedtime.   metFORMIN (GLUCOPHAGE) 1000 MG tablet Take 1,000 mg by mouth 2 (two) times daily with a meal.   Phenylephrine-Acetaminophen 5-325 MG TABS Take 2 tablets by mouth daily as needed (allergy / sinus headaches).   rosuvastatin (CRESTOR) 40 MG tablet Take 1 tablet by mouth once daily   Current Facility-Administered Medications for the 11/03/20 encounter (Office Visit) with Mary Gess, MD  Medication   sodium chloride flush (NS) 0.9 % injection 3 mL   sodium chloride flush (NS) 0.9 % injection 3 mL     Allergies  Allergen Reactions   Codeine Hives   Lisinopril Cough   Sulfonamide Derivatives Hives    Social History   Socioeconomic History   Marital status: Legally Separated    Spouse name: Not on file   Number of children: Not on file   Years of education: Not on file   Highest education level: Not on file  Occupational History   Not on file  Tobacco Use   Smoking status: Former    Packs/day: 1.00    Types: Cigarettes    Quit date: 03/30/2020    Years since quitting: 0.5   Smokeless tobacco: Never  Vaping Use   Vaping Use: Never used  Substance and Sexual Activity   Alcohol use: No   Drug use: No   Sexual activity: Yes    Birth control/protection: Surgical  Other Topics Concern   Not on file  Social History Narrative   Not on file   Social Determinants of Health   Financial Resource Strain: Not on file  Food Insecurity: Not on file  Transportation Needs: Not on file  Physical Activity: Not on file  Stress: Not on file   Social Connections: Not on file  Intimate Partner Violence: Not on file     Review of Systems: General: negative for chills, fever, night sweats or weight changes.  Cardiovascular: negative for chest pain, dyspnea on exertion, edema, orthopnea, palpitations, paroxysmal nocturnal dyspnea or shortness of breath Dermatological: negative for rash Respiratory: negative for cough or wheezing Urologic: negative for hematuria Abdominal: negative for nausea, vomiting, diarrhea, bright red blood per rectum, melena, or hematemesis Neurologic: negative for visual changes, syncope, or dizziness All other systems reviewed and are otherwise negative except as noted above.    Blood pressure (!) 140/56, height 5\' 5"  (1.651 m), weight 182 lb (82.6 kg), SpO2 98 %.  General appearance: alert and no distress Neck: no adenopathy, no JVD, supple, symmetrical, trachea midline, thyroid not enlarged, symmetric, no tenderness/mass/nodules, and soft bilateral carotid bruits Lungs: clear to auscultation bilaterally Heart: regular rate and rhythm, S1, S2 normal, no murmur, click, rub or gallop Extremities: extremities normal, atraumatic, no cyanosis or edema Pulses: 2+ and symmetric Skin: Skin color,  texture, turgor normal. No rashes or lesions Neurologic: Grossly normal  EKG sinus rhythm at 94 with septal Q waves.  I personally reviewed this EKG.  ASSESSMENT AND PLAN:   Claudication in peripheral vascular disease (HCC) History of peripheral arterial disease status post right left SFA intervention by myself back in October 2020 and again the following month.  Because of restenosis I Rie studied her in March and April of this year and revascularize both SFAs.  They remain patent.  She denies claudication.  She walks on a daily basis.  She has stopped smoking.  Her most recent Doppler studies performed 10/26/2020 did show some progression of disease in her proximal right SFA.  We will continue to monitor monitor  her noninvasively on an annual basis.     Mary Gess MD FACP,FACC,FAHA, [facility] 11/03/2020 8:42 AM

## 2020-11-03 NOTE — Assessment & Plan Note (Signed)
History of peripheral arterial disease status post right left SFA intervention by myself back in October 2020 and again the following month.  Because of restenosis I Rie studied her in March and April of this year and revascularize both SFAs.  They remain patent.  She denies claudication.  She walks on a daily basis.  She has stopped smoking.  Her most recent Doppler studies performed 10/26/2020 did show some progression of disease in her proximal right SFA.  We will continue to monitor monitor her noninvasively on an annual basis.

## 2020-11-03 NOTE — Patient Instructions (Signed)
Medication Instructions:  Your physician recommends that you continue on your current medications as directed. Please refer to the Current Medication list given to you today.  *If you need a refill on your cardiac medications before your next appointment, please call your pharmacy*   Testing/Procedures: Your physician has requested that you have a carotid duplex. This test is an ultrasound of the carotid arteries in your neck. It looks at blood flow through these arteries that supply the brain with blood. Allow one hour for this exam. There are no restrictions or special instructions. This procedure is done at [address].   Your physician has requested that you have a lower extremity arterial duplex. This test is an ultrasound of the arteries in the legs. It looks at arterial blood flow in the legs. Allow one hour for Lower Arterial scans. There are no restrictions or special instructions  Your physician has requested that you have an ankle brachial index (ABI). During this test an ultrasound and blood pressure cuff are used to evaluate the arteries that supply the arms and legs with blood. Allow thirty minutes for this exam. There are no restrictions or special instructions.  To be done in Oct. 2023. These procedures are done at [address] [facility].  Follow-Up: At [facility], you and your health needs are our priority.  As part of our continuing mission to provide you with exceptional heart care, we have created designated Provider Care Teams.  These Care Teams include your primary Cardiologist (physician) and Advanced Practice Providers (APPs -  Physician Assistants and Nurse Practitioners) who all work together to provide you with the care you need, when you need it.  We recommend signing up for the patient portal called "MyChart".  Sign up information is provided on this After Visit Summary.  MyChart is used to connect with patients for Virtual Visits (Telemedicine).  Patients are  able to view lab/test results, encounter notes, upcoming appointments, etc.  Non-urgent messages can be sent to your provider as well.   To learn more about what you can do with MyChart, go to [email or web address].    Your next appointment:   12 month(s)  The format for your next appointment:   In Person  Provider:   [name], MD

## 2020-11-16 ENCOUNTER — Other Ambulatory Visit: Payer: Self-pay

## 2020-11-16 ENCOUNTER — Ambulatory Visit (HOSPITAL_COMMUNITY)
Admission: RE | Admit: 2020-11-16 | Discharge: 2020-11-16 | Disposition: A | Discharge status: Home / Self Care | Payer: Managed Care, Other (non HMO) | Source: Ambulatory Visit | Attending: Cardiovascular Disease | Admitting: Cardiovascular Disease

## 2020-11-16 DIAGNOSIS — R0989 Other specified symptoms and signs involving the circulatory and respiratory systems: Secondary | ICD-10-CM | POA: Diagnosis not present

## 2020-11-23 ENCOUNTER — Emergency Department (HOSPITAL_COMMUNITY): Payer: Managed Care, Other (non HMO)

## 2020-11-23 ENCOUNTER — Emergency Department (HOSPITAL_COMMUNITY)
Admission: EM | Admit: 2020-11-23 | Discharge: 2020-11-23 | Disposition: A | Discharge status: Home / Self Care | Payer: Managed Care, Other (non HMO) | Attending: Emergency Medicine | Admitting: Emergency Medicine

## 2020-11-23 DIAGNOSIS — R1010 Upper abdominal pain, unspecified: Secondary | ICD-10-CM | POA: Insufficient documentation

## 2020-11-23 DIAGNOSIS — R1033 Periumbilical pain: Secondary | ICD-10-CM | POA: Diagnosis present

## 2020-11-23 DIAGNOSIS — Z7984 Long term (current) use of oral hypoglycemic drugs: Secondary | ICD-10-CM | POA: Insufficient documentation

## 2020-11-23 DIAGNOSIS — E785 Hyperlipidemia, unspecified: Secondary | ICD-10-CM | POA: Insufficient documentation

## 2020-11-23 DIAGNOSIS — Z79899 Other long term (current) drug therapy: Secondary | ICD-10-CM | POA: Diagnosis not present

## 2020-11-23 DIAGNOSIS — E1169 Type 2 diabetes mellitus with other specified complication: Secondary | ICD-10-CM | POA: Insufficient documentation

## 2020-11-23 DIAGNOSIS — I10 Essential (primary) hypertension: Secondary | ICD-10-CM | POA: Insufficient documentation

## 2020-11-23 DIAGNOSIS — Z794 Long term (current) use of insulin: Secondary | ICD-10-CM | POA: Insufficient documentation

## 2020-11-23 DIAGNOSIS — Z87891 Personal history of nicotine dependence: Secondary | ICD-10-CM | POA: Insufficient documentation

## 2020-11-23 DIAGNOSIS — Z7982 Long term (current) use of aspirin: Secondary | ICD-10-CM | POA: Diagnosis not present

## 2020-11-23 DIAGNOSIS — R110 Nausea: Secondary | ICD-10-CM | POA: Diagnosis not present

## 2020-11-23 LAB — URINALYSIS, ROUTINE W REFLEX MICROSCOPIC
Bilirubin Urine: NEGATIVE
Glucose, UA: NEGATIVE mg/dL
Hgb urine dipstick: NEGATIVE
Ketones, ur: NEGATIVE mg/dL
Leukocytes,Ua: NEGATIVE
Nitrite: NEGATIVE
Protein, ur: NEGATIVE mg/dL
Specific Gravity, Urine: 1.01 (ref 1.005–1.030)
pH: 5 (ref 5.0–8.0)

## 2020-11-23 LAB — CBC WITH DIFFERENTIAL/PLATELET
Abs Immature Granulocytes: 0.05 10*3/uL (ref 0.00–0.07)
Basophils Absolute: 0.1 10*3/uL (ref 0.0–0.1)
Basophils Relative: 1 %
Eosinophils Absolute: 0.1 10*3/uL (ref 0.0–0.5)
Eosinophils Relative: 1 %
HCT: 36.6 % (ref 36.0–46.0)
Hemoglobin: 12.4 g/dL (ref 12.0–15.0)
Immature Granulocytes: 0 %
Lymphocytes Relative: 19 %
Lymphs Abs: 2.4 10*3/uL (ref 0.7–4.0)
MCH: 28.3 pg (ref 26.0–34.0)
MCHC: 33.9 g/dL (ref 30.0–36.0)
MCV: 83.6 fL (ref 80.0–100.0)
Monocytes Absolute: 0.6 10*3/uL (ref 0.1–1.0)
Monocytes Relative: 5 %
Neutro Abs: 9.5 10*3/uL — ABNORMAL HIGH (ref 1.7–7.7)
Neutrophils Relative %: 74 %
Platelets: 326 10*3/uL (ref 150–400)
RBC: 4.38 MIL/uL (ref 3.87–5.11)
RDW: 15.7 % — ABNORMAL HIGH (ref 11.5–15.5)
WBC: 12.6 10*3/uL — ABNORMAL HIGH (ref 4.0–10.5)
nRBC: 0 % (ref 0.0–0.2)

## 2020-11-23 LAB — COMPREHENSIVE METABOLIC PANEL
ALT: 12 U/L (ref 0–44)
AST: 17 U/L (ref 15–41)
Albumin: 4 g/dL (ref 3.5–5.0)
Alkaline Phosphatase: 147 U/L — ABNORMAL HIGH (ref 38–126)
Anion gap: 8 (ref 5–15)
BUN: 12 mg/dL (ref 6–20)
CO2: 27 mmol/L (ref 22–32)
Calcium: 9.2 mg/dL (ref 8.9–10.3)
Chloride: 97 mmol/L — ABNORMAL LOW (ref 98–111)
Creatinine, Ser: 1 mg/dL (ref 0.44–1.00)
GFR, Estimated: >60 mL/min (ref >60)
Glucose, Bld: 132 mg/dL — ABNORMAL HIGH (ref 70–99)
Potassium: 4 mmol/L (ref 3.5–5.1)
Sodium: 132 mmol/L — ABNORMAL LOW (ref 135–145)
Total Bilirubin: 0.4 mg/dL (ref 0.3–1.2)
Total Protein: 7.9 g/dL (ref 6.5–8.1)

## 2020-11-23 LAB — LIPASE, BLOOD: Lipase: 63 U/L — ABNORMAL HIGH (ref 11–51)

## 2020-11-23 MED ORDER — IOHEXOL 350 MG/ML SOLN
100.0000 mL | Freq: Once | INTRAVENOUS | Status: AC | PRN
  Administered 2020-11-23: 80 mL via INTRAVENOUS

## 2020-11-23 MED ORDER — MORPHINE SULFATE (PF) 4 MG/ML IV SOLN: 4.0000 mg | Freq: Once | INTRAVENOUS | Status: DC

## 2020-11-23 MED ORDER — ACETAMINOPHEN 325 MG PO TABS
650.0000 mg | ORAL_TABLET | Freq: Once | ORAL | Status: AC
  Administered 2020-11-23: 650 mg via ORAL
  Filled 2020-11-23: qty 2

## 2020-11-23 MED ORDER — SODIUM CHLORIDE 0.9 % IV BOLUS
1000.0000 mL | Freq: Once | INTRAVENOUS | Status: AC
  Administered 2020-11-23: 1000 mL via INTRAVENOUS

## 2020-11-23 MED ORDER — ONDANSETRON HCL 4 MG/2ML IJ SOLN: 4.0000 mg | Freq: Once | INTRAMUSCULAR | Status: DC

## 2020-11-23 MED ORDER — ONDANSETRON 4 MG PO TBDP: 4.0000 mg | ORAL_TABLET | Freq: Three times a day (TID) | ORAL | 0 refills | Status: DC | PRN

## 2020-11-23 NOTE — ED Provider Notes (Signed)
[facility] DEPT Provider Note   CSN: [id number] Arrival date & time: 11/23/20  5277     History Chief Complaint  Patient presents with   Abdominal Pain    Mary Campbell is a 55 y.o. female with a past medical history significant for diabetes, hyperlipidemia, hypertension, PAD, and tobacco use who presents to the ED due to periumbilical/upper abdominal pain x2 days.  Abdominal pain associate with nausea.  No vomiting or diarrhea.  Normal bowel movements.  Patient denies vaginal and urinary symptoms.  Patient is having previous cholecystectomy and hysterectomy however, no further abdominal operations.  Patient states pain radiates to upper back.  No chest pain or shortness of breath.  Denies lower extremity edema.  Patient also states she has been belching more than normal.  She has been taking Pepto-Bismol with relief in her symptoms.  Denies alcohol and chronic NSAID use.  Prior to arrival.  No aggravating or alleviating factors.  History obtained from patient and past medical records. No interpreter used during encounter.       Past Medical History:  Diagnosis Date   Chronic back pain    Diabetes mellitus    Hyperlipidemia    Hypertension    PAD (peripheral artery disease) ([place])    a. PV angio 10/2018 - bilateral SFA disease s/p L intervention.   PAD (peripheral artery disease) ([place])    Tobacco abuse     Patient Active Problem List   Diagnosis Date Noted   Claudication in peripheral vascular disease ([place]) 10/04/2018   Essential hypertension 10/04/2018   Hyperlipidemia 10/04/2018   Tobacco abuse 10/04/2018   Family history of heart disease 10/04/2018    Past Surgical History:  Procedure Laterality Date   ABDOMINAL AORTOGRAM W/LOWER EXTREMITY Right 10/18/2018   Procedure: ABDOMINAL AORTOGRAM W/LOWER EXTREMITY;  Surgeon: Lorretta Harp, MD;  Location: [facility];  Service: Cardiovascular;  Laterality: Right;   ABDOMINAL AORTOGRAM  W/LOWER EXTREMITY N/A 04/02/2020   Procedure: ABDOMINAL AORTOGRAM W/LOWER EXTREMITY;  Surgeon: Lorretta Harp, MD;  Location: [facility];  Service: Cardiovascular;  Laterality: N/A;   ABDOMINAL AORTOGRAM W/LOWER EXTREMITY N/A 04/23/2020   Procedure: ABDOMINAL AORTOGRAM W/LOWER EXTREMITY;  Surgeon: Lorretta Harp, MD;  Location: [facility];  Service: Cardiovascular;  Laterality: N/A;   ABDOMINAL HYSTERECTOMY     CHOLECYSTECTOMY     PERIPHERAL VASCULAR ATHERECTOMY Left 10/18/2018   Procedure: PERIPHERAL VASCULAR ATHERECTOMY;  Surgeon: Lorretta Harp, MD;  Location: [facility];  Service: Cardiovascular;  Laterality: Left;  SFA   PERIPHERAL VASCULAR ATHERECTOMY Right 11/22/2018   Procedure: PERIPHERAL VASCULAR ATHERECTOMY;  Surgeon: Lorretta Harp, MD;  Location: [facility];  Service: Cardiovascular;  Laterality: Right;  SFA   PERIPHERAL VASCULAR ATHERECTOMY  04/23/2020   Procedure: PERIPHERAL VASCULAR ATHERECTOMY;  Surgeon: Lorretta Harp, MD;  Location: [facility];  Service: Cardiovascular;;   PERIPHERAL VASCULAR BALLOON ANGIOPLASTY Right 11/22/2018   Procedure: PERIPHERAL VASCULAR BALLOON ANGIOPLASTY;  Surgeon: Lorretta Harp, MD;  Location: [facility];  Service: Cardiovascular;  Laterality: Right;  SFA   PERIPHERAL VASCULAR BALLOON ANGIOPLASTY  04/23/2020   Procedure: PERIPHERAL VASCULAR BALLOON ANGIOPLASTY;  Surgeon: Lorretta Harp, MD;  Location: [facility];  Service: Cardiovascular;;   PERIPHERAL VASCULAR INTERVENTION Left 04/02/2020   Procedure: PERIPHERAL VASCULAR INTERVENTION;  Surgeon: Lorretta Harp, MD;  Location: [facility];  Service: Cardiovascular;  Laterality: Left;  OB History   No obstetric history on file.     Family History  Problem Relation Age of Onset   Diabetes Mother    Hypertension Mother    Hyperlipidemia Mother    Heart attack Father    Heart disease Father    Hypertension Father     Hyperlipidemia Father     Social History   Tobacco Use   Smoking status: Former    Packs/day: 1.00    Types: Cigarettes    Quit date: 03/30/2020    Years since quitting: 0.6   Smokeless tobacco: Never  Vaping Use   Vaping Use: Never used  Substance Use Topics   Alcohol use: No   Drug use: No    Home Medications Prior to Admission medications   Medication Sig Start Date End Date Taking? Authorizing Provider  ondansetron (ZOFRAN ODT) 4 MG disintegrating tablet Take 1 tablet (4 mg total) by mouth every 8 (eight) hours as needed for nausea or vomiting. 11/23/20  Yes Jolane Bankhead, Druscilla Brownie, PA-C  acetaminophen (TYLENOL) 650 MG CR tablet Take 1,300 mg by mouth every 8 (eight) hours as needed for pain.    [provider]  aspirin EC 81 MG tablet Take 81 mg by mouth daily.    [provider]  Azilsartan-Chlorthalidone (EDARBYCLOR) 40-12.5 MG TABS Take 1 tablet by mouth daily.    [provider]  clopidogrel (PLAVIX) 75 MG tablet Take 1 tablet (75 mg total) by mouth daily. 04/02/20 04/02/21  Leanor Kail, PA  diphenhydrAMINE (BENADRYL) 25 mg capsule Take 25 mg by mouth every 6 (six) hours as needed for allergies.    [provider]  fluticasone (FLONASE) 50 MCG/ACT nasal spray Place 1 spray into both nostrils 2 (two) times daily. Patient not taking: Reported on 11/03/2020 10/20/20   [provider]  glimepiride (AMARYL) 2 MG tablet Take 2 mg by mouth daily. 02/07/20   [provider]  ibuprofen (ADVIL) 200 MG tablet Take 400 mg by mouth every 6 (six) hours as needed for moderate pain or headache.    [provider]  insulin degludec (TRESIBA FLEXTOUCH) 100 UNIT/ML FlexTouch Pen Inject 42 Units into the skin at bedtime.    [provider]  Melatonin 10 MG CAPS Take 10 mg by mouth at bedtime as needed (sleep). Patient not taking: Reported on 11/03/2020    [provider]  metFORMIN (GLUCOPHAGE) 1000 MG tablet  Take 1,000 mg by mouth 2 (two) times daily with a meal. 08/04/18   [provider]  methocarbamol (ROBAXIN) 500 MG tablet Take 500 mg by mouth every 6 (six) hours as needed. Patient not taking: Reported on 11/03/2020 08/21/20   [provider]  nicotine polacrilex (COMMIT) 4 MG lozenge Take 4 mg by mouth daily as needed for smoking cessation. Patient not taking: Reported on 11/03/2020    [provider]  Phenylephrine-Acetaminophen 5-325 MG TABS Take 2 tablets by mouth daily as needed (allergy / sinus headaches).    [provider]  rosuvastatin (CRESTOR) 40 MG tablet Take 1 tablet by mouth once daily 04/20/20   Lorretta Harp, MD    Allergies    Codeine, Lisinopril, and Sulfonamide derivatives  Review of Systems   Review of Systems  Constitutional:  Negative for chills and fever.  Respiratory:  Negative for shortness of breath.   Cardiovascular:  Negative for chest pain.  Gastrointestinal:  Positive for abdominal pain and nausea. Negative for diarrhea and vomiting.  Genitourinary:  Negative  for dysuria and vaginal discharge.  All other systems reviewed and are negative.  Physical Exam Updated Vital Signs BP 123/84   Pulse 96   Temp 98.9 F (37.2 C) (Oral)   Resp 16   Ht '5\' 5"'  (1.651 m)   Wt 84.8 kg   SpO2 99%   BMI 31.12 kg/m   Physical Exam Vitals and nursing note reviewed.  Constitutional:      General: She is not in acute distress.    Appearance: She is not ill-appearing.  HENT:     Head: Normocephalic.  Eyes:     Pupils: Pupils are equal, round, and reactive to light.  Cardiovascular:     Rate and Rhythm: Normal rate and regular rhythm.     Pulses: Normal pulses.     Heart sounds: Normal heart sounds. No murmur heard.   No friction rub. No gallop.  Pulmonary:     Effort: Pulmonary effort is normal.     Breath sounds: Normal breath sounds.  Abdominal:     General: Abdomen is flat. There is no distension.     Palpations:  Abdomen is soft.     Tenderness: There is abdominal tenderness. There is no guarding or rebound.     Comments: Upper abdominal tenderness  Musculoskeletal:        General: Normal range of motion.     Cervical back: Neck supple.  Skin:    General: Skin is warm and dry.  Neurological:     General: No focal deficit present.     Mental Status: She is alert.  Psychiatric:        Mood and Affect: Mood normal.        Behavior: Behavior normal.    ED Results / Procedures / Treatments   Labs (all labs ordered are listed, but only abnormal results are displayed) Labs Reviewed  CBC WITH DIFFERENTIAL/PLATELET - Abnormal; Notable for the following components:      Result Value   WBC 12.6 (*)    RDW 15.7 (*)    Neutro Abs 9.5 (*)    All other components within normal limits  COMPREHENSIVE METABOLIC PANEL - Abnormal; Notable for the following components:   Sodium 132 (*)    Chloride 97 (*)    Glucose, Bld 132 (*)    Alkaline Phosphatase 147 (*)    All other components within normal limits  LIPASE, BLOOD - Abnormal; Notable for the following components:   Lipase 63 (*)    All other components within normal limits  URINALYSIS, ROUTINE W REFLEX MICROSCOPIC    EKG EKG Interpretation  Date/Time:  Monday November 23 2020 11:15:22 EST Ventricular Rate:  84 PR Interval:  138 QRS Duration: 86 QT Interval:  378 QTC Calculation: 447 R Axis:   87 Text Interpretation: Sinus rhythm Consider left atrial enlargement Low voltage, precordial leads Anteroseptal infarct, old No significant change since last tracing Confirmed by Blanchie Dessert [phone]) on 11/23/2020 11:30:27 AM  Radiology CT ABDOMEN PELVIS W CONTRAST  Result Date: 11/23/2020 CLINICAL DATA:  Abdominal pain, acute, nonlocalized EXAM: CT ABDOMEN AND PELVIS WITH CONTRAST TECHNIQUE: Multidetector CT imaging of the abdomen and pelvis was performed using the standard protocol following bolus administration of intravenous contrast.  CONTRAST:  38m OMNIPAQUE IOHEXOL 350 MG/ML SOLN COMPARISON:  None. FINDINGS: Lower chest: Mosaic attenuation, which may reflect small airways disease. Hepatobiliary: No focal liver abnormality is seen. Status post cholecystectomy. No biliary dilatation. Pancreas: Unremarkable. Spleen: Unremarkable. Adrenals/Urinary Tract: Adrenals are unremarkable. Kidneys  are unremarkable. Bladder is unremarkable for degree of distension. Stomach/Bowel: Stomach is within normal limits. Bowel is normal in caliber. Normal appendix. Distal colonic diverticulosis. Vascular/Lymphatic: Aortic atherosclerosis. No enlarged lymph nodes. Reproductive: Status post hysterectomy. No adnexal masses. Other: No ascites.  No acute abnormality of the abdominal wall. Musculoskeletal: Degenerative changes of the included spine. No acute osseous abnormality. IMPRESSION: No acute abnormality. Colonic diverticulosis. Aortic atherosclerosis. Electronically Signed   By: Macy Mis M.D.   On: 11/23/2020 14:34    Procedures Procedures   Medications Ordered in ED Medications  iohexol (OMNIPAQUE) 350 MG/ML injection 100 mL (80 mLs Intravenous Contrast Given 11/23/20 1409)  sodium chloride 0.9 % bolus 1,000 mL (1,000 mLs Intravenous New Bag/Given 11/23/20 2100)  acetaminophen (TYLENOL) tablet 650 mg (650 mg Oral Given 11/23/20 2059)    ED Course  I have reviewed the triage vital signs and the nursing notes.  Pertinent labs & imaging results that were available during my care of the patient were reviewed by me and considered in my medical decision making (see chart for details).  Clinical Course as of 11/23/20 2115  Mon Nov 23, 2020  2048 Lipase(!): 63 [CA]  2048 WBC(!): 12.6 [CA]  2048 Glucose(!): 132 [CA]  2048 Alkaline Phosphatase(!): 147 [CA]    Clinical Course User Index [CA] Suzy Bouchard, PA-C   MDM Rules/Calculators/A&P                          55 year old female presents to the ED due to upper  abdominal/periumbilical pain x2 days associated with nausea.  No vomiting or diarrhea.  No fever or chills.  No vaginal or urinary symptoms.  Upon arrival, vitals all within normal limits.  Patient nontoxic-appearing.  Unfortunately, patient waited over 11 hours prior to my initial evaluation due to long wait times.  CT abdomen and abdominal labs ordered at triage.  IV fluids given.  Patient declined IV nausea and pain medication.  Patient requesting Tylenol. Patient also declined GI cocktail, carafate, and Protonix.   CBC significant for mild leukocytosis at 12.6.  Normal hemoglobin.  CMP significant for mild hyponatremia 132.  Hyperglycemia 132.  Elevated alk phos at 147. Normal AST/ALT. UA negative for signs infection. CT abdomen negative for any acute abnormalities. EKG demonstrates NSR with no major changes from previous EKG. Low suspicion for atypical ACS.   Offered further work-up and treatment plans; however patient states she feels better and prefers to go home. Symptoms could be related to gastritis vs. GERD vs. Early pancreatitis. Advised patient to follow-up with PCP if symptoms do not improve over the next week. Strict ED precautions discussed with patient. Patient states understanding and agrees to plan. Patient discharged home in no acute distress and stable vitals  Discussed with Dr. Francia Greaves who agrees with assessment and plan.  Final Clinical Impression(s) / ED Diagnoses Final diagnoses:  Pain of upper abdomen    Rx / DC Orders ED Discharge Orders          Ordered    ondansetron (ZOFRAN ODT) 4 MG disintegrating tablet  Every 8 hours PRN        11/23/20 2114             Karie Kirks 11/23/20 2115    Valarie Merino, MD 11/25/20 [phone]

## 2020-11-23 NOTE — Discharge Instructions (Addendum)
It was a pleasure taking care of you today.  As discussed, your CT scan did not show any abnormalities.  Your lipase was mildly elevated which can be indicative of early pancreatitis.  You may take over-the-counter Tylenol as needed for pain.  Continue to stay hydrated.  Follow-up with PCP if symptoms do not improve over the next week.  Return to the ER for any worsening symptoms.  I am sending you home with nausea medication. Take as needed.

## 2020-11-23 NOTE — ED Provider Notes (Signed)
Emergency Medicine Provider Triage Evaluation Note  Mary Campbell , a 55 y.o. female  was evaluated in triage.  Pt complains of upper/periumbilical abd pain.  Nausea without vomiting.  Normal bowel movements  Review of Systems  Positive: Burping and feeling indigestion. Negative: No fever, chest pain or shortness of breath  Physical Exam  BP 125/69 (BP Location: Left Arm)   Pulse 84   Temp 98.9 F (37.2 C) (Oral)   Resp 16   SpO2 99%  Gen:   Awake, no distress   Resp:  Normal effort  MSK:   Moves extremities without difficulty  Other:  Mid abdominal pain in the right upper quadrant,epigastric/periumbilical area.  No cva tenderness.  Medical Decision Making  Medically screening exam initiated at 10:52 AM.  Appropriate orders placed.  Kienna Evon was informed that the remainder of the evaluation will be completed by another provider, this initial triage assessment does not replace that evaluation, and the importance of remaining in the ED until their evaluation is complete.     Gwyneth Sprout, MD 11/23/20 1057

## 2020-11-23 NOTE — ED Triage Notes (Signed)
Pt states she has abdominal pain that started 2 days ago. States the pain starts in her abdomen and goes around to her back. States this pain comes and goes. Pt reports nausea but denies vomiting/diarrhea

## 2020-11-23 NOTE — Progress Notes (Signed)
Patient stated that after injection, she felt as though she was unable to breath.  Dr. Anitra Lauth, and Dr. Karene Fry evaluated the patient.  Patient denied any itching or throat swelling.  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  Patient began to state she was feeling better and thought it was more of a panic attack.  Dr. Karene Fry stated it would be ok to have patient have a seat back in the lobby.  Patient advised that if she began to feel bad a gain to let the ED staff know.  Patient stated she understood

## 2020-12-16 ENCOUNTER — Other Ambulatory Visit (HOSPITAL_COMMUNITY): Payer: Self-pay | Admitting: Cardiovascular Disease

## 2020-12-16 DIAGNOSIS — I739 Peripheral vascular disease, unspecified: Secondary | ICD-10-CM

## 2020-12-16 DIAGNOSIS — Z9862 Peripheral vascular angioplasty status: Secondary | ICD-10-CM

## 2021-04-08 ENCOUNTER — Other Ambulatory Visit: Payer: Self-pay | Admitting: Physician Assistant

## 2021-04-21 ENCOUNTER — Other Ambulatory Visit: Payer: Self-pay | Admitting: Physician Assistant

## 2021-04-21 ENCOUNTER — Encounter: Payer: Self-pay | Admitting: Cardiovascular Disease

## 2021-04-22 MED ORDER — ROSUVASTATIN CALCIUM 40 MG PO TABS: 40.0000 mg | ORAL_TABLET | Freq: Every day | ORAL | 1 refills | Status: DC

## 2021-04-22 MED ORDER — CLOPIDOGREL BISULFATE 75 MG PO TABS: 75.0000 mg | ORAL_TABLET | Freq: Every day | ORAL | 1 refills | Status: DC

## 2021-06-23 NOTE — Progress Notes (Signed)
Cardiology Office Note:   Date:  06/24/2021  NAME:  Mary Campbell    MRN: [id number] DOB:  1965/02/03   PCP:  Jilda Panda, MD  Cardiologist:  Evalina Field, MD  Electrophysiologist:  None   Referring MD: Jilda Panda, MD   Chief Complaint  Patient presents with   Follow-up        History of Present Illness:   Mary Campbell is a 56 y.o. female with a hx of PAD, DM, HTN, obesity who presents for follow-up.  She presents for follow-up.  She is still smoking 3 cigarettes/day.  Blood pressure slightly elevated with not take her medication.  Continues to work with Dr. Alvester Chou for PAD.  Has had some progression of her right leg.  Denies any chest pain or trouble breathing.  Most recent A1c around 7.8.  She will have repeat lab work by her primary care physician next month.  Need to get her diabetes under better.  She needs to lose weight.  Also needs to get her cholesterol to control.  She will forward me the results of her primary care physician when she has these labs done next month.  Problem List 1. PAD -angioplasty L SFA 10/19/2018 and 04/02/2020 -R SFA angioplasty  2. DM -A1c 7.8 3. HLD -T chol 112, HDL 29, LDL 56, TG 154 4. Tobacco abuse 5. Obesity  Past Medical History: Past Medical History:  Diagnosis Date   Chronic back pain    Diabetes mellitus    Hyperlipidemia    Hypertension    PAD (peripheral artery disease) ([place])    a. PV angio 10/2018 - bilateral SFA disease s/p L intervention.   PAD (peripheral artery disease) ([place])    Tobacco abuse     Past Surgical History: Past Surgical History:  Procedure Laterality Date   ABDOMINAL AORTOGRAM W/LOWER EXTREMITY Right 10/18/2018   Procedure: ABDOMINAL AORTOGRAM W/LOWER EXTREMITY;  Surgeon: Lorretta Harp, MD;  Location: [facility];  Service: Cardiovascular;  Laterality: Right;   ABDOMINAL AORTOGRAM W/LOWER EXTREMITY N/A 04/02/2020   Procedure: ABDOMINAL AORTOGRAM W/LOWER EXTREMITY;  Surgeon: Lorretta Harp, MD;   Location: [facility];  Service: Cardiovascular;  Laterality: N/A;   ABDOMINAL AORTOGRAM W/LOWER EXTREMITY N/A 04/23/2020   Procedure: ABDOMINAL AORTOGRAM W/LOWER EXTREMITY;  Surgeon: Lorretta Harp, MD;  Location: [facility];  Service: Cardiovascular;  Laterality: N/A;   ABDOMINAL HYSTERECTOMY     CHOLECYSTECTOMY     PERIPHERAL VASCULAR ATHERECTOMY Left 10/18/2018   Procedure: PERIPHERAL VASCULAR ATHERECTOMY;  Surgeon: Lorretta Harp, MD;  Location: [facility];  Service: Cardiovascular;  Laterality: Left;  SFA   PERIPHERAL VASCULAR ATHERECTOMY Right 11/22/2018   Procedure: PERIPHERAL VASCULAR ATHERECTOMY;  Surgeon: Lorretta Harp, MD;  Location: [facility];  Service: Cardiovascular;  Laterality: Right;  SFA   PERIPHERAL VASCULAR ATHERECTOMY  04/23/2020   Procedure: PERIPHERAL VASCULAR ATHERECTOMY;  Surgeon: Lorretta Harp, MD;  Location: [facility];  Service: Cardiovascular;;   PERIPHERAL VASCULAR BALLOON ANGIOPLASTY Right 11/22/2018   Procedure: PERIPHERAL VASCULAR BALLOON ANGIOPLASTY;  Surgeon: Lorretta Harp, MD;  Location: [facility];  Service: Cardiovascular;  Laterality: Right;  SFA   PERIPHERAL VASCULAR BALLOON ANGIOPLASTY  04/23/2020   Procedure: PERIPHERAL VASCULAR BALLOON ANGIOPLASTY;  Surgeon: Lorretta Harp, MD;  Location: [facility];  Service: Cardiovascular;;   PERIPHERAL VASCULAR INTERVENTION Left 04/02/2020   Procedure: PERIPHERAL VASCULAR INTERVENTION;  Surgeon: Lorretta Harp, MD;  Location: [facility];  Service: Cardiovascular;  Laterality: Left;    Current Medications: Current Meds  Medication Sig   acetaminophen (TYLENOL) 650 MG CR tablet Take 1,300 mg by mouth every 8 (eight) hours as needed for pain.   aspirin EC 81 MG tablet Take 81 mg by mouth daily.   Azilsartan-Chlorthalidone (EDARBYCLOR) 40-12.5 MG TABS Take 1 tablet by mouth daily.   clopidogrel (PLAVIX) 75 MG tablet Take 1 tablet (75 mg  total) by mouth daily.   diphenhydrAMINE (BENADRYL) 25 mg capsule Take 25 mg by mouth every 6 (six) hours as needed for allergies.   fluticasone (FLONASE) 50 MCG/ACT nasal spray Place 1 spray into both nostrils 2 (two) times daily.   glimepiride (AMARYL) 2 MG tablet Take 2 mg by mouth daily.   ibuprofen (ADVIL) 200 MG tablet Take 400 mg by mouth every 6 (six) hours as needed for moderate pain or headache.   insulin degludec (TRESIBA FLEXTOUCH) 100 UNIT/ML FlexTouch Pen Inject 42 Units into the skin at bedtime.   metFORMIN (GLUCOPHAGE) 1000 MG tablet Take 1,000 mg by mouth 2 (two) times daily with a meal.   nicotine polacrilex (COMMIT) 4 MG lozenge Take 4 mg by mouth daily as needed for smoking cessation.   Phenylephrine-Acetaminophen 5-325 MG TABS Take 2 tablets by mouth daily as needed (allergy / sinus headaches).   rosuvastatin (CRESTOR) 40 MG tablet Take 1 tablet (40 mg total) by mouth daily.   Current Facility-Administered Medications for the 06/24/21 encounter (Office Visit) with Geralynn Rile, MD  Medication   sodium chloride flush (NS) 0.9 % injection 3 mL   sodium chloride flush (NS) 0.9 % injection 3 mL     Allergies:    Codeine, Lisinopril, and Sulfonamide derivatives   Social History: Social History   Socioeconomic History   Marital status: Legally Separated    Spouse name: Not on file   Number of children: Not on file   Years of education: Not on file   Highest education level: Not on file  Occupational History   Not on file  Tobacco Use   Smoking status: Former    Packs/day: 1.00    Types: Cigarettes    Quit date: 03/30/2020    Years since quitting: 1.2   Smokeless tobacco: Never  Vaping Use   Vaping Use: Never used  Substance and Sexual Activity   Alcohol use: No   Drug use: No   Sexual activity: Yes    Birth control/protection: Surgical  Other Topics Concern   Not on file  Social History Narrative   Not on file   Social Determinants of Health    Financial Resource Strain: Not on file  Food Insecurity: Not on file  Transportation Needs: Not on file  Physical Activity: Not on file  Stress: Not on file  Social Connections: Not on file     Family History: The patient's family history includes Diabetes in her mother; Heart attack in her father; Heart disease in her father; Hyperlipidemia in her father and mother; Hypertension in her father and mother.  ROS:   All other ROS reviewed and negative. Pertinent positives noted in the HPI.     EKGs/Labs/Other Studies Reviewed:   The following studies were personally reviewed by me today:  Recent Labs: 11/23/2020: ALT 12; BUN 12; Creatinine, Ser 1.00; Hemoglobin 12.4; Platelets 326; Potassium 4.0; Sodium 132   Recent Lipid Panel    Component Value Date/Time   CHOL 118 03/11/2020 1005  TRIG 257 (H) 03/11/2020 1005   HDL 27 (L) 03/11/2020 1005   CHOLHDL 4.4 03/11/2020 1005   LDLCALC 50 03/11/2020 1005    Physical Exam:   VS:  BP 140/70   Pulse 79   Ht 5\' 4"  (1.626 m)   Wt 178 lb 12.8 oz (81.1 kg)   SpO2 97%   BMI 30.69 kg/m    Wt Readings from Last 3 Encounters:  06/24/21 178 lb 12.8 oz (81.1 kg)  11/23/20 187 lb (84.8 kg)  11/03/20 182 lb (82.6 kg)    General: Well nourished, well developed, in no acute distress Head: Atraumatic, normal size  Eyes: PEERLA, EOMI  Neck: Supple, no JVD Endocrine: No thryomegaly Cardiac: Normal S1, S2; RRR; no murmurs, rubs, or gallops Lungs: Clear to auscultation bilaterally, no wheezing, rhonchi or rales  Abd: Soft, nontender, no hepatomegaly  Ext: No edema, diminished pulses bilaterally Musculoskeletal: No deformities, BUE and BLE strength normal and equal Skin: Warm and dry, no rashes   Neuro: Alert and oriented to person, place, time, and situation, CNII-XII grossly intact, no focal deficits  Psych: Normal mood and affect   ASSESSMENT:   Mary Campbell is a 56 y.o. female who presents for the following: 1. PAD (peripheral  artery disease) ([facility])   2. Essential hypertension   3. Mixed hyperlipidemia   4. Tobacco abuse     PLAN:   1. PAD (peripheral artery disease) ([facility]) -Multiple interventions to the lower extremities.  Continues to smoke.  Has had some progression in the right lower extremity.  No claudication symptoms.  Does describe some arthritis in the left leg.  She will continue to follow with Dr. Alvester Chou.  For now continue aspirin and Plavix.  She is on Crestor 40 mg daily.  Needs to get her diabetes better.  She will forward me the results of her cholesterol profile next month.  No symptoms of angina.  Normal stress test and echo in the past.  2. Essential hypertension -No change to BP medications.  Slightly elevated today but did not take medication.  3. Mixed hyperlipidemia -Continue Crestor 40 mg daily.  Repeat labs will be done next month by her primary care physician.  She will forward me these.  4. Tobacco abuse -Still smoking 3 cigarettes daily.  3 minutes of smoking cessation counseling was provided in office.  Disposition: Return in about 1 year (around 06/25/2022).  Medication Adjustments/Labs and Tests Ordered: Current medicines are reviewed at length with the patient today.  Concerns regarding medicines are outlined above.  No orders of the defined types were placed in this encounter.  No orders of the defined types were placed in this encounter.   Patient Instructions  Medication Instructions:  The current medical regimen is effective;  continue present plan and medications.  *If you need a refill on your cardiac medications before your next appointment, please call your pharmacy*   Follow-Up: At [facility], you and your health needs are our priority.  As part of our continuing mission to provide you with exceptional heart care, we have created designated Provider Care Teams.  These Care Teams include your primary Cardiologist (physician) and Advanced Practice Providers (APPs -   Physician Assistants and Nurse Practitioners) who all work together to provide you with the care you need, when you need it.  We recommend signing up for the patient portal called "MyChart".  Sign up information is provided on this After Visit Summary.  MyChart is used to connect with patients  for Virtual Visits (Telemedicine).  Patients are able to view lab/test results, encounter notes, upcoming appointments, etc.  Non-urgent messages can be sent to your provider as well.   To learn more about what you can do with MyChart, go to [email or web address].    Your next appointment:   12 month(s)  The format for your next appointment:   In Person  Provider:   Evalina Field, MD              Time Spent with Patient: I have spent a total of 25 minutes with patient reviewing hospital notes, telemetry, EKGs, labs and examining the patient as well as establishing an assessment and plan that was discussed with the patient.  > 50% of time was spent in direct patient care.  Signed, Addison Naegeli. Audie Box, MD, [facility]  [address], [place], [place] [address] [phone]  06/24/2021 9:35 AM

## 2021-06-24 ENCOUNTER — Ambulatory Visit (INDEPENDENT_AMBULATORY_CARE_PROVIDER_SITE_OTHER): Payer: Managed Care, Other (non HMO) | Admitting: Cardiovascular Disease

## 2021-06-24 ENCOUNTER — Encounter: Payer: Self-pay | Admitting: Cardiovascular Disease

## 2021-06-24 VITALS — BP 140/70 | HR 79 | Ht 64.0 in | Wt 178.8 lb

## 2021-06-24 DIAGNOSIS — I10 Essential (primary) hypertension: Secondary | ICD-10-CM | POA: Diagnosis not present

## 2021-06-24 DIAGNOSIS — I739 Peripheral vascular disease, unspecified: Secondary | ICD-10-CM | POA: Diagnosis not present

## 2021-06-24 DIAGNOSIS — E782 Mixed hyperlipidemia: Secondary | ICD-10-CM | POA: Diagnosis not present

## 2021-06-24 DIAGNOSIS — Z720 Tobacco use: Secondary | ICD-10-CM | POA: Diagnosis not present

## 2021-06-24 NOTE — Patient Instructions (Signed)
Medication Instructions:  °The current medical regimen is effective;  continue present plan and medications. ° °*If you need a refill on your cardiac medications before your next appointment, please call your pharmacy* ° ° °Follow-Up: °At [facility], you and your health needs are our priority.  As part of our continuing mission to provide you with exceptional heart care, we have created designated Provider Care Teams.  These Care Teams include your primary Cardiologist (physician) and Advanced Practice Providers (APPs -  Physician Assistants and Nurse Practitioners) who all work together to provide you with the care you need, when you need it. ° °We recommend signing up for the patient portal called "MyChart".  Sign up information is provided on this After Visit Summary.  MyChart is used to connect with patients for Virtual Visits (Telemedicine).  Patients are able to view lab/test results, encounter notes, upcoming appointments, etc.  Non-urgent messages can be sent to your provider as well.   °To learn more about what you can do with MyChart, go to [email or web address].   ° °Your next appointment:   °12 month(s) ° °The format for your next appointment:   °In Person ° °Provider:   °Saco T O'Neal, MD   ° ° ° °

## 2021-06-28 ENCOUNTER — Ambulatory Visit
Admission: EM | Admit: 2021-06-28 | Discharge: 2021-06-28 | Disposition: A | Discharge status: Home / Self Care | Payer: Managed Care, Other (non HMO) | Attending: Internal Medicine | Admitting: Internal Medicine

## 2021-06-28 ENCOUNTER — Ambulatory Visit (INDEPENDENT_AMBULATORY_CARE_PROVIDER_SITE_OTHER): Payer: Managed Care, Other (non HMO)

## 2021-06-28 DIAGNOSIS — M25551 Pain in right hip: Secondary | ICD-10-CM

## 2021-06-28 MED ORDER — PREDNISONE 20 MG PO TABS: 40.0000 mg | ORAL_TABLET | Freq: Every day | ORAL | 0 refills | Status: AC

## 2021-06-28 NOTE — ED Provider Notes (Signed)
[facility]    CSN: [id number] Arrival date & time: 06/28/21  1135      History   Chief Complaint Chief Complaint  Patient presents with   Pain    HPI Mary Campbell is a 56 y.o. female.   Patient presents with right hip pain that started last night.  Pain starts in the right lower back and radiates around to right lateral hip and into right groin.  Patient has taken Motrin this morning with no improvement in pain.  Patient reports that she fell against her dresser with her right hip a few days prior but is not sure if this is attributing to pain.  Denies any numbness or tingling.  Patient does have history of chronic left hip pain but denies any chronic pain in the right hip.     Past Medical History:  Diagnosis Date   Chronic back pain    Diabetes mellitus    Hyperlipidemia    Hypertension    PAD (peripheral artery disease) ([facility])    a. PV angio 10/2018 - bilateral SFA disease s/p L intervention.   PAD (peripheral artery disease) ([facility])    Tobacco abuse     Patient Active Problem List   Diagnosis Date Noted   Claudication in peripheral vascular disease ([facility]) 10/04/2018   Essential hypertension 10/04/2018   Hyperlipidemia 10/04/2018   Tobacco abuse 10/04/2018   Family history of heart disease 10/04/2018    Past Surgical History:  Procedure Laterality Date   ABDOMINAL AORTOGRAM W/LOWER EXTREMITY Right 10/18/2018   Procedure: ABDOMINAL AORTOGRAM W/LOWER EXTREMITY;  Surgeon: Runell Gess, MD;  [facility];  Service: Cardiovascular;  Laterality: Right;   ABDOMINAL AORTOGRAM W/LOWER EXTREMITY N/A 04/02/2020   Procedure: ABDOMINAL AORTOGRAM W/LOWER EXTREMITY;  Surgeon: Runell Gess, MD;  Location: [facility];  Service: Cardiovascular;  Laterality: N/A;   ABDOMINAL AORTOGRAM W/LOWER EXTREMITY N/A 04/23/2020   Procedure: ABDOMINAL AORTOGRAM W/LOWER EXTREMITY;  Surgeon: Runell Gess, MD;  Location: [facility];  Service:  Cardiovascular;  Laterality: N/A;   ABDOMINAL HYSTERECTOMY     CHOLECYSTECTOMY     PERIPHERAL VASCULAR ATHERECTOMY Left 10/18/2018   Procedure: PERIPHERAL VASCULAR ATHERECTOMY;  Surgeon: Runell Gess, MD;  [facility]: [facility];  Service: Cardiovascular;  Laterality: Left;  SFA   PERIPHERAL VASCULAR ATHERECTOMY Right 11/22/2018   Procedure: PERIPHERAL VASCULAR ATHERECTOMY;  Surgeon: Runell Gess, MD;  [facility];  Service: Cardiovascular;  Laterality: Right;  SFA   PERIPHERAL VASCULAR ATHERECTOMY  04/23/2020   Procedure: PERIPHERAL VASCULAR ATHERECTOMY;  Surgeon: Runell Gess, MD;  [facility];  Service: Cardiovascular;;   PERIPHERAL VASCULAR BALLOON ANGIOPLASTY Right 11/22/2018   Procedure: PERIPHERAL VASCULAR BALLOON ANGIOPLASTY;  Surgeon: Runell Gess, MD;  [facility];  Service: Cardiovascular;  Laterality: Right;  SFA   PERIPHERAL VASCULAR BALLOON ANGIOPLASTY  04/23/2020   Procedure: PERIPHERAL VASCULAR BALLOON ANGIOPLASTY;  Surgeon: Runell Gess, MD;  [facility];  Service: Cardiovascular;;   PERIPHERAL VASCULAR INTERVENTION Left 04/02/2020   Procedure: PERIPHERAL VASCULAR INTERVENTION;  Surgeon: Runell Gess, MD;  [facility]: [facility];  Service: Cardiovascular;  Laterality: Left;    OB History   No obstetric history on file.      Home Medications    Prior to Admission medications   Medication Sig Start Date End Date Taking? Authorizing Provider  predniSONE (DELTASONE) 20 MG tablet  Take 2 tablets (40 mg total) by mouth daily for 5 days. 06/28/21 07/03/21 Yes Marielena Harvell, Acie FredricksonHaley E, FNP  acetaminophen (TYLENOL) 650 MG CR tablet Take 1,300 mg by mouth every 8 (eight) hours as needed for pain.    [provider]  aspirin EC 81 MG tablet Take 81 mg by mouth daily.    [provider]  Azilsartan-Chlorthalidone (EDARBYCLOR) 40-12.5 MG TABS Take 1 tablet by mouth daily.     [provider]  clopidogrel (PLAVIX) 75 MG tablet Take 1 tablet (75 mg total) by mouth daily. 04/22/21   Runell GessBerry, Jonathan J, MD  diphenhydrAMINE (BENADRYL) 25 mg capsule Take 25 mg by mouth every 6 (six) hours as needed for allergies.    [provider]  fluticasone (FLONASE) 50 MCG/ACT nasal spray Place 1 spray into both nostrils 2 (two) times daily. 10/20/20   [provider]  glimepiride (AMARYL) 2 MG tablet Take 2 mg by mouth daily. 02/07/20   [provider]  ibuprofen (ADVIL) 200 MG tablet Take 400 mg by mouth every 6 (six) hours as needed for moderate pain or headache.    [provider]  insulin degludec (TRESIBA FLEXTOUCH) 100 UNIT/ML FlexTouch Pen Inject 42 Units into the skin at bedtime.    [provider]  metFORMIN (GLUCOPHAGE) 1000 MG tablet Take 1,000 mg by mouth 2 (two) times daily with a meal. 08/04/18   [provider]  nicotine polacrilex (COMMIT) 4 MG lozenge Take 4 mg by mouth daily as needed for smoking cessation.    [provider]  Phenylephrine-Acetaminophen 5-325 MG TABS Take 2 tablets by mouth daily as needed (allergy / sinus headaches).    [provider]  rosuvastatin (CRESTOR) 40 MG tablet Take 1 tablet (40 mg total) by mouth daily. 04/22/21   O'Neal, Ronnald RampWesley Thomas, MD    Family History Family History  Problem Relation Age of Onset   Diabetes Mother    Hypertension Mother    Hyperlipidemia Mother    Heart attack Father    Heart disease Father    Hypertension Father    Hyperlipidemia Father     Social History Social History   Tobacco Use   Smoking status: Former    Packs/day: 1.00    Types: Cigarettes    Quit date: 03/30/2020    Years since quitting: 1.2   Smokeless tobacco: Never  Vaping Use   Vaping Use: Never used  Substance Use Topics   Alcohol use: No   Drug use: No     Allergies   Codeine, Lisinopril, and Sulfonamide derivatives   Review of Systems Review of  Systems Per HPI  Physical Exam Triage Vital Signs ED Triage Vitals [06/28/21 1319]  Enc Vitals Group     BP (!) 200/82     Pulse Rate 75     Resp 20     Temp 98.4 F (36.9 C)     Temp Source Oral     SpO2 96 %     Weight      Height      Head Circumference      Peak Flow      Pain Score 10     Pain Loc      Pain Edu?      Excl. in GC?    No data found.  Updated Vital Signs BP (!) 162/76 (BP Location: Right Arm)   Pulse 75   Temp 98.4 F (36.9 C) (Oral)   Resp 20  SpO2 96%   Visual Acuity Right Eye Distance:   Left Eye Distance:   Bilateral Distance:    Right Eye Near:   Left Eye Near:    Bilateral Near:     Physical Exam Constitutional:      General: She is not in acute distress.    Appearance: Normal appearance. She is not toxic-appearing or diaphoretic.  HENT:     Head: Normocephalic and atraumatic.  Eyes:     Extraocular Movements: Extraocular movements intact.     Conjunctiva/sclera: Conjunctivae normal.  Pulmonary:     Effort: Pulmonary effort is normal.  Musculoskeletal:       Back:     Comments: Tenderness to palpation to right lower lumbar region, right lateral hip.  No obvious direct spinal tenderness, crepitus, step-off.  No obvious discoloration, swelling, lacerations, abrasions noted.  Neurological:     General: No focal deficit present.     Mental Status: She is alert and oriented to person, place, and time. Mental status is at baseline.  Psychiatric:        Mood and Affect: Mood normal.        Behavior: Behavior normal.        Thought Content: Thought content normal.        Judgment: Judgment normal.      UC Treatments / Results  Labs (all labs ordered are listed, but only abnormal results are displayed) Labs Reviewed - No data to display  EKG   Radiology DG Hip Unilat W or Wo Pelvis 2-3 Views Right  Result Date: 06/28/2021 CLINICAL DATA:  Acute right hip pain. EXAM: DG HIP (WITH OR WITHOUT PELVIS) 2-3V RIGHT COMPARISON:   None Available. FINDINGS: There is no evidence of hip fracture or dislocation. No significant joint space narrowing is noted. Mild osteophyte formation of right hip is noted. IMPRESSION: Mild degenerative joint disease of right hip. No acute abnormality is seen. Electronically Signed   By: Lupita Raider M.D.   On: 06/28/2021 14:47    Procedures Procedures (including critical care time)  Medications Ordered in UC Medications - No data to display  Initial Impression / Assessment and Plan / UC Course  I have reviewed the triage vital signs and the nursing notes.  Pertinent labs & imaging results that were available during my care of the patient were reviewed by me and considered in my medical decision making (see chart for details).     X-ray showing degenerative changes of right hip.  This could be contributing to patient's pain.  Limited options on pain management given that patient takes daily Plavix and aspirin.  Patient to avoid NSAIDs.  I do think that a low-dose and short course of prednisone would be reasonable to decrease inflammation associated with patient's flareup of pain.  Patient does have history of diabetes but reports that she has tolerated prednisone well in the past and blood sugars typically tolerate it well.  Will treat with prednisone and patient advised to monitor blood sugar more closely while on prednisone.  Patient already has established orthopedist and patient was advised to follow-up with orthopedist for further evaluation and management.  Discussed supportive care with patient.  Discussed return precautions.  Patient verbalized understanding and was agreeable with plan. Final Clinical Impressions(s) / UC Diagnoses   Final diagnoses:  Right hip pain     Discharge Instructions      It appears that you have chronic degenerative changes of your hip which represent arthritis.  You  have been prescribed prednisone to decrease inflammation.  May also apply ice to area.   Follow-up with your orthopedist for further evaluation and management.     ED Prescriptions     Medication Sig Dispense Auth. Provider   predniSONE (DELTASONE) 20 MG tablet Take 2 tablets (40 mg total) by mouth daily for 5 days. 10 tablet Gustavus Bryant, [place]      PDMP not reviewed this encounter.   Gustavus Bryant, [place] 06/28/21 1500

## 2021-06-28 NOTE — Discharge Instructions (Signed)
It appears that you have chronic degenerative changes of your hip which represent arthritis.  You have been prescribed prednisone to decrease inflammation.  May also apply ice to area.  Follow-up with your orthopedist for further evaluation and management.

## 2021-06-28 NOTE — ED Triage Notes (Signed)
Patient presents to Urgent Care with complaints of extreme pain in right hip since last night. Patient reports pain with pressure and radiating through the leg. Pt reports 800 mg of motrin this morning 10/10 pain

## 2021-08-04 ENCOUNTER — Other Ambulatory Visit: Payer: Self-pay | Admitting: Internal Medicine

## 2021-08-04 DIAGNOSIS — J439 Emphysema, unspecified: Secondary | ICD-10-CM

## 2021-08-04 DIAGNOSIS — J449 Chronic obstructive pulmonary disease, unspecified: Secondary | ICD-10-CM

## 2021-08-11 ENCOUNTER — Other Ambulatory Visit: Payer: Self-pay | Admitting: Internal Medicine

## 2021-08-11 DIAGNOSIS — Z1231 Encounter for screening mammogram for malignant neoplasm of breast: Secondary | ICD-10-CM

## 2021-08-31 ENCOUNTER — Other Ambulatory Visit: Payer: Managed Care, Other (non HMO)

## 2021-09-22 ENCOUNTER — Inpatient Hospital Stay: Admission: RE | Admit: 2021-09-22 | Payer: Managed Care, Other (non HMO) | Source: Ambulatory Visit

## 2021-09-27 ENCOUNTER — Ambulatory Visit
Admission: RE | Admit: 2021-09-27 | Discharge: 2021-09-27 | Disposition: A | Discharge status: Home / Self Care | Payer: Managed Care, Other (non HMO) | Source: Ambulatory Visit | Attending: Internal Medicine | Admitting: Internal Medicine

## 2021-09-27 DIAGNOSIS — Z1231 Encounter for screening mammogram for malignant neoplasm of breast: Secondary | ICD-10-CM

## 2021-10-04 ENCOUNTER — Inpatient Hospital Stay: Admission: RE | Admit: 2021-10-04 | Payer: Managed Care, Other (non HMO) | Source: Ambulatory Visit

## 2021-10-17 ENCOUNTER — Other Ambulatory Visit: Payer: Self-pay | Admitting: Cardiovascular Disease

## 2021-10-18 ENCOUNTER — Ambulatory Visit (HOSPITAL_COMMUNITY)
Admission: RE | Admit: 2021-10-18 | Discharge: 2021-10-18 | Disposition: A | Discharge status: Home / Self Care | Payer: Managed Care, Other (non HMO) | Source: Ambulatory Visit | Attending: Cardiovascular Disease | Admitting: Cardiovascular Disease

## 2021-10-18 DIAGNOSIS — I739 Peripheral vascular disease, unspecified: Secondary | ICD-10-CM | POA: Insufficient documentation

## 2021-10-18 DIAGNOSIS — Z9862 Peripheral vascular angioplasty status: Secondary | ICD-10-CM | POA: Diagnosis present

## 2021-11-05 ENCOUNTER — Encounter: Payer: Self-pay | Admitting: Cardiovascular Disease

## 2021-11-05 ENCOUNTER — Ambulatory Visit: Payer: Managed Care, Other (non HMO) | Attending: Cardiovascular Disease | Admitting: Cardiovascular Disease

## 2021-11-05 VITALS — BP 140/60 | HR 80 | Resp 99 | Ht 64.0 in | Wt 183.0 lb

## 2021-11-05 DIAGNOSIS — I739 Peripheral vascular disease, unspecified: Secondary | ICD-10-CM

## 2021-11-05 NOTE — Progress Notes (Signed)
11/05/2021 Mary Campbell   1965-10-08  [id number]  Primary Physician Jilda Panda, MD Primary Cardiologist: Lorretta Harp MD [name], [place]  HPI:  Mary Campbell is a 56 y.o.  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  She was referred by Dr. Audie Box , her primary cardiologist, for peripheral vascular valuation because of lifestyle limiting claudication.  I last saw her in the office 11/03/2020. Her cardiac risk factors are notable for 40-50-pack-year tobacco abuse currently trying to stop on Chantix, treated hypertension, diabetes and hyperlipidemia as well as family history with a father who had multiple stents myocardial infarction's.  She is never had a heart attack or stroke.  She currently denies chest pain or shortness of breath although recently she did have chest pain and was evaluated by Dr. Jenetta DownerNori Riis  She had a negative Myoview and a normal 2D echo.  She is complained of claudication over the last year which is fairly symmetric.  This is not bothering her during her workday but when walking longer distances, out shopping, she does have to stop because of cramping in her calves.  Doppler studies performed 09/26/2018 revealed ABIs of about 0.7 bilaterally with an occluded above-the-knee popliteal on the right and high-frequency signal in the proximal left SFA.   I performed peripheral angiography on her 10/18/2018 revealing short segment CTO mid to distal right SFA with three-vessel runoff and 95% / 60% segmental proximal left SFA stenosis with three-vessel runoff.  Performed directional atherectomy followed by drug-coated balloon angioplasty of the proximal left SFA with an excellent result.  Her claudication resolved and her Dopplers improved with a left ABI 0.84.  She wishes to proceed with right SFA intervention.   I performed right SFA directional arthrectomy followed by drug-coated balloon angioplasty on 11/22/2018 with excellent  angiographic result.  Her Dopplers improved and her claudication has resolved.  Of note, she did stop smoking on 11/20/2018.  She no longer has pain in her legs at night and is able to ambulate without limitation.   Doppler studies performed 02/26/2020 that suggest restenosis of both SFAs.  She relates to me that she is moderately symptomatic and wishes to proceed with percutaneous intervention.  She is on Chantix and successfully stopped smoking on 03/30/2020.   I performed peripheral angiography on her 04/02/2020 revealing high-grade segmental mid left SFA stenosis with three-vessel runoff.  I performed directional atherectomy followed by drug-coated balloon angioplasty with an excellent angiographic and clinical result.  Claudication on that side resolved and her foot is now warm.     I performed right SFA intervention on her/7/22 with directional atherectomy followed by [facility] using distal protection.  She went home the same day after I performed Perclose of her left common femoral artery.  Her follow-up lower extremity arterial Doppler studies performed on 04/30/2020 revealed ABIs in the mid 0.8 range with patent SFAs bilaterally.  Her claudication has resolved.  She did stop smoking on 03/30/2020.  Unfortunately however her hemoglobin A1c has remained elevated.  Since I saw her a year ago she continues to do well.  She really denies claudication.  She complains of left knee pain.  Her most recent Doppler studies performed 10/18/2021 revealed a right ABI of 0.77 and left of 0.93 with high-frequency signals in both SFAs.  She does continue to smoke a third of a pack a day.   Current Meds  Medication Sig   acetaminophen (TYLENOL) 650 MG CR tablet  Take 1,300 mg by mouth every 8 (eight) hours as needed for pain.   aspirin EC 81 MG tablet Take 81 mg by mouth daily.   Azilsartan-Chlorthalidone (EDARBYCLOR) 40-12.5 MG TABS Take 1 tablet by mouth daily.   clopidogrel (PLAVIX) 75 MG tablet Take 1 tablet by mouth  once daily   diclofenac (VOLTAREN) 75 MG EC tablet as needed.   diphenhydrAMINE (BENADRYL) 25 mg capsule Take 25 mg by mouth every 6 (six) hours as needed for allergies.   fluticasone (FLONASE) 50 MCG/ACT nasal spray Place 1 spray into both nostrils 2 (two) times daily.   glimepiride (AMARYL) 2 MG tablet Take 2 mg by mouth daily.   ibuprofen (ADVIL) 200 MG tablet Take 400 mg by mouth every 6 (six) hours as needed for moderate pain or headache.   insulin degludec (TRESIBA FLEXTOUCH) 100 UNIT/ML FlexTouch Pen Inject 42 Units into the skin at bedtime.   metFORMIN (GLUCOPHAGE) 1000 MG tablet Take 1,000 mg by mouth 2 (two) times daily with a meal.   nicotine polacrilex (COMMIT) 4 MG lozenge Take 4 mg by mouth daily as needed for smoking cessation.   rosuvastatin (CRESTOR) 40 MG tablet Take 1 tablet (40 mg total) by mouth daily.   Current Facility-Administered Medications for the 11/05/21 encounter (Office Visit) with Lorretta Harp, MD  Medication   sodium chloride flush (NS) 0.9 % injection 3 mL   sodium chloride flush (NS) 0.9 % injection 3 mL     Allergies  Allergen Reactions   Codeine Hives   Lisinopril Cough   Sulfonamide Derivatives Hives    Social History   Socioeconomic History   Marital status: Legally Separated    Spouse name: Not on file   Number of children: Not on file   Years of education: Not on file   Highest education level: Not on file  Occupational History   Not on file  Tobacco Use   Smoking status: Former    Packs/day: 1.00    Types: Cigarettes    Quit date: 03/30/2020    Years since quitting: 1.6   Smokeless tobacco: Never  Vaping Use   Vaping Use: Never used  Substance and Sexual Activity   Alcohol use: No   Drug use: No   Sexual activity: Yes    Birth control/protection: Surgical  Other Topics Concern   Not on file  Social History Narrative   Not on file   Social Determinants of Health   Financial Resource Strain: Not on file  Food  Insecurity: Not on file  Transportation Needs: Not on file  Physical Activity: Not on file  Stress: Not on file  Social Connections: Not on file  Intimate Partner Violence: Not on file     Review of Systems: General: negative for chills, fever, night sweats or weight changes.  Cardiovascular: negative for chest pain, dyspnea on exertion, edema, orthopnea, palpitations, paroxysmal nocturnal dyspnea or shortness of breath Dermatological: negative for rash Respiratory: negative for cough or wheezing Urologic: negative for hematuria Abdominal: negative for nausea, vomiting, diarrhea, bright red blood per rectum, melena, or hematemesis Neurologic: negative for visual changes, syncope, or dizziness All other systems reviewed and are otherwise negative except as noted above.    Blood pressure (!) 140/60, pulse 80, resp. rate (!) 99, height 5\' 4"  (1.626 m), weight 183 lb (83 kg).  General appearance: alert and no distress Neck: no adenopathy, no carotid bruit, no JVD, supple, symmetrical, trachea midline, and thyroid not enlarged, symmetric, no tenderness/mass/nodules Lungs:  clear to auscultation bilaterally Heart: regular rate and rhythm, S1, S2 normal, no murmur, click, rub or gallop Extremities: extremities normal, atraumatic, no cyanosis or edema Pulses: 2+ and symmetric Skin: Skin color, texture, turgor normal. No rashes or lesions Neurologic: Grossly normal  EKG sinus rhythm 80 with septal Q waves.  I personally reviewed this EKG.  ASSESSMENT AND PLAN:   Claudication in peripheral vascular disease ([place]) Ms. Chiusano returns today for follow-up of peripheral arterial disease.  I performed bilateral SFA intervention on her back in 2020 and again in 2022 for claudication.  Her most recent Doppler studies performed 10/18/2021 revealed a right ABI of 0.77 and a left of 0.93.  She does have high-frequency signals in both SFAs which have been progressive.  She does however deny claudication.   She continues to smoke.  At this point, I am going to follow her noninvasively on a semiannual basis.  If and when she develops recurrent symptoms she would be a candidate for repeat intervention.     Lorretta Harp MD FACP,FACC,FAHA, [facility] 11/05/2021 10:11 AM

## 2021-11-05 NOTE — Patient Instructions (Signed)
Medication Instructions:  Your physician recommends that you continue on your current medications as directed. Please refer to the Current Medication list given to you today.  *If you need a refill on your cardiac medications before your next appointment, please call your pharmacy*   Testing/Procedures: Your physician has requested that you have a lower extremity arterial duplex. This test is an ultrasound of the arteries in the legs. It looks at arterial blood flow in the legs. Allow one hour for Lower  Arterial scans. There are no restrictions or special instructions To be done in April 2024. This procedure will be done at [place]  Your physician has requested that you have an ankle brachial index (ABI). During this test an ultrasound and blood pressure cuff are used to evaluate the arteries that supply the arms and legs with blood. Allow thirty minutes for this exam. There are no restrictions or special instructions. To be done in April 2024. This procedure will be done at [place]. [address]    Follow-Up: At [facility], you and your health needs are our priority.  As part of our continuing mission to provide you with exceptional heart care, we have created designated Provider Care Teams.  These Care Teams include your primary Cardiologist (physician) and Advanced Practice Providers (APPs -  Physician Assistants and Nurse Practitioners) who all work together to provide you with the care you need, when you need it.  We recommend signing up for the patient portal called "MyChart".  Sign up information is provided on this After Visit Summary.  MyChart is used to connect with patients for Virtual Visits (Telemedicine).  Patients are able to view lab/test results, encounter notes, upcoming appointments, etc.  Non-urgent messages can be sent to your provider as well.   To learn more about what you can do with MyChart, go to [email or web address].    Your next  appointment:   6 month(s)  The format for your next appointment:   In Person  Provider:   Quay Burow, MD

## 2021-11-05 NOTE — Assessment & Plan Note (Signed)
Mary Campbell returns today for follow-up of peripheral arterial disease.  I performed bilateral SFA intervention on her back in 2020 and again in 2022 for claudication.  Her most recent Doppler studies performed 10/18/2021 revealed a right ABI of 0.77 and a left of 0.93.  She does have high-frequency signals in both SFAs which have been progressive.  She does however deny claudication.  She continues to smoke.  At this point, I am going to follow her noninvasively on a semiannual basis.  If and when she develops recurrent symptoms she would be a candidate for repeat intervention.

## 2021-12-17 ENCOUNTER — Other Ambulatory Visit: Payer: Self-pay | Admitting: Cardiovascular Disease

## 2022-04-22 ENCOUNTER — Ambulatory Visit (HOSPITAL_COMMUNITY)
Admission: RE | Admit: 2022-04-22 | Discharge: 2022-04-22 | Disposition: A | Discharge status: Home / Self Care | Payer: Managed Care, Other (non HMO) | Source: Ambulatory Visit | Attending: Cardiovascular Disease | Admitting: Cardiovascular Disease

## 2022-04-22 DIAGNOSIS — I739 Peripheral vascular disease, unspecified: Secondary | ICD-10-CM | POA: Diagnosis present

## 2022-04-25 LAB — VAS US ABI WITH/WO TBI
Left ABI: 0.84
Right ABI: 0.88

## 2022-07-07 ENCOUNTER — Other Ambulatory Visit: Payer: Self-pay

## 2022-07-07 MED ORDER — CLOPIDOGREL BISULFATE 75 MG PO TABS: 75.0000 mg | ORAL_TABLET | Freq: Every day | ORAL | 1 refills | Status: AC

## 2022-07-18 ENCOUNTER — Ambulatory Visit
Admission: RE | Admit: 2022-07-18 | Discharge: 2022-07-18 | Disposition: A | Discharge status: Home / Self Care | Payer: Managed Care, Other (non HMO) | Source: Ambulatory Visit | Attending: Internal Medicine | Admitting: Internal Medicine

## 2022-07-18 DIAGNOSIS — J449 Chronic obstructive pulmonary disease, unspecified: Secondary | ICD-10-CM

## 2022-07-18 DIAGNOSIS — J439 Emphysema, unspecified: Secondary | ICD-10-CM

## 2022-08-29 NOTE — Progress Notes (Deleted)
Cardiology Office Note:   Date:  08/29/2022  NAME:  Mary Campbell    MRN: [id number] DOB:  Jun 05, 1965   PCP:  Ralene Ok, MD  Cardiologist:  Reatha Harps, MD  Electrophysiologist:  None   Referring MD: Ralene Ok, MD   No chief complaint on file.   History of Present Illness:   Mary Campbell is a 57 y.o. female with a hx of PAD, DM, HLD, tobacco abuse who presents for follow-up.   Problem List 1. PAD -angioplasty L SFA 10/19/2018 2. DM -A1c 7.8 3. HLD -T chol 112, HDL 29, LDL 56, TG 154 4. Tobacco abuse 5. Obesity  Past Medical History: Past Medical History:  Diagnosis Date   Chronic back pain    Diabetes mellitus    Hyperlipidemia    Hypertension    PAD (peripheral artery disease) (HCC)    a. PV angio 10/2018 - bilateral SFA disease s/p L intervention.   PAD (peripheral artery disease) (HCC)    Tobacco abuse     Past Surgical History: Past Surgical History:  Procedure Laterality Date   ABDOMINAL AORTOGRAM W/LOWER EXTREMITY Right 10/18/2018   Procedure: ABDOMINAL AORTOGRAM W/LOWER EXTREMITY;  Surgeon: Runell Gess, MD;  Location: [facility];  Service: Cardiovascular;  Laterality: Right;   ABDOMINAL AORTOGRAM W/LOWER EXTREMITY N/A 04/02/2020   Procedure: ABDOMINAL AORTOGRAM W/LOWER EXTREMITY;  Surgeon: Runell Gess, MD;  Location: [facility]VAS[facility];  Service: Cardiovascular;  Laterality: N/A;   ABDOMINAL AORTOGRAM W/LOWER EXTREMITY N/A 04/23/2020   Procedure: ABDOMINAL AORTOGRAM W/LOWER EXTREMITY;  Surgeon: Runell Gess, MD;  Location: [facility];  Service: Cardiovascular;  Laterality: N/A;   ABDOMINAL HYSTERECTOMY     CHOLECYSTECTOMY     PERIPHERAL VASCULAR ATHERECTOMY Left 10/18/2018   Procedure: PERIPHERAL VASCULAR ATHERECTOMY;  Surgeon: Runell Gess, MD;  Location: [facility];  Service: Cardiovascular;  Laterality: Left;  SFA   PERIPHERAL VASCULAR ATHERECTOMY Right 11/22/2018   Procedure: PERIPHERAL VASCULAR  ATHERECTOMY;  Surgeon: Runell Gess, MD;  Location: [facility] INVASIVE CV LAB;  Service: Cardiovascular;  Laterality: Right;  SFA   PERIPHERAL VASCULAR ATHERECTOMY  04/23/2020   Procedure: PERIPHERAL VASCULAR ATHERECTOMY;  Surgeon: Runell Gess, MD;  Location: [facility] CV [facility];  Service: Cardiovascular;;   PERIPHERAL VASCULAR BALLOON ANGIOPLASTY Right 11/22/2018   Procedure: PERIPHERAL VASCULAR BALLOON ANGIOPLASTY;  Surgeon: Runell Gess, MD;  Location: [facility];  Service: Cardiovascular;  Laterality: Right;  SFA   PERIPHERAL VASCULAR BALLOON ANGIOPLASTY  04/23/2020   Procedure: PERIPHERAL VASCULAR BALLOON ANGIOPLASTY;  Surgeon: Runell Gess, MD;  Location: [facility];  Service: Cardiovascular;;   PERIPHERAL VASCULAR INTERVENTION Left 04/02/2020   Procedure: PERIPHERAL VASCULAR INTERVENTION;  Surgeon: Runell Gess, MD;  Location: [facility];  Service: Cardiovascular;  Laterality: Left;    Current Medications: No outpatient medications have been marked as taking for the 08/31/22 encounter (Appointment) with Sande Rives, MD.   Current Facility-Administered Medications for the 08/31/22 encounter (Appointment) with Sande Rives, MD  Medication   sodium chloride flush (NS) 0.9 % injection 3 mL   sodium chloride flush (NS) 0.9 % injection 3 mL     Allergies:    Codeine, Lisinopril, and Sulfonamide derivatives   Social History: Social History   Socioeconomic History   Marital status: Legally Separated    Spouse name: Not on file   Number of children: Not on file   Years of education:  Not on file   Highest education level: Not on file  Occupational History   Not on file  Tobacco Use   Smoking status: Former    Current packs/day: 0.00    Types: Cigarettes    Quit date: 03/30/2020    Years since quitting: 2.4   Smokeless tobacco: Never  Vaping Use   Vaping status: Never Used  Substance and Sexual Activity   Alcohol use: No    Drug use: No   Sexual activity: Yes    Birth control/protection: Surgical  Other Topics Concern   Not on file  Social History Narrative   Not on file   Social Determinants of Health   Financial Resource Strain: Not on file  Food Insecurity: Not on file  Transportation Needs: Not on file  Physical Activity: Not on file  Stress: Not on file  Social Connections: Not on file     Family History: The patient's family history includes Diabetes in her mother; Heart attack in her father; Heart disease in her father; Hyperlipidemia in her father and mother; Hypertension in her father and mother. There is no history of Breast cancer.  ROS:   All other ROS reviewed and negative. Pertinent positives noted in the HPI.     EKGs/Labs/Other Studies Reviewed:   The following studies were personally reviewed by me today:  EKG:  EKG is *** ordered today.        Vasc US 04/25/2022 Summary:  Right: Mild progression is noted compared to previous study.    Atherosclerosis throughout.  50-99% restenosis in the proximal/mid SFA, s/p atherectomy and  angioplasty.   Left: Mild progression is noted compared to previous study.   Atherosclerosis throughout.  50-99% restenosis in the proximal/mid SFA, s/p atherectomy and  angioplasty.   Recent Labs: No results found for requested labs within last 365 days.   Recent Lipid Panel    Component Value Date/Time   CHOL 118 03/11/2020 1005   TRIG 257 (H) 03/11/2020 1005   HDL 27 (L) 03/11/2020 1005   CHOLHDL 4.4 03/11/2020 1005   LDLCALC 50 03/11/2020 1005    Physical Exam:   VS:  There were no vitals taken for this visit.   Wt Readings from Last 3 Encounters:  11/05/21 183 lb (83 kg)  06/24/21 178 lb 12.8 oz (81.1 kg)  11/23/20 187 lb (84.8 kg)    General: Well nourished, well developed, in no acute distress Head: Atraumatic, normal size  Eyes: PEERLA, EOMI  Neck: Supple, no JVD Endocrine: No thryomegaly Cardiac: Normal S1, S2; RRR; no  murmurs, rubs, or gallops Lungs: Clear to auscultation bilaterally, no wheezing, rhonchi or rales  Abd: Soft, nontender, no hepatomegaly  Ext: No edema, pulses 2+ Musculoskeletal: No deformities, BUE and BLE strength normal and equal Skin: Warm and dry, no rashes   Neuro: Alert and oriented to person, place, time, and situation, CNII-XII grossly intact, no focal deficits  Psych: Normal mood and affect   ASSESSMENT:   Mary Campbell is a 57 y.o. female who presents for the following: No diagnosis found.  PLAN:   There are no diagnoses linked to this encounter.  {Are you ordering a CV Procedure (e.g. stress test, cath, DCCV, TEE, etc)?   Press F2        :[id number]}  Disposition: No follow-ups on file.  Medication Adjustments/Labs and Tests Ordered: Current medicines are reviewed at length with the patient today.  Concerns regarding medicines are outlined above.  No orders of the defined  types were placed in this encounter.  No orders of the defined types were placed in this encounter.  There are no Patient Instructions on file for this visit.   Time Spent with Patient: I have spent a total of *** minutes with patient reviewing hospital notes, telemetry, EKGs, labs and examining the patient as well as establishing an assessment and plan that was discussed with the patient.  > 50% of time was spent in direct patient care.  Signed, Lenna Gilford. Flora Lipps, MD, [facility]  [facility]  [address] [place], [place] [address] [phone]  08/29/2022 10:29 AM

## 2022-08-31 ENCOUNTER — Ambulatory Visit: Payer: Medicaid Other | Admitting: Cardiovascular Disease

## 2022-08-31 DIAGNOSIS — Z9862 Peripheral vascular angioplasty status: Secondary | ICD-10-CM

## 2022-08-31 DIAGNOSIS — Z720 Tobacco use: Secondary | ICD-10-CM

## 2022-08-31 DIAGNOSIS — I739 Peripheral vascular disease, unspecified: Secondary | ICD-10-CM

## 2022-08-31 DIAGNOSIS — I10 Essential (primary) hypertension: Secondary | ICD-10-CM

## 2022-08-31 DIAGNOSIS — E782 Mixed hyperlipidemia: Secondary | ICD-10-CM

## 2022-09-14 ENCOUNTER — Ambulatory Visit (INDEPENDENT_AMBULATORY_CARE_PROVIDER_SITE_OTHER): Payer: Medicaid Other

## 2022-09-14 ENCOUNTER — Ambulatory Visit (INDEPENDENT_AMBULATORY_CARE_PROVIDER_SITE_OTHER): Payer: Medicaid Other | Admitting: Podiatry

## 2022-09-14 ENCOUNTER — Encounter: Payer: Self-pay | Admitting: Podiatry

## 2022-09-14 DIAGNOSIS — E1351 Other specified diabetes mellitus with diabetic peripheral angiopathy without gangrene: Secondary | ICD-10-CM

## 2022-09-14 DIAGNOSIS — E0843 Diabetes mellitus due to underlying condition with diabetic autonomic (poly)neuropathy: Secondary | ICD-10-CM

## 2022-09-14 DIAGNOSIS — M7732 Calcaneal spur, left foot: Secondary | ICD-10-CM | POA: Diagnosis not present

## 2022-09-14 DIAGNOSIS — M2550 Pain in unspecified joint: Secondary | ICD-10-CM

## 2022-09-14 DIAGNOSIS — M7731 Calcaneal spur, right foot: Secondary | ICD-10-CM

## 2022-09-14 DIAGNOSIS — M7730 Calcaneal spur, unspecified foot: Secondary | ICD-10-CM

## 2022-09-14 NOTE — Progress Notes (Signed)
Chief Complaint  Patient presents with   Foot Pain   Heel Spurs    RM6; patient is here for bilateral foot pain and heel spurs    HPI: 57 y.o. female PMHx diabetes mellitus and PAD presenting today for evaluation of bilateral foot and ankle pain.  Patient states that she experiences foot and ankle pain on a daily basis.  She also has back pain and upper extremity joint pain.  Patient is currently on anticoagulant.  She takes Tylenol arthritis as well as gabapentin prescribed by her spine doctor.  Past Medical History:  Diagnosis Date   Chronic back pain    Diabetes mellitus    Hyperlipidemia    Hypertension    PAD (peripheral artery disease) (HCC)    a. PV angio 10/2018 - bilateral SFA disease s/p L intervention.   PAD (peripheral artery disease) (HCC)    Tobacco abuse     Past Surgical History:  Procedure Laterality Date   ABDOMINAL AORTOGRAM W/LOWER EXTREMITY Right 10/18/2018   Procedure: ABDOMINAL AORTOGRAM W/LOWER EXTREMITY;  Surgeon: Runell Gess, MD;  [facility];  Service: Cardiovascular;  Laterality: Right;   ABDOMINAL AORTOGRAM W/LOWER EXTREMITY N/A 04/02/2020   Procedure: ABDOMINAL AORTOGRAM W/LOWER EXTREMITY;  Surgeon: Runell Gess, MD;  [facility]: [facility];  Service: Cardiovascular;  Laterality: N/A;   ABDOMINAL AORTOGRAM W/LOWER EXTREMITY N/A 04/23/2020   Procedure: ABDOMINAL AORTOGRAM W/LOWER EXTREMITY;  Surgeon: Runell Gess, MD;  [facility]: [facility];  Service: Cardiovascular;  Laterality: N/A;   ABDOMINAL HYSTERECTOMY     CHOLECYSTECTOMY     PERIPHERAL VASCULAR ATHERECTOMY Left 10/18/2018   Procedure: PERIPHERAL VASCULAR ATHERECTOMY;  Surgeon: Runell Gess, MD;  [facility];  Service: Cardiovascular;  Laterality: Left;  SFA   PERIPHERAL VASCULAR ATHERECTOMY Right 11/22/2018   Procedure: PERIPHERAL VASCULAR ATHERECTOMY;  Surgeon: Runell Gess, MD;  Location: [facility] INVASIVE CV LAB;  Service:  Cardiovascular;  Laterality: Right;  SFA   PERIPHERAL VASCULAR ATHERECTOMY  04/23/2020   Procedure: PERIPHERAL VASCULAR ATHERECTOMY;  Surgeon: Runell Gess, MD;  Location: [facility] INVASIVE CV LAB;  Service: Cardiovascular;;   PERIPHERAL VASCULAR BALLOON ANGIOPLASTY Right 11/22/2018   Procedure: PERIPHERAL VASCULAR BALLOON ANGIOPLASTY;  Surgeon: Runell Gess, MD;  [facility];  Service: Cardiovascular;  Laterality: Right;  SFA   PERIPHERAL VASCULAR BALLOON ANGIOPLASTY  04/23/2020   Procedure: PERIPHERAL VASCULAR BALLOON ANGIOPLASTY;  Surgeon: Runell Gess, MD;  [facility];  Service: Cardiovascular;;   PERIPHERAL VASCULAR INTERVENTION Left 04/02/2020   Procedure: PERIPHERAL VASCULAR INTERVENTION;  Surgeon: Runell Gess, MD;  [facility];  Service: Cardiovascular;  Laterality: Left;    Allergies  Allergen Reactions   Codeine Hives   Lisinopril Cough   Sulfonamide Derivatives Hives     Physical Exam: General: The patient is alert and oriented x3 in no acute distress.  Dermatology: Skin is warm, dry and supple bilateral lower extremities.   Vascular: VAS [place] ABI WITH/WO TBI 04/22/2022 ABI Findings:  +---------+------------------+-----+-----------+--------+  Right   Rt Pressure (mmHg)IndexWaveform   Comment   +---------+------------------+-----+-----------+--------+  Brachial 193                                         +---------+------------------+-----+-----------+--------+  PTA     169  0.88 multiphasic          +---------+------------------+-----+-----------+--------+  DP      165               0.85 multiphasic          +---------+------------------+-----+-----------+--------+  Great Toe102               0.53 Abnormal             +---------+------------------+-----+-----------+--------+   +---------+------------------+-----+-----------+-------+  Left    Lt Pressure  (mmHg)IndexWaveform   Comment  +---------+------------------+-----+-----------+-------+  Brachial 190                                        +---------+------------------+-----+-----------+-------+  PTA     163               0.84 multiphasic         +---------+------------------+-----+-----------+-------+  DP      152               0.79 multiphasic         +---------+------------------+-----+-----------+-------+  Great Toe76                0.39 Abnormal            +---------+------------------+-----+-----------+-------+   +-------+-----------+-----------+------------+------------+  ABI/TBIToday's ABIToday's TBIPrevious ABIPrevious TBI  +-------+-----------+-----------+------------+------------+  Right .88        .53        .77         .29           +-------+-----------+-----------+------------+------------+  Left  .84        .39        .93         .43           +-------+-----------+-----------+------------+------------+  Right ABIs appear increased compared to prior study on 10/18/2021. Left ABIs and bilateral TBIs appear essentially unchanged compared to prior study on 10/18/2021.    Summary:  Right: Resting right ankle-brachial index indicates mild right lower  extremity arterial disease. The right toe-brachial index is abnormal.   Left: Resting left ankle-brachial index indicates mild left lower  extremity arterial disease. The left toe-brachial index is abnormal.   *See table(s) above for measurements and observations.  See LE Arterial duplex report.   Neurological: Diminished intact via light touch  Musculoskeletal Exam: No pedal deformities noted  Radiographic Exam B/L feet 09/14/2022:  Diffuse osseous demineralization noted bilateral consistent with osteopenia.  Joint spaces mostly preserved.  No fractures or osseous irregularities noted.  Impression: Negative  Assessment/Plan of Care: 1.  Diabetes mellitus with peripheral  polyneuropathy 2.  Peripheral vascular disease 3.  Generalized foot and ankle pain  -Patient evaluated.  X-rays reviewed -Continue close management of PVD -I do believe that the patient is experiencing a combination of pain including arthritis, peripheral vascular disease, and uncontrolled diabetes mellitus with peripheral polyneuropathy. -Patient is already on Tylenol arthritis and gabapentin.  Continue -Recommend good supportive shoes and sneakers.  Recommended the shoe market unless [employer] -Return to clinic annually       Felecia Shelling, DPM [facility]  Dr. Felecia Shelling, DPM    [address]  [place], [place] [address]                Office [phone]  Fax [phone]

## 2022-09-23 ENCOUNTER — Other Ambulatory Visit: Payer: Self-pay

## 2022-09-23 MED ORDER — ROSUVASTATIN CALCIUM 40 MG PO TABS: 40.0000 mg | ORAL_TABLET | Freq: Every day | ORAL | 0 refills | Status: DC

## 2022-10-15 ENCOUNTER — Ambulatory Visit (HOSPITAL_BASED_OUTPATIENT_CLINIC_OR_DEPARTMENT_OTHER): Payer: Managed Care, Other (non HMO) | Admitting: Rehabilitative and Restorative Service Providers"

## 2022-10-15 ENCOUNTER — Encounter (HOSPITAL_BASED_OUTPATIENT_CLINIC_OR_DEPARTMENT_OTHER): Payer: Self-pay

## 2022-10-24 ENCOUNTER — Ambulatory Visit (HOSPITAL_BASED_OUTPATIENT_CLINIC_OR_DEPARTMENT_OTHER): Payer: Medicaid Other | Admitting: Physical Therapy

## 2022-10-24 NOTE — Therapy (Deleted)
OUTPATIENT PHYSICAL THERAPY LOWER EXTREMITY EVALUATION   Patient Name: Mary Campbell MRN: [id number] DOB:08-02-1965, 57 y.o., female Today's Date: 10/24/2022  END OF SESSION:   Past Medical History:  Diagnosis Date   Chronic back pain    Diabetes mellitus    Hyperlipidemia    Hypertension    PAD (peripheral artery disease) (HCC)    a. PV angio 10/2018 - bilateral SFA disease s/p L intervention.   PAD (peripheral artery disease) (HCC)    Tobacco abuse    Past Surgical History:  Procedure Laterality Date   ABDOMINAL AORTOGRAM W/LOWER EXTREMITY Right 10/18/2018   Procedure: ABDOMINAL AORTOGRAM W/LOWER EXTREMITY;  Surgeon: Runell Gess, MD;  [facility];  Service: Cardiovascular;  Laterality: Right;   ABDOMINAL AORTOGRAM W/LOWER EXTREMITY N/A 04/02/2020   Procedure: ABDOMINAL AORTOGRAM W/LOWER EXTREMITY;  Surgeon: Runell Gess, MD;  [facility];  Service: Cardiovascular;  Laterality: N/A;   ABDOMINAL AORTOGRAM W/LOWER EXTREMITY N/A 04/23/2020   Procedure: ABDOMINAL AORTOGRAM W/LOWER EXTREMITY;  Surgeon: Runell Gess, MD;  [facility];  Service: Cardiovascular;  Laterality: N/A;   ABDOMINAL HYSTERECTOMY     CHOLECYSTECTOMY     PERIPHERAL VASCULAR ATHERECTOMY Left 10/18/2018   Procedure: PERIPHERAL VASCULAR ATHERECTOMY;  Surgeon: Runell Gess, MD;  [facility];  Service: Cardiovascular;  Laterality: Left;  SFA   PERIPHERAL VASCULAR ATHERECTOMY Right 11/22/2018   Procedure: PERIPHERAL VASCULAR ATHERECTOMY;  Surgeon: Runell Gess, MD;  Location: [facility] INVASIVE CV LAB;  Service: Cardiovascular;  Laterality: Right;  SFA   PERIPHERAL VASCULAR ATHERECTOMY  04/23/2020   Procedure: PERIPHERAL VASCULAR ATHERECTOMY;  Surgeon: Runell Gess, MD;  Location: [facility] INVASIVE CV LAB;  Service: Cardiovascular;;   PERIPHERAL VASCULAR BALLOON ANGIOPLASTY Right 11/22/2018   Procedure: PERIPHERAL VASCULAR BALLOON  ANGIOPLASTY;  Surgeon: Runell Gess, MD;  [facility];  Service: Cardiovascular;  Laterality: Right;  SFA   PERIPHERAL VASCULAR BALLOON ANGIOPLASTY  04/23/2020   Procedure: PERIPHERAL VASCULAR BALLOON ANGIOPLASTY;  Surgeon: Runell Gess, MD;  [facility];  Service: Cardiovascular;;   PERIPHERAL VASCULAR INTERVENTION Left 04/02/2020   Procedure: PERIPHERAL VASCULAR INTERVENTION;  Surgeon: Runell Gess, MD;  [facility];  Service: Cardiovascular;  Laterality: Left;   Patient Active Problem List   Diagnosis Date Noted   Claudication in peripheral vascular disease (HCC) 10/04/2018   Essential hypertension 10/04/2018   Hyperlipidemia 10/04/2018   Tobacco abuse 10/04/2018   Family history of heart disease 10/04/2018    PCP: Ralene Ok, MD   REFERRING PROVIDER:  Delfin Gant, MD     REFERRING DIAG: [phone] (ICD-10-CM) - Pain in left knee   THERAPY DIAG:  No diagnosis found.  Rationale for Evaluation and Treatment: Rehabilitation  ONSET DATE: ***  SUBJECTIVE:   SUBJECTIVE STATEMENT: ***  PERTINENT HISTORY: *** PAIN:  Are you having pain? Yes: NPRS scale: ***/10 Pain location: *** Pain description: *** Aggravating factors: *** Relieving factors: ***  PRECAUTIONS: {Therapy precautions:24002}  RED FLAGS: {PT Red Flags:29287}   WEIGHT BEARING RESTRICTIONS: {Yes ***/No:24003}  FALLS:  Has patient fallen in last 6 months? {fallsyesno:27318}  LIVING ENVIRONMENT: Lives with: {OP[facility] lives with:25569::"lives with their family"} Lives in: {Lives in:25570} Stairs: {opstairs:27293} Has following equipment at home: {Assistive devices:23999}  OCCUPATION: ***  PLOF: {PLOF:24004}  PATIENT GOALS: ***  NEXT MD VISIT: ***  OBJECTIVE:  Note: Objective measures were completed at Evaluation unless otherwise noted.  DIAGNOSTIC FINDINGS: ***  PATIENT SURVEYS:  FOTO ***  COGNITION: Overall cognitive  status: {cognition:24006}     SENSATION: {sensation:27233}  EDEMA:  {edema:24020}  MUSCLE LENGTH: Hamstrings: Right *** deg; Left *** deg Thomas test: Right *** deg; Left *** deg  POSTURE: {posture:25561}  PALPATION: ***  LOWER EXTREMITY ROM:  {AROM/PROM:27142} ROM Right eval Left eval  Hip flexion    Hip extension    Hip abduction    Hip adduction    Hip internal rotation    Hip external rotation    Knee flexion    Knee extension    Ankle dorsiflexion    Ankle plantarflexion    Ankle inversion    Ankle eversion     (Blank rows = not tested)  LOWER EXTREMITY MMT:  MMT Right eval Left eval  Hip flexion    Hip extension    Hip abduction    Hip adduction    Hip internal rotation    Hip external rotation    Knee flexion    Knee extension    Ankle dorsiflexion    Ankle plantarflexion    Ankle inversion    Ankle eversion     (Blank rows = not tested)  LOWER EXTREMITY SPECIAL TESTS:  {LEspecialtests:26242}  FUNCTIONAL TESTS:  {Functional tests:24029}  GAIT: Distance walked: *** Assistive device utilized: {Assistive devices:23999} Level of assistance: {Levels of assistance:24026} Comments: ***   TODAY'S TREATMENT:                                                                                                                              DATE: ***    PATIENT EDUCATION:  Education details: *** Person educated: {Person educated:25204} Education method: {Education Method:25205} Education comprehension: {Education Comprehension:25206}  HOME EXERCISE PROGRAM: ***  ASSESSMENT:  CLINICAL IMPRESSION: Patient is a *** y.o. *** who was seen today for physical therapy evaluation and treatment for ***.   OBJECTIVE IMPAIRMENTS: {opptimpairments:25111}.   ACTIVITY LIMITATIONS: {activitylimitations:27494}  PARTICIPATION LIMITATIONS: {participationrestrictions:25113}  PERSONAL FACTORS: {Personal factors:25162} are also affecting patient's functional  outcome.   REHAB POTENTIAL: {rehabpotential:25112}  CLINICAL DECISION MAKING: {clinical decision making:25114}  EVALUATION COMPLEXITY: {Evaluation complexity:25115}   GOALS: Goals reviewed with patient? {yes/no:20286}  SHORT TERM GOALS: Target date: *** *** Baseline: Goal status: INITIAL  2.  *** Baseline:  Goal status: INITIAL  3.  *** Baseline:  Goal status: INITIAL  4.  *** Baseline:  Goal status: INITIAL  5.  *** Baseline:  Goal status: INITIAL  6.  *** Baseline:  Goal status: INITIAL  LONG TERM GOALS: Target date: ***  *** Baseline:  Goal status: INITIAL  2.  *** Baseline:  Goal status: INITIAL  3.  *** Baseline:  Goal status: INITIAL  4.  *** Baseline:  Goal status: INITIAL  5.  *** Baseline:  Goal status: INITIAL  6.  *** Baseline:  Goal status: INITIAL   PLAN:  PT FREQUENCY: {rehab frequency:25116}  PT DURATION: {rehab duration:25117}  PLANNED INTERVENTIONS: {rehab planned interventions:25118::"Therapeutic exercises","Therapeutic activity","Neuromuscular re-education","Balance training","Gait training","Patient/Family education","Self Care","Joint mobilization"}  PLAN FOR NEXT SESSION: ***   Zebedee Iba, PT 10/24/2022, 12:46 PM

## 2022-10-29 ENCOUNTER — Other Ambulatory Visit: Payer: Self-pay

## 2022-10-29 ENCOUNTER — Ambulatory Visit (HOSPITAL_BASED_OUTPATIENT_CLINIC_OR_DEPARTMENT_OTHER): Payer: Medicaid Other | Attending: Sports Medicine | Admitting: Physical Therapy

## 2022-10-29 ENCOUNTER — Encounter (HOSPITAL_BASED_OUTPATIENT_CLINIC_OR_DEPARTMENT_OTHER): Payer: Self-pay | Admitting: Physical Therapy

## 2022-10-29 DIAGNOSIS — M25562 Pain in left knee: Secondary | ICD-10-CM | POA: Insufficient documentation

## 2022-10-29 DIAGNOSIS — G8929 Other chronic pain: Secondary | ICD-10-CM | POA: Diagnosis not present

## 2022-10-29 DIAGNOSIS — M6281 Muscle weakness (generalized): Secondary | ICD-10-CM

## 2022-10-29 DIAGNOSIS — R262 Difficulty in walking, not elsewhere classified: Secondary | ICD-10-CM | POA: Diagnosis not present

## 2022-10-29 NOTE — Therapy (Signed)
OUTPATIENT PHYSICAL THERAPY EVALUATION   Patient Name: Mary Campbell MRN: [id number] DOB:1965-08-22, 57 y.o., female Today's Date: 10/29/2022  END OF SESSION:  PT End of Session - 10/29/22 0931     Visit Number 1    Number of Visits 10    Date for PT Re-Evaluation 01/07/23    PT Start Time 0830    PT Stop Time 0916    PT Time Calculation (min) 46 min    Activity Tolerance Patient tolerated treatment well;Patient limited by pain    Behavior During Therapy [facility] for tasks assessed/performed             Past Medical History:  Diagnosis Date   Chronic back pain    Diabetes mellitus    Hyperlipidemia    Hypertension    PAD (peripheral artery disease) (HCC)    a. PV angio 10/2018 - bilateral SFA disease s/p L intervention.   PAD (peripheral artery disease) (HCC)    Tobacco abuse    Past Surgical History:  Procedure Laterality Date   ABDOMINAL AORTOGRAM W/LOWER EXTREMITY Right 10/18/2018   Procedure: ABDOMINAL AORTOGRAM W/LOWER EXTREMITY;  Surgeon: Runell Gess, MD;  [facility];  Service: Cardiovascular;  Laterality: Right;   ABDOMINAL AORTOGRAM W/LOWER EXTREMITY N/A 04/02/2020   Procedure: ABDOMINAL AORTOGRAM W/LOWER EXTREMITY;  Surgeon: Runell Gess, MD;  [facility];  Service: Cardiovascular;  Laterality: N/A;   ABDOMINAL AORTOGRAM W/LOWER EXTREMITY N/A 04/23/2020   Procedure: ABDOMINAL AORTOGRAM W/LOWER EXTREMITY;  Surgeon: Runell Gess, MD;  [facility];  Service: Cardiovascular;  Laterality: N/A;   ABDOMINAL HYSTERECTOMY     CHOLECYSTECTOMY     PERIPHERAL VASCULAR ATHERECTOMY Left 10/18/2018   Procedure: PERIPHERAL VASCULAR ATHERECTOMY;  Surgeon: Runell Gess, MD;  [facility];  Service: Cardiovascular;  Laterality: Left;  SFA   PERIPHERAL VASCULAR ATHERECTOMY Right 11/22/2018   Procedure: PERIPHERAL VASCULAR ATHERECTOMY;  Surgeon: Runell Gess, MD;  [facility] INVASIVE CV LAB;   Service: Cardiovascular;  Laterality: Right;  SFA   PERIPHERAL VASCULAR ATHERECTOMY  04/23/2020   Procedure: PERIPHERAL VASCULAR ATHERECTOMY;  Surgeon: Runell Gess, MD;  [facility] INVASIVE CV LAB;  Service: Cardiovascular;;   PERIPHERAL VASCULAR BALLOON ANGIOPLASTY Right 11/22/2018   Procedure: PERIPHERAL VASCULAR BALLOON ANGIOPLASTY;  Surgeon: Runell Gess, MD;  [facility];  Service: Cardiovascular;  Laterality: Right;  SFA   PERIPHERAL VASCULAR BALLOON ANGIOPLASTY  04/23/2020   Procedure: PERIPHERAL VASCULAR BALLOON ANGIOPLASTY;  Surgeon: Runell Gess, MD;  [facility];  Service: Cardiovascular;;   PERIPHERAL VASCULAR INTERVENTION Left 04/02/2020   Procedure: PERIPHERAL VASCULAR INTERVENTION;  Surgeon: Runell Gess, MD;  [facility]: [facility];  Service: Cardiovascular;  Laterality: Left;   Patient Active Problem List   Diagnosis Date Noted   Claudication in peripheral vascular disease (HCC) 10/04/2018   Essential hypertension 10/04/2018   Hyperlipidemia 10/04/2018   Tobacco abuse 10/04/2018   Family history of heart disease 10/04/2018     REFERRING PROVIDER:  Delfin Gant, MD    REFERRING DIAG: [phone] (ICD-10-CM) - Pain in left knee   Rationale for Evaluation and Treatment: Rehabilitation  THERAPY DIAG:  Chronic pain of left knee  Difficulty in walking, not elsewhere classified  Muscle weakness (generalized)  ONSET DATE: 10/28/2020  (approximately)   SUBJECTIVE:  SUBJECTIVE STATEMENT: Lt knee has been bothering me about 2 years, both are bad but Lt is worse. Wear and tear from working on concrete. Inflammatory arthritis. Dx with fibromyalgia. Both shoulders can't lift above shoulder height without pain. Both hands and feet hurt.  Hips hurt. Bilateral groin pain. Lt lumbar spine/hip hurts to sit. Pain going down post Lt knee, sometimes past knee, use a cane because knee gives out sometimes. Achilles are tight. 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 Rt leg started occasionally will shake when I stand up- frequency depends on the pain.  Can only stand 15-20 min before pain. I can sit about 20-30 min.  A little dizzy sometimes but I think it's from the medications.  Has a recumb bike & treadmill at home.   PERTINENT HISTORY:  DM, PD, tobacco use  PAIN:  Are you having pain? Yes: NPRS scale: 7- knee, 6-10- whole body/10 Pain location: Lt knee, whole body Pain description: sharp at medial knee Aggravating factors: constant Relieving factors: rest  PRECAUTIONS:  None  RED FLAGS: None   WEIGHT BEARING RESTRICTIONS:  No  FALLS:  Has patient fallen in last 6 months? No  LIVING ENVIRONMENT: Multi-story home, one hand rail on Rt side in ascending  OCCUPATION:  Not planning to return to work at this time.   PLOF:  Needs a shower chair or help to get out of tub, help with dressing- particularly bra and shoes  PATIENT GOALS:  Clean and cook again, go on trips again, back to being active  OBJECTIVE:  Note: Objective measures were completed at Evaluation unless otherwise noted.  PATIENT SURVEYS:  ABC scale 170 total LEFS 10/80  COGNITIVE STATUS: Within functional limits for tasks assessed   SENSATION: Both hands and feet get numb  EDEMA:  No  POSTURE:  forward head, increased thoracic kyphosis, and flexed trunk   GAIT: Distance walked: wait room to clinic & around clinic Assistive device utilized: Single point cane in Right hand Comments: slow cadence, pressure into cane, bil flat foot strike, short step length   Body Part #1 Knee   LOWER EXTREMITY MMT:    MMT(lb) Right/Lt eval   Hip flexion    Hip extension     Hip abduction (sidelying) 3/5 // 3-/5   Hip adduction    Hip internal rotation    Hip external rotation    Knee flexion 7.0/6.8   Knee extension 9.1/6.2    (Blank rows = not tested)     FUNCTIONAL TESTS:  5 times sit to stand: only able to complete 2 sit<>stand in 1 min.  3 minute walk test: 58'10"     TREATMENT:                                                                                                                               DATE: EVAL 10/29/22 See HEP  PATIENT EDUCATION:  Education details: Teacher, music of condition, POC, HEP, exercise form/rationale Person educated: Patient Education method: Explanation, Demonstration, Tactile cues, Verbal cues, and Handouts Education comprehension: verbalized understanding, returned demonstration, verbal cues required, tactile cues required, and needs further education  HOME EXERCISE PROGRAM: [id number]    ASSESSMENT:  CLINICAL IMPRESSION: Patient is a 57 y.o. F who was seen today for physical therapy evaluation and treatment for referral of left knee pain. Upon evaluation, noted that whole biomechanical chain is limited by significant weakness that does not support functional movement. Pt will begin in aquatics for strength building as current levels do not well support land- based treatment. Will benefit from skilled PT to address deficits and meet goals.     OBJECTIVE IMPAIRMENTS: Abnormal gait, decreased activity tolerance, decreased endurance, decreased mobility, difficulty walking, decreased strength, increased muscle spasms, impaired flexibility, impaired sensation, impaired UE functional use, improper body mechanics, postural dysfunction, and pain.   ACTIVITY LIMITATIONS: carrying, lifting, bending, sitting, standing, squatting, sleeping, stairs, transfers, bed mobility, bathing, toileting, dressing, reach over head, hygiene/grooming, locomotion level, and caring for others  PARTICIPATION LIMITATIONS: meal prep,  cleaning, laundry, driving, shopping, community activity, and occupation  PERSONAL FACTORS:  see pmh  are also affecting patient's functional outcome.   REHAB POTENTIAL: Fair    CLINICAL DECISION MAKING: Evolving/moderate complexity  EVALUATION COMPLEXITY: Moderate   GOALS: Goals reviewed with patient? Yes  SHORT TERM GOALS: Target date: 11/19/22  Begin a daily walking program Baseline:will educate and est as appropriate Goal status: INITIAL  2.  Able to demo sit<>stand without trunk rotation & good use of glut set Baseline: began working at eval to reduce rotation to use Lt hand to get out of chair Goal status: INITIAL  3.  Able to find a good working tolerance in aquatic exercise Baseline: to begin next visit  Goal status: INITIAL    LONG TERM GOALS: Target date: POC DATE  ABC scale improvements: stairs to 40%, bend over to pick up a slipper to 40% Baseline: both at 10% at eval Goal status: INITIAL  2.  Able to complete 5times sit to stand 2 min or less Baseline: unable to complete at eval Goal status: INITIAL  3.  LEFS to improve by MDC Baseline: MDC is 9 points Goal status: INITIAL  4.  3 min walk distance to at least 200 ft Baseline: see obj Goal status: INITIAL  5.  Knee pain avg <=5/10 Baseline: resting at 6 today, up to 10/10 and limiting Goal status: INITIAL    PLAN:  PT FREQUENCY: 1x/week  PT DURATION: 10 weeks  PLANNED INTERVENTIONS: 97146- PT Re-evaluation, 97110-Therapeutic exercises, 97530- Therapeutic activity, 97112- Neuromuscular re-education, 97535- Self Care, 82956- Manual therapy, [id number]- Gait training, [phone]- Aquatic Therapy, 97014- Electrical stimulation (unattended), Patient/Family education, Balance training, Stair training, Taping, Dry Needling, Joint mobilization, Spinal mobilization, Cryotherapy, and Moist heat.  PLAN FOR NEXT SESSION: begin aquatics, give walking guide handout   Lonza Shimabukuro C. Djon Tith PT, DPT 10/29/22 9:35  AM  Check all possible CPT codes: See Planned Interventions List for Planned CPT Codes    Check all conditions that are expected to impact treatment: {Conditions expected to impact treatment:Diabetes mellitus   If treatment provided at initial evaluation, no treatment charged due to lack of authorization.

## 2022-11-01 ENCOUNTER — Ambulatory Visit (HOSPITAL_BASED_OUTPATIENT_CLINIC_OR_DEPARTMENT_OTHER): Payer: Medicaid Other | Admitting: Physical Therapy

## 2022-11-01 ENCOUNTER — Encounter (HOSPITAL_BASED_OUTPATIENT_CLINIC_OR_DEPARTMENT_OTHER): Payer: Self-pay | Admitting: Physical Therapy

## 2022-11-01 DIAGNOSIS — R262 Difficulty in walking, not elsewhere classified: Secondary | ICD-10-CM

## 2022-11-01 DIAGNOSIS — M6281 Muscle weakness (generalized): Secondary | ICD-10-CM

## 2022-11-01 DIAGNOSIS — M25562 Pain in left knee: Secondary | ICD-10-CM | POA: Diagnosis not present

## 2022-11-01 DIAGNOSIS — G8929 Other chronic pain: Secondary | ICD-10-CM

## 2022-11-01 NOTE — Therapy (Signed)
OUTPATIENT PHYSICAL THERAPY  TREATMENT   Patient Name: Mary Campbell MRN: [id number] DOB:1965-05-05, 57 y.o., female Today's Date: 11/01/2022  END OF SESSION:  PT End of Session - 11/01/22 1612     Visit Number 2    Number of Visits 10    Date for PT Re-Evaluation 01/07/23    PT Start Time 1614    PT Stop Time 1652    PT Time Calculation (min) 38 min    Behavior During Therapy WFL for tasks assessed/performed             Past Medical History:  Diagnosis Date   Chronic back pain    Diabetes mellitus    Hyperlipidemia    Hypertension    PAD (peripheral artery disease) (HCC)    a. PV angio 10/2018 - bilateral SFA disease s/p L intervention.   PAD (peripheral artery disease) (HCC)    Tobacco abuse    Past Surgical History:  Procedure Laterality Date   ABDOMINAL AORTOGRAM W/LOWER EXTREMITY Right 10/18/2018   Procedure: ABDOMINAL AORTOGRAM W/LOWER EXTREMITY;  Surgeon: Runell Gess, MD;  Location: [facility];  Service: Cardiovascular;  Laterality: Right;   ABDOMINAL AORTOGRAM W/LOWER EXTREMITY N/A 04/02/2020   Procedure: ABDOMINAL AORTOGRAM W/LOWER EXTREMITY;  Surgeon: Runell Gess, MD;  Location: [facility];  Service: Cardiovascular;  Laterality: N/A;   ABDOMINAL AORTOGRAM W/LOWER EXTREMITY N/A 04/23/2020   Procedure: ABDOMINAL AORTOGRAM W/LOWER EXTREMITY;  Surgeon: Runell Gess, MD;  Location: [facility];  Service: Cardiovascular;  Laterality: N/A;   ABDOMINAL HYSTERECTOMY     CHOLECYSTECTOMY     PERIPHERAL VASCULAR ATHERECTOMY Left 10/18/2018   Procedure: PERIPHERAL VASCULAR ATHERECTOMY;  Surgeon: Runell Gess, MD;  Location: [facility];  Service: Cardiovascular;  Laterality: Left;  SFA   PERIPHERAL VASCULAR ATHERECTOMY Right 11/22/2018   Procedure: PERIPHERAL VASCULAR ATHERECTOMY;  Surgeon: Runell Gess, MD;  Location: [facility];  Service: Cardiovascular;  Laterality: Right;  SFA   PERIPHERAL VASCULAR  ATHERECTOMY  04/23/2020   Procedure: PERIPHERAL VASCULAR ATHERECTOMY;  Surgeon: Runell Gess, MD;  Location: [facility];  Service: Cardiovascular;;   PERIPHERAL VASCULAR BALLOON ANGIOPLASTY Right 11/22/2018   Procedure: PERIPHERAL VASCULAR BALLOON ANGIOPLASTY;  Surgeon: Runell Gess, MD;  [facility];  Service: Cardiovascular;  Laterality: Right;  SFA   PERIPHERAL VASCULAR BALLOON ANGIOPLASTY  04/23/2020   Procedure: PERIPHERAL VASCULAR BALLOON ANGIOPLASTY;  Surgeon: Runell Gess, MD;  [facility];  Service: Cardiovascular;;   PERIPHERAL VASCULAR INTERVENTION Left 04/02/2020   Procedure: PERIPHERAL VASCULAR INTERVENTION;  Surgeon: Runell Gess, MD;  [facility]: [facility];  Service: Cardiovascular;  Laterality: Left;   Patient Active Problem List   Diagnosis Date Noted   Claudication in peripheral vascular disease (HCC) 10/04/2018   Essential hypertension 10/04/2018   Hyperlipidemia 10/04/2018   Tobacco abuse 10/04/2018   Family history of heart disease 10/04/2018     REFERRING PROVIDER:  Delfin Gant, MD    REFERRING DIAG: [phone] (ICD-10-CM) - Pain in left knee   Rationale for Evaluation and Treatment: Rehabilitation  THERAPY DIAG:  Chronic pain of left knee  Difficulty in walking, not elsewhere classified  Muscle weakness (generalized)  ONSET DATE: 10/28/2020  (approximately)   SUBJECTIVE:  SUBJECTIVE STATEMENT: Pt reports no new changes since evaluation.  She has been completing HEP 3x/day. Pt reports that she knows how to swim; was in her brother's pool this summer but needed to hold onto a noodle for support.   FROM INITIAL EVALUATION Lt knee has been bothering me about 2 years, both are bad but Lt is worse. Wear and tear  from working on concrete. Inflammatory arthritis. Dx with fibromyalgia. Both shoulders can't lift above shoulder height without pain. Both hands and feet hurt. Hips hurt. Bilateral groin pain. Lt lumbar spine/hip hurts to sit. Pain going down post Lt knee, sometimes past knee, use a cane because knee gives out sometimes. Achilles are tight. 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 Rt leg started occasionally will shake when I stand up- frequency depends on the pain.  Can only stand 15-20 min before pain. I can sit about 20-30 min.  A little dizzy sometimes but I think it's from the medications.  Has a recumb bike & treadmill at home.   PERTINENT HISTORY:  DM, PD, tobacco use  PAIN:  Are you having pain? Yes: NPRS scale: 5/10- Lt knee, 7/10 lower back  Pain location: see above  Pain description: sharp, burning Aggravating factors: constant Relieving factors: rest  PRECAUTIONS:  None  RED FLAGS: None   WEIGHT BEARING RESTRICTIONS:  No  FALLS:  Has patient fallen in last 6 months? No  LIVING ENVIRONMENT: Multi-story home, one hand rail on Rt side in ascending  OCCUPATION:  Not planning to return to work at this time.   PLOF:  Needs a shower chair or help to get out of tub, help with dressing- particularly bra and shoes  PATIENT GOALS:  Clean and cook again, go on trips again, back to being active  OBJECTIVE:  Note: Objective measures were completed at Evaluation unless otherwise noted.  PATIENT SURVEYS:  ABC scale 170 total LEFS 10/80  COGNITIVE STATUS: Within functional limits for tasks assessed   SENSATION: Both hands and feet get numb  EDEMA:  No  POSTURE:  forward head, increased thoracic kyphosis, and flexed trunk   GAIT: Distance walked: wait room to clinic & around clinic Assistive device utilized: Single point cane in Right hand Comments: slow cadence, pressure into cane, bil  flat foot strike, short step length   Body Part #1 Knee   LOWER EXTREMITY MMT:    MMT(lb) Right/Lt eval   Hip flexion    Hip extension    Hip abduction (sidelying) 3/5 // 3-/5   Hip adduction    Hip internal rotation    Hip external rotation    Knee flexion 7.0/6.8   Knee extension 9.1/6.2    (Blank rows = not tested)     FUNCTIONAL TESTS:  5 times sit to stand: only able to complete 2 sit<>stand in 1 min.  3 minute walk test: 58'10"     TREATMENT:  Pt seen for aquatic therapy today.  Treatment took place in water 3.5-4.75 ft in depth at the [facility] pool. Temp of water was 91.  Pt entered the pool via stairs with supervision with heavy Bilat rail, exited via chair lift with SBA for STS transfer for safety. * UE on barbell/ yellow noodle: walking forward 1 lap, rest x 3 sets * UE on wall:  relaxed squats x 8;  heel/toe raises x 8; hip abdct/ addct x 5 each  * UE on barbell: side stepping 1/2 width R/L, rest x 3 sets * STS at bench in water with UE on barbell x 6 with cues for ab set, hip hinge and forward arm reach   Pt requires the buoyancy and hydrostatic pressure of water for support, and to offload joints by unweighting joint load by at least 50 % in navel deep water and by at least 75-80% in chest to neck deep water.  Viscosity of the water is needed for resistance of strengthening. Water current perturbations provides challenge to standing balance requiring increased core activation.      PATIENT EDUCATION:  Education details: intro to aquatic therapy  Person educated: Patient Education method: Explanation, Demonstration, Tactile cues, Verbal cues, and Handouts Education comprehension: verbalized understanding, returned demonstration, verbal cues required, tactile cues required, and needs further education  HOME EXERCISE  PROGRAM: [email or web address]    ASSESSMENT:  CLINICAL IMPRESSION: Pt is observed moving in very cautious, guarded manner during session.  With increased time, she is observed relaxing more in the aquatic environment. Pt voiced fear for falling; pt reassured of safe environment for exercise. She requires UE support on floatation aid while in the water for improved confidence. Pt is able to take direction from therapist on deck.  She reported no change in pain during session. Hamstrings appear very tight as she had difficulty straightening them to put them on deck while riding in lift chair.  She requires SBA/CGA when walking outside of pool.  Pt is encouraged to utilize rollator/walker instead of cane for safety and to have her daughter transport her to/from pool until she gets stronger.  Goals are ongoing.    FROM EVAL Patient is a 57 y.o. F who was seen today for physical therapy evaluation and treatment for referral of left knee pain. Upon evaluation, noted that whole biomechanical chain is limited by significant weakness that does not support functional movement. Pt will begin in aquatics for strength building as current levels do not well support land- based treatment. Will benefit from skilled PT to address deficits and meet goals.     OBJECTIVE IMPAIRMENTS: Abnormal gait, decreased activity tolerance, decreased endurance, decreased mobility, difficulty walking, decreased strength, increased muscle spasms, impaired flexibility, impaired sensation, impaired UE functional use, improper body mechanics, postural dysfunction, and pain.   ACTIVITY LIMITATIONS: carrying, lifting, bending, sitting, standing, squatting, sleeping, stairs, transfers, bed mobility, bathing, toileting, dressing, reach over head, hygiene/grooming, locomotion level, and caring for others  PARTICIPATION LIMITATIONS: meal prep, cleaning, laundry, driving, shopping, community activity, and occupation  PERSONAL FACTORS:  see pmh  are  also affecting patient's functional outcome.   REHAB POTENTIAL: Fair    CLINICAL DECISION MAKING: Evolving/moderate complexity  EVALUATION COMPLEXITY: Moderate   GOALS: Goals reviewed with patient? Yes  SHORT TERM GOALS: Target date: 11/19/22  Begin a daily walking program Baseline:will educate and est as appropriate Goal status: INITIAL  2.  Able to demo sit<>stand without trunk rotation & good use of glut set Baseline: began  working at eval to reduce rotation to use Lt hand to get out of chair Goal status: INITIAL  3.  Able to find a good working tolerance in aquatic exercise Baseline: to begin next visit  Goal status: INITIAL    LONG TERM GOALS: Target date: POC DATE  ABC scale improvements: stairs to 40%, bend over to pick up a slipper to 40% Baseline: both at 10% at eval Goal status: INITIAL  2.  Able to complete 5times sit to stand 2 min or less Baseline: unable to complete at eval Goal status: INITIAL  3.  LEFS to improve by MDC Baseline: MDC is 9 points Goal status: INITIAL  4.  3 min walk distance to at least 200 ft Baseline: see obj Goal status: INITIAL  5.  Knee pain avg <=5/10 Baseline: resting at 6 today, up to 10/10 and limiting Goal status: INITIAL    PLAN:  PT FREQUENCY: 1x/week  PT DURATION: 10 weeks  PLANNED INTERVENTIONS: 97146- PT Re-evaluation, 97110-Therapeutic exercises, 97530- Therapeutic activity, 97112- Neuromuscular re-education, 97535- Self Care, 41660- Manual therapy, [id number]- Gait training, [phone]- Aquatic Therapy, 97014- Electrical stimulation (unattended), Patient/Family education, Balance training, Stair training, Taping, Dry Needling, Joint mobilization, Spinal mobilization, Cryotherapy, and Moist heat.  PLAN FOR NEXT SESSION: begin aquatics, give walking guide handout  Mayer Camel, PTA 11/01/22 6:02 PM [facility] [address] [place], [place],  [address] Phone: [phone]   Fax:  [phone]    Check all possible CPT codes: See Planned Interventions List for Planned CPT Codes    Check all conditions that are expected to impact treatment: {Conditions expected to impact treatment:Diabetes mellitus   If treatment provided at initial evaluation, no treatment charged due to lack of authorization.

## 2022-11-09 ENCOUNTER — Encounter (HOSPITAL_BASED_OUTPATIENT_CLINIC_OR_DEPARTMENT_OTHER): Payer: Self-pay | Admitting: Physical Therapy

## 2022-11-09 ENCOUNTER — Ambulatory Visit (HOSPITAL_BASED_OUTPATIENT_CLINIC_OR_DEPARTMENT_OTHER): Payer: Medicaid Other | Admitting: Physical Therapy

## 2022-11-09 DIAGNOSIS — M25562 Pain in left knee: Secondary | ICD-10-CM | POA: Diagnosis not present

## 2022-11-09 DIAGNOSIS — R262 Difficulty in walking, not elsewhere classified: Secondary | ICD-10-CM

## 2022-11-09 DIAGNOSIS — M6281 Muscle weakness (generalized): Secondary | ICD-10-CM

## 2022-11-09 DIAGNOSIS — G8929 Other chronic pain: Secondary | ICD-10-CM

## 2022-11-09 NOTE — Therapy (Signed)
OUTPATIENT PHYSICAL THERAPY  TREATMENT   Patient Name: Mary Campbell MRN: [id number] DOB:[birth year], 57 y.o., female Today's Date: 11/09/2022  END OF SESSION:  PT End of Session - 11/09/22 1805     Visit Number 3    Number of Visits 10    Date for PT Re-Evaluation 01/07/23    PT Start Time 1502    PT Stop Time 1545    PT Time Calculation (min) 43 min    Activity Tolerance Patient tolerated treatment well    Behavior During Therapy WFL for tasks assessed/performed              Past Medical History:  Diagnosis Date   Chronic back pain    Diabetes mellitus    Hyperlipidemia    Hypertension    PAD (peripheral artery disease) (HCC)    a. PV angio 10/2018 - bilateral SFA disease s/p L intervention.   PAD (peripheral artery disease) (HCC)    Tobacco abuse    Past Surgical History:  Procedure Laterality Date   ABDOMINAL AORTOGRAM W/LOWER EXTREMITY Right 10/18/2018   Procedure: ABDOMINAL AORTOGRAM W/LOWER EXTREMITY;  Surgeon: Runell Gess, MD;  Location: [facility];  Service: Cardiovascular;  Laterality: Right;   ABDOMINAL AORTOGRAM W/LOWER EXTREMITY N/A 04/02/2020   Procedure: ABDOMINAL AORTOGRAM W/LOWER EXTREMITY;  Surgeon: Runell Gess, MD;  Location: [facility];  Service: Cardiovascular;  Laterality: N/A;   ABDOMINAL AORTOGRAM W/LOWER EXTREMITY N/A 04/23/2020   Procedure: ABDOMINAL AORTOGRAM W/LOWER EXTREMITY;  Surgeon: Runell Gess, MD;  Location: [facility];  Service: Cardiovascular;  Laterality: N/A;   ABDOMINAL HYSTERECTOMY     CHOLECYSTECTOMY     PERIPHERAL VASCULAR ATHERECTOMY Left 10/18/2018   Procedure: PERIPHERAL VASCULAR ATHERECTOMY;  Surgeon: Runell Gess, MD;  Location: [facility];  Service: Cardiovascular;  Laterality: Left;  SFA   PERIPHERAL VASCULAR ATHERECTOMY Right 11/22/2018   Procedure: PERIPHERAL VASCULAR ATHERECTOMY;  Surgeon: Runell Gess, MD;  Location: [facility];  Service: Cardiovascular;   Laterality: Right;  SFA   PERIPHERAL VASCULAR ATHERECTOMY  04/23/2020   Procedure: PERIPHERAL VASCULAR ATHERECTOMY;  Surgeon: Runell Gess, MD;  Location[facility];  Service: Cardiovascular;;   PERIPHERAL VASCULAR BALLOON ANGIOPLASTY Right 11/22/2018   Procedure: PERIPHERAL VASCULAR BALLOON ANGIOPLASTY;  Surgeon: Runell Gess, MD;  [facility]: [facility];  Service: Cardiovascular;  Laterality: Right;  SFA   PERIPHERAL VASCULAR BALLOON ANGIOPLASTY  04/23/2020   Procedure: PERIPHERAL VASCULAR BALLOON ANGIOPLASTY;  Surgeon: Runell Gess, MD;  [facility]: [facility];  Service: Cardiovascular;;   PERIPHERAL VASCULAR INTERVENTION Left 04/02/2020   Procedure: PERIPHERAL VASCULAR INTERVENTION;  Surgeon: Runell Gess, MD;  Location: [facility];  Service: Cardiovascular;  Laterality: Left;   Patient Active Problem List   Diagnosis Date Noted   Claudication in peripheral vascular disease (HCC) 10/04/2018   Essential hypertension 10/04/2018   Hyperlipidemia 10/04/2018   Tobacco abuse 10/04/2018   Family history of heart disease 10/04/2018     REFERRING PROVIDER:  Delfin Gant, MD    REFERRING DIAG: [id number] (ICD-10-CM) - Pain in left knee   Rationale for Evaluation and Treatment: Rehabilitation  THERAPY DIAG:  Chronic pain of left knee  Difficulty in walking, not elsewhere classified  Muscle weakness (generalized)  ONSET DATE: 10/28/2020  (approximately)   SUBJECTIVE:  SUBJECTIVE STATEMENT: Pt reports having a "good day".  LBP and left knee discomfort.  FROM INITIAL EVALUATION Lt knee has been bothering me about 2 years, both are bad but Lt is worse. Wear and tear from working on concrete. Inflammatory arthritis. Dx with fibromyalgia. Both shoulders can't  lift above shoulder height without pain. Both hands and feet hurt. Hips hurt. Bilateral groin pain. Lt lumbar spine/hip hurts to sit. Pain going down post Lt knee, sometimes past knee, use a cane because knee gives out sometimes. Achilles are tight. 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 Rt leg started occasionally will shake when I stand up- frequency depends on the pain.  Can only stand 15-20 min before pain. I can sit about 20-30 min.  A little dizzy sometimes but I think it's from the medications.  Has a recumb bike & treadmill at home.   PERTINENT HISTORY:  DM, PD, tobacco use  PAIN:  Are you having pain? Yes: NPRS scale: 5/10- Lt knee, 7/10 lower back  Pain location: see above  Pain description: sharp, burning Aggravating factors: constant Relieving factors: rest  PRECAUTIONS:  None  RED FLAGS: None   WEIGHT BEARING RESTRICTIONS:  No  FALLS:  Has patient fallen in last 6 months? No  LIVING ENVIRONMENT: Multi-story home, one hand rail on Rt side in ascending  OCCUPATION:  Not planning to return to work at this time.   PLOF:  Needs a shower chair or help to get out of tub, help with dressing- particularly bra and shoes  PATIENT GOALS:  Clean and cook again, go on trips again, back to being active  OBJECTIVE:  Note: Objective measures were completed at Evaluation unless otherwise noted.  PATIENT SURVEYS:  ABC scale 170 total LEFS 10/80  COGNITIVE STATUS: Within functional limits for tasks assessed   SENSATION: Both hands and feet get numb  EDEMA:  No  POSTURE:  forward head, increased thoracic kyphosis, and flexed trunk   GAIT: Distance walked: wait room to clinic & around clinic Assistive device utilized: Single point cane in Right hand Comments: slow cadence, pressure into cane, bil flat foot strike, short step length   Body Part #1 Knee   LOWER EXTREMITY MMT:     MMT(lb) Right/Lt eval   Hip flexion    Hip extension    Hip abduction (sidelying) 3/5 // 3-/5   Hip adduction    Hip internal rotation    Hip external rotation    Knee flexion 7.0/6.8   Knee extension 9.1/6.2    (Blank rows = not tested)     FUNCTIONAL TESTS:  5 times sit to stand: only able to complete 2 sit<>stand in 1 min.  3 minute walk test: 58'10"     TREATMENT:  Pt seen for aquatic therapy today.  Treatment took place in water 3.5-4.75 ft in depth at the [facility] pool. Temp of water was 91.  Pt entered and exited the pool via stairs with supervision with heavy ue support on hand rails, using step to pattern  * UE on barbell: walking forward 1 lap, rest x 3 sets *side stepping length of pool to 4.6 ft R/L *decompression on yellow noodle-> cycling across 4.8 ft-> skiing *Standing ue horizontal abd/add in staggered stance using rainbow HB *Standing ue support yellow noodle: relaxed squat; df x 10  - ue support on wall: df x 5 Squatted rest period *Seated on lift: hamstring and gastroc stretch; adductor stretch   Pt requires the buoyancy and hydrostatic pressure of water for support, and to offload joints by unweighting joint load by at least 50 % in navel deep water and by at least 75-80% in chest to neck deep water.  Viscosity of the water is needed for resistance of strengthening. Water current perturbations provides challenge to standing balance requiring increased core activation.      PATIENT EDUCATION:  Education details: intro to aquatic therapy  Person educated: Patient Education method: Explanation, Demonstration, Tactile cues, Verbal cues, and Handouts Education comprehension: verbalized understanding, returned demonstration, verbal cues required, tactile cues required, and needs further education  HOME EXERCISE  PROGRAM: [email or web address]    ASSESSMENT:  CLINICAL IMPRESSION: Pt comes to therapy in a wc and return via wc with dtr assist. She requires manual assist for decompression initially to find COB which she completes successfully after several tries.  Able to gain benefits of position (decreased LBP and pressure). Progressed to cycling which decreases left knee pain. She demonstrates decreased guarding and improved confidence in setting.  Focused on general movement of lower and ue with core engagement for balance. Goals ongoing     FROM EVAL Patient is a 57 y.o. F who was seen today for physical therapy evaluation and treatment for referral of left knee pain. Upon evaluation, noted that whole biomechanical chain is limited by significant weakness that does not support functional movement. Pt will begin in aquatics for strength building as current levels do not well support land- based treatment. Will benefit from skilled PT to address deficits and meet goals.     OBJECTIVE IMPAIRMENTS: Abnormal gait, decreased activity tolerance, decreased endurance, decreased mobility, difficulty walking, decreased strength, increased muscle spasms, impaired flexibility, impaired sensation, impaired UE functional use, improper body mechanics, postural dysfunction, and pain.   ACTIVITY LIMITATIONS: carrying, lifting, bending, sitting, standing, squatting, sleeping, stairs, transfers, bed mobility, bathing, toileting, dressing, reach over head, hygiene/grooming, locomotion level, and caring for others  PARTICIPATION LIMITATIONS: meal prep, cleaning, laundry, driving, shopping, community activity, and occupation  PERSONAL FACTORS:  see pmh  are also affecting patient's functional outcome.   REHAB POTENTIAL: Fair    CLINICAL DECISION MAKING: Evolving/moderate complexity  EVALUATION COMPLEXITY: Moderate   GOALS: Goals reviewed with patient? Yes  SHORT TERM GOALS: Target date: 11/19/22  Begin a daily walking  program Baseline:will educate and est as appropriate Goal status: INITIAL  2.  Able to demo sit<>stand without trunk rotation & good use of glut set Baseline: began working at eval to reduce rotation to use Lt hand to get out of chair Goal status: INITIAL  3.  Able to find a good working tolerance in aquatic exercise Baseline: to begin next visit  Goal status: INITIAL    LONG TERM GOALS: Target date: POC DATE  ABC  scale improvements: stairs to 40%, bend over to pick up a slipper to 40% Baseline: both at 10% at eval Goal status: INITIAL  2.  Able to complete 5times sit to stand 2 min or less Baseline: unable to complete at eval Goal status: INITIAL  3.  LEFS to improve by MDC Baseline: MDC is 9 points Goal status: INITIAL  4.  3 min walk distance to at least 200 ft Baseline: see obj Goal status: INITIAL  5.  Knee pain avg <=5/10 Baseline: resting at 6 today, up to 10/10 and limiting Goal status: INITIAL    PLAN:  PT FREQUENCY: 1x/week  PT DURATION: 10 weeks  PLANNED INTERVENTIONS: 97146- PT Re-evaluation, 97110-Therapeutic exercises, 97530- Therapeutic activity, 97112- Neuromuscular re-education, 97535- Self Care, 16109- Manual therapy, [id number]- Gait training, [phone]- Aquatic Therapy, 97014- Electrical stimulation (unattended), Patient/Family education, Balance training, Stair training, Taping, Dry Needling, Joint mobilization, Spinal mobilization, Cryotherapy, and Moist heat.  PLAN FOR NEXT SESSION: begin aquatics, give walking guide handout  Rushie Chestnut) Jaire Pinkham MPT 11/09/22 6:11 PM [facility] [address] [place], [place], [address] Phone: [phone]   Fax:  [phone]    Check all possible CPT codes: See Planned Interventions List for Planned CPT Codes    Check all conditions that are expected to impact treatment: {Conditions expected to impact treatment:Diabetes mellitus   If treatment provided at  initial evaluation, no treatment charged due to lack of authorization.

## 2022-11-16 ENCOUNTER — Ambulatory Visit (HOSPITAL_BASED_OUTPATIENT_CLINIC_OR_DEPARTMENT_OTHER): Payer: Medicaid Other | Admitting: Physical Therapy

## 2022-11-16 ENCOUNTER — Encounter (HOSPITAL_BASED_OUTPATIENT_CLINIC_OR_DEPARTMENT_OTHER): Payer: Self-pay | Admitting: Physical Therapy

## 2022-11-16 DIAGNOSIS — G8929 Other chronic pain: Secondary | ICD-10-CM

## 2022-11-16 DIAGNOSIS — M25562 Pain in left knee: Secondary | ICD-10-CM | POA: Diagnosis not present

## 2022-11-16 DIAGNOSIS — R262 Difficulty in walking, not elsewhere classified: Secondary | ICD-10-CM

## 2022-11-16 DIAGNOSIS — M6281 Muscle weakness (generalized): Secondary | ICD-10-CM

## 2022-11-16 NOTE — Therapy (Signed)
OUTPATIENT PHYSICAL THERAPY  TREATMENT   Patient Name: Mary Campbell MRN: [id number] DOB:[birth year], 57 y.o., female Today's Date: 11/16/2022  END OF SESSION:  PT End of Session - 11/16/22 1424     Visit Number 4    Number of Visits 10    Date for PT Re-Evaluation 01/07/23    PT Start Time 1418    PT Stop Time 1500    PT Time Calculation (min) 42 min    Activity Tolerance Patient tolerated treatment well    Behavior During Therapy WFL for tasks assessed/performed              Past Medical History:  Diagnosis Date   Chronic back pain    Diabetes mellitus    Hyperlipidemia    Hypertension    PAD (peripheral artery disease) (HCC)    a. PV angio 10/2018 - bilateral SFA disease s/p L intervention.   PAD (peripheral artery disease) (HCC)    Tobacco abuse    Past Surgical History:  Procedure Laterality Date   ABDOMINAL AORTOGRAM W/LOWER EXTREMITY Right 10/18/2018   Procedure: ABDOMINAL AORTOGRAM W/LOWER EXTREMITY;  Surgeon: Runell Gess, MD;  Location: [facility];  Service: Cardiovascular;  Laterality: Right;   ABDOMINAL AORTOGRAM W/LOWER EXTREMITY N/A 04/02/2020   Procedure: ABDOMINAL AORTOGRAM W/LOWER EXTREMITY;  Surgeon: Runell Gess, MD;  Location: [facility];  Service: Cardiovascular;  Laterality: N/A;   ABDOMINAL AORTOGRAM W/LOWER EXTREMITY N/A 04/23/2020   Procedure: ABDOMINAL AORTOGRAM W/LOWER EXTREMITY;  Surgeon: Runell Gess, MD;  Location: [facility];  Service: Cardiovascular;  Laterality: N/A;   ABDOMINAL HYSTERECTOMY     CHOLECYSTECTOMY     PERIPHERAL VASCULAR ATHERECTOMY Left 10/18/2018   Procedure: PERIPHERAL VASCULAR ATHERECTOMY;  Surgeon: Runell Gess, MD;  Location: [facility];  Service: Cardiovascular;  Laterality: Left;  SFA   PERIPHERAL VASCULAR ATHERECTOMY Right 11/22/2018   Procedure: PERIPHERAL VASCULAR ATHERECTOMY;  Surgeon: Runell Gess, MD;  Location: [facility];  Service: Cardiovascular;   Laterality: Right;  SFA   PERIPHERAL VASCULAR ATHERECTOMY  04/23/2020   Procedure: PERIPHERAL VASCULAR ATHERECTOMY;  Surgeon: Runell Gess, MD;  Location: [facility];  Service: Cardiovascular;;   PERIPHERAL VASCULAR BALLOON ANGIOPLASTY Right 11/22/2018   Procedure: PERIPHERAL VASCULAR BALLOON ANGIOPLASTY;  Surgeon: Runell Gess, MD;  Location: [facility];  Service: Cardiovascular;  Laterality: Right;  SFA   PERIPHERAL VASCULAR BALLOON ANGIOPLASTY  04/23/2020   Procedure: PERIPHERAL VASCULAR BALLOON ANGIOPLASTY;  Surgeon: Runell Gess, MD;  Location: [facility];  Service: Cardiovascular;;   PERIPHERAL VASCULAR INTERVENTION Left 04/02/2020   Procedure: PERIPHERAL VASCULAR INTERVENTION;  Surgeon: Runell Gess, MD;  Location: [facility];  Service: Cardiovascular;  Laterality: Left;   Patient Active Problem List   Diagnosis Date Noted   Claudication in peripheral vascular disease (HCC) 10/04/2018   Essential hypertension 10/04/2018   Hyperlipidemia 10/04/2018   Tobacco abuse 10/04/2018   Family history of heart disease 10/04/2018     REFERRING PROVIDER:  Delfin Gant, MD    REFERRING DIAG: [phone] (ICD-10-CM) - Pain in left knee   Rationale for Evaluation and Treatment: Rehabilitation  THERAPY DIAG:  Chronic pain of left knee  Difficulty in walking, not elsewhere classified  Muscle weakness (generalized)  ONSET DATE: 10/28/2020  (approximately)   SUBJECTIVE:  SUBJECTIVE STATEMENT: I took a nap after last session and woke up with a terrible pain in my upper back to shoulder.  Knee tried to give out on me earlier. Bad day.  FROM INITIAL EVALUATION Lt knee has been bothering me about 2 years, both are bad but Lt is worse. 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 Inflammatory arthritis. Dx with fibromyalgia. Both shoulders can't lift above shoulder height without pain. Both hands and feet hurt. Hips hurt. Bilateral groin pain. Lt lumbar spine/hip hurts to sit. Pain going down post Lt knee, sometimes past knee, use a cane because knee gives out sometimes. Achilles are tight. 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 Rt leg started occasionally will shake when I stand up- frequency depends on the pain.  Can only stand 15-20 min before pain. I can sit about 20-30 min.  A little dizzy sometimes but I think it's from the medications.  Has a recumb bike & treadmill at home.   PERTINENT HISTORY:  DM, PD, tobacco use  PAIN:  Are you having pain? Yes: NPRS scale: 8/10- Lt knee, 7/10 lower back  Pain location: see above  Pain description: sharp, burning Aggravating factors: constant Relieving factors: rest  PRECAUTIONS:  None  RED FLAGS: None   WEIGHT BEARING RESTRICTIONS:  No  FALLS:  Has patient fallen in last 6 months? No  LIVING ENVIRONMENT: Multi-story home, one hand rail on Rt side in ascending  OCCUPATION:  Not planning to return to work at this time.   PLOF:  Needs a shower chair or help to get out of tub, help with dressing- particularly bra and shoes  PATIENT GOALS:  Clean and cook again, go on trips again, back to being active  OBJECTIVE:  Note: Objective measures were completed at Evaluation unless otherwise noted.  PATIENT SURVEYS:  ABC scale 170 total LEFS 10/80  COGNITIVE STATUS: Within functional limits for tasks assessed   SENSATION: Both hands and feet get numb  EDEMA:  No  POSTURE:  forward head, increased thoracic kyphosis, and flexed trunk   GAIT: Distance walked: wait room to clinic & around clinic Assistive device utilized: Single point cane in Right hand Comments: slow cadence, pressure into cane, bil flat foot  strike, short step length   Body Part #1 Knee   LOWER EXTREMITY MMT:    MMT(lb) Right/Lt eval   Hip flexion    Hip extension    Hip abduction (sidelying) 3/5 // 3-/5   Hip adduction    Hip internal rotation    Hip external rotation    Knee flexion 7.0/6.8   Knee extension 9.1/6.2    (Blank rows = not tested)     FUNCTIONAL TESTS:  5 times sit to stand: only able to complete 2 sit<>stand in 1 min.  3 minute walk test: 58'10"     TREATMENT:  Pt seen for aquatic therapy today.  Treatment took place in water 3.5-4.75 ft in depth at the [facility] pool. Temp of water was 91.  Pt entered and exited the pool via stairs with supervision with heavy ue support on hand rails, exited using lift  * UE on barbell: walking forward x2 widths *side stepping length of pool to 4.6 ft R/L *standing ue rainbow hb horizontal add/abd x5-7 *baby [place]  *[place] x 5 R/L *modified doorway rhomboid stretch rue *forward and backwards marching 4.0 ft ue arm swing x 2 widths ea *Side stepping 4.29ft ue add/abd x 4 widths *decompression on yellow noodle-> cycling across 4.8 ft-> skiing *Standing ue support wall: relaxed squat; df /pf x 10 *Seated on lift: hamstring and gastroc stretch; adductor stretch  *Multiple squatted rest periods throughout session   Pt requires the buoyancy and hydrostatic pressure of water for support, and to offload joints by unweighting joint load by at least 50 % in navel deep water and by at least 75-80% in chest to neck deep water.  Viscosity of the water is needed for resistance of strengthening. Water current perturbations provides challenge to standing balance requiring increased core activation.      PATIENT EDUCATION:  Education details: intro to aquatic therapy  Person educated: Patient Education method: Explanation,  Demonstration, Tactile cues, Verbal cues, and Handouts Education comprehension: verbalized understanding, returned demonstration, verbal cues required, tactile cues required, and needs further education  HOME EXERCISE PROGRAM: [email or web address]    ASSESSMENT:  CLINICAL IMPRESSION: Pt reports rhomboid pain right after last session she attributes to struggling to get her bra on.  Has been constant since.  Spent some time today stretching and gentle movement decrease in pain. She tolerates session fair requiring multiple squatted recovery periods due to R rhomboid and L knee pain. Shoulder pain reduced minimally with aquatics today. She requires use of lift to exit pool with notable with rle tremor with standing and walking.  She Uses wc to and from therapy via dtr.     FROM EVAL Patient is a 57 y.o. F who was seen today for physical therapy evaluation and treatment for referral of left knee pain. Upon evaluation, noted that whole biomechanical chain is limited by significant weakness that does not support functional movement. Pt will begin in aquatics for strength building as current levels do not well support land- based treatment. Will benefit from skilled PT to address deficits and meet goals.     OBJECTIVE IMPAIRMENTS: Abnormal gait, decreased activity tolerance, decreased endurance, decreased mobility, difficulty walking, decreased strength, increased muscle spasms, impaired flexibility, impaired sensation, impaired UE functional use, improper body mechanics, postural dysfunction, and pain.   ACTIVITY LIMITATIONS: carrying, lifting, bending, sitting, standing, squatting, sleeping, stairs, transfers, bed mobility, bathing, toileting, dressing, reach over head, hygiene/grooming, locomotion level, and caring for others  PARTICIPATION LIMITATIONS: meal prep, cleaning, laundry, driving, shopping, community activity, and occupation  PERSONAL FACTORS:  see pmh  are also affecting patient's functional  outcome.   REHAB POTENTIAL: Fair    CLINICAL DECISION MAKING: Evolving/moderate complexity  EVALUATION COMPLEXITY: Moderate   GOALS: Goals reviewed with patient? Yes  SHORT TERM GOALS: Target date: 11/19/22  Begin a daily walking program Baseline:will educate and est as appropriate Goal status: In Progress 11/16/22  2.  Able to demo sit<>stand without trunk rotation & good use of glut set Baseline: began working at eval to reduce rotation to use Lt hand to get out of chair Goal status:  In progress 11/16/22  3.  Able to find a good working tolerance in aquatic exercise Baseline: to begin next visit  Goal status: In progress 11/16/22    LONG TERM GOALS: Target date: POC DATE  ABC scale improvements: stairs to 40%, bend over to pick up a slipper to 40% Baseline: both at 10% at eval Goal status: INITIAL  2.  Able to complete 5times sit to stand 2 min or less Baseline: unable to complete at eval Goal status: INITIAL  3.  LEFS to improve by MDC Baseline: MDC is 9 points Goal status: INITIAL  4.  3 min walk distance to at least 200 ft Baseline: see obj Goal status: INITIAL  5.  Knee pain avg <=5/10 Baseline: resting at 6 today, up to 10/10 and limiting Goal status: INITIAL    PLAN:  PT FREQUENCY: 1x/week  PT DURATION: 10 weeks  PLANNED INTERVENTIONS: 97146- PT Re-evaluation, 97110-Therapeutic exercises, 97530- Therapeutic activity, 97112- Neuromuscular re-education, 97535- Self Care, 13086- Manual therapy, [id number]- Gait training, [phone]- Aquatic Therapy, 97014- Electrical stimulation (unattended), Patient/Family education, Balance training, Stair training, Taping, Dry Needling, Joint mobilization, Spinal mobilization, Cryotherapy, and Moist heat.  PLAN FOR NEXT SESSION: begin aquatics, give walking guide handout  Rushie Chestnut) Evaan Tidwell MPT 11/16/22 6:25 PM [facility] [address] [place], [place],  [address] Phone: [phone]   Fax:  [phone]    Check all possible CPT codes: See Planned Interventions List for Planned CPT Codes    Check all conditions that are expected to impact treatment: {Conditions expected to impact treatment:Diabetes mellitus   If treatment provided at initial evaluation, no treatment charged due to lack of authorization.

## 2022-11-22 NOTE — Progress Notes (Deleted)
  Cardiology Office Note:  .   Date:  11/22/2022  ID:  Mary Campbell, DOB [birth year], MRN [id number] PCP: Ralene Ok, MD  [facility] Providers Cardiologist:  Reatha Harps, MD { Click to update primary MD,subspecialty MD or APP then REFRESH:1}   History of Present Illness: .   Mary Campbell is a 57 y.o. female with h below history who presents for follow-up.      Problem List 1. PAD -angioplasty L SFA 10/19/2018 and 04/02/2020 -R SFA angioplasty  -R SFA restenosis 50-99% 04/2022 -L SFA restenosis 50-99% 04/2022 2. Coronary calcifications on chest ct 3. DM -A1c 7.8 4. HLD -T chol 112, HDL 29, LDL 56, TG 154 5. Tobacco abuse 6. Obesity    ROS: All other ROS reviewed and negative. Pertinent positives noted in the HPI.     Studies Reviewed: Marland Kitchen       Physical Exam:   VS:  There were no vitals taken for this visit.   Wt Readings from Last 3 Encounters:  11/05/21 183 lb (83 kg)  06/24/21 178 lb 12.8 oz (81.1 kg)  11/23/20 187 lb (84.8 kg)    GEN: Well nourished, well developed in no acute distress NECK: No JVD; No carotid bruits CARDIAC: ***RRR, no murmurs, rubs, gallops RESPIRATORY:  Clear to auscultation without rales, wheezing or rhonchi  ABDOMEN: Soft, non-tender, non-distended EXTREMITIES:  No edema; No deformity  ASSESSMENT AND PLAN: .   ***    {Are you ordering a CV Procedure (e.g. stress test, cath, DCCV, TEE, etc)?   Press F2        :[id number]}   Follow-up: No follow-ups on file.  Time Spent with Patient: I have spent a total of *** minutes caring for this patient today face to face, ordering and reviewing labs/tests, reviewing prior records/medical history, examining the patient, establishing an assessment and plan, communicating results/findings to the patient/family, and documenting in the medical record.   Signed, Lenna Gilford. Flora Lipps, MD, [facility]  [facility]  [address] [place], [place] [address] [phone]  8:48  AM

## 2022-11-23 ENCOUNTER — Ambulatory Visit (HOSPITAL_BASED_OUTPATIENT_CLINIC_OR_DEPARTMENT_OTHER): Payer: Medicaid Other | Attending: Sports Medicine | Admitting: Physical Therapy

## 2022-11-23 ENCOUNTER — Encounter (HOSPITAL_BASED_OUTPATIENT_CLINIC_OR_DEPARTMENT_OTHER): Payer: Self-pay

## 2022-11-24 ENCOUNTER — Ambulatory Visit: Payer: Medicaid Other | Admitting: Cardiovascular Disease

## 2022-11-24 DIAGNOSIS — I10 Essential (primary) hypertension: Secondary | ICD-10-CM

## 2022-11-24 DIAGNOSIS — Z9862 Peripheral vascular angioplasty status: Secondary | ICD-10-CM

## 2022-11-24 DIAGNOSIS — E782 Mixed hyperlipidemia: Secondary | ICD-10-CM

## 2022-11-24 DIAGNOSIS — I739 Peripheral vascular disease, unspecified: Secondary | ICD-10-CM

## 2022-11-24 DIAGNOSIS — Z720 Tobacco use: Secondary | ICD-10-CM

## 2022-11-25 ENCOUNTER — Encounter (HOSPITAL_BASED_OUTPATIENT_CLINIC_OR_DEPARTMENT_OTHER): Payer: Self-pay

## 2022-11-25 ENCOUNTER — Ambulatory Visit (HOSPITAL_BASED_OUTPATIENT_CLINIC_OR_DEPARTMENT_OTHER): Payer: Medicaid Other | Admitting: Physical Therapy

## 2022-11-30 ENCOUNTER — Encounter (HOSPITAL_BASED_OUTPATIENT_CLINIC_OR_DEPARTMENT_OTHER): Payer: Self-pay | Admitting: Physical Therapy

## 2022-11-30 ENCOUNTER — Ambulatory Visit (HOSPITAL_BASED_OUTPATIENT_CLINIC_OR_DEPARTMENT_OTHER): Payer: Medicaid Other | Attending: Sports Medicine | Admitting: Physical Therapy

## 2022-11-30 DIAGNOSIS — G8929 Other chronic pain: Secondary | ICD-10-CM | POA: Insufficient documentation

## 2022-11-30 DIAGNOSIS — M6281 Muscle weakness (generalized): Secondary | ICD-10-CM | POA: Insufficient documentation

## 2022-11-30 DIAGNOSIS — M25562 Pain in left knee: Secondary | ICD-10-CM | POA: Diagnosis present

## 2022-11-30 DIAGNOSIS — R262 Difficulty in walking, not elsewhere classified: Secondary | ICD-10-CM | POA: Insufficient documentation

## 2022-11-30 NOTE — Therapy (Signed)
OUTPATIENT PHYSICAL THERAPY  TREATMENT   Patient Name: Mary Campbell MRN: [id number] DOB:11-08-1965, 57 y.o., female Today's Date: 11/30/2022  END OF SESSION:  PT End of Session - 11/30/22 1510     Visit Number 5    Number of Visits 10    Date for PT Re-Evaluation 01/07/23    PT Start Time 1505    PT Stop Time 1545    PT Time Calculation (min) 40 min    Activity Tolerance Patient tolerated treatment well    Behavior During Therapy WFL for tasks assessed/performed               Past Medical History:  Diagnosis Date   Chronic back pain    Diabetes mellitus    Hyperlipidemia    Hypertension    PAD (peripheral artery disease) (HCC)    a. PV angio 10/2018 - bilateral SFA disease s/p L intervention.   PAD (peripheral artery disease) (HCC)    Tobacco abuse    Past Surgical History:  Procedure Laterality Date   ABDOMINAL AORTOGRAM W/LOWER EXTREMITY Right 10/18/2018   Procedure: ABDOMINAL AORTOGRAM W/LOWER EXTREMITY;  Surgeon: Runell Gess, MD;  [facility]: [facility];  Service: Cardiovascular;  Laterality: Right;   ABDOMINAL AORTOGRAM W/LOWER EXTREMITY N/A 04/02/2020   Procedure: ABDOMINAL AORTOGRAM W/LOWER EXTREMITY;  Surgeon: Runell Gess, MD;  Location: [facility];  Service: Cardiovascular;  Laterality: N/A;   ABDOMINAL AORTOGRAM W/LOWER EXTREMITY N/A 04/23/2020   Procedure: ABDOMINAL AORTOGRAM W/LOWER EXTREMITY;  Surgeon: Runell Gess, MD;  Location: [facility];  Service: Cardiovascular;  Laterality: N/A;   ABDOMINAL HYSTERECTOMY     CHOLECYSTECTOMY     PERIPHERAL VASCULAR ATHERECTOMY Left 10/18/2018   Procedure: PERIPHERAL VASCULAR ATHERECTOMY;  Surgeon: Runell Gess, MD;  [facility]: [facility];  Service: Cardiovascular;  Laterality: Left;  SFA   PERIPHERAL VASCULAR ATHERECTOMY Right 11/22/2018   Procedure: PERIPHERAL VASCULAR ATHERECTOMY;  Surgeon: Runell Gess, MD;  Location: [facility];  Service:  Cardiovascular;  Laterality: Right;  SFA   PERIPHERAL VASCULAR ATHERECTOMY  04/23/2020   Procedure: PERIPHERAL VASCULAR ATHERECTOMY;  Surgeon: Runell Gess, MD;  Location: [facility];  Service: Cardiovascular;;   PERIPHERAL VASCULAR BALLOON ANGIOPLASTY Right 11/22/2018   Procedure: PERIPHERAL VASCULAR BALLOON ANGIOPLASTY;  Surgeon: Runell Gess, MD;  [facility];  Service: Cardiovascular;  Laterality: Right;  SFA   PERIPHERAL VASCULAR BALLOON ANGIOPLASTY  04/23/2020   Procedure: PERIPHERAL VASCULAR BALLOON ANGIOPLASTY;  Surgeon: Runell Gess, MD;  [facility];  Service: Cardiovascular;;   PERIPHERAL VASCULAR INTERVENTION Left 04/02/2020   Procedure: PERIPHERAL VASCULAR INTERVENTION;  Surgeon: Runell Gess, MD;  [facility]: [facility];  Service: Cardiovascular;  Laterality: Left;   Patient Active Problem List   Diagnosis Date Noted   Claudication in peripheral vascular disease (HCC) 10/04/2018   Essential hypertension 10/04/2018   Hyperlipidemia 10/04/2018   Tobacco abuse 10/04/2018   Family history of heart disease 10/04/2018     REFERRING PROVIDER:  Delfin Gant, MD    REFERRING DIAG: [phone] (ICD-10-CM) - Pain in left knee   Rationale for Evaluation and Treatment: Rehabilitation  THERAPY DIAG:  Chronic pain of left knee  Difficulty in walking, not elsewhere classified  Muscle weakness (generalized)  ONSET DATE: 10/28/2020  (approximately)   SUBJECTIVE:  SUBJECTIVE STATEMENT: The stretching we did of my upper back last time really helped.  FROM INITIAL EVALUATION Lt knee has been bothering me about 2 years, both are bad but Lt is worse. Wear and tear from working on concrete. Inflammatory arthritis. Dx with fibromyalgia. Both  shoulders can't lift above shoulder height without pain. Both hands and feet hurt. Hips hurt. Bilateral groin pain. Lt lumbar spine/hip hurts to sit. Pain going down post Lt knee, sometimes past knee, use a cane because knee gives out sometimes. Achilles are tight. 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 Rt leg started occasionally will shake when I stand up- frequency depends on the pain.  Can only stand 15-20 min before pain. I can sit about 20-30 min.  A little dizzy sometimes but I think it's from the medications.  Has a recumb bike & treadmill at home.   PERTINENT HISTORY:  DM, PD, tobacco use  PAIN:  Are you having pain? Yes: NPRS scale: 8/10- Lt knee, 7/10 lower back  Pain location: see above  Pain description: sharp, burning Aggravating factors: constant Relieving factors: rest  PRECAUTIONS:  None  RED FLAGS: None   WEIGHT BEARING RESTRICTIONS:  No  FALLS:  Has patient fallen in last 6 months? No  LIVING ENVIRONMENT: Multi-story home, one hand rail on Rt side in ascending  OCCUPATION:  Not planning to return to work at this time.   PLOF:  Needs a shower chair or help to get out of tub, help with dressing- particularly bra and shoes  PATIENT GOALS:  Clean and cook again, go on trips again, back to being active  OBJECTIVE:  Note: Objective measures were completed at Evaluation unless otherwise noted.  PATIENT SURVEYS:  ABC scale 170 total LEFS 10/80  COGNITIVE STATUS: Within functional limits for tasks assessed   SENSATION: Both hands and feet get numb  EDEMA:  No  POSTURE:  forward head, increased thoracic kyphosis, and flexed trunk   GAIT: Distance walked: wait room to clinic & around clinic Assistive device utilized: Single point cane in Right hand Comments: slow cadence, pressure into cane, bil flat foot strike, short step length   Body Part #1 Knee   LOWER EXTREMITY  MMT:    MMT(lb) Right/Lt eval   Hip flexion    Hip extension    Hip abduction (sidelying) 3/5 // 3-/5   Hip adduction    Hip internal rotation    Hip external rotation    Knee flexion 7.0/6.8   Knee extension 9.1/6.2    (Blank rows = not tested)     FUNCTIONAL TESTS:  5 times sit to stand: only able to complete 2 sit<>stand in 1 min.  3 minute walk test: 58'10"     TREATMENT:  Pt seen for aquatic therapy today.  Treatment took place in water 3.5-4.75 ft in depth at the [facility] pool. Temp of water was 91.  Pt entered and exited the pool via stairs with supervision with heavy ue support on hand rails, exited using lift  * UE on barbell: walking forward , backward and side stepping x2 widths  - short rest on wall with each lap *UE support wall: high knee marching; relaxed squats; hip add/abd *forward marching with arm swing x 2 widths *Seated on lift: hamstring and gastroc stretch; adductor stretch : LAQ x 10; hip add/abd x 10; cycling *Standing ue support wall: df/pf x 10 *decompression on yellow noodle-> cycling across 4.8 ft-> skiing *Multiple squatted rest periods throughout session   Pt requires the buoyancy and hydrostatic pressure of water for support, and to offload joints by unweighting joint load by at least 50 % in navel deep water and by at least 75-80% in chest to neck deep water.  Viscosity of the water is needed for resistance of strengthening. Water current perturbations provides challenge to standing balance requiring increased core activation.      PATIENT EDUCATION:  Education details: intro to aquatic therapy  Person educated: Patient Education method: Explanation, Demonstration, Tactile cues, Verbal cues, and Handouts Education comprehension: verbalized understanding, returned demonstration, verbal cues required,  tactile cues required, and needs further education  HOME EXERCISE PROGRAM: [email or web address]    ASSESSMENT:  CLINICAL IMPRESSION: Pt has been more limited by LBP last few session than knee pain. She reports the vary day to day.  Upper back/rhomboid pain from last session has subsided.  She tolerates progression of session well with increased le engagement.  She does continue to require multiple recovery periods.  Gastroc tightness decreases with exercise. Goals ongoing     FROM EVAL Patient is a 57 y.o. F who was seen today for physical therapy evaluation and treatment for referral of left knee pain. Upon evaluation, noted that whole biomechanical chain is limited by significant weakness that does not support functional movement. Pt will begin in aquatics for strength building as current levels do not well support land- based treatment. Will benefit from skilled PT to address deficits and meet goals.     OBJECTIVE IMPAIRMENTS: Abnormal gait, decreased activity tolerance, decreased endurance, decreased mobility, difficulty walking, decreased strength, increased muscle spasms, impaired flexibility, impaired sensation, impaired UE functional use, improper body mechanics, postural dysfunction, and pain.   ACTIVITY LIMITATIONS: carrying, lifting, bending, sitting, standing, squatting, sleeping, stairs, transfers, bed mobility, bathing, toileting, dressing, reach over head, hygiene/grooming, locomotion level, and caring for others  PARTICIPATION LIMITATIONS: meal prep, cleaning, laundry, driving, shopping, community activity, and occupation  PERSONAL FACTORS:  see pmh  are also affecting patient's functional outcome.   REHAB POTENTIAL: Fair    CLINICAL DECISION MAKING: Evolving/moderate complexity  EVALUATION COMPLEXITY: Moderate   GOALS: Goals reviewed with patient? Yes  SHORT TERM GOALS: Target date: 11/19/22  Begin a daily walking program Baseline:will educate and est as appropriate Goal  status: In Progress 11/16/22  2.  Able to demo sit<>stand without trunk rotation & good use of glut set Baseline: began working at eval to reduce rotation to use Lt hand to get out of chair Goal status:  In progress 11/16/22  3.  Able to find a good working tolerance in aquatic exercise Baseline: to begin next visit  Goal status: In progress 11/16/22    LONG TERM GOALS: Target date: POC DATE  ABC scale improvements: stairs  to 40%, bend over to pick up a slipper to 40% Baseline: both at 10% at eval Goal status: INITIAL  2.  Able to complete 5times sit to stand 2 min or less Baseline: unable to complete at eval Goal status: INITIAL  3.  LEFS to improve by MDC Baseline: MDC is 9 points Goal status: INITIAL  4.  3 min walk distance to at least 200 ft Baseline: see obj Goal status: INITIAL  5.  Knee pain avg <=5/10 Baseline: resting at 6 today, up to 10/10 and limiting Goal status: INITIAL    PLAN:  PT FREQUENCY: 1x/week  PT DURATION: 10 weeks  PLANNED INTERVENTIONS: 97146- PT Re-evaluation, 97110-Therapeutic exercises, 97530- Therapeutic activity, 97112- Neuromuscular re-education, 97535- Self Care, 25366- Manual therapy, [id number]- Gait training, [phone]- Aquatic Therapy, 97014- Electrical stimulation (unattended), Patient/Family education, Balance training, Stair training, Taping, Dry Needling, Joint mobilization, Spinal mobilization, Cryotherapy, and Moist heat.  PLAN FOR NEXT SESSION: begin aquatics, give walking guide handout  Rushie Chestnut) Mamie Hundertmark MPT 11/30/22 3:11 PM [facility] [address] [place], [place], [address] Phone: [phone]   Fax:  [phone]    Check all possible CPT codes: See Planned Interventions List for Planned CPT Codes    Check all conditions that are expected to impact treatment: {Conditions expected to impact treatment:Diabetes mellitus   If treatment provided at initial evaluation,  no treatment charged due to lack of authorization.

## 2022-12-07 ENCOUNTER — Encounter (HOSPITAL_BASED_OUTPATIENT_CLINIC_OR_DEPARTMENT_OTHER): Payer: Self-pay | Admitting: Physical Therapy

## 2022-12-07 ENCOUNTER — Ambulatory Visit (HOSPITAL_BASED_OUTPATIENT_CLINIC_OR_DEPARTMENT_OTHER): Payer: Medicaid Other | Admitting: Physical Therapy

## 2022-12-07 DIAGNOSIS — R262 Difficulty in walking, not elsewhere classified: Secondary | ICD-10-CM

## 2022-12-07 DIAGNOSIS — M6281 Muscle weakness (generalized): Secondary | ICD-10-CM

## 2022-12-07 DIAGNOSIS — G8929 Other chronic pain: Secondary | ICD-10-CM

## 2022-12-07 DIAGNOSIS — M25562 Pain in left knee: Secondary | ICD-10-CM | POA: Diagnosis not present

## 2022-12-07 NOTE — Therapy (Signed)
OUTPATIENT PHYSICAL THERAPY  TREATMENT   Patient Name: Mary Campbell MRN: [id number] DOB:06/03/1965, 57 y.o., female Today's Date: 12/07/2022  END OF SESSION:  PT End of Session - 12/07/22 1555     Visit Number 6    Number of Visits 10    Date for PT Re-Evaluation 01/07/23    PT Start Time 1503    PT Stop Time 1545    PT Time Calculation (min) 42 min    Activity Tolerance Patient tolerated treatment well    Behavior During Therapy WFL for tasks assessed/performed                Past Medical History:  Diagnosis Date   Chronic back pain    Diabetes mellitus    Hyperlipidemia    Hypertension    PAD (peripheral artery disease) (HCC)    a. PV angio 10/2018 - bilateral SFA disease s/p L intervention.   PAD (peripheral artery disease) (HCC)    Tobacco abuse    Past Surgical History:  Procedure Laterality Date   ABDOMINAL AORTOGRAM W/LOWER EXTREMITY Right 10/18/2018   Procedure: ABDOMINAL AORTOGRAM W/LOWER EXTREMITY;  Surgeon: Runell Gess, MD;  Location: [facility];  Service: Cardiovascular;  Laterality: Right;   ABDOMINAL AORTOGRAM W/LOWER EXTREMITY N/A 04/02/2020   Procedure: ABDOMINAL AORTOGRAM W/LOWER EXTREMITY;  Surgeon: Runell Gess, MD;  [facility]: [facility];  Service: Cardiovascular;  Laterality: N/A;   ABDOMINAL AORTOGRAM W/LOWER EXTREMITY N/A 04/23/2020   Procedure: ABDOMINAL AORTOGRAM W/LOWER EXTREMITY;  Surgeon: Runell Gess, MD;  [facility]: [facility];  Service: Cardiovascular;  Laterality: N/A;   ABDOMINAL HYSTERECTOMY     CHOLECYSTECTOMY     PERIPHERAL VASCULAR ATHERECTOMY Left 10/18/2018   Procedure: PERIPHERAL VASCULAR ATHERECTOMY;  Surgeon: Runell Gess, MD;  [facility]: [facility];  Service: Cardiovascular;  Laterality: Left;  SFA   PERIPHERAL VASCULAR ATHERECTOMY Right 11/22/2018   Procedure: PERIPHERAL VASCULAR ATHERECTOMY;  Surgeon: Runell Gess, MD;  Location: [facility] INVAS[facility];  Service:  Cardiovascular;  Laterality: Right;  SFA   PERIPHERAL VASCULAR ATHERECTOMY  04/23/2020   Procedure: PERIPHERAL VASCULAR ATHERECTOMY;  Surgeon: Runell Gess, MD;  Location: [facility] INV[facility];  Service: Cardiovascular;;   PERIPHERAL VASCULAR BALLOON ANGIOPLASTY Right 11/22/2018   Procedure: PERIPHERAL VASCULAR BALLOON ANGIOPLASTY;  Surgeon: Runell Gess, MD;  [facility]: [facility];  Service: Cardiovascular;  Laterality: Right;  SFA   PERIPHERAL VASCULAR BALLOON ANGIOPLASTY  04/23/2020   Procedure: PERIPHERAL VASCULAR BALLOON ANGIOPLASTY;  Surgeon: Runell Gess, MD;  [facility];  Service: Cardiovascular;;   PERIPHERAL VASCULAR INTERVENTION Left 04/02/2020   Procedure: PERIPHERAL VASCULAR INTERVENTION;  Surgeon: Runell Gess, MD;  [facility]: [facility];  Service: Cardiovascular;  Laterality: Left;   Patient Active Problem List   Diagnosis Date Noted   Claudication in peripheral vascular disease (HCC) 10/04/2018   Essential hypertension 10/04/2018   Hyperlipidemia 10/04/2018   Tobacco abuse 10/04/2018   Family history of heart disease 10/04/2018     REFERRING PROVIDER:  Delfin Gant, MD    REFERRING DIAG: [phone] (ICD-10-CM) - Pain in left knee   Rationale for Evaluation and Treatment: Rehabilitation  THERAPY DIAG:  Chronic pain of left knee  Difficulty in walking, not elsewhere classified  Muscle weakness (generalized)  ONSET DATE: 10/28/2020  (approximately)   SUBJECTIVE:  SUBJECTIVE STATEMENT: I fell going down the other night my right leg gave on me.  Went to [facility] yesterday for a re-assessment for my back and she ordered me a rollator.  Said I should continue with aquatic therapy.  My pain doesn't change really after therapy, I'm just  tired  FROM INITIAL EVALUATION Lt knee has been bothering me about 2 years, both are bad but Lt is worse. Wear and tear from working on concrete. Inflammatory arthritis. Dx with fibromyalgia. Both shoulders can't lift above shoulder height without pain. Both hands and feet hurt. Hips hurt. Bilateral groin pain. Lt lumbar spine/hip hurts to sit. Pain going down post Lt knee, sometimes past knee, use a cane because knee gives out sometimes. Achilles are tight. 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 Rt leg started occasionally will shake when I stand up- frequency depends on the pain.  Can only stand 15-20 min before pain. I can sit about 20-30 min.  A little dizzy sometimes but I think it's from the medications.  Has a recumb bike & treadmill at home.   PERTINENT HISTORY:  DM, PD, tobacco use  PAIN:  Are you having pain? Yes: NPRS scale: 10/10- Lt knee, 10/10 lower back  Pain location: see above  Pain description: sharp, burning Aggravating factors: constant Relieving factors: rest  PRECAUTIONS:  None  RED FLAGS: None   WEIGHT BEARING RESTRICTIONS:  No  FALLS:  Has patient fallen in last 6 months? No  LIVING ENVIRONMENT: Multi-story home, one hand rail on Rt side in ascending  OCCUPATION:  Not planning to return to work at this time.   PLOF:  Needs a shower chair or help to get out of tub, help with dressing- particularly bra and shoes  PATIENT GOALS:  Clean and cook again, go on trips again, back to being active  OBJECTIVE:  Note: Objective measures were completed at Evaluation unless otherwise noted.  PATIENT SURVEYS:  ABC scale 170 total LEFS 10/80  COGNITIVE STATUS: Within functional limits for tasks assessed   SENSATION: Both hands and feet get numb  EDEMA:  No  POSTURE:  forward head, increased thoracic kyphosis, and flexed trunk   GAIT: Distance walked: wait room to clinic &  around clinic Assistive device utilized: Single point cane in Right hand Comments: slow cadence, pressure into cane, bil flat foot strike, short step length   Body Part #1 Knee   LOWER EXTREMITY MMT:    MMT(lb) Right/Lt eval   Hip flexion    Hip extension    Hip abduction (sidelying) 3/5 // 3-/5   Hip adduction    Hip internal rotation    Hip external rotation    Knee flexion 7.0/6.8   Knee extension 9.1/6.2    (Blank rows = not tested)     FUNCTIONAL TESTS:  5 times sit to stand: only able to complete 2 sit<>stand in 1 min.  3 minute walk test: 58'10"     TREATMENT:  Pt seen for aquatic therapy today.  Treatment took place in water 3.5-4.75 ft in depth at the [facility] pool. Temp of water was 91.  Pt entered and exited the pool via stairs with supervision with heavy ue support on hand rails, exited using lift  * UE on barbell: walking forward , backward and side stepping x2 widths ea  - short rest on wall with every other  lap *UE support wall: high knee marching; relaxed squats; hip add/abd ; relaxed squats; hip flex/ext; Tr; Hr up to 10 reps ea as tolerated (resting between ea exercise) *decompression on yellow noodle-> cycling across 4.8 ft-> skiing *standing staggered stance Ue horizontal add/abd RB HB *Stair negotiation cues for leading left  Pt requires the buoyancy and hydrostatic pressure of water for support, and to offload joints by unweighting joint load by at least 50 % in navel deep water and by at least 75-80% in chest to neck deep water.  Viscosity of the water is needed for resistance of strengthening. Water current perturbations provides challenge to standing balance requiring increased core activation.      PATIENT EDUCATION:  Education details: intro to aquatic therapy  Person educated: Patient Education method:  Explanation, Demonstration, Tactile cues, Verbal cues, and Handouts Education comprehension: verbalized understanding, returned demonstration, verbal cues required, tactile cues required, and needs further education  HOME EXERCISE PROGRAM: [email or web address]    ASSESSMENT:  CLINICAL IMPRESSION: Pt reports increase in pain "all over".  My face has been going numb at night, I am going to see my pulmonologist. She continues to require multiple rest periods throughout session.  Appears to be tolerating therapy overall only fair although she may be in a fibro flare. She does negotiate steps to exit pool leading with lle with improved strength. If she leads with Rle she tends to tremor. She reports she has been unable to do any consist walking for exercise due to pain. Goals ongoing     FROM EVAL Patient is a 57 y.o. F who was seen today for physical therapy evaluation and treatment for referral of left knee pain. Upon evaluation, noted that whole biomechanical chain is limited by significant weakness that does not support functional movement. Pt will begin in aquatics for strength building as current levels do not well support land- based treatment. Will benefit from skilled PT to address deficits and meet goals.     OBJECTIVE IMPAIRMENTS: Abnormal gait, decreased activity tolerance, decreased endurance, decreased mobility, difficulty walking, decreased strength, increased muscle spasms, impaired flexibility, impaired sensation, impaired UE functional use, improper body mechanics, postural dysfunction, and pain.   ACTIVITY LIMITATIONS: carrying, lifting, bending, sitting, standing, squatting, sleeping, stairs, transfers, bed mobility, bathing, toileting, dressing, reach over head, hygiene/grooming, locomotion level, and caring for others  PARTICIPATION LIMITATIONS: meal prep, cleaning, laundry, driving, shopping, community activity, and occupation  PERSONAL FACTORS:  see pmh  are also affecting patient's  functional outcome.   REHAB POTENTIAL: Fair    CLINICAL DECISION MAKING: Evolving/moderate complexity  EVALUATION COMPLEXITY: Moderate   GOALS: Goals reviewed with patient? Yes  SHORT TERM GOALS: Target date: 11/19/22  Begin a daily walking program Baseline:will educate and est as appropriate Goal status: In Progress 11/16/22  2.  Able to demo sit<>stand without trunk rotation & good use of glut set Baseline: began working at eval to reduce rotation to use Lt hand to get out of chair Goal status:  In progress 11/16/22  3.  Able to find a good working tolerance in aquatic  exercise Baseline: to begin next visit  Goal status: In progress 11/16/22    LONG TERM GOALS: Target date: POC DATE  ABC scale improvements: stairs to 40%, bend over to pick up a slipper to 40% Baseline: both at 10% at eval Goal status: INITIAL  2.  Able to complete 5times sit to stand 2 min or less Baseline: unable to complete at eval Goal status: INITIAL  3.  LEFS to improve by MDC Baseline: MDC is 9 points Goal status: INITIAL  4.  3 min walk distance to at least 200 ft Baseline: see obj Goal status: INITIAL  5.  Knee pain avg <=5/10 Baseline: resting at 6 today, up to 10/10 and limiting Goal status: INITIAL    PLAN:  PT FREQUENCY: 1x/week  PT DURATION: 10 weeks  PLANNED INTERVENTIONS: 97146- PT Re-evaluation, 97110-Therapeutic exercises, 97530- Therapeutic activity, 97112- Neuromuscular re-education, 97535- Self Care, 23536- Manual therapy, [id number]- Gait training, [phone]- Aquatic Therapy, 97014- Electrical stimulation (unattended), Patient/Family education, Balance training, Stair training, Taping, Dry Needling, Joint mobilization, Spinal mobilization, Cryotherapy, and Moist heat.  PLAN FOR NEXT SESSION: begin aquatics, give walking guide handout  Rushie Chestnut) Mazi Schuff MPT 12/07/22 3:55 PM [facility] [address] [place], [place],  [address] Phone: [phone]   Fax:  [phone]    Check all possible CPT codes: See Planned Interventions List for Planned CPT Codes    Check all conditions that are expected to impact treatment: {Conditions expected to impact treatment:Diabetes mellitus   If treatment provided at initial evaluation, no treatment charged due to lack of authorization.

## 2022-12-10 NOTE — Progress Notes (Deleted)
Cardiology Office Note    Date:  12/10/2022  ID:  Mary Campbell, DOB Feb 22, 1965, MRN [id number] PCP:  Ralene Ok, MD  Cardiologist:  Reatha Harps, MD  Electrophysiologist:  None   Chief Complaint: ***  History of Present Illness: .    Mary Campbell is a 57 y.o. female with visit-pertinent history of PAD, DM, HTN, obesity, tobacco use.   First evaluated by Dr. Flora Lipps in 08/2018 for chest pain.  She had a low risk MPI in 09/2018 that showed no evidence of ischemia.  She had also noted symptoms of PAD and claudication, Doppler studies in 09/2018 revealed ABIs of 0.7 bilaterally with an occluded above-the-knee popliteal on the right and high frequency signal in the proximal left SFA.  On 10/18/2018 had a peripheral angiography revealing short segment CTO mid to distal right SFA with three-vessel runoff and 95% / 60% segmental proximal left SFA stenosis with three-vessel runoff.  She had directional atherectomy followed by drug-coated balloon angioplasty of the proximal left SFA.  On 11/2018 he underwent right SFA directional artherectomy followed by drug-coated balloon angioplasty.  In 03/2020 she had a repeat peripheral angiography revealing high-grade segmental mid left SFA stenosis with three-vessel runoff, Dr. Allyson Sabal performed directional atherectomy followed by drug-coated balloon angioplasty, and 04/2020 she had right SFA intervention with direct atherectomy followed by [facility] using distal protection.  She was last seen by Dr. Allyson Sabal 11/05/2021.   Today she presents for follow-up.  She reports that she  PAD: s/p bilateral SFA intervention in 2020, with repeat procedure in 2022 for claudication.  ABIs in 04/2022 showed bilateral resting ABIs indicate mild bilateral lower extremity arterial disease.  Patient is overdue for follow-up with Dr. Allyson Sabal, we will have her follow-up with him at next billable appointment.  Hypertension: Blood pressure today Continue current antihypertensive  regimen.  Hyperlipidemia: Last lipid profile on 09/27/2022 indicated total cholesterol 131, triglycerides 196, HDL 39 and LDL 60.   Tobacco abuse: Patient reports that she is currently smoking   Labwork independently reviewed: 09/27/2022: Hemoglobin 12.6, hematocrit 38.1 creatinine 1.16, sodium 141, potassium 4, AST 15, ALT 14  ROS: .   *** denies chest pain, shortness of breath, lower extremity edema, fatigue, palpitations, melena, hematuria, hemoptysis, diaphoresis, weakness, presyncope, syncope, orthopnea, and PND.  All other systems are reviewed and otherwise negative.  Studies Reviewed: Marland Kitchen    EKG:  EKG is ordered today, personally reviewed, demonstrating ***     CV Studies:  Cardiac Studies & Procedures     STRESS TESTS  MYOCARDIAL PERFUSION IMAGING 09/19/2018  Narrative  Nuclear stress EF: 70%.  There was no ST segment deviation noted during stress.  The study is normal.  This is a low risk study.  The left ventricular ejection fraction is hyperdynamic (>65%).  No ischemia.   ECHOCARDIOGRAM  ECHOCARDIOGRAM COMPLETE 09/19/2018  Narrative ECHOCARDIOGRAM REPORT    Patient Name:   Mary Campbell Date of Exam: 09/19/2018 Medical Rec #:  [id number]     Height:       65.0 in Accession #:    [id number]    Weight:       182.0 lb Date of Birth:  [birth year]     BSA:          1.90 m Patient Age:    53 years      BP:           124/58 mmHg Patient Gender: F  HR:           85 bpm. Exam Location:  [address]   Procedure: 2D Echo, Cardiac Doppler and Color Doppler  Indications:    R07.89  History:        Patient has no prior history of Echocardiogram examinations. Signs/Symptoms: Chest Pain Risk Factors: Hypertension, Dyslipidemia, Current Smoker and Diabetes.  Sonographer:    Samule Ohm RDCS Referring Phys: [id number] Lynda Rainwater A ACHARYA  IMPRESSIONS   1. There is moderate to severe thickening of the the right ventricular wall. There is a prominent  anterior pericardial fat pat so this observation of the RV possibly represents prominent epicardial fat. There is no reduced RV function or dilation to suggest a diagnosis of ARVD. Would recommend a cardiac MR for further characterization. 2. The left ventricle has hyperdynamic systolic function, with an ejection fraction of >65%. The cavity size was normal. Left ventricular diastolic Doppler parameters are consistent with impaired relaxation. No evidence of left ventricular regional wall motion abnormalities. 3. The right ventricle has normal systolic function. The cavity was normal. There is moderately increased right ventricular wall thickness. 4. Trivial pericardial effusion is present. 5. The mitral valve is grossly normal. 6. The tricuspid valve is grossly normal. 7. The aortic valve is tricuspid. Mild thickening of the aortic valve. No stenosis of the aortic valve. 8. The aorta is normal unless otherwise noted. 9. The aortic root and ascending aorta are normal in size and structure. 10. Prominent crista terminalis present. 11. When compared to the prior study: No prior.  FINDINGS Left Ventricle: The left ventricle has hyperdynamic systolic function, with an ejection fraction of >65%. The cavity size was normal. There is borderline increase in left ventricular wall thickness. Left ventricular diastolic Doppler parameters are consistent with impaired relaxation. No evidence of left ventricular regional wall motion abnormalities..  Right Ventricle: The right ventricle has normal systolic function. The cavity was normal. There is moderately increased right ventricular wall thickness. There is moderate to severe thickening of the the right ventricular wall. There is a prominent anterior pericardial fat pat so this observation of the RV possibly represents prominent epicardial fat. There is no reduced RV function or dilation to suggest a diagnosis of ARVD. Would recommend a cardiac MR for further  characterization.  Left Atrium: Left atrial size was normal in size.  Right Atrium: Right atrial size was normal in size. Right atrial pressure is estimated at 10 mmHg. Prominent crista terminalis present.  Interatrial Septum: No atrial level shunt detected by color flow Doppler.  Pericardium: Trivial pericardial effusion is present. There is a pericardial fat pad noted.  Mitral Valve: The mitral valve is grossly normal. Mitral valve regurgitation is trivial by color flow Doppler.  Tricuspid Valve: The tricuspid valve is grossly normal. Tricuspid valve regurgitation was not visualized by color flow Doppler.  Aortic Valve: The aortic valve is tricuspid Mild thickening of the aortic valve. Aortic valve regurgitation was not visualized by color flow Doppler. There is No stenosis of the aortic valve.  Pulmonic Valve: The pulmonic valve was grossly normal. Pulmonic valve regurgitation is not visualized by color flow Doppler.  Aorta: The aortic root and ascending aorta are normal in size and structure. The aorta is normal unless otherwise noted.  Venous: The inferior vena cava measures 1.30 cm, is normal in size with greater than 50% respiratory variability.  Compared to previous exam: No prior.   +--------------+--------++ LEFT VENTRICLE         +----------------+---------++ +--------------+--------++  Diastology                PLAX 2D                +----------------+---------++ +--------------+--------++ LV e' lateral:  5.55 cm/s LVIDd:        4.90 cm  +----------------+---------++ +--------------+--------++ LV E/e' lateral:15.6      LVIDs:        3.50 cm  +----------------+---------++ +--------------+--------++ LV e' medial:   5.55 cm/s LV PW:        1.10 cm  +----------------+---------++ +--------------+--------++ LV E/e' medial: 15.6      LV IVS:       1.20 cm  +----------------+---------++ +--------------+--------++ LVOT diam:     2.10 cm  +--------------+--------++ LV SV:        62 ml    +--------------+--------++ LV SV Index:  31.41    +--------------+--------++ LVOT Area:    3.46 cm +--------------+--------++                        +--------------+--------++  +---------------+----------++ RIGHT VENTRICLE           +---------------+----------++ RV S prime:    16.00 cm/s +---------------+----------++ TAPSE (M-mode):1.8 cm     +---------------+----------++  +---------------+-------++-----------++ LEFT ATRIUM           Index       +---------------+-------++-----------++ LA diam:       4.00 cm2.10 cm/m  +---------------+-------++-----------++ LA Vol (A2C):  58.1 ml30.57 ml/m +---------------+-------++-----------++ LA Vol (A4C):  46.5 ml24.47 ml/m +---------------+-------++-----------++ LA Biplane Vol:53.0 ml27.89 ml/m +---------------+-------++-----------++ +------------+---------++----------++ RIGHT ATRIUM         Index      +------------+---------++----------++ RA Pressure:3.00 mmHg           +------------+---------++----------++ RA Area:    9.04 cm            +------------+---------++----------++ RA Volume:  18.20 ml 9.58 ml/m +------------+---------++----------++ +------------+-----------++ AORTIC VALVE            +------------+-----------++ LVOT Vmax:  129.00 cm/s +------------+-----------++ LVOT Vmean: 89.100 cm/s +------------+-----------++ LVOT VTI:   0.288 m     +------------+-----------++  +-------------+-------++ AORTA                +-------------+-------++ Ao Root diam:3.60 cm +-------------+-------++ Ao Asc diam: 3.50 cm +-------------+-------++  +--------------+--------++    +---------------+---------++ MITRAL VALVE              TRICUSPID VALVE          +--------------+--------++    +---------------+---------++ MV Area (PHT):             Estimated RAP: 3.00 mmHg +--------------+--------++    +---------------+---------++                        +--------------+--------++    +--------------+-------+ MV Decel Time:346 msec    SHUNTS                +--------------+--------++    +--------------+-------+ +--------------+-----------++ Systemic VTI: 0.29 m  MV E velocity:86.40 cm/s  +--------------+-------+ +--------------+-----------++ Systemic Diam:2.10 cm MV A velocity:122.00 cm/s +--------------+-------+ +--------------+-----------++ MV E/A ratio: 0.71        +--------------+-----------++  +---------+-------+ IVC              +---------+-------+ IVC diam:1.30 cm +---------+-------+   Lennie Odor MD Electronically signed by Lennie Odor MD Signature Date/Time: 09/19/2018/11:35:24 AM    Final  Current Reported Medications:.    No outpatient medications have been marked as taking for the 12/12/22 encounter (Appointment) with Rip Harbour, NP.   Current Facility-Administered Medications for the 12/12/22 encounter (Appointment) with Reather Littler D, NP  Medication   sodium chloride flush (NS) 0.9 % injection 3 mL   sodium chloride flush (NS) 0.9 % injection 3 mL    Physical Exam:    VS:  There were no vitals taken for this visit.   Wt Readings from Last 3 Encounters:  11/05/21 183 lb (83 kg)  06/24/21 178 lb 12.8 oz (81.1 kg)  11/23/20 187 lb (84.8 kg)    GEN: Well nourished, well developed in no acute distress NECK: No JVD; No carotid bruits CARDIAC: ***RRR, no murmurs, rubs, gallops RESPIRATORY:  Clear to auscultation without rales, wheezing or rhonchi  ABDOMEN: Soft, non-tender, non-distended EXTREMITIES:  No edema; No acute deformity   Asessement and Plan:.     ***     Disposition: F/u with ***  Signed, Rip Harbour, NP

## 2022-12-12 ENCOUNTER — Ambulatory Visit: Payer: Medicaid Other | Admitting: Cardiology

## 2022-12-12 DIAGNOSIS — E782 Mixed hyperlipidemia: Secondary | ICD-10-CM

## 2022-12-12 DIAGNOSIS — Z720 Tobacco use: Secondary | ICD-10-CM

## 2022-12-12 DIAGNOSIS — I739 Peripheral vascular disease, unspecified: Secondary | ICD-10-CM

## 2022-12-12 DIAGNOSIS — Z9862 Peripheral vascular angioplasty status: Secondary | ICD-10-CM

## 2022-12-12 DIAGNOSIS — I10 Essential (primary) hypertension: Secondary | ICD-10-CM

## 2022-12-14 ENCOUNTER — Ambulatory Visit (HOSPITAL_BASED_OUTPATIENT_CLINIC_OR_DEPARTMENT_OTHER): Payer: Medicaid Other | Admitting: Physical Therapy

## 2022-12-18 NOTE — Progress Notes (Unsigned)
Cardiology Clinic Note   Patient Name: Mary Campbell Date of Encounter: 12/19/2022  Primary Care Provider:  Ralene Ok, MD Primary Cardiologist:  Reatha Harps, MD  Patient Profile    Mary Campbell 57 year old female presents to the clinic today for follow-up evaluation of her peripheral arterial disease.  Past Medical History    Past Medical History:  Diagnosis Date   Chronic back pain    Diabetes mellitus    Hyperlipidemia    Hypertension    PAD (peripheral artery disease) (HCC)    a. PV angio 10/2018 - bilateral SFA disease s/p L intervention.   PAD (peripheral artery disease) (HCC)    Tobacco abuse    Past Surgical History:  Procedure Laterality Date   ABDOMINAL AORTOGRAM W/LOWER EXTREMITY Right 10/18/2018   Procedure: ABDOMINAL AORTOGRAM W/LOWER EXTREMITY;  Surgeon: Runell Gess, MD;  Location: [facility];  Service: Cardiovascular;  Laterality: Right;   ABDOMINAL AORTOGRAM W/LOWER EXTREMITY N/A 04/02/2020   Procedure: ABDOMINAL AORTOGRAM W/LOWER EXTREMITY;  Surgeon: Runell Gess, MD;  Location: [facility];  Service: Cardiovascular;  Laterality: N/A;   ABDOMINAL AORTOGRAM W/LOWER EXTREMITY N/A 04/23/2020   Procedure: ABDOMINAL AORTOGRAM W/LOWER EXTREMITY;  Surgeon: Runell Gess, MD;  Location: [facility];  Service: Cardiovascular;  Laterality: N/A;   ABDOMINAL HYSTERECTOMY     CHOLECYSTECTOMY     PERIPHERAL VASCULAR ATHERECTOMY Left 10/18/2018   Procedure: PERIPHERAL VASCULAR ATHERECTOMY;  Surgeon: Runell Gess, MD;  Location: [facility];  Service: Cardiovascular;  Laterality: Left;  SFA   PERIPHERAL VASCULAR ATHERECTOMY Right 11/22/2018   Procedure: PERIPHERAL VASCULAR ATHERECTOMY;  Surgeon: Runell Gess, MD;  Location: [facility] INVASIVE CV LAB;  Service: Cardiovascular;  Laterality: Right;  SFA   PERIPHERAL VASCULAR ATHERECTOMY  04/23/2020   Procedure: PERIPHERAL VASCULAR ATHERECTOMY;  Surgeon: Runell Gess, MD;   Location: [facility] IN[facility] CV LAB;  Service: Cardiovascular;;   PERIPHERAL VASCULAR BALLOON ANGIOPLASTY Right 11/22/2018   Procedure: PERIPHERAL VASCULAR BALLOON ANGIOPLASTY;  Surgeon: Runell Gess, MD;  Location: [facility];  Service: Cardiovascular;  Laterality: Right;  SFA   PERIPHERAL VASCULAR BALLOON ANGIOPLASTY  04/23/2020   Procedure: PERIPHERAL VASCULAR BALLOON ANGIOPLASTY;  Surgeon: Runell Gess, MD;  Location: [facility];  Service: Cardiovascular;;   PERIPHERAL VASCULAR INTERVENTION Left 04/02/2020   Procedure: PERIPHERAL VASCULAR INTERVENTION;  Surgeon: Runell Gess, MD;  Location: [facility];  Service: Cardiovascular;  Laterality: Left;    Allergies  Allergies  Allergen Reactions   Codeine Hives   Lisinopril Cough   Sulfonamide Derivatives Hives    History of Present Illness    Mary Campbell has a PMH of chronic back pain, diabetes mellitus, HTN, HLD, tobacco abuse and PAD.  She was noted to have lifestyle limiting claudication and was referred by Dr. Flora Lipps to [facility] for evaluation.  She was seen and evaluated 10/22.  She reported 40-50-pack-year smoking history and had stopped smoking with the help of Chantix.  She reported that her father had multiple stents and MIs.  She had a negative nuclear stress test and normal echocardiogram.  She complained of claudication over the previous year and was symptomatic.  She was not bothered during her workday but did notice problems with walking longer distances and shopping.  She would have to stop due to cramping in her calves.  She had lower extremity ABIs 09/26/2018 which showed 0.7 bilateral occlusive disease above the popliteal on  the right and high frequency signal in the proximal left SFA.  She underwent peripheral angiography 10/18/2018 which showed short segment CTO mid-distal right SFA with three-vessel runoff and 95% / 60% segmental proximal left SFA stenosis and three-vessel runoff.  She received  directional arthrectomy followed by balloon angioplasty of her proximal left SFA with excellent result.  Her claudication resolved.  Her lower extremity Doppler showed left ABI of 0.84.  She wished to proceed with right SFA intervention.  She had right SFA directional arthrectomy followed by balloon angioplasty 11/22/2018.  Her lower extremity Doppler showed improved circulation and her claudication resolved.  She reported that she had stopped smoking 11/20/2018.  She denied leg pain at night and was able to ambulate without limitations.  She had repeat lower extremity Doppler studies 2/22 which showed restenosis of both SFAs.  She reported moderate symptoms and wished to proceed with percutaneous intervention.  She underwent angiography 04/02/2020 which showed mid left SFA stenosis with three-vessel runoff.  She had arthrectomy with angioplasty and her claudication resolved.  Her warmth in her foot returned.  She underwent right SFA intervention with directional arthrectomy followed by [facility] using distal protection.  Her follow-up lower extremity Dopplers 4/22 showed ABIs in the mid 0.8 range with patent bilateral SFAs.  Her claudication had resolved.  Her hemoglobin A1c remained elevated.  She was seen in follow-up by Dr. Allyson Sabal on 11/05/2021.  During that time she was doing well.  She denied claudication.  She noted left knee pain.  Her Dopplers 10/23 showed an ABI of 0.77 on the right and 0.93 on the left with high-frequency signals in both SFAs.  She continued to smoke a third of a pack per day.  She presents in the clinic today for follow-up evaluation and states she had a chest CT for cancer screening which showed aortic atherosclerosis and coronary plaque.  We reviewed this.  She was concerned about this.  She expressed understanding.  She does note some chest discomfort with prolonged periods of walking and increased physical activity.  Her discomfort dissipates with rest.  I offered stress testing.  She  wishes to defer at this time.  She reports that she has a pulmonology visit scheduled for this week.  I asked her to maintain a 2-week blood pressure log.  Her blood pressure initially today is 165/77 and on recheck it is 148/66.  She will provide log in 2 weeks.  I have given her salty 6 diet sheet and we will plan follow-up in 6 months..  Today she denies increased shortness of breath, lower extremity edema, fatigue, palpitations, melena, hematuria, hemoptysis, diaphoresis, weakness, presyncope, syncope, orthopnea, and PND.    Home Medications    Prior to Admission medications   Medication Sig Start Date End Date Taking? Authorizing Provider  acetaminophen (TYLENOL) 650 MG CR tablet Take 1,300 mg by mouth every 8 (eight) hours as needed for pain.    [provider]  aspirin EC 81 MG tablet Take 81 mg by mouth daily.    [provider]  Azilsartan-Chlorthalidone (EDARBYCLOR) 40-12.5 MG TABS Take 1 tablet by mouth daily.    [provider]  clopidogrel (PLAVIX) 75 MG tablet Take 1 tablet (75 mg total) by mouth daily. 07/07/22   Runell Gess, MD  diclofenac (VOLTAREN) 75 MG EC tablet as needed.    [provider]  diphenhydrAMINE (BENADRYL) 25 mg capsule Take 25 mg by mouth every 6 (six) hours as needed for allergies.  [provider]  fluticasone (FLONASE) 50 MCG/ACT nasal spray Place 1 spray into both nostrils 2 (two) times daily. 10/20/20   [provider]  glimepiride (AMARYL) 2 MG tablet Take 2 mg by mouth daily. 02/07/20   [provider]  ibuprofen (ADVIL) 200 MG tablet Take 400 mg by mouth every 6 (six) hours as needed for moderate pain or headache.    [provider]  insulin degludec (TRESIBA FLEXTOUCH) 100 UNIT/ML FlexTouch Pen Inject 42 Units into the skin at bedtime.    [provider]  metFORMIN (GLUCOPHAGE) 1000 MG tablet Take 1,000 mg by mouth 2 (two) times daily with a meal. 08/04/18   [provider]  nicotine polacrilex (COMMIT) 4 MG lozenge Take 4 mg by mouth daily as needed for smoking cessation.    [provider]  rosuvastatin (CRESTOR) 40 MG tablet Take 1 tablet (40 mg total) by mouth daily. 09/23/22   O'Neal, Ronnald Ramp, MD    Family History    Family History  Problem Relation Age of Onset   Diabetes Mother    Hypertension Mother    Hyperlipidemia Mother    Heart attack Father    Heart disease Father    Hypertension Father    Hyperlipidemia Father    Breast cancer Neg Hx    She indicated that her mother is alive. She indicated that her father is alive. She indicated that the status of her neg hx is unknown.  Social History    Social History   Socioeconomic History   Marital status: Legally Separated    Spouse name: Not on file   Number of children: Not on file   Years of education: Not on file   Highest education level: Not on file  Occupational History   Not on file  Tobacco Use   Smoking status: Former    Current packs/day: 0.00    Types: Cigarettes    Quit date: 03/30/2020    Years since quitting: 2.7   Smokeless tobacco: Never  Vaping Use   Vaping status: Never Used  Substance and Sexual Activity   Alcohol use: No   Drug use: No   Sexual activity: Yes    Birth control/protection: Surgical  Other Topics Concern   Not on file  Social History Narrative   Not on file   Social Determinants of Health   Financial Resource Strain: Not on file  Food Insecurity: Not on file  Transportation Needs: Not on file  Physical Activity: Not on file  Stress: Not on file  Social Connections: Not on file  Intimate Partner Violence: Not on file     Review of Systems    General:  No chills, fever, night sweats or weight changes.  Cardiovascular:  No chest pain, dyspnea on exertion, edema, orthopnea, palpitations, paroxysmal nocturnal dyspnea. Dermatological: No rash, lesions/masses Respiratory: No cough, dyspnea Urologic: No  hematuria, dysuria Abdominal:   No nausea, vomiting, diarrhea, bright red blood per rectum, melena, or hematemesis Neurologic:  No visual changes, wkns, changes in mental status. All other systems reviewed and are otherwise negative except as noted above.  Physical Exam    VS:  BP (!) 148/66   Pulse 95   Ht 5\' 5"  (1.651 m)   Wt 178 lb (80.7 kg)   SpO2 97%   BMI 29.62 kg/m  , BMI Body mass index is 29.62 kg/m. GEN: Well nourished, well developed, in no acute distress. HEENT: normal. Neck: Supple, no JVD, carotid bruits,  or masses. Cardiac: RRR, no murmurs, rubs, or gallops. No clubbing, cyanosis, edema.  Radials/DP/PT 2+ and equal bilaterally.  Respiratory:  Respirations regular and unlabored, clear to auscultation bilaterally. GI: Soft, nontender, nondistended, BS + x 4. MS: no deformity or atrophy. Skin: warm and dry, no rash. Neuro:  Strength and sensation are intact. Psych: Normal affect.  Accessory Clinical Findings    Recent Labs: No results found for requested labs within last 365 days.   Recent Lipid Panel    Component Value Date/Time   CHOL 118 03/11/2020 1005   TRIG 257 (H) 03/11/2020 1005   HDL 27 (L) 03/11/2020 1005   CHOLHDL 4.4 03/11/2020 1005   LDLCALC 50 03/11/2020 1005    HYPERTENSION CONTROL Vitals:   12/19/22 1515 12/19/22 1542  BP: (!) 165/77 (!) 148/66    The patient's blood pressure is elevated above target today.  In order to address the patient's elevated BP: Blood pressure will be monitored at home to determine if medication changes need to be made.       ECG personally reviewed by me today- EKG Interpretation Date/Time:  Monday December 19 2022 15:16:49 EST Ventricular Rate:  95 PR Interval:  142 QRS Duration:  76 QT Interval:  346 QTC Calculation: 434 R Axis:   25  Text Interpretation: Normal sinus rhythm Septal infarct , age undetermined When compared with ECG of 23-Nov-2020 11:15, PREVIOUS ECG IS PRESENT Confirmed by Edd Fabian [id number]) on 12/19/2022 3:26:20 PM  ABIs on 04/22/22  Right ABIs appear increased compared to prior study on 10/18/2021. Left  ABIs and bilateral TBIs appear essentially unchanged compared to prior  study on 10/18/2021.    Summary:  Right: Resting right ankle-brachial index indicates mild right lower  extremity arterial disease. The right toe-brachial index is abnormal.   Left: Resting left ankle-brachial index indicates mild left lower  extremity arterial disease. The left toe-brachial index is abnormal.    *See table(s) above for measurements and observations.  See LE Arterial duplex report.   Suggest follow up study in 12 months.     Assessment & Plan   1.  Peripheral arterial disease-denies claudication.  Has undergone bilateral lower extremity angiography and arthrectomy procedures by Dr. Allyson Sabal with the most recent in March 2022.  ABIs 04/22/2022 showed right ABI mild right lower extremity disease and left showed mild left lower extremity disease. Repeat ABIs and lower extremity arterial's 4/25 Continue aspirin, Plavix, rosuvastatin  Hyperlipidemia-LDL 50 on 03/11/2020.  Recent labs with PCP.  Patient will send.. High-fiber diet Increase physical activity as tolerated Continue current medical therapy  Essential hypertension-BP today 148/66.  Reports lower blood pressures at home. Heart healthy low-sodium diet 2-week blood pressure log.  Maintain blood pressure log  Tobacco abuse-has stopped smoking.  Smoking cessation strongly recommended Smoking cessation information provided  Coronary artery disease-noted on chest CT.  Does note chest discomfort with prolonged physical activity.  Chest pain dissipates after periods of rest.  Unclear whether this is related to pulmonary disease or coronary disease.  Noted to have coronary disease on recent chest CT.  I offered stress testing.  She wishes to defer at this time.  She is following up with pulmonology this  week.  Disposition: Follow-up with Dr.Berry or me in 6 months.   Thomasene Ripple. Diasha Castleman NP-C     12/19/2022, 3:44 PM [facility] [address] Office [phone] Fax [phone]    I spent 14 minutes examining this patient,  reviewing medications, and using patient centered shared decision making involving her cardiac care.   I spent greater than 20 minutes reviewing her past medical history,  medications, and prior cardiac tests.

## 2022-12-19 ENCOUNTER — Encounter: Payer: Self-pay | Admitting: General Practice

## 2022-12-19 ENCOUNTER — Ambulatory Visit: Payer: Medicaid Other | Attending: General Practice | Admitting: General Practice

## 2022-12-19 VITALS — BP 148/66 | HR 95 | Ht 65.0 in | Wt 178.0 lb

## 2022-12-19 DIAGNOSIS — E782 Mixed hyperlipidemia: Secondary | ICD-10-CM

## 2022-12-19 DIAGNOSIS — I739 Peripheral vascular disease, unspecified: Secondary | ICD-10-CM | POA: Diagnosis not present

## 2022-12-19 DIAGNOSIS — Z720 Tobacco use: Secondary | ICD-10-CM

## 2022-12-19 DIAGNOSIS — I10 Essential (primary) hypertension: Secondary | ICD-10-CM | POA: Diagnosis not present

## 2022-12-19 NOTE — Patient Instructions (Signed)
Medication Instructions:  No changes  Continue taking medications *If you need a refill on your cardiac medications before your next appointment, please call your pharmacy*   Follow-Up: At [facility], you and your health needs are our priority.  As part of our continuing mission to provide you with exceptional heart care, we have created designated Provider Care Teams.  These Care Teams include your primary Cardiologist (physician) and Advanced Practice Providers (APPs -  Physician Assistants and Nurse Practitioners) who all work together to provide you with the care you need, when you need it.    Your next appointment:   6 month(s)  Provider:   Reatha Harps, MD     Other Instructions Monitor Blood pressure for 2 weeks 1hr after taking medication check Blood pressure after 2 weeks send in log to [place]  Send [place] labs from pcp  Continue physical therapy for back

## 2022-12-19 NOTE — Progress Notes (Signed)
2-week blood pressure log.  Patient reports low blood pressures at home.

## 2022-12-21 ENCOUNTER — Encounter (HOSPITAL_BASED_OUTPATIENT_CLINIC_OR_DEPARTMENT_OTHER): Payer: Self-pay | Admitting: Physical Therapy

## 2022-12-21 ENCOUNTER — Ambulatory Visit (HOSPITAL_BASED_OUTPATIENT_CLINIC_OR_DEPARTMENT_OTHER): Payer: Medicaid Other | Attending: Sports Medicine | Admitting: Physical Therapy

## 2022-12-21 DIAGNOSIS — R262 Difficulty in walking, not elsewhere classified: Secondary | ICD-10-CM | POA: Insufficient documentation

## 2022-12-21 DIAGNOSIS — G8929 Other chronic pain: Secondary | ICD-10-CM | POA: Insufficient documentation

## 2022-12-21 DIAGNOSIS — M25562 Pain in left knee: Secondary | ICD-10-CM | POA: Insufficient documentation

## 2022-12-21 DIAGNOSIS — M6281 Muscle weakness (generalized): Secondary | ICD-10-CM | POA: Insufficient documentation

## 2022-12-21 NOTE — Therapy (Signed)
OUTPATIENT PHYSICAL THERAPY  TREATMENT   Patient Name: Mary Campbell MRN: [id number] DOB:Apr 04, 1965, 57 y.o., female Today's Date: 12/21/2022  END OF SESSION:  PT End of Session - 12/21/22 1505     Visit Number 7    Number of Visits 10    Date for PT Re-Evaluation 01/07/23    PT Start Time 1501    PT Stop Time 1545    PT Time Calculation (min) 44 min    Activity Tolerance Patient tolerated treatment well    Behavior During Therapy WFL for tasks assessed/performed                Past Medical History:  Diagnosis Date   Chronic back pain    Diabetes mellitus    Hyperlipidemia    Hypertension    PAD (peripheral artery disease) (HCC)    a. PV angio 10/2018 - bilateral SFA disease s/p L intervention.   PAD (peripheral artery disease) (HCC)    Tobacco abuse    Past Surgical History:  Procedure Laterality Date   ABDOMINAL AORTOGRAM W/LOWER EXTREMITY Right 10/18/2018   Procedure: ABDOMINAL AORTOGRAM W/LOWER EXTREMITY;  Surgeon: Runell Gess, MD;  Location: [facility];  Service: Cardiovascular;  Laterality: Right;   ABDOMINAL AORTOGRAM W/LOWER EXTREMITY N/A 04/02/2020   Procedure: ABDOMINAL AORTOGRAM W/LOWER EXTREMITY;  Surgeon: Runell Gess, MD;  Location: [facility];  Service: Cardiovascular;  Laterality: N/A;   ABDOMINAL AORTOGRAM W/LOWER EXTREMITY N/A 04/23/2020   Procedure: ABDOMINAL AORTOGRAM W/LOWER EXTREMITY;  Surgeon: Runell Gess, MD;  Location: [facility];  Service: Cardiovascular;  Laterality: N/A;   ABDOMINAL HYSTERECTOMY     CHOLECYSTECTOMY     PERIPHERAL VASCULAR ATHERECTOMY Left 10/18/2018   Procedure: PERIPHERAL VASCULAR ATHERECTOMY;  Surgeon: Runell Gess, MD;  Location: [facility];  Service: Cardiovascular;  Laterality: Left;  SFA   PERIPHERAL VASCULAR ATHERECTOMY Right 11/22/2018   Procedure: PERIPHERAL VASCULAR ATHERECTOMY;  Surgeon: Runell Gess, MD;  Location: [facility];  Service:  Cardiovascular;  Laterality: Right;  SFA   PERIPHERAL VASCULAR ATHERECTOMY  04/23/2020   Procedure: PERIPHERAL VASCULAR ATHERECTOMY;  Surgeon: Runell Gess, MD;  Location: [facility];  Service: Cardiovascular;;   PERIPHERAL VASCULAR BALLOON ANGIOPLASTY Right 11/22/2018   Procedure: PERIPHERAL VASCULAR BALLOON ANGIOPLASTY;  Surgeon: Runell Gess, MD;  Location: [facility];  Service: Cardiovascular;  Laterality: Right;  SFA   PERIPHERAL VASCULAR BALLOON ANGIOPLASTY  04/23/2020   Procedure: PERIPHERAL VASCULAR BALLOON ANGIOPLASTY;  Surgeon: Runell Gess, MD;  Location: [facility];  Service: Cardiovascular;;   PERIPHERAL VASCULAR INTERVENTION Left 04/02/2020   Procedure: PERIPHERAL VASCULAR INTERVENTION;  Surgeon: Runell Gess, MD;  Location: [facility];  Service: Cardiovascular;  Laterality: Left;   Patient Active Problem List   Diagnosis Date Noted   Claudication in peripheral vascular disease (HCC) 10/04/2018   Essential hypertension 10/04/2018   Hyperlipidemia 10/04/2018   Tobacco abuse 10/04/2018   Family history of heart disease 10/04/2018     REFERRING PROVIDER:  Delfin Gant, MD    REFERRING DIAG: [phone] (ICD-10-CM) - Pain in left knee   Rationale for Evaluation and Treatment: Rehabilitation  THERAPY DIAG:  Chronic pain of left knee  Difficulty in walking, not elsewhere classified  Muscle weakness (generalized)  ONSET DATE: 10/28/2020  (approximately)   SUBJECTIVE:  SUBJECTIVE STATEMENT: Pt reports another fall walking to mailbox.  Needed assistance to rise from ground. Has not received rollator.  Pt reports increasing difficulty climbing stairs   FROM INITIAL EVALUATION Lt knee has been bothering me about 2 years, both are bad but Lt is  worse. Wear and tear from working on concrete. Inflammatory arthritis. Dx with fibromyalgia. Both shoulders can't lift above shoulder height without pain. Both hands and feet hurt. Hips hurt. Bilateral groin pain. Lt lumbar spine/hip hurts to sit. Pain going down post Lt knee, sometimes past knee, use a cane because knee gives out sometimes. Achilles are tight. 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 Rt leg started occasionally will shake when I stand up- frequency depends on the pain.  Can only stand 15-20 min before pain. I can sit about 20-30 min.  A little dizzy sometimes but I think it's from the medications.  Has a recumb bike & treadmill at home.   PERTINENT HISTORY:  DM, PD, tobacco use  PAIN:  Are you having pain? Yes: NPRS scale: 8/10- Lt knee, 8/10 lower back  Pain location: see above  Pain description: sharp, burning Aggravating factors: constant Relieving factors: rest  PRECAUTIONS:  None  RED FLAGS: None   WEIGHT BEARING RESTRICTIONS:  No  FALLS:  Has patient fallen in last 6 months? No  LIVING ENVIRONMENT: Multi-story home, one hand rail on Rt side in ascending  OCCUPATION:  Not planning to return to work at this time.   PLOF:  Needs a shower chair or help to get out of tub, help with dressing- particularly bra and shoes  PATIENT GOALS:  Clean and cook again, go on trips again, back to being active  OBJECTIVE:  Note: Objective measures were completed at Evaluation unless otherwise noted.  PATIENT SURVEYS:  ABC scale 170 total LEFS 10/80  12/21/22: 8/80  COGNITIVE STATUS: Within functional limits for tasks assessed   SENSATION: Both hands and feet get numb  EDEMA:  No  POSTURE:  forward head, increased thoracic kyphosis, and flexed trunk   GAIT: Distance walked: wait room to clinic & around clinic Assistive device utilized: Single point cane in Right hand Comments:  slow cadence, pressure into cane, bil flat foot strike, short step length   Body Part #1 Knee   LOWER EXTREMITY MMT:    MMT(lb) Right/Lt eval Right / Left 12/21/22  Hip flexion    Hip extension    Hip abduction (sidelying) 3/5 // 3-/5   Hip adduction    Hip internal rotation    Hip external rotation    Knee flexion 7.0/6.8 7.7 / 8.1  Knee extension 9.1/6.2 11.1 / 9.0   (Blank rows = not tested)     FUNCTIONAL TESTS:  5 times sit to stand: only able to complete 2 sit<>stand in 1 min.  3 minute walk test: 58'10"     TREATMENT:  Pt seen for aquatic therapy today.  Treatment took place in water 3.5-4.75 ft in depth at the [facility] pool. Temp of water was 91.  Pt entered and exited the pool via stairs with supervision with heavy ue support on hand rails, exited using lift  * UE on barbell: walking forward , backward x2 widths ea  - short rest on wall with every other  lap *UE support wall: high knee marching; relaxed squats; hip add/abd ; relaxed squats; hip flex/ext; Tr; Hr x 10   -squatted rest period *side stepping x 2 width ue support barbell  -squatted rest period *standing staggered stance Ue horizontal add/abd RB HB *decompression on yellow noodle wrapped posteriorly across chest-> cycling across 4.8 ft-> skiing  Pt requires the buoyancy and hydrostatic pressure of water for support, and to offload joints by unweighting joint load by at least 50 % in navel deep water and by at least 75-80% in chest to neck deep water.  Viscosity of the water is needed for resistance of strengthening. Water current perturbations provides challenge to standing balance requiring increased core activation.  LEFS and muscle testing completed today      PATIENT EDUCATION:  Education details: intro to aquatic therapy  Person educated:  Patient Education method: Explanation, Demonstration, Tactile cues, Verbal cues, and Handouts Education comprehension: verbalized understanding, returned demonstration, verbal cues required, tactile cues required, and needs further education  HOME EXERCISE PROGRAM: [id number]    ASSESSMENT:  CLINICAL IMPRESSION: Pt continues to report frequent falls.  She continues also to have tremors in rle with movement be it walking or exercising. She does demonstrate improved toleration to activity today with reduction in reports of pain with movement. She reports decreased stiffness while submerged in warm water. LEFs completed with pt scoring 2 points less since initial eval. She has had a slight increase in strength as noted above in chart and reports compliance with home stretching and exercises as instructed. Goals ongoing    FROM EVAL Patient is a 57 y.o. F who was seen today for physical therapy evaluation and treatment for referral of left knee pain. Upon evaluation, noted that whole biomechanical chain is limited by significant weakness that does not support functional movement. Pt will begin in aquatics for strength building as current levels do not well support land- based treatment. Will benefit from skilled PT to address deficits and meet goals.     OBJECTIVE IMPAIRMENTS: Abnormal gait, decreased activity tolerance, decreased endurance, decreased mobility, difficulty walking, decreased strength, increased muscle spasms, impaired flexibility, impaired sensation, impaired UE functional use, improper body mechanics, postural dysfunction, and pain.   ACTIVITY LIMITATIONS: carrying, lifting, bending, sitting, standing, squatting, sleeping, stairs, transfers, bed mobility, bathing, toileting, dressing, reach over head, hygiene/grooming, locomotion level, and caring for others  PARTICIPATION LIMITATIONS: meal prep, cleaning, laundry, driving, shopping, community activity, and occupation  PERSONAL  FACTORS:  see pmh  are also affecting patient's functional outcome.   REHAB POTENTIAL: Fair    CLINICAL DECISION MAKING: Evolving/moderate complexity  EVALUATION COMPLEXITY: Moderate   GOALS: Goals reviewed with patient? Yes  SHORT TERM GOALS: Target date: 11/19/22  Begin a daily walking program Baseline:will educate and est as appropriate Goal status: In Progress 11/16/22  2.  Able to demo sit<>stand without trunk rotation & good use of glut set Baseline: began working at eval to reduce rotation to use Lt hand to get out of chair Goal status:  In progress 11/16/22  3.  Able to find a good working  tolerance in aquatic exercise Baseline: to begin next visit  Goal status: In progress 11/16/22.  Met 12/21/22    LONG TERM GOALS: Target date: POC DATE  ABC scale improvements: stairs to 40%, bend over to pick up a slipper to 40% Baseline: both at 10% at eval Goal status: INITIAL  2.  Able to complete 5times sit to stand 2 min or less Baseline: unable to complete at eval Goal status: INITIAL  3.  LEFS to improve by MDC Baseline: MDC is 9 points Goal status: in process 12/21/22  4.  3 min walk distance to at least 200 ft Baseline: see obj Goal status: INITIAL  5.  Knee pain avg <=5/10 Baseline: resting at 6 today, up to 10/10 and limiting Goal status: in process 12/21/22    PLAN:  PT FREQUENCY: 1x/week  PT DURATION: 10 weeks  PLANNED INTERVENTIONS: 97146- PT Re-evaluation, 97110-Therapeutic exercises, 97530- Therapeutic activity, 97112- Neuromuscular re-education, 97535- Self Care, 16109- Manual therapy, [phone]- Gait training, [phone]- Aquatic Therapy, 97014- Electrical stimulation (unattended), Patient/Family education, Balance training, Stair training, Taping, Dry Needling, Joint mobilization, Spinal mobilization, Cryotherapy, and Moist heat.  PLAN FOR NEXT SESSION: begin aquatics, give walking guide handout  Rushie Chestnut) Phillippe Orlick MPT 12/21/22 3:07 PM [facility] [address] [place], [place], [address] Phone: [phone]   Fax:  [phone]    Check all possible CPT codes: See Planned Interventions List for Planned CPT Codes    Check all conditions that are expected to impact treatment: {Conditions expected to impact treatment:Diabetes mellitus   If treatment provided at initial evaluation, no treatment charged due to lack of authorization.

## 2022-12-22 ENCOUNTER — Encounter: Payer: Self-pay | Admitting: Nurse Practitioner

## 2022-12-22 ENCOUNTER — Ambulatory Visit: Payer: Medicaid Other | Admitting: Nurse Practitioner

## 2022-12-22 VITALS — BP 156/64 | HR 94 | Ht 65.0 in | Wt 180.2 lb

## 2022-12-22 DIAGNOSIS — Z720 Tobacco use: Secondary | ICD-10-CM

## 2022-12-22 DIAGNOSIS — R0609 Other forms of dyspnea: Secondary | ICD-10-CM

## 2022-12-22 DIAGNOSIS — G4719 Other hypersomnia: Secondary | ICD-10-CM | POA: Insufficient documentation

## 2022-12-22 DIAGNOSIS — J432 Centrilobular emphysema: Secondary | ICD-10-CM | POA: Diagnosis not present

## 2022-12-22 DIAGNOSIS — R0683 Snoring: Secondary | ICD-10-CM | POA: Diagnosis not present

## 2022-12-22 MED ORDER — ALBUTEROL SULFATE HFA 108 (90 BASE) MCG/ACT IN AERS: 2.0000 | INHALATION_SPRAY | Freq: Four times a day (QID) | RESPIRATORY_TRACT | 2 refills | Status: DC | PRN

## 2022-12-22 NOTE — Assessment & Plan Note (Addendum)
Lung RADS 19 July 2022. Due July 2025 for repeat LDCT chest. Counseled to remain smoke free.

## 2022-12-22 NOTE — Assessment & Plan Note (Signed)
Suspect smoking related obstructive lung disease contributing to symptoms of DOE given smoking history and findings of emphysema on imaging. Will obtain PFTs for further evaluation. Provided with albuterol PRN to trial in interim. Teachback performed. Side effect profile reviewed. May consider scheduled bronchodilator therapy at follow up.

## 2022-12-22 NOTE — Progress Notes (Addendum)
@Patient  ID: Mary Campbell, female    DOB: [birth year], 57 y.o.   MRN: [id number]  Chief Complaint  Patient presents with   Consult    Sleep Consult    Referring provider: Ralene Ok, MD  HPI: 57 year old female, former smoker referred for sleep consult.  Past medical history significant for hypertension, PVD, HLD, emphysema, allergic rhinitis, neuropathy, DM on insulin.   TEST/EVENTS:  07/18/2022 LDCT chest: Atherosclerosis.  No LAD.  Multiple small pulmonary nodules bilaterally, largest of which 3.4 mm.  Lung RADS 2S.  Mild diffuse bronchial wall thickening with very mild centrilobular and paraseptal emphysema.  Right adrenal nodule, similar to prior and compatible with benign adenoma.  12/22/2022: Today-sleep consult Discussed the use of AI scribe software for clinical note transcription with the patient, who gave verbal consent to proceed.  History of Present Illness   The patient presents today for sleep consult with significant daytime sleepiness and snoring. She reports waking up feeling short of breath and has experienced drowsy driving in the past, although not recently due to limited driving distances. She also reports morning headaches and dry mouth upon waking. Sleep patterns are inconsistent, with difficulty falling asleep at times, leading to daytime sleep and disrupted nocturnal sleep. She denies any sleep parasomnias/paralysis. She denies use of  sleep aids.  Goes to bed around 11 PM.  Sleep onset varies.  Typically wakes at least 4 times a night.  Usually gets up around 1030 to 11 AM.  Does nap during the day.  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  Weight has been relatively stable over the last 2 years.  Never had a previous sleep study.  Does not use any supplemental oxygen.  Lives with her daughter.  Works as a Manufacturing systems engineer.  Family history of emphysema, allergies, asthma, heart disease, coagulation disorders, rheumatism and cancer.  She has a history of COPD per  her report, but is not currently on any medications for this condition. She experiences shortness of breath, particularly when climbing stairs or walking long distances, and uses a cane due to back and knee pain from fibromyalgia and arthritis. She also reports a cough, particularly in the morning and at night, that is productive with white phlegm. She denies any hemoptysis, fevers, chills, wheezing, leg swelling, orthopnea, PND, palpitations, anorexia.   She has a history of smoking, but quit in July. She has never had lung function testing and does not have a rescue inhaler. She undergoes annual lung cancer screening CTs, the most recent of which showed small scattered nodules and evidence of emphysema and chronic bronchitis.      Epworth 20  Allergies  Allergen Reactions   Codeine Hives   Lisinopril Cough   Sulfonamide Derivatives Hives    Immunization History  Administered Date(s) Administered   Influenza-Unspecified 10/19/2020   Pneumococcal-Unspecified 10/19/2020    Past Medical History:  Diagnosis Date   Chronic back pain    Diabetes mellitus    Hyperlipidemia    Hypertension    PAD (peripheral artery disease) (HCC)    a. PV angio 10/2018 - bilateral SFA disease s/p L intervention.   PAD (peripheral artery disease) (HCC)    Tobacco abuse     Tobacco History: Social History   Tobacco Use  Smoking Status Former   Current packs/day: 0.00   Types: Cigarettes   Quit date: 08/11/2022   Years since quitting: 0.3  Smokeless Tobacco Never   Counseling given: Not Answered   Outpatient Medications Prior  to Visit  Medication Sig Dispense Refill   acetaminophen (TYLENOL) 650 MG CR tablet Take 1,300 mg by mouth every 8 (eight) hours as needed for pain.     amLODipine-benazepril (LOTREL) 5-20 MG capsule Take 1 capsule by mouth daily.     aspirin EC 81 MG tablet Take 81 mg by mouth daily.     clopidogrel (PLAVIX) 75 MG tablet Take 1 tablet (75 mg total) by mouth daily. 90  tablet 1   diphenhydrAMINE (BENADRYL) 25 mg capsule Take 25 mg by mouth every 6 (six) hours as needed for allergies.     DULoxetine (CYMBALTA) 60 MG capsule Take 60 mg by mouth daily.     fluticasone (FLONASE) 50 MCG/ACT nasal spray Place 1 spray into both nostrils 2 (two) times daily.     gabapentin (NEURONTIN) 300 MG capsule Take 300 mg by mouth 2 (two) times daily.     glimepiride (AMARYL) 2 MG tablet Take 2 mg by mouth daily.     ibuprofen (ADVIL) 200 MG tablet Take 400 mg by mouth every 6 (six) hours as needed for moderate pain (pain score 4-6) or headache.     insulin glargine (LANTUS) 100 UNIT/ML injection Inject 48 Units into the skin at bedtime.     metFORMIN (GLUCOPHAGE) 1000 MG tablet Take 1,000 mg by mouth 2 (two) times daily with a meal.     nicotine polacrilex (COMMIT) 4 MG lozenge Take 4 mg by mouth daily as needed for smoking cessation.     rosuvastatin (CRESTOR) 40 MG tablet Take 1 tablet (40 mg total) by mouth daily. 90 tablet 0   Azilsartan-Chlorthalidone (EDARBYCLOR) 40-12.5 MG TABS Take 1 tablet by mouth daily. (Patient not taking: Reported on 12/19/2022)     diclofenac (VOLTAREN) 75 MG EC tablet as needed. (Patient not taking: Reported on 12/19/2022)     insulin degludec (TRESIBA FLEXTOUCH) 100 UNIT/ML FlexTouch Pen Inject 42 Units into the skin at bedtime. (Patient not taking: Reported on 12/19/2022)     Facility-Administered Medications Prior to Visit  Medication Dose Route Frequency Provider Last Rate Last Admin   sodium chloride flush (NS) 0.9 % injection 3 mL  3 mL Intravenous Q12H Runell Gess, MD       sodium chloride flush (NS) 0.9 % injection 3 mL  3 mL Intravenous Q12H Runell Gess, MD         Review of Systems:   Constitutional: No weight loss or gain, night sweats, fevers, chills, or lassitude. +fatigue  HEENT: No difficulty swallowing, tooth/dental problems, or sore throat. No sneezing, itching, ear ache, nasal congestion, or post nasal drip. +AM  headaches  CV:  No chest pain, orthopnea, PND, swelling in lower extremities, anasarca, dizziness, palpitations, syncope Resp: +snoring; shortness of breath with exertion; chronic cough. No excess mucus or change in color of mucus. No hemoptysis. No wheezing.  No chest wall deformity GI:  No heartburn, indigestion, abdominal pain, nausea, vomiting, diarrhea, change in bowel habits, loss of appetite, bloody stools.  GU: No dysuria, change in color of urine, urgency or frequency.  No flank pain, no hematuria  Skin: No rash, lesions, ulcerations MSK:  +chronic back and joint pains  Neuro: No dizziness or lightheadedness.  Psych: +stable depression, anxiety. Mood stable. +sleep disturbance     Physical Exam:  BP (!) 156/64 (BP Location: Right Arm, Cuff Size: Normal)   Pulse 94   Ht 5\' 5"  (1.651 m)   Wt 180 lb 3.2 oz (81.7 kg)  SpO2 95%   BMI 29.99 kg/m   GEN: Pleasant, interactive, well-kempt; in no acute distress HEENT:  Normocephalic and atraumatic. PERRLA. Sclera white. Nasal turbinates pink, moist and patent bilaterally. No rhinorrhea present. Oropharynx pink and moist, without exudate or edema. No lesions, ulcerations, or postnasal drip. Mallampati IV NECK:  Supple w/ fair ROM. No JVD present. Normal carotid impulses w/o bruits. Thyroid symmetrical with no goiter or nodules palpated. No lymphadenopathy.   CV: RRR, no m/r/g, no peripheral edema. Pulses intact, +2 bilaterally. No cyanosis, pallor or clubbing. PULMONARY:  Unlabored, regular breathing. Clear bilaterally A&P w/o wheezes/rales/rhonchi. No accessory muscle use.  GI: BS present and normoactive. Soft, non-tender to palpation. No organomegaly or masses detected.  MSK: No erythema, warmth or tenderness. Cap refil <2 sec all extrem. No deformities or joint swelling noted.  Neuro: A/Ox3. No focal deficits noted.   Skin: Warm, no lesions or rashe Psych: Normal affect and behavior. Judgement and thought content appropriate.      Lab Results:  CBC    Component Value Date/Time   WBC 12.6 (H) 11/23/2020 1125   RBC 4.38 11/23/2020 1125   HGB 12.4 11/23/2020 1125   HGB 12.0 04/15/2020 1537   HCT 36.6 11/23/2020 1125   HCT 36.5 04/15/2020 1537   PLT 326 11/23/2020 1125   PLT 323 04/15/2020 1537   MCV 83.6 11/23/2020 1125   MCV 87 04/15/2020 1537   MCH 28.3 11/23/2020 1125   MCHC 33.9 11/23/2020 1125   RDW 15.7 (H) 11/23/2020 1125   RDW 14.4 04/15/2020 1537   LYMPHSABS 2.4 11/23/2020 1125   LYMPHSABS 2.5 10/25/2018 1552   MONOABS 0.6 11/23/2020 1125   EOSABS 0.1 11/23/2020 1125   EOSABS 0.2 10/25/2018 1552   BASOSABS 0.1 11/23/2020 1125   BASOSABS 0.1 10/25/2018 1552    BMET    Component Value Date/Time   NA 132 (L) 11/23/2020 1125   NA 139 04/15/2020 1537   K 4.0 11/23/2020 1125   CL 97 (L) 11/23/2020 1125   CO2 27 11/23/2020 1125   GLUCOSE 132 (H) 11/23/2020 1125   BUN 12 11/23/2020 1125   BUN 9 04/15/2020 1537   CREATININE 1.00 11/23/2020 1125   CALCIUM 9.2 11/23/2020 1125   GFRNONAA >60 11/23/2020 1125   GFRAA 73 03/11/2020 1005    BNP No results found for: "BNP"   Imaging:  No results found.  Administration History     None           No data to display          No results found for: "NITRICOXIDE"      Assessment & Plan:   Excessive daytime sleepiness She has snoring, excessive daytime sleepiness, nocturnal apneic events, morning headaches, restless sleep. Epworth 20. Given this,  I am concerned she could have sleep disordered breathing with obstructive sleep apnea. She will need sleep study for further evaluation.    - discussed how weight can impact sleep and risk for sleep disordered breathing - discussed options to assist with weight loss: combination of diet modification, cardiovascular and strength training exercises   - had an extensive discussion regarding the adverse health consequences related to untreated sleep disordered breathing -  specifically discussed the risks for hypertension, coronary artery disease, cardiac dysrhythmias, cerebrovascular disease, and diabetes - lifestyle modification discussed   - discussed how sleep disruption can increase risk of accidents, particularly when driving - safe driving practices were discussed   Patient Instructions  Given your symptoms, I  am concerned that you may have sleep disordered breathing with sleep apnea. You will need a sleep study for further evaluation. Someone will contact you to schedule this.   We discussed how untreated sleep apnea puts an individual at risk for cardiac arrhthymias, pulm HTN, DM, stroke and increases their risk for daytime accidents. We also briefly reviewed treatment options including weight loss, side sleeping position, oral appliance, CPAP therapy or referral to ENT for possible surgical options  Use caution when driving and pull over if you become sleepy.  Trial Albuterol inhaler 2 puffs every 6 hours as needed for shortness of breath or wheezing. Notify if symptoms persist despite rescue inhaler/neb use.   Lung function testing when you come back  Continue with lung cancer screening program - next due July 2025  Follow up in 6-8 weeks after full PFT and HST with [name] Andreika Vandagriff,NP. If symptoms do not improve or worsen, please contact office for sooner follow up or seek emergency care.     DOE (dyspnea on exertion) Suspect smoking related obstructive lung disease contributing to symptoms of DOE given smoking history and findings of emphysema on imaging. Will obtain PFTs for further evaluation. Provided with albuterol PRN to trial in interim. Teachback performed. Side effect profile reviewed. May consider scheduled bronchodilator therapy at follow up.   Centrilobular emphysema (HCC) See above  Tobacco abuse Lung RADS 19 July 2022. Due July 2025 for repeat LDCT chest. Counseled to remain smoke free.    Advised if symptoms do not improve or  worsen, to please contact office for sooner follow up or seek emergency care.   I spent 45 minutes of dedicated to the care of this patient on the date of this encounter to include pre-visit review of records, face-to-face time with the patient discussing conditions above, post visit ordering of testing, clinical documentation with the electronic health record, making appropriate referrals as documented, and communicating necessary findings to members of the patients care team.  Noemi Chapel, NP 12/22/2022  Pt aware and understands NP's role.

## 2022-12-22 NOTE — Assessment & Plan Note (Signed)
See above

## 2022-12-22 NOTE — Assessment & Plan Note (Signed)
She has snoring, excessive daytime sleepiness, nocturnal apneic events, morning headaches, restless sleep. Epworth 20. Given this,  I am concerned she could have sleep disordered breathing with obstructive sleep apnea. She will need sleep study for further evaluation.    - discussed how weight can impact sleep and risk for sleep disordered breathing - discussed options to assist with weight loss: combination of diet modification, cardiovascular and strength training exercises   - had an extensive discussion regarding the adverse health consequences related to untreated sleep disordered breathing - specifically discussed the risks for hypertension, coronary artery disease, cardiac dysrhythmias, cerebrovascular disease, and diabetes - lifestyle modification discussed   - discussed how sleep disruption can increase risk of accidents, particularly when driving - safe driving practices were discussed   Patient Instructions  Given your symptoms, I am concerned that you may have sleep disordered breathing with sleep apnea. You will need a sleep study for further evaluation. Someone will contact you to schedule this.   We discussed how untreated sleep apnea puts an individual at risk for cardiac arrhthymias, pulm HTN, DM, stroke and increases their risk for daytime accidents. We also briefly reviewed treatment options including weight loss, side sleeping position, oral appliance, CPAP therapy or referral to ENT for possible surgical options  Use caution when driving and pull over if you become sleepy.  Trial Albuterol inhaler 2 puffs every 6 hours as needed for shortness of breath or wheezing. Notify if symptoms persist despite rescue inhaler/neb use.   Lung function testing when you come back  Continue with lung cancer screening program - next due July 2025  Follow up in 6-8 weeks after full PFT and HST with [name] Xitlaly Ault,NP. If symptoms do not improve or worsen, please contact office for sooner  follow up or seek emergency care.

## 2022-12-22 NOTE — Patient Instructions (Signed)
Given your symptoms, I am concerned that you may have sleep disordered breathing with sleep apnea. You will need a sleep study for further evaluation. Someone will contact you to schedule this.   We discussed how untreated sleep apnea puts an individual at risk for cardiac arrhthymias, pulm HTN, DM, stroke and increases their risk for daytime accidents. We also briefly reviewed treatment options including weight loss, side sleeping position, oral appliance, CPAP therapy or referral to ENT for possible surgical options  Use caution when driving and pull over if you become sleepy.  Trial Albuterol inhaler 2 puffs every 6 hours as needed for shortness of breath or wheezing. Notify if symptoms persist despite rescue inhaler/neb use.   Lung function testing when you come back  Continue with lung cancer screening program - next due July 2025  Follow up in 6-8 weeks after full PFT and HST with Mary Leatha Rohner,NP. If symptoms do not improve or worsen, please contact office for sooner follow up or seek emergency care.

## 2022-12-28 ENCOUNTER — Ambulatory Visit (HOSPITAL_BASED_OUTPATIENT_CLINIC_OR_DEPARTMENT_OTHER): Payer: Medicaid Other | Admitting: Physical Therapy

## 2022-12-28 ENCOUNTER — Encounter (HOSPITAL_BASED_OUTPATIENT_CLINIC_OR_DEPARTMENT_OTHER): Payer: Self-pay

## 2022-12-29 NOTE — Telephone Encounter (Signed)
We could look at getting her set up with one of our HST boxes but I'm not sure what the cost will be. Could we look into this?

## 2022-12-29 NOTE — Telephone Encounter (Signed)
Mary Campbell, Please see patient message regarding being unable to do HST d/t insurance reasons and advise.  Thank you.

## 2022-12-31 NOTE — Telephone Encounter (Signed)
What about placing order for pt to do in-lab study since her insurance will not cover the home sleep study?

## 2023-01-02 NOTE — Telephone Encounter (Signed)
Holly, do we have alternative options other than snap? Could she do a HST from [place] or would cost be the same?  Thanks!

## 2023-01-03 ENCOUNTER — Encounter (HOSPITAL_BASED_OUTPATIENT_CLINIC_OR_DEPARTMENT_OTHER): Payer: Self-pay

## 2023-01-03 ENCOUNTER — Ambulatory Visit (HOSPITAL_BASED_OUTPATIENT_CLINIC_OR_DEPARTMENT_OTHER): Payer: Medicaid Other | Admitting: Physical Therapy

## 2023-01-05 ENCOUNTER — Encounter (INDEPENDENT_AMBULATORY_CARE_PROVIDER_SITE_OTHER): Payer: Medicaid Other

## 2023-01-05 DIAGNOSIS — Z79899 Other long term (current) drug therapy: Secondary | ICD-10-CM

## 2023-01-05 DIAGNOSIS — R0789 Other chest pain: Secondary | ICD-10-CM | POA: Diagnosis not present

## 2023-01-05 MED ORDER — METOPROLOL SUCCINATE ER 25 MG PO TB24: 12.5000 mg | ORAL_TABLET | Freq: Every day | ORAL | 3 refills | Status: DC

## 2023-01-05 NOTE — Telephone Encounter (Signed)
Pt WIFE (DPR) informed of providers recommendations. Pt WIFE verbalized understanding. All questions, if any, were answered. ORDERS ENTERED.

## 2023-01-05 NOTE — Telephone Encounter (Signed)
Please contact Ms. Padrick and let her know that I have reviewed her blood pressure log.  I will prescribe 12.5 mg of metoprolol succinate daily for hypertension, order BMP, CBC, and order nuclear stress test for chest discomfort.  Please schedule follow-up for after her stress testing.  Thank you for your help.  Thomasene Ripple. Danney Bungert NP-C     01/05/2023, 4:19 PM [facility] [address] Office [phone] Fax [phone]

## 2023-01-05 NOTE — Addendum Note (Signed)
Addended by: Alyson Ingles on: 01/05/2023 04:49 PM   Modules accepted: Orders

## 2023-01-10 ENCOUNTER — Other Ambulatory Visit: Payer: Self-pay | Admitting: Cardiovascular Disease

## 2023-01-19 ENCOUNTER — Telehealth (HOSPITAL_COMMUNITY): Payer: Self-pay

## 2023-01-19 NOTE — Telephone Encounter (Signed)
 Detailed instructions left on the patient's answering machine. Asked to call back with any questions.  S.Alessa Mazur CCT

## 2023-01-21 IMAGING — CT CT ABD-PELV W/ CM
2 of 5 series · 16 of 46 positions shown, 18 images · IV contrast (OMNIPAQUE 350)
Comparison: None.

CLINICAL DATA: Abdominal pain, acute, nonlocalized

EXAM:
CT ABDOMEN AND PELVIS WITH CONTRAST
TECHNIQUE: Multidetector CT imaging of the abdomen and pelvis was performed
using the standard protocol following bolus administration of
intravenous contrast.
CONTRAST:  80mL OMNIPAQUE IOHEXOL 350 MG/ML SOLN

[Series 2: axial st · axial · 0.87mm/px · z∈[+64,+434]mm · 13 of 88 slices shown, 15 images]
[im 7/88  soft-tissue]
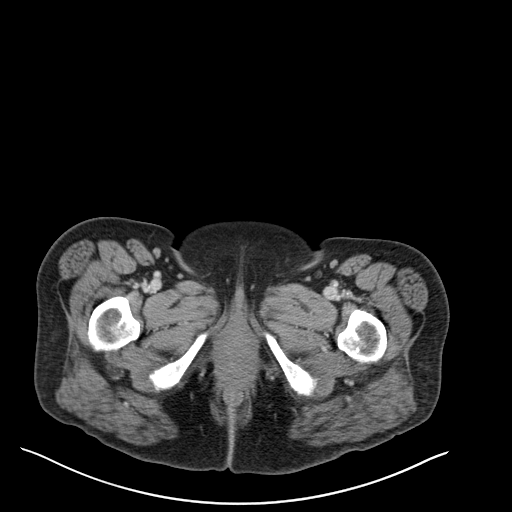
[im 7/88  bone]
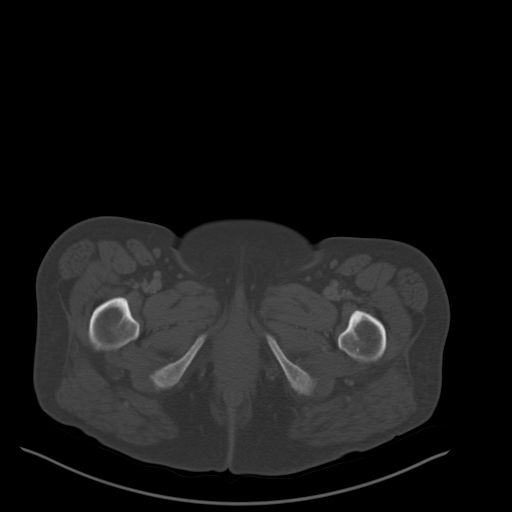
[im 13/88  soft-tissue]
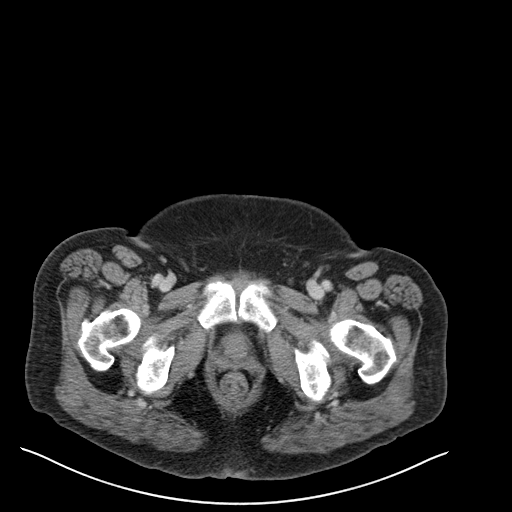
[im 19/88  soft-tissue]
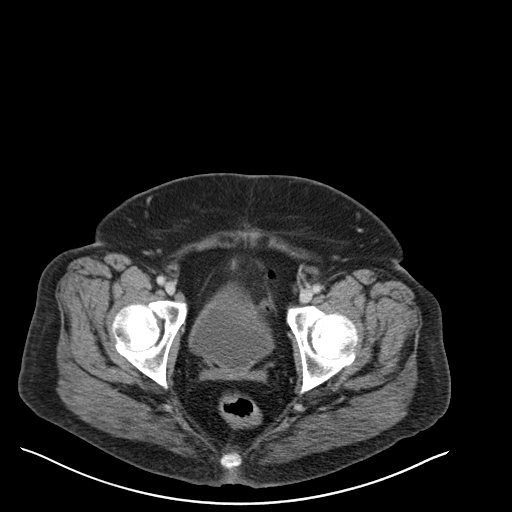
[im 25/88  soft-tissue]
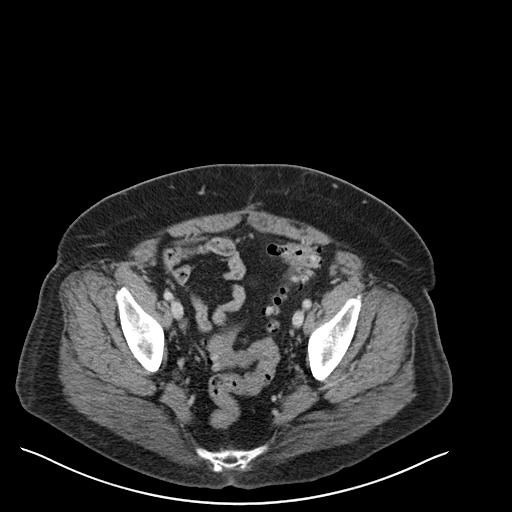
[im 32/88  soft-tissue]
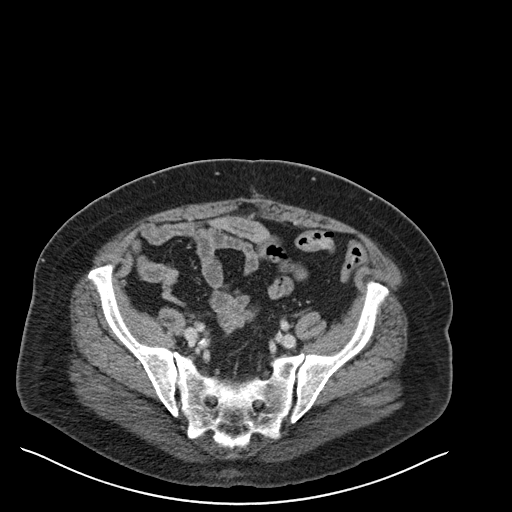
[im 38/88  soft-tissue]
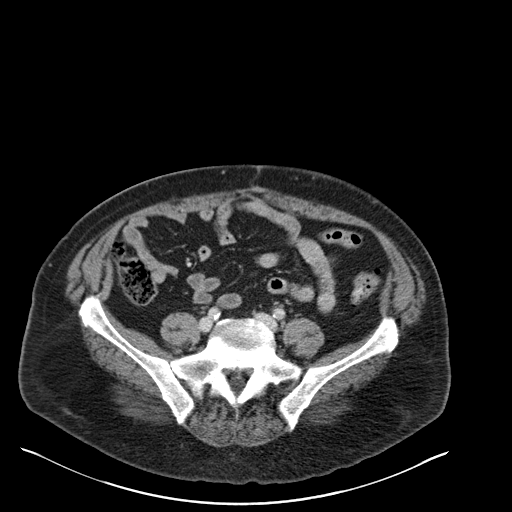
[im 44/88  soft-tissue]
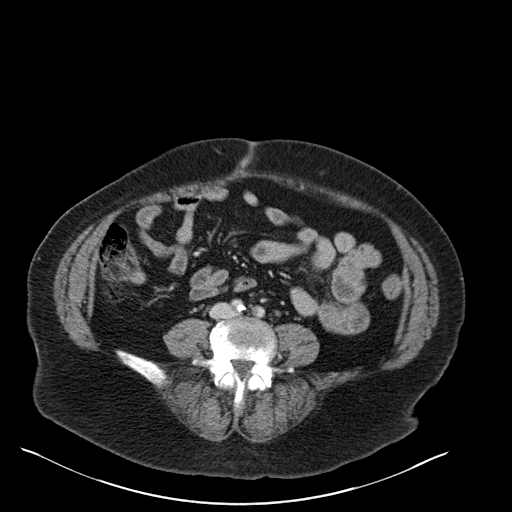
[im 50/88  soft-tissue]
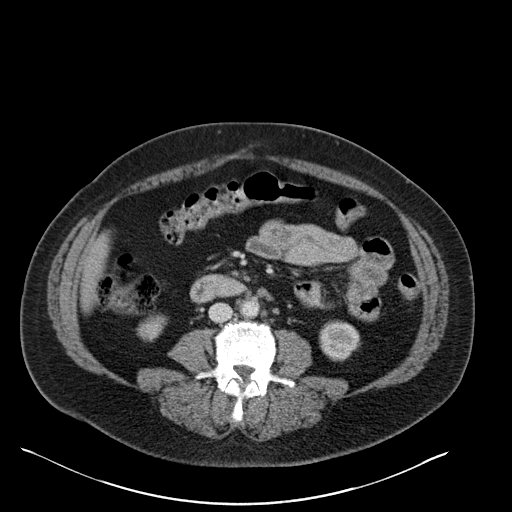
[im 56/88  soft-tissue]
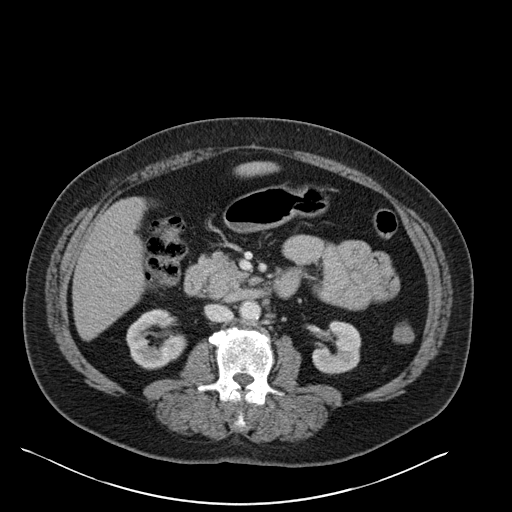
[im 56/88  bone]
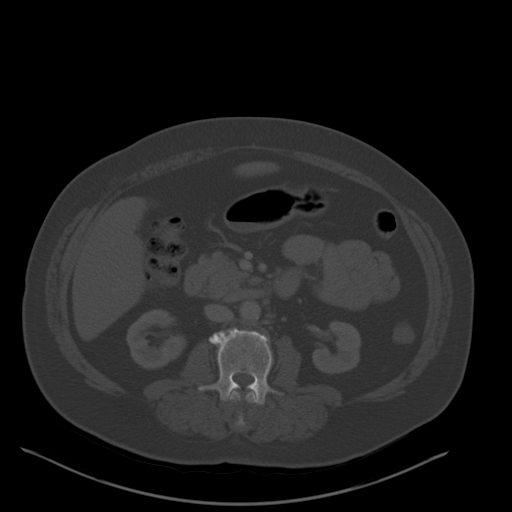
[im 63/88  soft-tissue]
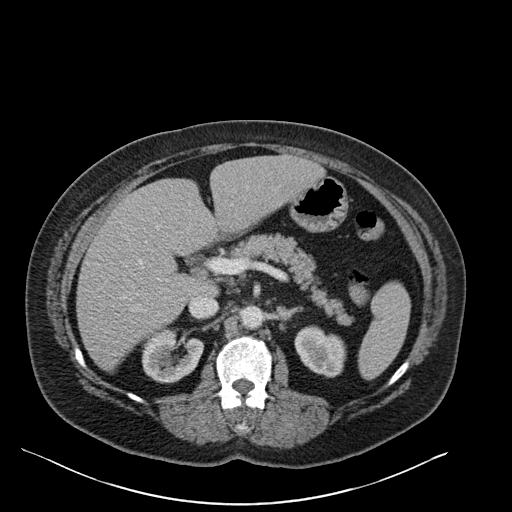
[im 69/88  soft-tissue]
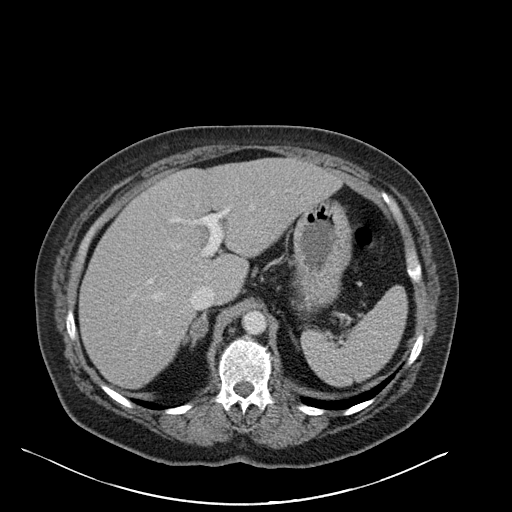
[im 75/88  soft-tissue]
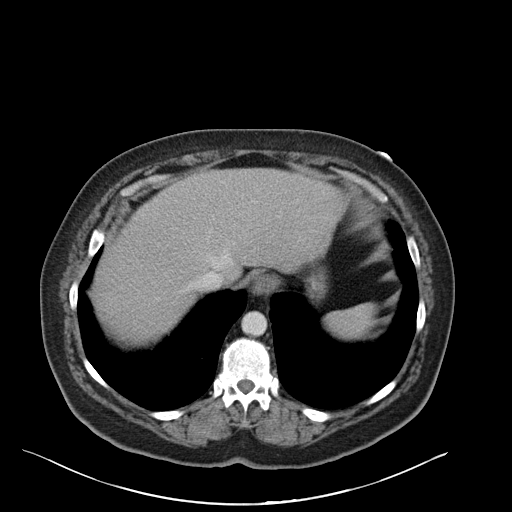
[im 81/88  soft-tissue]
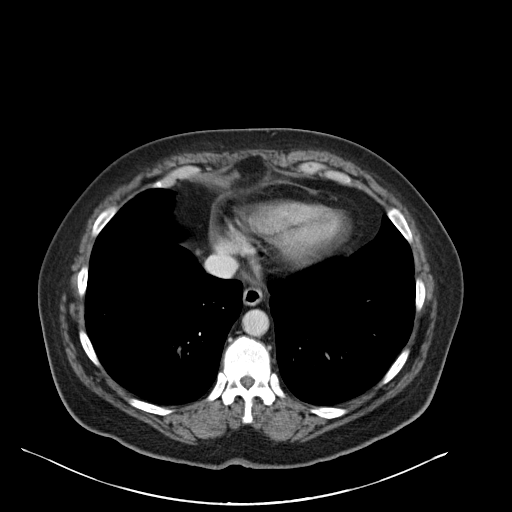

[Series 5: coronal st · coronal · 0.93mm/px · 3 of 148 slices shown]
[im 50/148  soft-tissue]
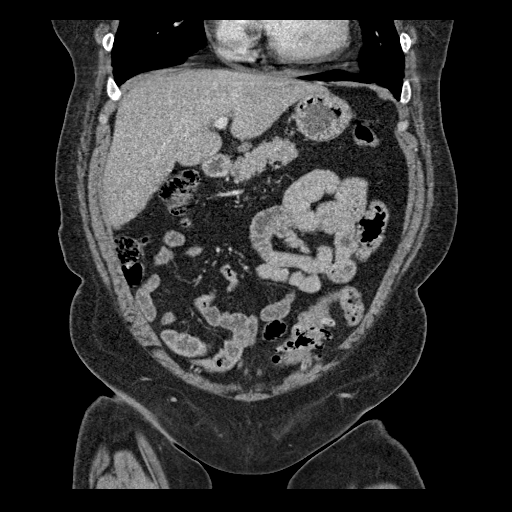
[im 66/148  soft-tissue]
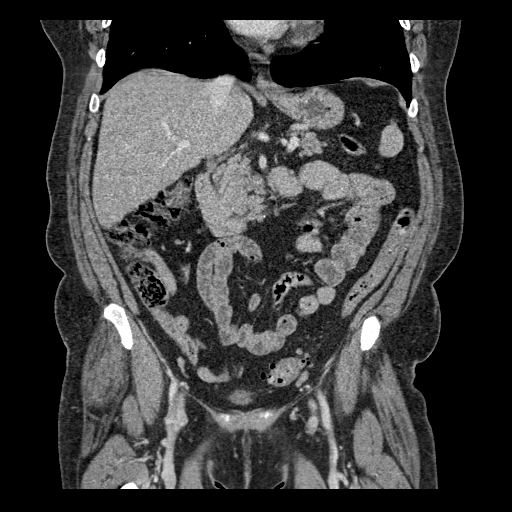
[im 82/148  soft-tissue]
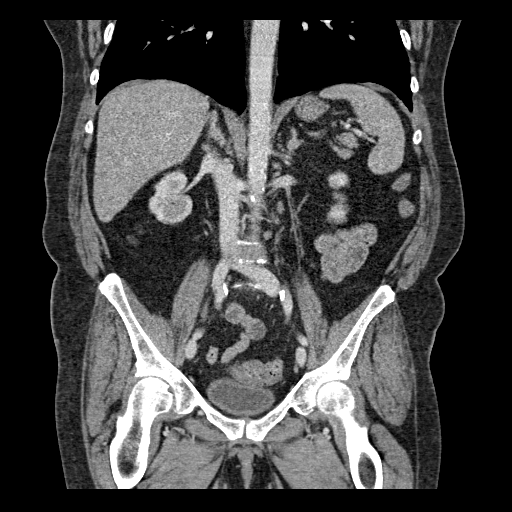

[16 of 46 positions shown; findings below may reference images not displayed]

FINDINGS: Lower chest: Mosaic attenuation, which may reflect small airways
disease.

Hepatobiliary: No focal liver abnormality is seen. Status post
cholecystectomy. No biliary dilatation.

Pancreas: Unremarkable.

Spleen: Unremarkable.

Adrenals/Urinary Tract: Adrenals are unremarkable. Kidneys are
unremarkable. Bladder is unremarkable for degree of distension.

Stomach/Bowel: Stomach is within normal limits. Bowel is normal in
caliber. Normal appendix. Distal colonic diverticulosis.

Vascular/Lymphatic: Aortic atherosclerosis. No enlarged lymph nodes.

Reproductive: Status post hysterectomy. No adnexal masses.

Other: No ascites.  No acute abnormality of the abdominal wall.

Musculoskeletal: Degenerative changes of the included spine. No
acute osseous abnormality.
IMPRESSION: No acute abnormality.

Colonic diverticulosis.

Aortic atherosclerosis.

## 2023-01-23 ENCOUNTER — Encounter (HOSPITAL_BASED_OUTPATIENT_CLINIC_OR_DEPARTMENT_OTHER): Payer: Medicaid Other | Admitting: Physical Therapy

## 2023-01-24 ENCOUNTER — Ambulatory Visit (HOSPITAL_COMMUNITY): Payer: Medicaid Other

## 2023-01-25 ENCOUNTER — Encounter (HOSPITAL_BASED_OUTPATIENT_CLINIC_OR_DEPARTMENT_OTHER): Payer: Self-pay | Admitting: Physical Therapy

## 2023-01-25 ENCOUNTER — Encounter (HOSPITAL_BASED_OUTPATIENT_CLINIC_OR_DEPARTMENT_OTHER): Payer: Self-pay

## 2023-01-25 ENCOUNTER — Ambulatory Visit (HOSPITAL_BASED_OUTPATIENT_CLINIC_OR_DEPARTMENT_OTHER): Payer: Medicaid Other | Admitting: Physical Therapy

## 2023-01-25 DIAGNOSIS — R262 Difficulty in walking, not elsewhere classified: Secondary | ICD-10-CM

## 2023-01-25 DIAGNOSIS — M6281 Muscle weakness (generalized): Secondary | ICD-10-CM

## 2023-01-25 DIAGNOSIS — G8929 Other chronic pain: Secondary | ICD-10-CM

## 2023-01-25 DIAGNOSIS — M25562 Pain in left knee: Secondary | ICD-10-CM | POA: Insufficient documentation

## 2023-01-25 NOTE — Therapy (Signed)
 OUTPATIENT PHYSICAL THERAPY  TREATMENT   Patient Name: Mary Campbell MRN: [id number] DOB:[birth year], 58 y.o., female Today's Date: 01/25/2023  END OF SESSION:  PT End of Session - 01/25/23 1630     Visit Number 8    Number of Visits 10    Date for PT Re-Evaluation 01/07/23    PT Start Time 1615    PT Stop Time 1630    PT Time Calculation (min) 15 min    Activity Tolerance Patient tolerated treatment well    Behavior During Therapy WFL for tasks assessed/performed                 Past Medical History:  Diagnosis Date   Chronic back pain    Diabetes mellitus    Hyperlipidemia    Hypertension    PAD (peripheral artery disease) (HCC)    a. PV angio 10/2018 - bilateral SFA disease s/p L intervention.   PAD (peripheral artery disease) (HCC)    Tobacco abuse    Past Surgical History:  Procedure Laterality Date   ABDOMINAL AORTOGRAM W/LOWER EXTREMITY Right 10/18/2018   Procedure: ABDOMINAL AORTOGRAM W/LOWER EXTREMITY;  Surgeon: Court Dorn PARAS, MD;  [facility];  Service: Cardiovascular;  Laterality: Right;   ABDOMINAL AORTOGRAM W/LOWER EXTREMITY N/A 04/02/2020   Procedure: ABDOMINAL AORTOGRAM W/LOWER EXTREMITY;  Surgeon: Court Dorn PARAS, MD;  Location: [facility];  Service: Cardiovascular;  Laterality: N/A;   ABDOMINAL AORTOGRAM W/LOWER EXTREMITY N/A 04/23/2020   Procedure: ABDOMINAL AORTOGRAM W/LOWER EXTREMITY;  Surgeon: Court Dorn PARAS, MD;  [facility]: [facility];  Service: Cardiovascular;  Laterality: N/A;   ABDOMINAL HYSTERECTOMY     CHOLECYSTECTOMY     PERIPHERAL VASCULAR ATHERECTOMY Left 10/18/2018   Procedure: PERIPHERAL VASCULAR ATHERECTOMY;  Surgeon: Court Dorn PARAS, MD;  [facility];  Service: Cardiovascular;  Laterality: Left;  SFA   PERIPHERAL VASCULAR ATHERECTOMY Right 11/22/2018   Procedure: PERIPHERAL VASCULAR ATHERECTOMY;  Surgeon: Court Dorn PARAS, MD;  Location: [facility] INVAS[facility] CV [facility];  Service:  Cardiovascular;  Laterality: Right;  SFA   PERIPHERAL VASCULAR ATHERECTOMY  04/23/2020   Procedure: PERIPHERAL VASCULAR ATHERECTOMY;  Surgeon: Court Dorn PARAS, MD;  [facility];  Service: Cardiovascular;;   PERIPHERAL VASCULAR BALLOON ANGIOPLASTY Right 11/22/2018   Procedure: PERIPHERAL VASCULAR BALLOON ANGIOPLASTY;  Surgeon: Court Dorn PARAS, MD;  [facility];  Service: Cardiovascular;  Laterality: Right;  SFA   PERIPHERAL VASCULAR BALLOON ANGIOPLASTY  04/23/2020   Procedure: PERIPHERAL VASCULAR BALLOON ANGIOPLASTY;  Surgeon: Court Dorn PARAS, MD;  [facility];  Service: Cardiovascular;;   PERIPHERAL VASCULAR INTERVENTION Left 04/02/2020   Procedure: PERIPHERAL VASCULAR INTERVENTION;  Surgeon: Court Dorn PARAS, MD;  [facility];  Service: Cardiovascular;  Laterality: Left;   Patient Active Problem List   Diagnosis Date Noted   Excessive daytime sleepiness 12/22/2022   DOE (dyspnea on exertion) 12/22/2022   Centrilobular emphysema (HCC) 12/22/2022   Claudication in peripheral vascular disease (HCC) 10/04/2018   Essential hypertension 10/04/2018   Hyperlipidemia 10/04/2018   Tobacco abuse 10/04/2018   Family history of heart disease 10/04/2018     REFERRING PROVIDER:  Arnaldo Juliene RAMAN, MD    REFERRING DIAG: [phone] (ICD-10-CM) - Pain in left knee   Rationale for Evaluation and Treatment: Rehabilitation  THERAPY DIAG:  Chronic pain of left knee  Difficulty in walking, not elsewhere classified  Muscle weakness (generalized)  ONSET DATE: 10/28/2020  (approximately)  SUBJECTIVE:                                                                                                                                                                                           SUBJECTIVE STATEMENT: Pt states that she has fallen twice since last session. She feels that her L foot catches and cannot lift up. Pt states the recent  cold makes her hurt more than usual.   BP this morning was 172/88. She has a stress test coming up 1/15 due to her recently high BP.  Recent imaging that shows small blockage in her heart. Pt has had her BP meds changed and gabapentin  was also increased. Pt has not been cleared for return to therapy.   FROM INITIAL EVALUATION Lt knee has been bothering me about 2 years, both are bad but Lt is worse. Wear and tear from working on concrete. Inflammatory arthritis. Dx with fibromyalgia. Both shoulders can't lift above shoulder height without pain. Both hands and feet hurt. Hips hurt. Bilateral groin pain. Lt lumbar spine/hip hurts to sit. Pain going down post Lt knee, sometimes past knee, use a cane because knee gives out sometimes. Achilles are tight. 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 Rt leg started occasionally will shake when I stand up- frequency depends on the pain.  Can only stand 15-20 min before pain. I can sit about 20-30 min.  A little dizzy sometimes but I think it's from the medications.  Has a recumb bike & treadmill at home.   PERTINENT HISTORY:  DM, PD, tobacco use  PAIN:  Are you having pain? Yes: NPRS scale: 8/10- Lt knee, 8/10 lower back  Pain location: see above  Pain description: sharp, burning Aggravating factors: constant Relieving factors: rest  PRECAUTIONS:  None  RED FLAGS: None   WEIGHT BEARING RESTRICTIONS:  No  FALLS:  Has patient fallen in last 6 months? No  LIVING ENVIRONMENT: Multi-story home, one hand rail on Rt side in ascending  OCCUPATION:  Not planning to return to work at this time.   PLOF:  Needs a shower chair or help to get out of tub, help with dressing- particularly bra and shoes  PATIENT GOALS:  Clean and cook again, go on trips again, back to being active  OBJECTIVE:  Note: Objective measures were completed at Evaluation unless otherwise  noted.  PATIENT SURVEYS:  ABC scale 170 total LEFS 10/80  12/21/22: LEFS 8/80 01/24/22 Lower Extremity Functional Score: 9 / 80 = 11.3 %  ASSESSMENT:  CLINICAL IMPRESSION: No treatment rendered during today's  session. LEFS taken. Edu was provided on safety with AD and AD was resized to aid in walking safety.   Through pt report and most recent BP archive in medical chart, pt is at an elevated HR that is above safe exercise parameters. Pt will see MD for stress test and clearance before return to therapy, especially with recent med changes. Pt to call and schedule upon MD clearance.    FROM EVAL Patient is a 58 y.o. F who was seen today for physical therapy evaluation and treatment for referral of left knee pain. Upon evaluation, noted that whole biomechanical chain is limited by significant weakness that does not support functional movement. Pt will begin in aquatics for strength building as current levels do not well support land- based treatment. Will benefit from skilled PT to address deficits and meet goals.     OBJECTIVE IMPAIRMENTS: Abnormal gait, decreased activity tolerance, decreased endurance, decreased mobility, difficulty walking, decreased strength, increased muscle spasms, impaired flexibility, impaired sensation, impaired UE functional use, improper body mechanics, postural dysfunction, and pain.   ACTIVITY LIMITATIONS: carrying, lifting, bending, sitting, standing, squatting, sleeping, stairs, transfers, bed mobility, bathing, toileting, dressing, reach over head, hygiene/grooming, locomotion level, and caring for others  PARTICIPATION LIMITATIONS: meal prep, cleaning, laundry, driving, shopping, community activity, and occupation  PERSONAL FACTORS:  see pmh  are also affecting patient's functional outcome.   REHAB POTENTIAL: Fair    CLINICAL DECISION MAKING: Evolving/moderate complexity  EVALUATION COMPLEXITY: Moderate   GOALS: Goals reviewed with patient?  Yes  SHORT TERM GOALS: Target date: 11/19/22  Begin a daily walking program Baseline:will educate and est as appropriate Goal status: In Progress 11/16/22  2.  Able to demo sit<>stand without trunk rotation & good use of glut set Baseline: began working at eval to reduce rotation to use Lt hand to get out of chair Goal status:  In progress 11/16/22  3.  Able to find a good working tolerance in aquatic exercise Baseline: to begin next visit  Goal status: In progress 11/16/22.  Met 12/21/22    LONG TERM GOALS: Target date: POC DATE  ABC scale improvements: stairs to 40%, bend over to pick up a slipper to 40% Baseline: both at 10% at eval Goal status: INITIAL  2.  Able to complete 5times sit to stand 2 min or less Baseline: unable to complete at eval Goal status: INITIAL  3.  LEFS to improve by MDC Baseline: MDC is 9 points Goal status: in process 12/21/22  4.  3 min walk distance to at least 200 ft Baseline: see obj Goal status: INITIAL  5.  Knee pain avg <=5/10 Baseline: resting at 6 today, up to 10/10 and limiting Goal status: in process 12/21/22    PLAN:  PT FREQUENCY: 1x/week  PT DURATION: 10 weeks  PLANNED INTERVENTIONS: 97146- PT Re-evaluation, 97110-Therapeutic exercises, 97530- Therapeutic activity, 97112- Neuromuscular re-education, 97535- Self Care, 02859- Manual therapy, [phone]- Gait training, [phone]- Aquatic Therapy, 97014- Electrical stimulation (unattended), Patient/Family education, Balance training, Stair training, Taping, Dry Needling, Joint mobilization, Spinal mobilization, Cryotherapy, and Moist heat.   Dale Call PT, DPT 01/25/23 4:38 PM   [facility] [address] [place], [place], [address] Phone: [phone]   Fax:  [phone]    Check all possible CPT codes: See Planned Interventions List for Planned CPT Codes    Check all conditions that are expected to impact treatment: {Conditions  expected to impact treatment:Diabetes mellitus   If treatment provided at initial  evaluation, no treatment charged due to lack of authorization.

## 2023-01-27 ENCOUNTER — Telehealth (HOSPITAL_COMMUNITY): Payer: Self-pay | Admitting: *Deleted

## 2023-01-27 NOTE — Telephone Encounter (Signed)
 Patient given detailed instructions per Myocardial Perfusion Study Information Sheet for the test on 02/01/2023 at 10:30. Patient notified to arrive 15 minutes early and that it is imperative to arrive on time for appointment to keep from having the test rescheduled.  If you need to cancel or reschedule your appointment, please call the office within 24 hours of your appointment. . Patient verbalized understanding.Mary Campbell

## 2023-02-01 ENCOUNTER — Ambulatory Visit (HOSPITAL_COMMUNITY): Payer: Medicaid Other | Attending: Cardiology

## 2023-02-01 DIAGNOSIS — R0789 Other chest pain: Secondary | ICD-10-CM | POA: Diagnosis present

## 2023-02-01 LAB — MYOCARDIAL PERFUSION IMAGING
Estimated workload: 1
Exercise duration (min): 1 min
Exercise duration (sec): 0 s
LV dias vol: 66 mL (ref 46–106)
LV sys vol: 27 mL
MPHR: 161 {beats}/min
Nuc Stress EF: 59 %
Peak HR: 105 {beats}/min
Percent HR: 65 %
Rest HR: 83 {beats}/min
Rest Nuclear Isotope Dose: 10.7 mCi
SDS: 2
SRS: 3
SSS: 5
ST Depression (mm): 0 mm
Stress Nuclear Isotope Dose: 31.5 mCi
TID: 1.01

## 2023-02-01 MED ORDER — TECHNETIUM TC 99M TETROFOSMIN IV KIT
31.3000 | PACK | Freq: Once | INTRAVENOUS | Status: AC | PRN
  Administered 2023-02-01: 31.5 via INTRAVENOUS

## 2023-02-01 MED ORDER — REGADENOSON 0.4 MG/5ML IV SOLN
0.4000 mg | Freq: Once | INTRAVENOUS | Status: AC
  Administered 2023-02-01: 0.4 mg via INTRAVENOUS

## 2023-02-01 MED ORDER — TECHNETIUM TC 99M TETROFOSMIN IV KIT
10.2000 | PACK | Freq: Once | INTRAVENOUS | Status: AC | PRN
  Administered 2023-02-01: 10.7 via INTRAVENOUS

## 2023-02-16 ENCOUNTER — Telehealth: Payer: Self-pay

## 2023-02-16 ENCOUNTER — Ambulatory Visit: Payer: Medicaid Other | Admitting: Nurse Practitioner

## 2023-02-16 ENCOUNTER — Encounter: Payer: Self-pay | Admitting: Nurse Practitioner

## 2023-03-09 ENCOUNTER — Ambulatory Visit: Payer: Medicaid Other

## 2023-03-20 ENCOUNTER — Encounter: Payer: Self-pay | Admitting: Physical Therapy

## 2023-03-20 ENCOUNTER — Other Ambulatory Visit: Payer: Self-pay

## 2023-03-20 ENCOUNTER — Ambulatory Visit: Payer: Medicaid Other | Attending: Sports Medicine | Admitting: Physical Therapy

## 2023-03-20 DIAGNOSIS — G8929 Other chronic pain: Secondary | ICD-10-CM | POA: Insufficient documentation

## 2023-03-20 DIAGNOSIS — M25562 Pain in left knee: Secondary | ICD-10-CM | POA: Insufficient documentation

## 2023-03-20 NOTE — Therapy (Signed)
 OUTPATIENT PHYSICAL THERAPY LOWER EXTREMITY EVALUATION   Patient Name: Mary Campbell MRN: [id number] DOB:1965-12-04, 58 y.o., female Today's Date: 03/20/2023  END OF SESSION:  PT End of Session - 03/20/23 1314     Visit Number 1    Number of Visits 1    PT Start Time 1315    PT Stop Time 1330    PT Time Calculation (min) 15 min             Past Medical History:  Diagnosis Date   Chronic back pain    Diabetes mellitus    Hyperlipidemia    Hypertension    PAD (peripheral artery disease) (HCC)    a. PV angio 10/2018 - bilateral SFA disease s/p L intervention.   PAD (peripheral artery disease) (HCC)    Tobacco abuse    Past Surgical History:  Procedure Laterality Date   ABDOMINAL AORTOGRAM W/LOWER EXTREMITY Right 10/18/2018   Procedure: ABDOMINAL AORTOGRAM W/LOWER EXTREMITY;  Surgeon: Runell Gess, MD;  [facility];  Service: Cardiovascular;  Laterality: Right;   ABDOMINAL AORTOGRAM W/LOWER EXTREMITY N/A 04/02/2020   Procedure: ABDOMINAL AORTOGRAM W/LOWER EXTREMITY;  Surgeon: Runell Gess, MD;  [facility];  Service: Cardiovascular;  Laterality: N/A;   ABDOMINAL AORTOGRAM W/LOWER EXTREMITY N/A 04/23/2020   Procedure: ABDOMINAL AORTOGRAM W/LOWER EXTREMITY;  Surgeon: Runell Gess, MD;  [facility];  Service: Cardiovascular;  Laterality: N/A;   ABDOMINAL HYSTERECTOMY     CHOLECYSTECTOMY     PERIPHERAL VASCULAR ATHERECTOMY Left 10/18/2018   Procedure: PERIPHERAL VASCULAR ATHERECTOMY;  Surgeon: Runell Gess, MD;  [facility];  Service: Cardiovascular;  Laterality: Left;  SFA   PERIPHERAL VASCULAR ATHERECTOMY Right 11/22/2018   Procedure: PERIPHERAL VASCULAR ATHERECTOMY;  Surgeon: Runell Gess, MD;  Location[facility] INVASIVE CV LAB;  Service: Cardiovascular;  Laterality: Right;  SFA   PERIPHERAL VASCULAR ATHERECTOMY  04/23/2020   Procedure: PERIPHERAL VASCULAR ATHERECTOMY;  Surgeon: Runell Gess, MD;  Location: [facility] INVASIVE CV [facility];  Service: Cardiovascular;;   PERIPHERAL VASCULAR BALLOON ANGIOPLASTY Right 11/22/2018   Procedure: PERIPHERAL VASCULAR BALLOON ANGIOPLASTY;  Surgeon: Runell Gess, MD;  [facility];  Service: Cardiovascular;  Laterality: Right;  SFA   PERIPHERAL VASCULAR BALLOON ANGIOPLASTY  04/23/2020   Procedure: PERIPHERAL VASCULAR BALLOON ANGIOPLASTY;  Surgeon: Runell Gess, MD;  [facility];  Service: Cardiovascular;;   PERIPHERAL VASCULAR INTERVENTION Left 04/02/2020   Procedure: PERIPHERAL VASCULAR INTERVENTION;  Surgeon: Runell Gess, MD;  [facility];  Service: Cardiovascular;  Laterality: Left;   Patient Active Problem List   Diagnosis Date Noted   Excessive daytime sleepiness 12/22/2022   DOE (dyspnea on exertion) 12/22/2022   Centrilobular emphysema (HCC) 12/22/2022   Claudication in peripheral vascular disease (HCC) 10/04/2018   Essential hypertension 10/04/2018   Hyperlipidemia 10/04/2018   Tobacco abuse 10/04/2018   Family history of heart disease 10/04/2018    PCP: Ralene Ok, MD  REFERRING PROVIDER: Rodolph Bong S,MD  REFERRING DIAG: Pain in left knee [M25.562]  THERAPY DIAG:  Chronic pain of left knee  Rationale for Evaluation and Treatment: Rehabilitation   ONSET DATE:   SUBJECTIVE:   SUBJECTIVE STATEMENT: Eval statement 03/20/2023: Pt stated that she was supposed to be seen for an FCE for insurance purposes. Pt reported wanting to prioritize this over conventional PT as she has tried PT before and had to stop due to BP  irregularities.  PERTINENT HISTORY: Centrilobular emphysema, HTN, Claudication in PVD  PRECAUTIONS: None  RED FLAGS: None    CLINICAL IMPRESSION: Pt arrived to therapy for evaluation of L knee pain. Pt reported that she was supposed to be seen for an FCE and showed messages between her and her physician. Pt was educated that this clinic does not  perform FCEs and will have to get a referral specifically for an FCE from her PCP, and should call clinics to ensure FCE performance prior to scheduling. Pt stated wanting to prioritize that and did not want to complete an evaluation for L knee pain as indicated on current referral, at present time.    Sheliah Plane, PT, DPT 03/20/2023, 2:08 PM

## 2023-03-24 ENCOUNTER — Telehealth: Payer: Self-pay

## 2023-03-27 NOTE — Telephone Encounter (Signed)
 talked w/ pt about HST , pt states she can't afford. reached out to [facility]  Outgoing call

## 2023-03-27 NOTE — Telephone Encounter (Signed)
   LM: asked to please call back ( FYI:  we are unable to do medical request due to her not having a home sleep test, she would have to talk to her PCP).  Outgoing call

## 2023-04-03 ENCOUNTER — Telehealth: Payer: Self-pay | Admitting: *Deleted

## 2023-04-03 NOTE — Telephone Encounter (Signed)
   Pre-operative Risk Assessment    Patient Name: Mary Campbell  DOB: 1965/06/05 MRN: [id number]   Date of last office visit: 12/19/22 Mary Fabian, FNP Date of next office visit: NONE  Request for Surgical Clearance    Procedure:  Dental Extraction - Amount of Teeth to be Pulled:  SURGICAL EXTRACTIONS Remaining upper teeth, EXCISION AND Bx OF UPPER JAW CYST ; WILL REACH OUT TO THE DENTAL OFFICE TO CONFIRM HOW MANY TEETH FOR EXTRACTION  Date of Surgery:  Clearance TBD                                Surgeon:  DR. Tito Dine Surgeon's Group or Practice Name:  [facility] Phone number:  [phone] Fax number:  [phone]   Type of Clearance Requested:   - Medical  - Pharmacy:  Hold Aspirin and Clopidogrel (Plavix)     Type of Anesthesia:  General    Additional requests/questions:    Elpidio Anis   04/03/2023, 11:47 AM

## 2023-04-04 NOTE — Telephone Encounter (Signed)
 Patient needs to know what to do about her sleep study. Her insurance will not cover it and is wondering if there is an alternative. Please call and advise at [phone]

## 2023-04-05 ENCOUNTER — Encounter: Payer: Self-pay | Admitting: Physician Assistant

## 2023-04-05 ENCOUNTER — Other Ambulatory Visit: Payer: Self-pay | Admitting: Physician Assistant

## 2023-04-05 DIAGNOSIS — R202 Paresthesia of skin: Secondary | ICD-10-CM

## 2023-04-06 NOTE — Telephone Encounter (Signed)
 I tried to call the DDS to confirm how many teeth are being extracted. I called the # that came on the form [phone]. This seems to go to a concierge. I told her that I was needing to s/w the DDS office in regard to the pt, need to confirm how many teeth are being extracted. She tells me that there is no direct # the DDS program. I was placed on hold for over 6+ minutes as she said she is going to s/w her supervisor to see if they can help her figure this out. I had to hang up. I am going to fax notes to the requesting office to please call our office with HOW MANY TEETH BEING EXTRACTED.

## 2023-04-07 NOTE — Telephone Encounter (Signed)
 It looks like Mary Campbell has authorized this.  Thayer Ohm will you please call to schedule?

## 2023-04-07 NOTE — Telephone Encounter (Signed)
 Pt has been scheduled.

## 2023-04-10 LAB — COLOGUARD: COLOGUARD: POSITIVE — AB

## 2023-04-10 LAB — EXTERNAL GENERIC LAB PROCEDURE: COLOGUARD: POSITIVE — AB

## 2023-04-11 NOTE — Telephone Encounter (Signed)
 I will re-fax these notes fro dental office to please reply with the information being requested by our office to for the preop clearance, see notes.

## 2023-04-13 ENCOUNTER — Ambulatory Visit

## 2023-04-13 DIAGNOSIS — G4719 Other hypersomnia: Secondary | ICD-10-CM

## 2023-04-13 DIAGNOSIS — R0683 Snoring: Secondary | ICD-10-CM

## 2023-04-17 ENCOUNTER — Telehealth: Payer: Self-pay | Admitting: Nurse Practitioner

## 2023-04-17 NOTE — Telephone Encounter (Signed)
 Form received. In [employer]

## 2023-04-17 NOTE — Telephone Encounter (Signed)
 Received Waiver of Premium claim from [employer] via mail, placed in [employer]. Patient states to bill $29 fee to [facility]

## 2023-04-20 DIAGNOSIS — G4733 Obstructive sleep apnea (adult) (pediatric): Secondary | ICD-10-CM | POA: Diagnosis not present

## 2023-04-25 ENCOUNTER — Encounter: Payer: Self-pay | Admitting: Gastroenterology

## 2023-04-25 ENCOUNTER — Other Ambulatory Visit: Payer: Self-pay | Admitting: Physician Assistant

## 2023-04-25 ENCOUNTER — Telehealth: Payer: Self-pay

## 2023-04-25 DIAGNOSIS — Z1231 Encounter for screening mammogram for malignant neoplasm of breast: Secondary | ICD-10-CM

## 2023-04-25 NOTE — Telephone Encounter (Signed)
 Copied from CRM [id number]. Topic: Clinical - Lab/Test Results >> Apr 25, 2023 10:59 AM Mary Campbell wrote: Reason for CRM: Patient has questions regarding their sleep study results and when they need to follow up and questions around the CPAP machine and they also have some additional questions for the doctor.  I called and spoke to pt. Pt states she was not sure if she needed a f/u appointment regarding her sleep study results. I informed pt that she can have an appointment made with [name]. NP regarding her HST. Pt also needs to have a 1 hour PFT done prior to her appointment date. Routing to front office to have the 1 hour PFT and f/u appointment scheduled with [name].

## 2023-04-28 ENCOUNTER — Encounter: Payer: Self-pay | Admitting: Physician Assistant

## 2023-05-10 ENCOUNTER — Encounter: Payer: Self-pay | Admitting: Cardiovascular Disease

## 2023-05-10 ENCOUNTER — Ambulatory Visit: Attending: Cardiovascular Disease | Admitting: Cardiovascular Disease

## 2023-05-10 VITALS — BP 140/54 | HR 85 | Ht 64.0 in | Wt 167.0 lb

## 2023-05-10 DIAGNOSIS — I739 Peripheral vascular disease, unspecified: Secondary | ICD-10-CM

## 2023-05-10 NOTE — Patient Instructions (Signed)
 Medication Instructions:  Your physician recommends that you continue on your current medications as directed. Please refer to the Current Medication list given to you today.  *If you need a refill on your cardiac medications before your next appointment, please call your pharmacy*  Testing/Procedures: Your physician has requested that you have a lower extremity arterial duplex. During this test, ultrasound is used to evaluate arterial blood flow in the legs. Allow one hour for this exam. There are no restrictions or special instructions.  This will take place at [address] [facility]. 4th Floor.   Please note: We ask at that you not bring children with you during ultrasound (echo/ vascular) testing. Due to room size and safety concerns, children are not allowed in the ultrasound rooms during exams. Our front office staff cannot provide observation of children in our lobby area while testing is being conducted. An adult accompanying a patient to their appointment will only be allowed in the ultrasound room at the discretion of the ultrasound technician under special circumstances. We apologize for any inconvenience.   Your physician has requested that you have an ankle brachial index (ABI). During this test an ultrasound and blood pressure cuff are used to evaluate the arteries that supply the arms and legs with blood. Allow thirty minutes for this exam. There are no restrictions or special instructions. This will take place at [address] [facility]. 4th Floor.   Please note: We ask at that you not bring children with you during ultrasound (echo/ vascular) testing. Due to room size and safety concerns, children are not allowed in the ultrasound rooms during exams. Our front office staff cannot provide observation of children in our lobby area while testing is being conducted. An adult accompanying a patient to their appointment will only be allowed in the ultrasound room at the discretion of the ultrasound  technician under special circumstances. We apologize for any inconvenience.   Follow-Up: At [facility], you and your health needs are our priority.  As part of our continuing mission to provide you with exceptional heart care, our providers are all part of one team.  This team includes your primary Cardiologist (physician) and Advanced Practice Providers or APPs (Physician Assistants and Nurse Practitioners) who all work together to provide you with the care you need, when you need it.  Your next appointment:   12 month(s)  Provider:   Lauro Portal, MD   We recommend signing up for the patient portal called "MyChart".  Sign up information is provided on this After Visit Summary.  MyChart is used to connect with patients for Virtual Visits (Telemedicine).  Patients are able to view lab/test results, encounter notes, upcoming appointments, etc.  Non-urgent messages can be sent to your provider as well.   To learn more about what you can do with MyChart, go to [email or web address].   Other Instructions       1st Floor: - Lobby - Registration  - Pharmacy  - Lab - Cafe  2nd Floor: - PV Lab - Diagnostic Testing (echo, CT, nuclear med)  3rd Floor: - Vacant  4th Floor: - TCTS (cardiothoracic surgery) - AFib Clinic - Structural Heart Clinic - Vascular Surgery  - Vascular Ultrasound  5th Floor: - HeartCare Cardiology (general and EP) - Clinical Pharmacy for coumadin, hypertension, lipid, weight-loss medications, and med management appointments    Valet parking services will be available as well.

## 2023-05-10 NOTE — Progress Notes (Signed)
 05/10/2023 Mary Campbell   Oct 19, 1965  [id number]  Primary Physician Edda Goo, MD Primary Cardiologist: Avanell Leigh MD Bennye Bravo, [place]  HPI:  Mary Campbell is a 58 y.o.   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  She was referred by Dr. Rolm Clos , her primary cardiologist, for peripheral vascular valuation because of lifestyle limiting claudication.  I last saw her in the office 11/05/2021 . Her cardiac risk factors are notable for 40-50-pack-year tobacco abuse currently trying to stop on Chantix , treated hypertension, diabetes and hyperlipidemia as well as family history with a father who had multiple stents myocardial infarction's.  She is never had a heart attack or stroke.  She currently denies chest pain or shortness of breath although recently she did have chest pain and was evaluated by Dr. Deanna ExposeAndree Kayser  She had a negative Myoview  and a normal 2D echo.  She is complained of claudication over the last year which is fairly symmetric.  This is not bothering her during her workday but when walking longer distances, out shopping, she does have to stop because of cramping in her calves.  Doppler studies performed 09/26/2018 revealed ABIs of about 0.7 bilaterally with an occluded above-the-knee popliteal on the right and high-frequency signal in the proximal left SFA.   I performed peripheral angiography on her 10/18/2018 revealing short segment CTO mid to distal right SFA with three-vessel runoff and 95% / 60% segmental proximal left SFA stenosis with three-vessel runoff.  Performed directional atherectomy followed by drug-coated balloon angioplasty of the proximal left SFA with an excellent result.  Her claudication resolved and her Dopplers improved with a left ABI 0.84.  She wishes to proceed with right SFA intervention.   I performed right SFA directional arthrectomy followed by drug-coated balloon angioplasty on 11/22/2018 with excellent  angiographic result.  Her Dopplers improved and her claudication has resolved.  Of note, she did stop smoking on 11/20/2018.  She no longer has pain in her legs at night and is able to ambulate without limitation.   Doppler studies performed 02/26/2020 that suggest restenosis of both SFAs.  She relates to me that she is moderately symptomatic and wishes to proceed with percutaneous intervention.  She is on Chantix  and successfully stopped smoking on 03/30/2020.   I performed peripheral angiography on her 04/02/2020 revealing high-grade segmental mid left SFA stenosis with three-vessel runoff.  I performed directional atherectomy followed by drug-coated balloon angioplasty with an excellent angiographic and clinical result.  Claudication on that side resolved and her foot is now warm.     I performed right SFA intervention on her/7/22 with directional atherectomy followed by [facility] using distal protection.  She went home the same day after I performed Perclose of her left common femoral artery.  Her follow-up lower extremity arterial Doppler studies performed on 04/30/2020 revealed ABIs in the mid 0.8 range with patent SFAs bilaterally.  Her claudication has resolved.  She did stop smoking on 03/30/2020.  Unfortunately however her hemoglobin A1c has remained elevated.  Since I saw her in the office a year and a half ago she continues to do well.  She still smokes 1 pack/week.  She denies chest pain, shortness of breath or claudication.  Her Doppler studies performed 04/22/22 revealed an ABI of 0.88 on the right and 0.84 in the left with high-frequency signals in both SFAs.     Current Meds  Medication Sig   acetaminophen  (TYLENOL ) 650 MG  CR tablet Take 1,300 mg by mouth every 8 (eight) hours as needed for pain.   albuterol  (VENTOLIN  HFA) 108 (90 Base) MCG/ACT inhaler Inhale 2 puffs into the lungs every 6 (six) hours as needed for wheezing or shortness of breath.   amLODipine-benazepril (LOTREL) 5-20 MG capsule  Take 1 capsule by mouth daily.   aspirin  EC 81 MG tablet Take 81 mg by mouth daily.   clobetasol cream (TEMOVATE) 0.05 % Apply 1 Application topically 2 (two) times daily.   clopidogrel  (PLAVIX ) 75 MG tablet Take 1 tablet (75 mg total) by mouth daily.   diphenhydrAMINE  (BENADRYL ) 25 mg capsule Take 25 mg by mouth every 6 (six) hours as needed for allergies.   DULoxetine (CYMBALTA) 60 MG capsule Take 60 mg by mouth daily.   fluticasone (FLONASE) 50 MCG/ACT nasal spray Place 1 spray into both nostrils 2 (two) times daily.   gabapentin (NEURONTIN) 300 MG capsule Take 300 mg by mouth 2 (two) times daily.   ibuprofen (ADVIL) 200 MG tablet Take 400 mg by mouth every 6 (six) hours as needed for moderate pain (pain score 4-6) or headache.   insulin  glargine (LANTUS ) 100 UNIT/ML injection Inject 48 Units into the skin at bedtime.   JANUMET XR 50-1000 MG TB24 Take 1 tablet by mouth in the morning and at bedtime.   rosuvastatin  (CRESTOR ) 40 MG tablet Take 1 tablet (40 mg total) by mouth daily.   Current Facility-Administered Medications for the 05/10/23 encounter (Office Visit) with Avanell Leigh, MD  Medication   sodium chloride  flush (NS) 0.9 % injection 3 mL   sodium chloride  flush (NS) 0.9 % injection 3 mL     Allergies  Allergen Reactions   Codeine Hives   Lisinopril Cough   Sulfonamide Derivatives Hives    Social History   Socioeconomic History   Marital status: Legally Separated    Spouse name: Not on file   Number of children: Not on file   Years of education: Not on file   Highest education level: Not on file  Occupational History   Not on file  Tobacco Use   Smoking status: Former    Current packs/day: 0.00    Types: Cigarettes    Quit date: 08/11/2022    Years since quitting: 0.7   Smokeless tobacco: Never  Vaping Use   Vaping status: Never Used  Substance and Sexual Activity   Alcohol use: No   Drug use: No   Sexual activity: Yes    Birth control/protection:  Surgical  Other Topics Concern   Not on file  Social History Narrative   Not on file   Social Drivers of Health   Financial Resource Strain: Not on file  Food Insecurity: Not on file  Transportation Needs: Not on file  Physical Activity: Not on file  Stress: Not on file  Social Connections: Not on file  Intimate Partner Violence: Not on file     Review of Systems: General: negative for chills, fever, night sweats or weight changes.  Cardiovascular: negative for chest pain, dyspnea on exertion, edema, orthopnea, palpitations, paroxysmal nocturnal dyspnea or shortness of breath Dermatological: negative for rash Respiratory: negative for cough or wheezing Urologic: negative for hematuria Abdominal: negative for nausea, vomiting, diarrhea, bright red blood per rectum, melena, or hematemesis Neurologic: negative for visual changes, syncope, or dizziness All other systems reviewed and are otherwise negative except as noted above.    Blood pressure (!) 140/54, pulse 85, height 5\' 4"  (1.626 m), weight  167 lb (75.8 kg), SpO2 95%.  General appearance: alert and no distress Neck: no adenopathy, no carotid bruit, no JVD, supple, symmetrical, trachea midline, and thyroid not enlarged, symmetric, no tenderness/mass/nodules Lungs: clear to auscultation bilaterally Heart: regular rate and rhythm, S1, S2 normal, no murmur, click, rub or gallop Extremities: extremities normal, atraumatic, no cyanosis or edema Pulses: Diminished pedal pulses Skin: Skin color, texture, turgor normal. No rashes or lesions Neurologic: Grossly normal  EKG not performed today       ASSESSMENT AND PLAN:   Claudication in peripheral vascular disease (HCC) History of PAD status post bilateral SFA intervention twice for lifestyle-limiting claudication with early restenosis.  Her last procedures were in 2022.  She is currently claudication free with Dopplers performed 04/22/22 revealing a right ABI of 0.88, left of  0.84 with high-frequency signals in both SFAs.  Given her lack of symptoms we will continue to monitor her noninvasively.  I will see her back on an annual basis.     Avanell Leigh MD FACP,FACC,FAHA, [facility] 05/10/2023 2:29 PM

## 2023-05-10 NOTE — Assessment & Plan Note (Signed)
 History of PAD status post bilateral SFA intervention twice for lifestyle-limiting claudication with early restenosis.  Her last procedures were in 2022.  She is currently claudication free with Dopplers performed 04/22/22 revealing a right ABI of 0.88, left of 0.84 with high-frequency signals in both SFAs.  Given her lack of symptoms we will continue to monitor her noninvasively.  I will see her back on an annual basis.

## 2023-05-12 ENCOUNTER — Ambulatory Visit
Admission: RE | Admit: 2023-05-12 | Discharge: 2023-05-12 | Disposition: A | Discharge status: Home / Self Care | Source: Ambulatory Visit | Attending: Physician Assistant | Admitting: Physician Assistant

## 2023-05-12 DIAGNOSIS — Z1231 Encounter for screening mammogram for malignant neoplasm of breast: Secondary | ICD-10-CM

## 2023-05-12 NOTE — Telephone Encounter (Signed)
 FMLA forms have not been received. Placing a copy in Cobb's box to be signed.

## 2023-05-16 NOTE — Telephone Encounter (Signed)
 Spoke with patient regarding FMLA paperwork . Patient stated to disregard paperwork . Patient is getting her long term disability . Patient's voice was understanding.Nothing else further needed.

## 2023-05-17 ENCOUNTER — Other Ambulatory Visit: Payer: Self-pay | Admitting: Physician Assistant

## 2023-05-17 DIAGNOSIS — R928 Other abnormal and inconclusive findings on diagnostic imaging of breast: Secondary | ICD-10-CM

## 2023-05-18 ENCOUNTER — Encounter: Payer: Self-pay | Admitting: Podiatry

## 2023-05-18 ENCOUNTER — Ambulatory Visit (HOSPITAL_BASED_OUTPATIENT_CLINIC_OR_DEPARTMENT_OTHER): Admitting: Internal Medicine

## 2023-05-18 ENCOUNTER — Encounter: Payer: Self-pay | Admitting: Cardiovascular Disease

## 2023-05-18 DIAGNOSIS — J432 Centrilobular emphysema: Secondary | ICD-10-CM | POA: Diagnosis not present

## 2023-05-18 DIAGNOSIS — R0609 Other forms of dyspnea: Secondary | ICD-10-CM

## 2023-05-18 LAB — PULMONARY FUNCTION TEST
DL/VA % pred: 81 %
DL/VA: 3.4 ml/min/mmHg/L
DLCO cor % pred: 62 %
DLCO cor: 12.92 ml/min/mmHg
DLCO unc % pred: 62 %
DLCO unc: 12.92 ml/min/mmHg
FEF 25-75 Post: 2.07 L/s
FEF 25-75 Pre: 2.03 L/s
FEF2575-%Change-Post: 1 %
FEF2575-%Pred-Post: 86 %
FEF2575-%Pred-Pre: 85 %
FEV1-%Change-Post: 2 %
FEV1-%Pred-Post: 73 %
FEV1-%Pred-Pre: 71 %
FEV1-Post: 1.91 L
FEV1-Pre: 1.86 L
FEV1FVC-%Change-Post: 2 %
FEV1FVC-%Pred-Pre: 105 %
FEV6-%Change-Post: 0 %
FEV6-%Pred-Post: 69 %
FEV6-%Pred-Pre: 69 %
FEV6-Post: 2.27 L
FEV6-Pre: 2.27 L
FEV6FVC-%Pred-Post: 103 %
FEV6FVC-%Pred-Pre: 103 %
FVC-%Change-Post: 0 %
FVC-%Pred-Post: 67 %
FVC-%Pred-Pre: 67 %
FVC-Post: 2.27 L
FVC-Pre: 2.27 L
Post FEV1/FVC ratio: 84 %
Post FEV6/FVC ratio: 100 %
Pre FEV1/FVC ratio: 82 %
Pre FEV6/FVC Ratio: 100 %
RV % pred: 118 %
RV: 2.38 L
TLC % pred: 90 %
TLC: 4.65 L

## 2023-05-18 NOTE — Progress Notes (Signed)
 Full PFT Performed Today

## 2023-05-18 NOTE — Patient Instructions (Signed)
 Full PFT Performed Today

## 2023-05-19 NOTE — Telephone Encounter (Signed)
   Patient Name: Mary Campbell  DOB: 1965-12-04 MRN: [id number]  Primary Cardiologist: Oneil Bigness, MD  Chart reviewed as part of pre-operative protocol coverage. Given past medical history and time since last visit, based on ACC/AHA guidelines, Mary Campbell is at acceptable risk for the planned procedure without further cardiovascular testing.   She may hold Plavix  for 5 days prior to procedure.  She may hold aspirin  5-7 days prior to procedure.  Please resume Plavix  and aspirin  as soon as possible postprocedure, at the discretion of the surgeon.   SBE prophylaxis is not required for the patient.   I will route this recommendation to the requesting party via Epic fax function and remove from pre-op pool.  Please call with questions.  Ava Boatman, NP 05/19/2023, 3:25 PM   .[email or web address]

## 2023-05-19 NOTE — Telephone Encounter (Signed)
 I will send to preop for review as pt has let us  know how many teeth are being extracted. Pt recently seen by Dr. Katheryne Pane.  Patient Message Open 05/18/2023 [facility] at [facility] A Dept of [employer]. [facility] Cheral Cordoba, Frederico Jan, MD Cardiology   All Conversations: dental surgery (Newest Message First)Carson Stuffle to [facility] Triage (supporting Avanell Leigh, MD)      05/18/23  8:51 PM I am needing dental surgery and they will be removing 13 teeth and removing cyst. I need you to send in the form letting them know if I can be off of the blood thinner and send them a copy of my last visit notes. Thank you, This encounter is not signed. The conversation may still be ongoing.

## 2023-05-30 ENCOUNTER — Ambulatory Visit
Admission: RE | Admit: 2023-05-30 | Discharge: 2023-05-30 | Disposition: A | Discharge status: Home / Self Care | Source: Ambulatory Visit | Attending: Physician Assistant | Admitting: Physician Assistant

## 2023-05-30 DIAGNOSIS — R928 Other abnormal and inconclusive findings on diagnostic imaging of breast: Secondary | ICD-10-CM

## 2023-05-31 ENCOUNTER — Ambulatory Visit
Admission: RE | Admit: 2023-05-31 | Discharge: 2023-05-31 | Disposition: A | Discharge status: Home / Self Care | Source: Ambulatory Visit | Attending: Physician Assistant | Admitting: Physician Assistant

## 2023-05-31 ENCOUNTER — Other Ambulatory Visit: Payer: Self-pay | Admitting: Physician Assistant

## 2023-05-31 DIAGNOSIS — N6320 Unspecified lump in the left breast, unspecified quadrant: Secondary | ICD-10-CM

## 2023-05-31 HISTORY — PX: BREAST BIOPSY: SHX20

## 2023-06-01 LAB — SURGICAL PATHOLOGY

## 2023-06-02 ENCOUNTER — Telehealth: Payer: Self-pay | Admitting: *Deleted

## 2023-06-02 NOTE — Telephone Encounter (Signed)
 Spoke to patient to confirm upcoming afternoon [facility] clinic appointment on 5/21, paperwork will be sent via email  Gave location and time, also informed patient that the surgeon's office would be calling as well to get information from them similar to the packet that they will be receiving so make sure to do both.  Reminded patient that all providers will be coming to the clinic to see them HERE and if they had any questions to not hesitate to reach back out to myself or their navigators.

## 2023-06-05 ENCOUNTER — Ambulatory Visit (HOSPITAL_COMMUNITY)

## 2023-06-05 ENCOUNTER — Encounter (HOSPITAL_COMMUNITY): Payer: Self-pay

## 2023-06-05 ENCOUNTER — Encounter: Payer: Self-pay | Admitting: *Deleted

## 2023-06-05 DIAGNOSIS — Z171 Estrogen receptor negative status [ER-]: Secondary | ICD-10-CM | POA: Insufficient documentation

## 2023-06-06 NOTE — Progress Notes (Signed)
 Radiation Oncology         [phone] ________________________________  Initial Outpatient Consultation  Name: Mary Campbell MRN: [id number]  Date: 06/07/2023  DOB: 05/28/1965  CC:Mary Goo, MD  Mary Chandler, MD   REFERRING PHYSICIAN: Lillette Reid III, MD  DIAGNOSIS:    ICD-10-CM   1. Malignant neoplasm of upper-outer quadrant of left breast in female, estrogen receptor negative (HCC)  C50.412    Z17.1        Cancer Staging  Malignant neoplasm of upper-outer quadrant of left breast in female, estrogen receptor negative (HCC) Staging form: Breast, AJCC 8th Edition - Clinical: Stage IA (cT1c, cN0, cM0, G2, ER-, PR-, HER2+) - Signed by Cameron Cea, MD on 06/07/2023 Stage prefix: Initial diagnosis Histologic grading system: 3 grade system    CHIEF COMPLAINT: Here to discuss management of left breast cancer  HISTORY OF PRESENT ILLNESS::Mary Campbell is a 58 y.o. female who presented with a left breast abnormality on the following imaging: bilateral screening mammogram on the date of 05/12/23. No symptoms, if any, were reported at that time. A left breast diagnostic mammogram and left breast ultrasound was subsequently performed on 05/30/23 which further revealed a highly suspicious mass in the 12 o'clock retroareolar left breast measuring 1.2 cm in the greatest extent. No evidence of left axillary lymphadenopathy was demonstrated.   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  ER status: 0% negative; PR status 0% negative; Proliferation marker Ki67 at 15%;  Her2 status positive; Grade 2. No lymph nodes were examined.   She reports that she follows with a cardiologist.  She is a smoker  PREVIOUS RADIATION THERAPY: No  PAST MEDICAL HISTORY:  has a past medical history of Chronic back pain, Diabetes mellitus, Hyperlipidemia, Hypertension, PAD (peripheral artery  disease) (HCC), PAD (peripheral artery disease) (HCC), and Tobacco abuse.    PAST SURGICAL HISTORY: Past Surgical History:  Procedure Laterality Date   ABDOMINAL AORTOGRAM W/LOWER EXTREMITY Right 10/18/2018   Procedure: ABDOMINAL AORTOGRAM W/LOWER EXTREMITY;  Surgeon: Avanell Leigh, MD;  Location: [facility];  Service: Cardiovascular;  Laterality: Right;   ABDOMINAL AORTOGRAM W/LOWER EXTREMITY N/A 04/02/2020   Procedure: ABDOMINAL AORTOGRAM W/LOWER EXTREMITY;  Surgeon: Avanell Leigh, MD;  Location: [facility];  Service: Cardiovascular;  Laterality: N/A;   ABDOMINAL AORTOGRAM W/LOWER EXTREMITY N/A 04/23/2020   Procedure: ABDOMINAL AORTOGRAM W/LOWER EXTREMITY;  Surgeon: Avanell Leigh, MD;  Location: [facility];  Service: Cardiovascular;  Laterality: N/A;   ABDOMINAL HYSTERECTOMY     BREAST BIOPSY Left 05/31/2023   US  LT BREAST BX W LOC DEV 1ST LESION IMG BX SPEC US  GUIDE 05/31/2023 [facility] MAMMOGRAPHY   CHOLECYSTECTOMY     PERIPHERAL VASCULAR ATHERECTOMY Left 10/18/2018   Procedure: PERIPHERAL VASCULAR ATHERECTOMY;  Surgeon: Avanell Leigh, MD;  Location: [facility];  Service: Cardiovascular;  Laterality: Left;  SFA   PERIPHERAL VASCULAR ATHERECTOMY Right 11/22/2018   Procedure: PERIPHERAL VASCULAR ATHERECTOMY;  Surgeon: Avanell Leigh, MD;  Location: [facility] CV [facility];  Service: Cardiovascular;  Laterality: Right;  SFA   PERIPHERAL VASCULAR ATHERECTOMY  04/23/2020   Procedure: PERIPHERAL VASCULAR ATHERECTOMY;  Surgeon: Avanell Leigh, MD;  Location: [facility] INVASIVE CV LAB;  Service: Cardiovascular;;   PERIPHERAL VASCULAR BALLOON ANGIOPLASTY Right 11/22/2018   Procedure: PERIPHERAL VASCULAR BALLOON ANGIOPLASTY;  Surgeon: Avanell Leigh, MD;  Location: [facility];  Service: Cardiovascular;  Laterality: Right;  SFA   PERIPHERAL VASCULAR BALLOON ANGIOPLASTY  04/23/2020   Procedure: PERIPHERAL VASCULAR BALLOON ANGIOPLASTY;  Surgeon: Avanell Leigh,  MD;  Location: [facility];  Service: Cardiovascular;;   PERIPHERAL VASCULAR INTERVENTION Left 04/02/2020   Procedure: PERIPHERAL VASCULAR INTERVENTION;  Surgeon: Avanell Leigh, MD;  Location: [facility];  Service: Cardiovascular;  Laterality: Left;    FAMILY HISTORY: family history includes Diabetes in her mother; Heart attack in her father; Heart disease in her father; Hyperlipidemia in her father and mother; Hypertension in her father and mother.  SOCIAL HISTORY:  reports that she quit smoking about 9 months ago. Her smoking use included cigarettes. She has never used smokeless tobacco. She reports that she does not drink alcohol and does not use drugs.  ALLERGIES: Codeine, Lisinopril, and Sulfonamide derivatives  MEDICATIONS:  Current Outpatient Medications  Medication Sig Dispense Refill   acetaminophen  (TYLENOL ) 650 MG CR tablet Take 1,300 mg by mouth every 8 (eight) hours as needed for pain.     albuterol  (VENTOLIN  HFA) 108 (90 Base) MCG/ACT inhaler Inhale 2 puffs into the lungs every 6 (six) hours as needed for wheezing or shortness of breath. 8 g 2   amLODipine-benazepril (LOTREL) 5-20 MG capsule Take 1 capsule by mouth daily.     aspirin  EC 81 MG tablet Take 81 mg by mouth daily.     clobetasol cream (TEMOVATE) 0.05 % Apply 1 Application topically 2 (two) times daily.     clopidogrel  (PLAVIX ) 75 MG tablet Take 1 tablet (75 mg total) by mouth daily. 90 tablet 1   diphenhydrAMINE  (BENADRYL ) 25 mg capsule Take 25 mg by mouth every 6 (six) hours as needed for allergies.     DULoxetine (CYMBALTA) 60 MG capsule Take 60 mg by mouth daily.     fluticasone (FLONASE) 50 MCG/ACT nasal spray Place 1 spray into both nostrils 2 (two) times daily.     gabapentin (NEURONTIN) 300 MG capsule Take 300 mg by mouth 2 (two) times daily.     ibuprofen (ADVIL) 200 MG tablet Take 400 mg by mouth every 6 (six) hours as needed for moderate pain (pain score 4-6) or headache.     insulin   glargine (LANTUS ) 100 UNIT/ML injection Inject 48 Units into the skin at bedtime.     JANUMET XR 50-1000 MG TB24 Take 1 tablet by mouth in the morning and at bedtime.     rosuvastatin  (CRESTOR ) 40 MG tablet Take 1 tablet (40 mg total) by mouth daily. 90 tablet 3   Current Facility-Administered Medications  Medication Dose Route Frequency Provider Last Rate Last Admin   sodium chloride  flush (NS) 0.9 % injection 3 mL  3 mL Intravenous Q12H Avanell Leigh, MD       sodium chloride  flush (NS) 0.9 % injection 3 mL  3 mL Intravenous Q12H Avanell Leigh, MD        REVIEW OF SYSTEMS: As above in HPI.   PHYSICAL EXAM:  vitals were not taken for this visit.   General: Alert and oriented, in no acute distress HEENT: Head is normocephalic. Extraocular movements are intact.  Heart: Regular in rate and rhythm -positive for heart murmur  Chest: Clear to auscultation bilaterally, with no rhonchi, wheezes, or rales. Abdomen: Soft, nontender, nondistended, with no rigidity or guarding. Extremities: No cyanosis or edema. Skin: No concerning lesions. Musculoskeletal: symmetric strength and muscle tone throughout. Neurologic: Cranial nerves II  through XII are grossly intact. No obvious focalities. Speech is fluent. Coordination is intact. Psychiatric: Judgment and insight are intact. Affect is appropriate. Breasts: There is a bruise in the 3 o'clock position of the left breast, near her areola. No palpable masses appreciated in the breasts or axillae .    ECOG = 1  0 - Asymptomatic (Fully active, able to carry on all predisease activities without restriction)  1 - Symptomatic but completely ambulatory (Restricted in physically strenuous activity but ambulatory and able to carry out work of a light or sedentary nature. For example, light housework, office work)  2 - Symptomatic, <50% in bed during the day (Ambulatory and capable of all self care but unable to carry out any work activities. Up and  about more than 50% of waking hours)  3 - Symptomatic, >50% in bed, but not bedbound (Capable of only limited self-care, confined to bed or chair 50% or more of waking hours)  4 - Bedbound (Completely disabled. Cannot carry on any self-care. Totally confined to bed or chair)  5 - Death   Aurea Blossom MM, Creech RH, Tormey DC, et al. [phone]). "Toxicity and response criteria of the [facility]". Am. Hillard Lowes. Oncol. 5 (6): 649-55   LABORATORY DATA:   CBC    Component Value Date/Time   WBC 10.5 06/07/2023 1224   WBC 12.6 (H) 11/23/2020 1125   RBC 4.36 06/07/2023 1224   HGB 12.7 06/07/2023 1224   HGB 12.0 04/15/2020 1537   HCT 37.7 06/07/2023 1224   HCT 36.5 04/15/2020 1537   PLT 293 06/07/2023 1224   PLT 323 04/15/2020 1537   MCV 86.5 06/07/2023 1224   MCV 87 04/15/2020 1537   MCH 29.1 06/07/2023 1224   MCHC 33.7 06/07/2023 1224   RDW 15.6 (H) 06/07/2023 1224   RDW 14.4 04/15/2020 1537   LYMPHSABS 1.7 06/07/2023 1224   LYMPHSABS 2.5 10/25/2018 1552   MONOABS 0.5 06/07/2023 1224   EOSABS 0.1 06/07/2023 1224   EOSABS 0.2 10/25/2018 1552   BASOSABS 0.1 06/07/2023 1224   BASOSABS 0.1 10/25/2018 1552    CMP     Component Value Date/Time   NA 138 06/07/2023 1224   NA 139 04/15/2020 1537   K 4.1 06/07/2023 1224   CL 103 06/07/2023 1224   CO2 30 06/07/2023 1224   GLUCOSE 186 (H) 06/07/2023 1224   BUN 9 06/07/2023 1224   BUN 9 04/15/2020 1537   CREATININE 0.96 06/07/2023 1224   CALCIUM  9.1 06/07/2023 1224   PROT 6.9 06/07/2023 1224   PROT 7.0 03/11/2020 1005   ALBUMIN 3.8 06/07/2023 1224   ALBUMIN 4.3 03/11/2020 1005   AST 10 (L) 06/07/2023 1224   ALT 8 06/07/2023 1224   ALKPHOS 159 (H) 06/07/2023 1224   BILITOT 0.3 06/07/2023 1224   EGFR 76 04/15/2020 1537   GFRNONAA >60 06/07/2023 1224      RADIOGRAPHY: US  LT BREAST BX W LOC DEV 1ST LESION IMG BX SPEC US  GUIDE Addendum Date: 06/06/2023 ADDENDUM REPORT: 06/06/2023 08:25 ADDENDUM: Pathology  revealed: 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 This was found to be concordant by Dr. Marvene Slipper. Pathology results were discussed with the patient by telephone. The patient reported doing well after the biopsy with tenderness at the site. Post biopsy instructions and care were reviewed and questions were answered. The patient was encouraged to call [facility] for any additional  concerns. My direct phone number was provided. The patient was referred to [facility] on Jun 07, 2023. Pathology results reported by Kraig Peru, RN on 06/01/2023. Electronically Signed   By: Rinda Cheers M.D.   On: 06/06/2023 08:25   Result Date: 06/06/2023 CLINICAL DATA:  58 year old with a screening detected new highly suspicious 1.2 cm mass with associated distortion and faint calcifications in the upper LEFT breast at middle depth, 12 o'clock 1 cm from the nipple. The patient is currently anticoagulated on Plavix  due to peripheral arterial disease. EXAM: ULTRASOUND GUIDED LEFT BREAST CORE NEEDLE BIOPSY COMPARISON:  Previous exam(s). PROCEDURE: I met with the patient and we discussed the procedure of ultrasound-guided biopsy, including benefits and alternatives. We discussed the high likelihood of a successful procedure. We discussed the risks of the procedure, including infection, bleeding, tissue injury, clip migration, and inadequate sampling. Informed written consent was given. The usual time-out protocol was performed immediately prior to the procedure. Lesion quadrant: Upper breast, 12 o'clock location. Using sterile technique with chlorhexidine as skin antisepsis, 1% lidocaine  and 1% lidocaine  with epinephrine as local anesthetic, under direct ultrasound visualization, a 12 gauge Bard Marquee core needle device placed through an 11 gauge introducer  needle was used to perform biopsy of the mass in the upper breast using a lateral approach. At the conclusion of the procedure, a ribbon shaped tissue marker clip was deployed into the biopsy cavity. The patient tolerated the procedure well without apparent immediate complications. Follow up 2 view mammogram was performed in order to confirm clip placement and was dictated separately. IMPRESSION: Ultrasound guided core needle biopsy of a highly suspicious 1.2 cm mass in the upper LEFT breast at middle depth. Electronically Signed: By: Rinda Cheers M.D. On: 05/31/2023 14:45   MM CLIP PLACEMENT LEFT Result Date: 05/31/2023 CLINICAL DATA:  Confirmation of clip placement after ultrasound-guided core needle biopsy of a highly suspicious mass in the upper LEFT breast at middle depth. EXAM: 2D and 3D DIAGNOSTIC LEFT MAMMOGRAM POST ULTRASOUND BIOPSY COMPARISON:  Previous exam(s). ACR Breast Density Category b: There are scattered areas of fibroglandular density. FINDINGS: 2D and 3D full field CC and mediolateral mammographic images were obtained following ultrasound guided biopsy of a highly suspicious mass in the upper LEFT breast at middle depth. The ribbon shaped tissue marking clip is appropriately positioned at the posteromedial margin of the biopsied mass in the upper breast at middle depth. Expected post biopsy hemorrhage is present without evidence of hematoma. IMPRESSION: Appropriate positioning of the ribbon shaped tissue marking clip at the posteromedial margin of the biopsied mass in the upper LEFT breast at middle depth. Final Assessment: Post Procedure Mammograms for Marker Placement Electronically Signed   By: Rinda Cheers M.D.   On: 05/31/2023 14:44   MM 3D DIAGNOSTIC MAMMOGRAM UNILATERAL LEFT BREAST Result Date: 05/30/2023 CLINICAL DATA:  58 year old female presenting as a recall from screening for possible left breast mass and left breast asymmetry. EXAM: DIGITAL DIAGNOSTIC UNILATERAL LEFT  MAMMOGRAM WITH TOMOSYNTHESIS AND CAD; ULTRASOUND LEFT BREAST LIMITED TECHNIQUE: Left digital diagnostic mammography and breast tomosynthesis was performed. The images were evaluated with computer-aided detection. ; Targeted ultrasound examination of the left breast was performed. COMPARISON:  Previous exam(s). ACR Breast Density Category b: There are scattered areas of fibroglandular density. FINDINGS: Mammogram: Left breast: Spot compression tomosynthesis and full true lateral and full MLO tomosynthesis views of the left breast performed demonstrating persistence of an irregular  mass with spiculation in the retroareolar left breast measuring approximately 1.1 cm. The second asymmetry seen on screening mammogram in the inferior breast resolves on the additional imaging. Ultrasound: Targeted ultrasound performed in the left breast at 12 o'clock retroareolar demonstrating an irregular hypoechoic mass with indistinct margins measuring 0.9 x 1.0 x 1.2 cm. This corresponds to the mammographic finding. Targeted ultrasound of the left axilla demonstrates normal lymph nodes. IMPRESSION: Highly suspicious mass in the left breast at 12 o'clock retroareolar measuring 1.2 cm. RECOMMENDATION: Ultrasound-guided core needle biopsy x1 of the left breast. I have discussed the findings and recommendations with the patient. If applicable, a reminder letter will be sent to the patient regarding the next appointment. BI-RADS CATEGORY  5: Highly suggestive of malignancy. Electronically Signed   By: Allena Ito M.D.   On: 05/30/2023 15:51   US  LIMITED ULTRASOUND INCLUDING AXILLA LEFT BREAST  Result Date: 05/30/2023 CLINICAL DATA:  58 year old female presenting as a recall from screening for possible left breast mass and left breast asymmetry. EXAM: DIGITAL DIAGNOSTIC UNILATERAL LEFT MAMMOGRAM WITH TOMOSYNTHESIS AND CAD; ULTRASOUND LEFT BREAST LIMITED TECHNIQUE: Left digital diagnostic mammography and breast tomosynthesis was  performed. The images were evaluated with computer-aided detection. ; Targeted ultrasound examination of the left breast was performed. COMPARISON:  Previous exam(s). ACR Breast Density Category b: There are scattered areas of fibroglandular density. FINDINGS: Mammogram: Left breast: Spot compression tomosynthesis and full true lateral and full MLO tomosynthesis views of the left breast performed demonstrating persistence of an irregular mass with spiculation in the retroareolar left breast measuring approximately 1.1 cm. The second asymmetry seen on screening mammogram in the inferior breast resolves on the additional imaging. Ultrasound: Targeted ultrasound performed in the left breast at 12 o'clock retroareolar demonstrating an irregular hypoechoic mass with indistinct margins measuring 0.9 x 1.0 x 1.2 cm. This corresponds to the mammographic finding. Targeted ultrasound of the left axilla demonstrates normal lymph nodes. IMPRESSION: Highly suspicious mass in the left breast at 12 o'clock retroareolar measuring 1.2 cm. RECOMMENDATION: Ultrasound-guided core needle biopsy x1 of the left breast. I have discussed the findings and recommendations with the patient. If applicable, a reminder letter will be sent to the patient regarding the next appointment. BI-RADS CATEGORY  5: Highly suggestive of malignancy. Electronically Signed   By: Allena Ito M.D.   On: 05/30/2023 15:51   MM 3D SCREENING MAMMOGRAM BILATERAL BREAST Result Date: 05/16/2023 CLINICAL DATA:  Screening. EXAM: DIGITAL SCREENING BILATERAL MAMMOGRAM WITH TOMOSYNTHESIS AND CAD TECHNIQUE: Bilateral screening digital craniocaudal and mediolateral oblique mammograms were obtained. Bilateral screening digital breast tomosynthesis was performed. The images were evaluated with computer-aided detection. COMPARISON:  Previous exam(s). ACR Breast Density Category b: There are scattered areas of fibroglandular density. FINDINGS: In the left breast, a  possible mass and separate possible asymmetry warrants further evaluation. In the right breast, no findings suspicious for malignancy. IMPRESSION: Further evaluation is suggested for a possible mass and possible asymmetry in the left breast. RECOMMENDATION: Diagnostic mammogram and possibly ultrasound of the left breast. (Code:[id number]) The patient will be contacted regarding the findings, and additional imaging will be scheduled. BI-RADS CATEGORY  0: Incomplete: Need additional imaging evaluation. Electronically Signed   By: Allena Ito M.D.   On: 05/16/2023 15:27      IMPRESSION/PLAN:  Cancer Staging  Malignant neoplasm of upper-outer quadrant of left breast in female, estrogen receptor negative (HCC) Staging form: Breast, AJCC 8th Edition - Clinical: Stage IA (cT1c, cN0, cM0, G2, ER-, PR-, HER2+) - Signed  by Cameron Cea, MD on 06/07/2023 Stage prefix: Initial diagnosis Histologic grading system: 3 grade system  She anticipates breast conserving surgery followed by chemotherapy  It was a pleasure meeting the patient today. We discussed the risks, benefits, and side effects of adjuvant radiotherapy. I recommend radiotherapy to the left breast to reduce her risk of locoregional recurrence by 2/3.  We discussed that radiation would take approximately 4 weeks to complete and that I would give the patient a few weeks to heal following surgery before starting treatment planning.   Chemotherapy will precede radiotherapy. We spoke about acute effects including skin irritation and fatigue as well as much less common late effects including internal organ injury or irritation. We spoke about the latest technology that is used to minimize the risk of late effects for patients undergoing radiotherapy to the breast or chest wall. No guarantees of treatment were given. The patient is enthusiastic about proceeding with treatment. I look forward to participating in the patient's care.  I will await her referral  back to me for postoperative follow-up and eventual CT simulation/treatment planning.  I asked the patient today about tobacco use. The patient uses tobacco.  I advised the patient to quit. Services were offered by me today including outpatient counseling and pharmacotherapy. I assessed for the willingness to attempt to quit and provided encouragement and demonstrated willingness to make referrals and/or prescriptions to help the patient attempt to quit. The patient has follow-up with the oncologic team to touch base on their tobacco use and /or cessation efforts.  Over 3 minutes were spent on this issue.  She plans to quit tomorrow.  She has prescriptions for Wellbutrin and nicotine patches from another physician  Heart murmur was appreciated on physical exam today.  The patient has no known history of heart murmur. She and her daughter know to discuss this with her cardiologist as well so that it can be managed /monitored as needed.   On date of service, in total, I spent 60 minutes on this encounter. Patient was seen in person.   __________________________________________   Colie Dawes, MD  This document serves as a record of services personally performed by Colie Dawes, MD. It was created on her behalf by Aleta Anda, a trained medical scribe. The creation of this record is based on the scribe's personal observations and the provider's statements to them. This document has been checked and approved by the attending provider.

## 2023-06-07 ENCOUNTER — Inpatient Hospital Stay: Attending: Hematology and Oncology | Admitting: Hematology and Oncology

## 2023-06-07 ENCOUNTER — Encounter: Payer: Self-pay | Admitting: Radiation Oncology

## 2023-06-07 ENCOUNTER — Inpatient Hospital Stay

## 2023-06-07 ENCOUNTER — Other Ambulatory Visit: Payer: Self-pay

## 2023-06-07 ENCOUNTER — Encounter: Payer: Self-pay | Admitting: General Practice

## 2023-06-07 ENCOUNTER — Encounter: Payer: Self-pay | Admitting: Physical Therapy

## 2023-06-07 ENCOUNTER — Encounter: Payer: Self-pay | Admitting: *Deleted

## 2023-06-07 ENCOUNTER — Telehealth (HOSPITAL_BASED_OUTPATIENT_CLINIC_OR_DEPARTMENT_OTHER): Payer: Self-pay | Admitting: *Deleted

## 2023-06-07 ENCOUNTER — Ambulatory Visit
Admission: RE | Admit: 2023-06-07 | Discharge: 2023-06-07 | Disposition: A | Discharge status: Home / Self Care | Source: Ambulatory Visit | Attending: Radiation Oncology | Admitting: Radiation Oncology

## 2023-06-07 ENCOUNTER — Inpatient Hospital Stay (HOSPITAL_BASED_OUTPATIENT_CLINIC_OR_DEPARTMENT_OTHER): Admitting: Genetic Counselor

## 2023-06-07 ENCOUNTER — Ambulatory Visit: Payer: Self-pay | Admitting: General Surgery

## 2023-06-07 ENCOUNTER — Ambulatory Visit: Attending: General Surgery | Admitting: Physical Therapy

## 2023-06-07 VITALS — BP 150/96 | HR 91 | Temp 97.6°F | Resp 18 | Ht 64.5 in | Wt 172.0 lb

## 2023-06-07 DIAGNOSIS — Z171 Estrogen receptor negative status [ER-]: Secondary | ICD-10-CM | POA: Insufficient documentation

## 2023-06-07 DIAGNOSIS — C50412 Malignant neoplasm of upper-outer quadrant of left female breast: Secondary | ICD-10-CM

## 2023-06-07 DIAGNOSIS — R293 Abnormal posture: Secondary | ICD-10-CM | POA: Diagnosis present

## 2023-06-07 DIAGNOSIS — Z79899 Other long term (current) drug therapy: Secondary | ICD-10-CM | POA: Insufficient documentation

## 2023-06-07 LAB — CBC WITH DIFFERENTIAL (CANCER CENTER ONLY)
Abs Immature Granulocytes: 0.03 10*3/uL (ref 0.00–0.07)
Basophils Absolute: 0.1 10*3/uL (ref 0.0–0.1)
Basophils Relative: 1 %
Eosinophils Absolute: 0.1 10*3/uL (ref 0.0–0.5)
Eosinophils Relative: 1 %
HCT: 37.7 % (ref 36.0–46.0)
Hemoglobin: 12.7 g/dL (ref 12.0–15.0)
Immature Granulocytes: 0 %
Lymphocytes Relative: 16 %
Lymphs Abs: 1.7 10*3/uL (ref 0.7–4.0)
MCH: 29.1 pg (ref 26.0–34.0)
MCHC: 33.7 g/dL (ref 30.0–36.0)
MCV: 86.5 fL (ref 80.0–100.0)
Monocytes Absolute: 0.5 10*3/uL (ref 0.1–1.0)
Monocytes Relative: 5 %
Neutro Abs: 8.1 10*3/uL — ABNORMAL HIGH (ref 1.7–7.7)
Neutrophils Relative %: 77 %
Platelet Count: 293 10*3/uL (ref 150–400)
RBC: 4.36 MIL/uL (ref 3.87–5.11)
RDW: 15.6 % — ABNORMAL HIGH (ref 11.5–15.5)
WBC Count: 10.5 10*3/uL (ref 4.0–10.5)
nRBC: 0 % (ref 0.0–0.2)

## 2023-06-07 LAB — CMP (CANCER CENTER ONLY)
ALT: 8 U/L (ref 0–44)
AST: 10 U/L — ABNORMAL LOW (ref 15–41)
Albumin: 3.8 g/dL (ref 3.5–5.0)
Alkaline Phosphatase: 159 U/L — ABNORMAL HIGH (ref 38–126)
Anion gap: 5 (ref 5–15)
BUN: 9 mg/dL (ref 6–20)
CO2: 30 mmol/L (ref 22–32)
Calcium: 9.1 mg/dL (ref 8.9–10.3)
Chloride: 103 mmol/L (ref 98–111)
Creatinine: 0.96 mg/dL (ref 0.44–1.00)
GFR, Estimated: >60 mL/min (ref >60)
Glucose, Bld: 186 mg/dL — ABNORMAL HIGH (ref 70–99)
Potassium: 4.1 mmol/L (ref 3.5–5.1)
Sodium: 138 mmol/L (ref 135–145)
Total Bilirubin: 0.3 mg/dL (ref 0.0–1.2)
Total Protein: 6.9 g/dL (ref 6.5–8.1)

## 2023-06-07 LAB — GENETIC SCREENING ORDER

## 2023-06-07 NOTE — Telephone Encounter (Signed)
   Patient Name: Mary Campbell  DOB: 1965/05/29 MRN: [id number]  Primary Cardiologist: Oneil Bigness, MD  Chart reviewed as part of pre-operative protocol coverage. Given past medical history and time since last visit, based on ACC/AHA guidelines, Mary Campbell is at acceptable risk for the planned procedure without further cardiovascular testing.   The patient was advised that if she develops new symptoms prior to surgery to contact our office to arrange for a follow-up visit, and she verbalized understanding.  Per office protocol, if patient is without any new symptoms or concerns at the time of their virtual visit, he/she may hold Plavix  for 5 days prior to procedure. Please resume Plavix  as soon as possible postprocedure, at the discretion of the surgeon.    I will route this recommendation to the requesting party via Epic fax function and remove from pre-op pool.  Please call with questions.  Friddie Jetty, NP 06/07/2023, 4:45 PM

## 2023-06-07 NOTE — Therapy (Signed)
 OUTPATIENT PHYSICAL THERAPY BREAST CANCER BASELINE EVALUATION   Patient Name: Mary Campbell MRN: [id number] DOB:20-Oct-1965, 58 y.o., female Today's Date: 06/07/2023  END OF SESSION:  PT End of Session - 06/07/23 1629     Visit Number 1    Number of Visits 2    Date for PT Re-Evaluation 08/02/23    PT Start Time 1319    PT Stop Time 1331   Also saw pt from [phone] for a total of 26 min   PT Time Calculation (min) 12 min    Activity Tolerance Patient tolerated treatment well    Behavior During Therapy WFL for tasks assessed/performed             Past Medical History:  Diagnosis Date   Chronic back pain    Diabetes mellitus    Hyperlipidemia    Hypertension    PAD (peripheral artery disease) (HCC)    a. PV angio 10/2018 - bilateral SFA disease s/p L intervention.   PAD (peripheral artery disease) (HCC)    Tobacco abuse    Past Surgical History:  Procedure Laterality Date   ABDOMINAL AORTOGRAM W/LOWER EXTREMITY Right 10/18/2018   Procedure: ABDOMINAL AORTOGRAM W/LOWER EXTREMITY;  Surgeon: Avanell Leigh, MD;  Location: [facility];  Service: Cardiovascular;  Laterality: Right;   ABDOMINAL AORTOGRAM W/LOWER EXTREMITY N/A 04/02/2020   Procedure: ABDOMINAL AORTOGRAM W/LOWER EXTREMITY;  Surgeon: Avanell Leigh, MD;  Location: [facility];  Service: Cardiovascular;  Laterality: N/A;   ABDOMINAL AORTOGRAM W/LOWER EXTREMITY N/A 04/23/2020   Procedure: ABDOMINAL AORTOGRAM W/LOWER EXTREMITY;  Surgeon: Avanell Leigh, MD;  Location: [facility];  Service: Cardiovascular;  Laterality: N/A;   ABDOMINAL HYSTERECTOMY     BREAST BIOPSY Left 05/31/2023   US  LT BREAST BX W LOC DEV 1ST LESION IMG BX SPEC US  GUIDE 05/31/2023 [facility]   CHOLECYSTECTOMY     PERIPHERAL VASCULAR ATHERECTOMY Left 10/18/2018   Procedure: PERIPHERAL VASCULAR ATHERECTOMY;  Surgeon: Avanell Leigh, MD;  Location: [facility];  Service: Cardiovascular;  Laterality: Left;   SFA   PERIPHERAL VASCULAR ATHERECTOMY Right 11/22/2018   Procedure: PERIPHERAL VASCULAR ATHERECTOMY;  Surgeon: Avanell Leigh, MD;  Location: [facility];  Service: Cardiovascular;  Laterality: Right;  SFA   PERIPHERAL VASCULAR ATHERECTOMY  04/23/2020   Procedure: PERIPHERAL VASCULAR ATHERECTOMY;  Surgeon: Avanell Leigh, MD;  Location: [facility] INV[facility] CV [facility];  Service: Cardiovascular;;   PERIPHERAL VASCULAR BALLOON ANGIOPLASTY Right 11/22/2018   Procedure: PERIPHERAL VASCULAR BALLOON ANGIOPLASTY;  Surgeon: Avanell Leigh, MD;  Location: [facility];  Service: Cardiovascular;  Laterality: Right;  SFA   PERIPHERAL VASCULAR BALLOON ANGIOPLASTY  04/23/2020   Procedure: PERIPHERAL VASCULAR BALLOON ANGIOPLASTY;  Surgeon: Avanell Leigh, MD;  Location: [facility];  Service: Cardiovascular;;   PERIPHERAL VASCULAR INTERVENTION Left 04/02/2020   Procedure: PERIPHERAL VASCULAR INTERVENTION;  Surgeon: Avanell Leigh, MD;  Location: [facility];  Service: Cardiovascular;  Laterality: Left;   Patient Active Problem List   Diagnosis Date Noted   Malignant neoplasm of upper-outer quadrant of left breast in female, estrogen receptor negative (HCC) 06/05/2023   Excessive daytime sleepiness 12/22/2022   DOE (dyspnea on exertion) 12/22/2022   Centrilobular emphysema (HCC) 12/22/2022   Claudication in peripheral vascular disease (HCC) 10/04/2018   Essential hypertension 10/04/2018   Hyperlipidemia 10/04/2018   Tobacco abuse 10/04/2018   Family history of heart disease 10/04/2018   REFERRING PROVIDER: Dr. Donavon Fudge  Alethea Andes  REFERRING DIAG: Left breast cancer  THERAPY DIAG:  Malignant neoplasm of upper-outer quadrant of left breast in female, estrogen receptor negative (HCC)  Abnormal posture  Rationale for Evaluation and Treatment: Rehabilitation  ONSET DATE: 05/12/2023  SUBJECTIVE:                                                                                                                                                                                            SUBJECTIVE STATEMENT: Patient reports she is here today to be seen by her medical team for her newly diagnosed left breast cancer.   PERTINENT HISTORY:  Patient was diagnosed on 05/12/2023 with left grade 2 invasive ductal carcinoma breast cancer. It measures 1.2 cm and is located in the upper outer quadrant. It is ER/PR negative and HER2 positive with a Ki67 of 15%. She is on Plavix  for peripheral artery disease and smokes 1 pack/day.  PATIENT GOALS:   reduce lymphedema risk and learn post op HEP.   PAIN:  Are you having pain? Yes: NPRS scale: 6/10 Pain location: "Lots of places because of fibromyalgia" but today in both knees Pain description: achy Aggravating factors: unknown Relieving factors: unknown  PRECAUTIONS: Active CA   RED FLAGS: None   HAND DOMINANCE: right  WEIGHT BEARING RESTRICTIONS: No  FALLS:  Has patient fallen in last 6 months? Yes. Number of falls 2 falls due to "my knees give out". She ambulates with a SPC and reports she has already recently completed physical therapy and aquatic therapy at [facility].  LIVING ENVIRONMENT: Patient lives with: her daughter Lives in: House/apartment Has following equipment at home: Single point cane  OCCUPATION: On disability  LEISURE: She does not exercise  PRIOR LEVEL OF FUNCTION: Independent   OBJECTIVE: Note: Objective measures were completed at Evaluation unless otherwise noted.  COGNITION: Overall cognitive status: Within functional limits for tasks assessed    POSTURE:  Forward head and rounded shoulders posture  UPPER EXTREMITY AROM/PROM:  A/PROM RIGHT   eval   Shoulder extension 47  Shoulder flexion 145  Shoulder abduction 149  Shoulder internal rotation 61  Shoulder external rotation 85    (Blank rows = not tested)  A/PROM LEFT   eval  Shoulder extension 43  Shoulder flexion 135  Shoulder abduction  139  Shoulder internal rotation 61  Shoulder external rotation 78    (Blank rows = not tested)  CERVICAL AROM: All within normal limits  UPPER EXTREMITY STRENGTH: WFL  LYMPHEDEMA ASSESSMENTS (in cm):   LANDMARK RIGHT   eval  10 cm proximal to olecranon process 27.4  Olecranon process 25.8  10 cm proximal  to ulnar styloid process 23.2  Just proximal to ulnar styloid process 17.2  Across hand at thumb web space 20.4  At base of 2nd digit 7.2  (Blank rows = not tested)  LANDMARK LEFT   eval  10 cm proximal to olecranon process 27.1  Olecranon process 25.4  10 cm proximal to ulnar styloid process 20.7  Just proximal to ulnar styloid process 16.9  Across hand at thumb web space 20.2  At base of 2nd digit 7.2  (Blank rows = not tested)  L-DEX LYMPHEDEMA SCREENING:  The patient was assessed using the L-Dex machine today to produce a lymphedema index baseline score. The patient will be reassessed on a regular basis (typically every 3 months) to obtain new L-Dex scores. If the score is > 6.5 points away from his/her baseline score indicating onset of subclinical lymphedema, it will be recommended to wear a compression garment for 4 weeks, 12 hours per day and then be reassessed. If the score continues to be > 6.5 points from baseline at reassessment, we will initiate lymphedema treatment. Assessing in this manner has a 95% rate of preventing clinically significant lymphedema.   L-DEX FLOWSHEETS - 06/07/23 1600       L-DEX LYMPHEDEMA SCREENING   Measurement Type Unilateral    L-DEX MEASUREMENT EXTREMITY Upper Extremity    POSITION  Standing    DOMINANT SIDE Right    At Risk Side Left    BASELINE SCORE (UNILATERAL) 0.7             QUICK DASH SURVEY:  Cindia Crease - 06/07/23 0001     Open a tight or new jar Severe difficulty    Do heavy household chores (wash walls, wash floors) Severe difficulty    Carry a shopping bag or briefcase Moderate difficulty    Wash your back  Severe difficulty    Use a knife to cut food Moderate difficulty    Recreational activities in which you take some force or impact through your arm, shoulder, or hand (golf, hammering, tennis) Severe difficulty    During the past week, to what extent has your arm, shoulder or hand problem interfered with your normal social activities with family, friends, neighbors, or groups? Quite a bit    During the past week, to what extent has your arm, shoulder or hand problem limited your work or other regular daily activities Quite a bit    Arm, shoulder, or hand pain. Moderate    Tingling (pins and needles) in your arm, shoulder, or hand None    Difficulty Sleeping No difficulty    DASH Score 54.55 %              PATIENT EDUCATION:  Education details: Time spent educating patient on aspects of self-care to maximize post op recovery. Patient was educated on where and how to get a post op compression bra to use to reduce post op edema. Patient was also educated on the use of SOZO screenings and surveillance principles for early identification of lymphedema onset. She was instructed to use the post op pillow in the axilla for pressure and pain relief. Patient educated on lymphedema risk reduction and post op shoulder/posture HEP. Person educated: Patient Education method: Explanation, Demonstration, Handout Education comprehension: Patient verbalized understanding and returned demonstration  HOME EXERCISE PROGRAM: Patient was instructed today in a home exercise program today for post op shoulder range of motion. These included active assist shoulder flexion in sitting, scapular retraction, wall walking with shoulder  abduction, and hands behind head external rotation.  She was encouraged to do these twice a day, holding 3 seconds and repeating 5 times when permitted by her physician.   ASSESSMENT:  CLINICAL IMPRESSION: Patient was diagnosed on 05/12/2023 with left grade 2 invasive ductal carcinoma  breast cancer. It measures 1.2 cm and is located in the upper outer quadrant. It is ER/PR negative and HER2 positive with a Ki67 of 15%. She is on Plavix  for peripheral artery disease and smokes 1 pack/day. Her multidisciplinary medical team met prior to her assessments to determine a recommended treatment plan. She is planning to have a left lumpectomy and sentinel node biopsy followed by chemotherapy and radiation. She will benefit from a post op PT reassessment to determine needs and from L-Dex screens every 3 months for 2 years to detect subclinical lymphedema.  Pt will benefit from skilled therapeutic intervention to improve on the following deficits: Decreased knowledge of precautions, impaired UE functional use, pain, decreased ROM, postural dysfunction.   PT treatment/interventions: ADL/self-care home management, pt/family education, therapeutic exercise  REHAB POTENTIAL: Good  CLINICAL DECISION MAKING: Stable/uncomplicated  EVALUATION COMPLEXITY: Low   GOALS: Goals reviewed with patient? YES  LONG TERM GOALS: (STG=LTG)    Name Target Date Goal status  1 Pt will be able to verbalize understanding of pertinent lymphedema risk reduction practices relevant to her dx specifically related to skin care.  Baseline:  No knowledge 06/07/2023 Achieved at eval  2 Pt will be able to return demo and/or verbalize understanding of the post op HEP related to regaining shoulder ROM. Baseline:  No knowledge 06/07/2023 Achieved at eval  3 Pt will be able to verbalize understanding of the importance of viewing the post op After Breast CA Class video for further lymphedema risk reduction education and therapeutic exercise.  Baseline:  No knowledge 06/07/2023 Achieved at eval  4 Pt will demo she has regained full shoulder ROM and function post operatively compared to baselines.  Baseline: See objective measurements taken today. 08/02/2023     PLAN:  PT FREQUENCY/DURATION: EVAL and 1 follow up  appointment.   PLAN FOR NEXT SESSION: will reassess 3-4 weeks post op to determine needs.   Patient will follow up at outpatient cancer rehab 3-4 weeks following surgery.  If the patient requires physical therapy at that time, a specific plan will be dictated and sent to the referring physician for approval. The patient was educated today on appropriate basic range of motion exercises to begin post operatively and the importance of viewing the After Breast Cancer class video following surgery.  Patient was educated today on lymphedema risk reduction practices as it pertains to recommendations that will benefit the patient immediately following surgery.  She verbalized good understanding.    Physical Therapy Information for After Breast Cancer Surgery/Treatment:  Lymphedema is a swelling condition that you may be at risk for in your arm if you have lymph nodes removed from the armpit area.  After a sentinel node biopsy, the risk is approximately 5-9% and is higher after an axillary node dissection.  There is treatment available for this condition and it is not life-threatening.  Contact your physician or physical therapist with concerns. You may begin the 4 shoulder/posture exercises (see additional sheet) when permitted by your physician (typically a week after surgery).  If you have drains, you may need to wait until those are removed before beginning range of motion exercises.  A general recommendation is to not lift your arms above shoulder  height until drains are removed.  These exercises should be done to your tolerance and gently.  This is not a "no pain/no gain" type of recovery so listen to your body and stretch into the range of motion that you can tolerate, stopping if you have pain.  If you are having immediate reconstruction, ask your plastic surgeon about doing exercises as he or she may want you to wait. We encourage you to view the After Breast Cancer class video following surgery.  You  will learn information related to lymphedema risk, prevention and treatment and additional exercises to regain mobility following surgery.   While undergoing any medical procedure or treatment, try to avoid blood pressure being taken or needle sticks from occurring on the arm on the side of cancer.   This recommendation begins after surgery and continues for the rest of your life.  This may help reduce your risk of getting lymphedema (swelling in your arm). An excellent resource for those seeking information on lymphedema is the National Lymphedema Network's web site. It can be accessed at www.lymphnet.org If you notice swelling in your hand, arm or breast at any time following surgery (even if it is many years from now), please contact your doctor or physical therapist to discuss this.  Lymphedema can be treated at any time but it is easier for you if it is treated early on.  If you feel like your shoulder motion is not returning to normal in a reasonable amount of time, please contact your surgeon or physical therapist.  [facility] [phone]. [address], [address], [place] [address]  [facility] After Breast Cancer Class  After Breast Cancer Class is a specially designed exercise class video to assist you in a safe recover after having breast cancer surgery.  In this video you will learn how to get back to full function whether your drains were just removed or if you had surgery a month ago. The video can be viewed on this page: [email or web address]-meyer.[email or web address]/ or on YouTube here: https://[email or web address].  Class Goals  Understand specific stretches to improve the flexibility of you chest and shoulder. Learn ways to safely strengthen your upper body and improve your posture. Understand the warning signs of infection and why you may be at risk for an arm infection. Learn about Lymphedema and  prevention.  ** You do not need to view this video until after surgery.  Drains should be removed to participate in the recommended exercises on the video.  Patient was instructed today in a home exercise program today for post op shoulder range of motion. These included active assist shoulder flexion in sitting, scapular retraction, wall walking with shoulder abduction, and hands behind head external rotation.  She was encouraged to do these twice a day, holding 3 seconds and repeating 5 times when permitted by her physician.  Rollin Clock, [place] 06/07/23 4:40 PM

## 2023-06-07 NOTE — Progress Notes (Signed)
 [facility] CONSULT NOTE  Patient Care Team: Edda Goo, MD as PCP - General (Internal Medicine) O'Neal, Cathay Clonts, MD as PCP - Cardiology (Cardiology) Cameron Cea, MD as Consulting Physician (Hematology and Oncology) Auther Bo, RN as Oncology Nurse Navigator Alane Hsu, RN as Oncology Nurse Navigator Caralyn Chandler, MD as Consulting Physician (General Surgery) Colie Dawes, MD as Attending Physician (Radiation Oncology)  CHIEF COMPLAINTS/PURPOSE OF CONSULTATION:  Newly diagnosed breast cancer  HISTORY OF PRESENTING ILLNESS:  Mary Campbell is 58 year old who had a screening mammogram detected left breast mass and asymmetry retroareolar region was a irregular spiculated mass measuring 1.2 cm by ultrasound axilla was negative.  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  She was presented this morning at the ultrasound and tumor board and she is here today accompanied by her daughter to discuss her treatment plan.   I reviewed her records extensively and collaborated the history with the patient.  SUMMARY OF ONCOLOGIC HISTORY: Oncology History  Malignant neoplasm of upper-outer quadrant of left breast in female, estrogen receptor negative (HCC)  05/31/2023 Initial Diagnosis   Screening mammogram detected left breast mass and asymmetry retroareolar region, irregular spiculated mass 1.1 cm, axilla negative, ultrasound measured 1.2 cm, biopsy: Grade 2 IDC ER 0% PR 0% Ki67 15%, HER2 3+ positive      MEDICAL HISTORY:  Past Medical History:  Diagnosis Date   Chronic back pain    Diabetes mellitus    Hyperlipidemia    Hypertension    PAD (peripheral artery disease) (HCC)    a. PV angio 10/2018 - bilateral SFA disease s/p L intervention.   PAD (peripheral artery disease) (HCC)    Tobacco abuse     SURGICAL HISTORY: Past Surgical History:  Procedure Laterality Date   ABDOMINAL AORTOGRAM W/LOWER EXTREMITY  Right 10/18/2018   Procedure: ABDOMINAL AORTOGRAM W/LOWER EXTREMITY;  Surgeon: Avanell Leigh, MD;  [facility];  Service: Cardiovascular;  Laterality: Right;   ABDOMINAL AORTOGRAM W/LOWER EXTREMITY N/A 04/02/2020   Procedure: ABDOMINAL AORTOGRAM W/LOWER EXTREMITY;  Surgeon: Avanell Leigh, MD;  [facility];  Service: Cardiovascular;  Laterality: N/A;   ABDOMINAL AORTOGRAM W/LOWER EXTREMITY N/A 04/23/2020   Procedure: ABDOMINAL AORTOGRAM W/LOWER EXTREMITY;  Surgeon: Avanell Leigh, MD;  [facility];  Service: Cardiovascular;  Laterality: N/A;   ABDOMINAL HYSTERECTOMY     BREAST BIOPSY Left 05/31/2023   US  LT BREAST BX W LOC DEV 1ST LESION IMG BX SPEC US  GUIDE 05/31/2023 [facility] MAMMOGRAPHY   CHOLECYSTECTOMY     PERIPHERAL VASCULAR ATHERECTOMY Left 10/18/2018   Procedure: PERIPHERAL VASCULAR ATHERECTOMY;  Surgeon: Avanell Leigh, MD;  [facility];  Service: Cardiovascular;  Laterality: Left;  SFA   PERIPHERAL VASCULAR ATHERECTOMY Right 11/22/2018   Procedure: PERIPHERAL VASCULAR ATHERECTOMY;  Surgeon: Avanell Leigh, MD;  Location: [facility] INVAS[facility] CV [facility];  Service: Cardiovascular;  Laterality: Right;  SFA   PERIPHERAL VASCULAR ATHERECTOMY  04/23/2020   Procedure: PERIPHERAL VASCULAR ATHERECTOMY;  Surgeon: Avanell Leigh, MD;  Location: [facility] INVASIVE CV LAB;  Service: Cardiovascular;;   PERIPHERAL VASCULAR BALLOON ANGIOPLASTY Right 11/22/2018   Procedure: PERIPHERAL VASCULAR BALLOON ANGIOPLASTY;  Surgeon: Avanell Leigh, MD;  [facility];  Service: Cardiovascular;  Laterality: Right;  SFA   PERIPHERAL VASCULAR BALLOON ANGIOPLASTY  04/23/2020   Procedure: PERIPHERAL VASCULAR BALLOON ANGIOPLASTY;  Surgeon: Avanell Leigh, MD;  [facility];  Service: Cardiovascular;;   PERIPHERAL VASCULAR INTERVENTION Left 04/02/2020   Procedure: PERIPHERAL VASCULAR INTERVENTION;  Surgeon: Avanell Leigh, MD;   [facility];  Service: Cardiovascular;  Laterality: Left;    SOCIAL HISTORY: Social History   Socioeconomic History   Marital status: Legally Separated    Spouse name: Not on file   Number of children: Not on file   Years of education: Not on file   Highest education level: Not on file  Occupational History   Not on file  Tobacco Use   Smoking status: Former    Current packs/day: 0.00    Types: Cigarettes    Quit date: 08/11/2022    Years since quitting: 0.8   Smokeless tobacco: Never  Vaping Use   Vaping status: Never Used  Substance and Sexual Activity   Alcohol use: No   Drug use: No   Sexual activity: Yes    Birth control/protection: Surgical  Other Topics Concern   Not on file  Social History Narrative   Not on file   Social Drivers of Health   Financial Resource Strain: Not on file  Food Insecurity: No Food Insecurity (06/07/2023)   Hunger Vital Sign    Worried About Running Out of Food in the Last Year: Never true    Ran Out of Food in the Last Year: Never true  Transportation Needs: No Transportation Needs (06/07/2023)   PRAPARE - Administrator, Civil Service (Medical): No    Lack of Transportation (Non-Medical): No  Physical Activity: Not on file  Stress: Not on file  Social Connections: Not on file  Intimate Partner Violence: Not At Risk (06/07/2023)   Humiliation, Afraid, Rape, and Kick questionnaire    Fear of Current or Ex-Partner: No    Emotionally Abused: No    Physically Abused: No    Sexually Abused: No    FAMILY HISTORY: Family History  Problem Relation Age of Onset   Diabetes Mother    Hypertension Mother    Hyperlipidemia Mother    Heart attack Father    Heart disease Father    Hypertension Father    Hyperlipidemia Father    Breast cancer Neg Hx     ALLERGIES:  is allergic to codeine, lisinopril, and sulfonamide derivatives.  MEDICATIONS:  Current Outpatient Medications  Medication Sig Dispense Refill    acetaminophen  (TYLENOL ) 650 MG CR tablet Take 1,300 mg by mouth every 8 (eight) hours as needed for pain.     albuterol  (VENTOLIN  HFA) 108 (90 Base) MCG/ACT inhaler Inhale 2 puffs into the lungs every 6 (six) hours as needed for wheezing or shortness of breath. 8 g 2   amLODipine-benazepril (LOTREL) 5-20 MG capsule Take 1 capsule by mouth daily.     aspirin  EC 81 MG tablet Take 81 mg by mouth daily.     clobetasol cream (TEMOVATE) 0.05 % Apply 1 Application topically 2 (two) times daily.     clopidogrel  (PLAVIX ) 75 MG tablet Take 1 tablet (75 mg total) by mouth daily. 90 tablet 1   diphenhydrAMINE  (BENADRYL ) 25 mg capsule Take 25 mg by mouth every 6 (six) hours as needed for allergies.     DULoxetine (CYMBALTA) 60 MG capsule Take 60 mg by mouth daily.     fluticasone (FLONASE) 50 MCG/ACT nasal spray Place 1 spray into both nostrils 2 (two) times daily.     gabapentin (NEURONTIN) 300 MG capsule Take 300 mg by mouth  2 (two) times daily.     insulin  glargine (LANTUS ) 100 UNIT/ML injection Inject 48 Units into the skin at bedtime.     JANUMET XR 50-1000 MG TB24 Take 1 tablet by mouth in the morning and at bedtime.     rosuvastatin  (CRESTOR ) 40 MG tablet Take 1 tablet (40 mg total) by mouth daily. 90 tablet 3   ibuprofen (ADVIL) 200 MG tablet Take 400 mg by mouth every 6 (six) hours as needed for moderate pain (pain score 4-6) or headache.     Current Facility-Administered Medications  Medication Dose Route Frequency Provider Last Rate Last Admin   sodium chloride  flush (NS) 0.9 % injection 3 mL  3 mL Intravenous Q12H Avanell Leigh, MD       sodium chloride  flush (NS) 0.9 % injection 3 mL  3 mL Intravenous Q12H Avanell Leigh, MD        REVIEW OF SYSTEMS:   Constitutional: Denies fevers, chills or abnormal night sweats Breast:  Denies any palpable lumps or discharge All other systems were reviewed with the patient and are negative.  PHYSICAL EXAMINATION: ECOG PERFORMANCE STATUS: 0 -  Asymptomatic  Vitals:   06/07/23 1312  BP: (!) 150/96  Pulse: 91  Resp: 18  Temp: 97.6 F (36.4 C)  SpO2: 97%   Filed Weights   06/07/23 1312  Weight: 172 lb (78 kg)    GENERAL:alert, no distress and comfortable    LABORATORY DATA:  I have reviewed the data as listed Lab Results  Component Value Date   WBC 10.5 06/07/2023   HGB 12.7 06/07/2023   HCT 37.7 06/07/2023   MCV 86.5 06/07/2023   PLT 293 06/07/2023   Lab Results  Component Value Date   NA 138 06/07/2023   K 4.1 06/07/2023   CL 103 06/07/2023   CO2 30 06/07/2023    RADIOGRAPHIC STUDIES: I have personally reviewed the radiological reports and agreed with the findings in the report.  ASSESSMENT AND PLAN:  Malignant neoplasm of upper-outer quadrant of left breast in female, estrogen receptor negative (HCC) 05/31/2023:Screening mammogram detected left breast mass and asymmetry retroareolar region, irregular spiculated mass 1.1 cm, axilla negative, ultrasound measured 1.2 cm, biopsy: Grade 2 IDC ER 0% PR 0% Ki67 15%, HER2 3+ positive  Pathology and radiology counseling: Discussed with the patient, the details of pathology including the type of breast cancer,the clinical staging, the significance of ER, PR and HER-2/neu receptors and the implications for treatment. After reviewing the pathology in detail, we proceeded to discuss the different treatment options between surgery, radiation, chemotherapy, antiestrogen therapies.  Treatment plan: Breast conserving surgery with sentinel lymph node biopsy Adjuvant chemotherapy with Taxol Herceptin followed by Herceptin maintenance for 1 year Adjuvant radiation therapy  Chemotherapy Counseling: I discussed the risks and benefits of chemotherapy including the risks of nausea/ vomiting, risk of infection from low WBC count, fatigue due to chemo or anemia, bruising or bleeding due to low platelets, mouth sores, loss/ change in taste and decreased appetite. Liver and kidney  function will be monitored through out chemotherapy as abnormalities in liver and kidney function may be a side effect of treatment. Cardiac dysfunction due to   Herceptin was discussed in detail. Risk of permanent bone marrow dysfunction and leukemia due to chemo were also discussed.  Return to clinic after surgery to discuss final pathology report and come up with the treatment plan.     All questions were answered. The patient knows to call the clinic  with any problems, questions or concerns.    Viinay K Fin Hupp, MD 06/07/23

## 2023-06-07 NOTE — Assessment & Plan Note (Addendum)
 05/31/2023:Screening mammogram detected left breast mass and asymmetry retroareolar region, irregular spiculated mass 1.1 cm, axilla negative, ultrasound measured 1.2 cm, biopsy: Grade 2 IDC ER 0% PR 0% Ki67 15%, HER2 3+ positive  Pathology and radiology counseling: Discussed with the patient, the details of pathology including the type of breast cancer,the clinical staging, the significance of ER, PR and HER-2/neu receptors and the implications for treatment. After reviewing the pathology in detail, we proceeded to discuss the different treatment options between surgery, radiation, chemotherapy, antiestrogen therapies.  Treatment plan: Breast conserving surgery with sentinel lymph node biopsy Adjuvant chemotherapy with Taxol Herceptin followed by Herceptin maintenance for 1 year Adjuvant radiation therapy  Chemotherapy Counseling: I discussed the risks and benefits of chemotherapy including the risks of nausea/ vomiting, risk of infection from low WBC count, fatigue due to chemo or anemia, bruising or bleeding due to low platelets, mouth sores, loss/ change in taste and decreased appetite. Liver and kidney function will be monitored through out chemotherapy as abnormalities in liver and kidney function may be a side effect of treatment. Cardiac dysfunction due to   Herceptin was discussed in detail. Risk of permanent bone marrow dysfunction and leukemia due to chemo were also discussed.  Return to clinic after surgery to discuss final pathology report and come up with the treatment plan.

## 2023-06-07 NOTE — Telephone Encounter (Signed)
   Pre-operative Risk Assessment    Patient Name: Mary Campbell  DOB: [birth year] MRN: [id number]   Date of last office visit: 05/10/2023 Date of next office visit: None  Request for Surgical Clearance    Procedure:  Lumpectomy Surgery  Date of Surgery:  Clearance TBD                                 Surgeon:  Dr. Lillette Reid [address] Surgeon's Group or Practice Name:  [facility] Phone number:  [phone] Fax number:  [phone]   Type of Clearance Requested:   - Medical  - Pharmacy:  Hold Aspirin  and Clopidogrel  (Plavix ) Not Indicated   Type of Anesthesia:  General    Additional requests/questions:    Signed, Lauris Port   06/07/2023, 4:35 PM

## 2023-06-07 NOTE — Progress Notes (Signed)
 [facility] Multidisciplinary Clinic Spiritual Care Note  Met with Mary Campbell and her daughter Mary Campbell in Breast Multidisciplinary Clinic to introduce Support Center team/resources. [place] completed SDOH screening; results follow below.    SDOH Screenings   Food Insecurity: No Food Insecurity (06/07/2023)  Housing: Unknown (06/07/2023)  Transportation Needs: No Transportation Needs (06/07/2023)  Utilities: Not At Risk (06/07/2023)  Depression (PHQ2-9): Low Risk  (06/07/2023)  Tobacco Use: High Risk (06/07/2023)   Received from [facility] and patient discussed common feelings and emotions when being diagnosed with cancer, and the importance of support during treatment.  Chaplain informed patient of the support team and support services at [facility].  Chaplain provided contact information and encouraged patient to call with any questions or concerns.  Follow up needed: Yes.  We plan to follow up by phone in ca two weeks for Spiritual Care check-in, revisiting interest in Sports coach.   [address] Dorice Gardner, [place], [facility] Pager [phone] Voicemail [phone]

## 2023-06-08 ENCOUNTER — Telehealth: Payer: Self-pay

## 2023-06-08 DIAGNOSIS — Z171 Estrogen receptor negative status [ER-]: Secondary | ICD-10-CM

## 2023-06-08 NOTE — Progress Notes (Signed)
 REFERRING PROVIDER: Cameron Cea, MD [address] [place],  [place] [address]  PRIMARY PROVIDER:  Edda Goo, MD  PRIMARY REASON FOR VISIT:  1. Malignant neoplasm of upper-outer quadrant of left breast in female, estrogen receptor negative (HCC)     HISTORY OF PRESENT ILLNESS:   Mary Campbell, a 58 y.o. female, was seen for a [place] cancer genetics consultation at the request of Dr. Lee Public  due to a personal history of breast cancer.  Mary Campbell presents to clinic today to discuss the possibility of a hereditary predisposition to cancer, to discuss genetic testing, and to further clarify her future cancer risks, as well as potential cancer risks for family members.   In May 2025, at the age of 57, Mary Campbell was diagnosed with invasive ductal carcinoma of the left breast (ER-/PR-/HER2+). The preliminary treatment plan includes breast conserving surgery, adjuvant chemotherapy, and adjuvant radiation.   CANCER HISTORY:  Oncology History  Malignant neoplasm of upper-outer quadrant of left breast in female, estrogen receptor negative (HCC)  05/31/2023 Initial Diagnosis   Screening mammogram detected left breast mass and asymmetry retroareolar region, irregular spiculated mass 1.1 cm, axilla negative, ultrasound measured 1.2 cm, biopsy: Grade 2 IDC ER 0% PR 0% Ki67 15%, HER2 3+ positive   06/07/2023 Cancer Staging   Staging form: Breast, AJCC 8th Edition - Clinical: Stage IA (cT1c, cN0, cM0, G2, ER-, PR-, HER2+) - Signed by Cameron Cea, MD on 06/07/2023 Stage prefix: Initial diagnosis Histologic grading system: 3 grade system      Past Medical History:  Diagnosis Date   Chronic back pain    Diabetes mellitus    Hyperlipidemia    Hypertension    PAD (peripheral artery disease) (HCC)    a. PV angio 10/2018 - bilateral SFA disease s/p L intervention.   PAD (peripheral artery disease) (HCC)    Tobacco abuse     Past Surgical History:  Procedure Laterality Date    ABDOMINAL AORTOGRAM W/LOWER EXTREMITY Right 10/18/2018   Procedure: ABDOMINAL AORTOGRAM W/LOWER EXTREMITY;  Surgeon: Avanell Leigh, MD;  [facility];  Service: Cardiovascular;  Laterality: Right;   ABDOMINAL AORTOGRAM W/LOWER EXTREMITY N/A 04/02/2020   Procedure: ABDOMINAL AORTOGRAM W/LOWER EXTREMITY;  Surgeon: Avanell Leigh, MD;  [facility];  Service: Cardiovascular;  Laterality: N/A;   ABDOMINAL AORTOGRAM W/LOWER EXTREMITY N/A 04/23/2020   Procedure: ABDOMINAL AORTOGRAM W/LOWER EXTREMITY;  Surgeon: Avanell Leigh, MD;  Location: [facility];  Service: Cardiovascular;  Laterality: N/A;   ABDOMINAL HYSTERECTOMY     BREAST BIOPSY Left 05/31/2023   US  LT BREAST BX W LOC DEV 1ST LESION IMG BX SPEC US  GUIDE 05/31/2023 [facility] MAMMOGRAPHY   CHOLECYSTECTOMY     PERIPHERAL VASCULAR ATHERECTOMY Left 10/18/2018   Procedure: PERIPHERAL VASCULAR ATHERECTOMY;  Surgeon: Avanell Leigh, MD;  [facility];  Service: Cardiovascular;  Laterality: Left;  SFA   PERIPHERAL VASCULAR ATHERECTOMY Right 11/22/2018   Procedure: PERIPHERAL VASCULAR ATHERECTOMY;  Surgeon: Avanell Leigh, MD;  Location: [facility] INVASIVE CV LAB;  Service: Cardiovascular;  Laterality: Right;  SFA   PERIPHERAL VASCULAR ATHERECTOMY  04/23/2020   Procedure: PERIPHERAL VASCULAR ATHERECTOMY;  Surgeon: Avanell Leigh, MD;  Location: [facility] INVASIVE CV LAB;  Service: Cardiovascular;;   PERIPHERAL VASCULAR BALLOON ANGIOPLASTY Right 11/22/2018   Procedure: PERIPHERAL VASCULAR BALLOON ANGIOPLASTY;  Surgeon: Avanell Leigh, MD;  [facility];  Service: Cardiovascular;  Laterality: Right;  SFA  PERIPHERAL VASCULAR BALLOON ANGIOPLASTY  04/23/2020   Procedure: PERIPHERAL VASCULAR BALLOON ANGIOPLASTY;  Surgeon: Avanell Leigh, MD;  [facility];  Service: Cardiovascular;;   PERIPHERAL VASCULAR INTERVENTION Left 04/02/2020   Procedure: PERIPHERAL VASCULAR  INTERVENTION;  Surgeon: Avanell Leigh, MD;  [facility];  Service: Cardiovascular;  Laterality: Left;    FAMILY HISTORY:  We obtained a detailed, 4-generation family history.  She did not report a known family history of cancer.     Mary Campbell is unaware of previous family history of genetic testing for hereditary cancer risks.  There is no reported Ashkenazi Jewish ancestry. There is no known consanguinity.  GENETIC COUNSELING ASSESSMENT: Mary Campbell is a 58 y.o. female with a personal history of breast cancer which is not highly suggestive of a hereditary cancer syndrome. We, therefore, discussed and recommended the following at today's visit.   DISCUSSION:  We discussed that, in general, most cancer is not inherited in families, but instead is sporadic or familial. Sporadic cancers occur by chance and typically happen at older ages (>50 years) as this type of cancer is caused by genetic changes acquired during an individual's lifetime. Some families have more cancers than would be expected by chance; however, the ages or types of cancer are not consistent with a known genetic mutation or known genetic mutations have been ruled out. This type of familial cancer is thought to be due to a combination of multiple genetic, environmental, hormonal, and lifestyle factors. While this combination of factors likely increases the risk of cancer, the exact source of this risk is not currently identifiable or testable.    We discussed that approximately 5-10% of cancer is hereditary, meaning that it is due to a mutation in a single gene that is passed down from generation to generation in a family. Most hereditary cases of breast cancer are associated with mutations in BRCA1/2. There are other genes that can be associated an increased risk for breast cancer. We discussed that testing can be beneficial for several reasons, including knowing about other cancer risks, identifying potential  screening and risk-reduction options that may be appropriate, and to understand if other family members could be at risk for cancer and allow them to undergo genetic testing.  We reviewed the characteristics, features and inheritance patterns of hereditary cancer syndromes. We also discussed genetic testing, including the appropriate family members to test, the process of testing, insurance coverage and turn-around-time for results. We discussed the implications of a negative, positive, carrier and/or variant of uncertain significant result.   We discussed that while her family history if not highly suggestive of a hereditary cancer syndrome, she is still a candidate for genetic testing given her age of diagnosis.  We offered Mary Campbell genetic testing for a panel that includes genes associated with breast and other cancers.   PLAN:  Mary Campbell did not wish to pursue genetic testing at today's visit. She feels her chances of a hereditary mutation is low given her large family with no known cancer diagnoses.  We remain available to coordinate genetic testing at any time in the future. We, therefore, recommend Mary Campbell continue to follow the cancer screening guidelines given by her primary healthcare provider.  Relatives should inform their providers of the family history of cancer.   Mary Campbell questions were answered to her satisfaction today. Our contact information was provided should additional questions or concerns arise. Thank you for the referral and allowing us  to  share in the care of your patient.   Zilla Shartzer M. Ora Billing, MS, [facility] Genetic Counselor [email or web address] [phone]   Less than 15 minutes minutes were spent on the date of the encounter in service to the patient including preparation, face-to-face consultation, documentation and care coordination.  The patient was seen alone.  Dr. Gudena was available to discuss this case as needed.     _______________________________________________________________________ For Office Staff:  Number of people involved in session: 1 Was an Intern/ student involved with case: no

## 2023-06-08 NOTE — Telephone Encounter (Signed)
 S2205, ICE COMPRESS: RANDOMIZED TRIAL OF LIMB CRYOCOMPRESSION VERSUS CONTINUOUS COMPRESSION VERSUS LOW CYCLIC COMPRESSION FOR THE PREVENTION OF TAXANE-INDUCED PERIPHERAL NEUROPATHY    Patient Mary Campbell was identified by Dr. Gudena as a potential candidate for the above study during Breast Clinic on 06/07/2023. This clinical research coordinator attempted to contact patient to introduce the above study. A voicemail with callback number was left.  Quaron Delacruz, Ph.D. Clinical Research Coordinator [phone] 06/08/2023 2:21 PM

## 2023-06-08 NOTE — Telephone Encounter (Signed)
 [employer] 2021-05 - Specimen Collection Study to Evaluate Biomarkers in Subjects with Cancer    Ms. Gwinner called in at 2:24 pm. This CRC briefly introduced the study. Patient stated she has no questions at this time and declined participation in the study. She was thanked for her time.  Angelli Baruch, Ph.D. Clinical Research Coordinator [phone] 06/08/2023 2:36 PM

## 2023-06-08 NOTE — Telephone Encounter (Signed)
 [employer] 2021-05 - Specimen Collection Study to Evaluate Biomarkers in Subjects with Cancer    Patient Mary Campbell was identified by Dr. Gudena as a potential candidate for the above study during Breast Clinic on 06/07/2023. This clinical research coordinator attempted to contact patient to introduce the above study. A voicemail with callback number was left.  Azaylah Stailey, Ph.D. Clinical Research Coordinator [phone] 06/08/2023 2:19 PM

## 2023-06-08 NOTE — Telephone Encounter (Signed)
 Z3664, ICE COMPRESS: RANDOMIZED TRIAL OF LIMB CRYOCOMPRESSION VERSUS CONTINUOUS COMPRESSION VERSUS LOW CYCLIC COMPRESSION FOR THE PREVENTION OF TAXANE-INDUCED PERIPHERAL NEUROPATHY   Ms. Mickel called in at 2:24 pm. This CRC briefly introduced the study. Patient stated interest in the study and agreed to be contacted by research team as her taxane-based chemo start date approaches. She stated has no questions at this time and knows to contact research team with any future questions or concerns. She was thanked for her time.   Kathleen Tamm, Ph.D. Clinical Research Coordinator [phone] 06/08/2023 2:47 PM

## 2023-06-09 ENCOUNTER — Other Ambulatory Visit: Payer: Self-pay | Admitting: General Surgery

## 2023-06-09 DIAGNOSIS — Z171 Estrogen receptor negative status [ER-]: Secondary | ICD-10-CM

## 2023-06-15 ENCOUNTER — Encounter: Payer: Self-pay | Admitting: Gastroenterology

## 2023-06-15 ENCOUNTER — Ambulatory Visit (INDEPENDENT_AMBULATORY_CARE_PROVIDER_SITE_OTHER): Admitting: Gastroenterology

## 2023-06-15 ENCOUNTER — Encounter: Payer: Self-pay | Admitting: *Deleted

## 2023-06-15 ENCOUNTER — Telehealth: Payer: Self-pay | Admitting: *Deleted

## 2023-06-15 VITALS — BP 140/60 | HR 82 | Ht 64.5 in | Wt 171.0 lb

## 2023-06-15 DIAGNOSIS — Z7902 Long term (current) use of antithrombotics/antiplatelets: Secondary | ICD-10-CM | POA: Insufficient documentation

## 2023-06-15 DIAGNOSIS — R195 Other fecal abnormalities: Secondary | ICD-10-CM | POA: Insufficient documentation

## 2023-06-15 MED ORDER — NA SULFATE-K SULFATE-MG SULF 17.5-3.13-1.6 GM/177ML PO SOLN: 1.0000 | Freq: Once | ORAL | 0 refills | Status: AC

## 2023-06-15 NOTE — Telephone Encounter (Signed)
 Spoke with patient to follow up from [facility] and assess navigation needs. Patient denies any questions or concerns at this time. Encouraged her to call should anything arise.

## 2023-06-15 NOTE — Progress Notes (Signed)
 06/15/2023 Mary Campbell [id number] 09-18-1965   HISTORY OF PRESENT ILLNESS: This is a 58 year old female who is new to our office.  She has been referred here for colonoscopy in regards to a positive Cologuard study.  She has never had colon cancer screening prior to this.  Had a positive Cologuard in March.  Unfortunately, she has also been diagnosed with breast cancer and will be having that surgery on June 12.  And she is then having an echo on June 16.  Does not have any cardiac symptoms, but they wanted that updated and done before she starts 12 weeks of chemotherapy.  She is also on Plavix  for history of PAD, which is prescribed by Dr. Katheryne Pane.  He saw her recently and is updating Dopplers on her legs as well, but once again no complaints.  She denies any GI complaints.  Says she gets occasionally constipated, but knows that she does not drink enough water.  She does not ever have to take anything to help her move her bowels.  No rectal bleeding, but she says that she knows that she has hemorrhoids because when she is constipated they become irritated.  No family history of gastrointestinal/colon cancer.   Past Medical History:  Diagnosis Date   Anxiety    Arthritis    Breast cancer (HCC)    left   Chronic back pain    COPD (chronic obstructive pulmonary disease) (HCC)    Diabetes mellitus    Diverticulitis    Fibromyalgia    Hyperlipidemia    Hypertension    PAD (peripheral artery disease) (HCC)    a. PV angio 10/2018 - bilateral SFA disease s/p L intervention.   PAD (peripheral artery disease) (HCC)    Sleep apnea    Tobacco abuse    Past Surgical History:  Procedure Laterality Date   ABDOMINAL AORTOGRAM W/LOWER EXTREMITY Right 10/18/2018   Procedure: ABDOMINAL AORTOGRAM W/LOWER EXTREMITY;  Surgeon: Avanell Leigh, MD;  Location: [facility];  Service: Cardiovascular;  Laterality: Right;   ABDOMINAL AORTOGRAM W/LOWER EXTREMITY N/A 04/02/2020   Procedure: ABDOMINAL  AORTOGRAM W/LOWER EXTREMITY;  Surgeon: Avanell Leigh, MD;  Location: [facility];  Service: Cardiovascular;  Laterality: N/A;   ABDOMINAL AORTOGRAM W/LOWER EXTREMITY N/A 04/23/2020   Procedure: ABDOMINAL AORTOGRAM W/LOWER EXTREMITY;  Surgeon: Avanell Leigh, MD;  Location: [facility];  Service: Cardiovascular;  Laterality: N/A;   ABDOMINAL HYSTERECTOMY     BREAST BIOPSY Left 05/31/2023   US  LT BREAST BX W LOC DEV 1ST LESION IMG BX SPEC US  GUIDE 05/31/2023 [facility]   CHOLECYSTECTOMY     PERIPHERAL VASCULAR ATHERECTOMY Left 10/18/2018   Procedure: PERIPHERAL VASCULAR ATHERECTOMY;  Surgeon: Avanell Leigh, MD;  Location: [facility];  Service: Cardiovascular;  Laterality: Left;  SFA   PERIPHERAL VASCULAR ATHERECTOMY Right 11/22/2018   Procedure: PERIPHERAL VASCULAR ATHERECTOMY;  Surgeon: Avanell Leigh, MD;  Location: [facility] INVASIVE CV LAB;  Service: Cardiovascular;  Laterality: Right;  SFA   PERIPHERAL VASCULAR ATHERECTOMY  04/23/2020   Procedure: PERIPHERAL VASCULAR ATHERECTOMY;  Surgeon: Avanell Leigh, MD;  Location[facility] CV LAB;  Service: Cardiovascular;;   PERIPHERAL VASCULAR BALLOON ANGIOPLASTY Right 11/22/2018   Procedure: PERIPHERAL VASCULAR BALLOON ANGIOPLASTY;  Surgeon: Avanell Leigh, MD;  Location: [facility];  Service: Cardiovascular;  Laterality: Right;  SFA   PERIPHERAL VASCULAR BALLOON ANGIOPLASTY  04/23/2020   Procedure: PERIPHERAL VASCULAR BALLOON ANGIOPLASTY;  Surgeon:  Avanell Leigh, MD;  Location: [facility];  Service: Cardiovascular;;   PERIPHERAL VASCULAR INTERVENTION Left 04/02/2020   Procedure: PERIPHERAL VASCULAR INTERVENTION;  Surgeon: Avanell Leigh, MD;  Location: [facility];  Service: Cardiovascular;  Laterality: Left;    reports that she has been smoking cigarettes. She has never used smokeless tobacco. She reports that she does not drink alcohol and does not use drugs. family history includes  Diabetes in her mother; Heart attack in her father; Heart disease in her father; Hyperlipidemia in her father and mother; Hypertension in her father and mother. Allergies  Allergen Reactions   Codeine Hives   Lisinopril Cough   Sulfonamide Derivatives Hives      Outpatient Encounter Medications as of 06/15/2023  Medication Sig   acetaminophen  (TYLENOL ) 650 MG CR tablet Take 1,300 mg by mouth every 8 (eight) hours as needed for pain.   albuterol  (VENTOLIN  HFA) 108 (90 Base) MCG/ACT inhaler Inhale 2 puffs into the lungs every 6 (six) hours as needed for wheezing or shortness of breath.   amLODipine-benazepril (LOTREL) 5-20 MG capsule Take 1 capsule by mouth daily.   aspirin  EC 81 MG tablet Take 81 mg by mouth daily.   buPROPion (WELLBUTRIN XL) 150 MG 24 hr tablet Take 150 mg by mouth daily.   clobetasol cream (TEMOVATE) 0.05 % Apply 1 Application topically 2 (two) times daily.   clopidogrel  (PLAVIX ) 75 MG tablet Take 1 tablet (75 mg total) by mouth daily.   diphenhydrAMINE  (BENADRYL ) 25 mg capsule Take 25 mg by mouth every 6 (six) hours as needed for allergies.   DULoxetine (CYMBALTA) 30 MG capsule Take 30 mg by mouth daily.   DULoxetine (CYMBALTA) 60 MG capsule Take 60 mg by mouth daily.   gabapentin (NEURONTIN) 300 MG capsule Take 300 mg by mouth 2 (two) times daily.   insulin  glargine (LANTUS ) 100 UNIT/ML injection Inject 48 Units into the skin at bedtime.   JANUMET XR 50-1000 MG TB24 Take 1 tablet by mouth in the morning and at bedtime.   Na Sulfate-K Sulfate-Mg Sulfate concentrate (SUPREP) 17.5-3.13-1.6 GM/177ML SOLN Take 1 kit (354 mLs total) by mouth once for 1 dose.   nicotine (NICODERM CQ - DOSED IN MG/24 HOURS) 21 mg/24hr patch Place 1 patch onto the skin daily.   rosuvastatin  (CRESTOR ) 40 MG tablet Take 1 tablet (40 mg total) by mouth daily.   [DISCONTINUED] fluticasone (FLONASE) 50 MCG/ACT nasal spray Place 1 spray into both nostrils 2 (two) times daily.   [DISCONTINUED]  ibuprofen (ADVIL) 200 MG tablet Take 400 mg by mouth every 6 (six) hours as needed for moderate pain (pain score 4-6) or headache.   Facility-Administered Encounter Medications as of 06/15/2023  Medication   sodium chloride  flush (NS) 0.9 % injection 3 mL   sodium chloride  flush (NS) 0.9 % injection 3 mL     REVIEW OF SYSTEMS  : All other systems reviewed and negative except where noted in the History of Present Illness.   PHYSICAL EXAM: BP (!) 144/68   Pulse 82   Ht 5' 4.5" (1.638 m)   Wt 171 lb (77.6 kg)   BMI 28.90 kg/m  General: Well developed female in no acute distress Head: Normocephalic and atraumatic Eyes:  Sclerae anicteric, conjunctiva pink. Ears: Normal auditory acuity Lungs: Clear throughout to auscultation; no W/R/R. Heart: Regular rate and rhythm; no M/R/G. Rectal:  Will be done at the time of colonoscopy. Musculoskeletal: Symmetrical with no gross deformities  Skin: No  lesions on visible extremities Neurological: Alert oriented x 4, grossly non-focal Psychological:  Alert and cooperative. Normal mood and affect  ASSESSMENT AND PLAN: *Positive Cologuard: No GI complaints.  Never had a colonoscopy in the past.  This was her first colon cancer screening.  Will schedule with Dr. Brice Campi the end of June/beginning of July.  She just diagnosed with breast cancer and is having that surgery on June 12 and is having an echo on June 16.  She not having any cardiac symptoms, but they want updated echo before starting her chemotherapy. *Chronic antiplatelet use with Plavix  due to arterial disease:  Hold Plavix   for 5 days before procedure - will instruct when and how to resume after procedure. Risks and benefits of procedure including bleeding, perforation, infection, missed lesions, medication reactions and possible hospitalization or surgery if complications occur explained. Additional rare but real risk of cardiovascular event such as heart attack or ischemia/infarct of other  organs off of Plavix  explained and need to seek urgent help if this occurs. Will communicate by phone or EMR with patient's prescribing provider, Dr. Katheryne Pane, to confirm that holding Plavix  is reasonable in this case.     CC:  Isabelle Maple, PA-C

## 2023-06-15 NOTE — Patient Instructions (Signed)
 You have been scheduled for a colonoscopy. Please follow written instructions given to you at your visit today.   If you use inhalers (even only as needed), please bring them with you on the day of your procedure.  DO NOT TAKE 7 DAYS PRIOR TO TEST- Trulicity (dulaglutide) Ozempic, Wegovy (semaglutide) Mounjaro (tirzepatide) Bydureon Bcise (exanatide extended release)  DO NOT TAKE 1 DAY PRIOR TO YOUR TEST Rybelsus (semaglutide) Adlyxin (lixisenatide) Victoza (liraglutide) Byetta (exanatide) _______________________________________________________  If your blood pressure at your visit was 140/90 or greater, please contact your primary care physician to follow up on this.  _______________________________________________________  If you are age 59 or older, your body mass index should be between 23-30. Your Body mass index is 28.9 kg/m. If this is out of the aforementioned range listed, please consider follow up with your Primary Care Provider.  If you are age 53 or younger, your body mass index should be between 19-25. Your Body mass index is 28.9 kg/m. If this is out of the aformentioned range listed, please consider follow up with your Primary Care Provider.   ________________________________________________________  The [facility] providers would like to encourage you to use MYCHART to communicate with providers for non-urgent requests or questions.  Due to long hold times on the telephone, sending your provider a message by [facility] may be a faster and more efficient way to get a response.  Please allow 48 business hours for a response.  Please remember that this is for non-urgent requests.  _______________________________________________________

## 2023-06-16 NOTE — Progress Notes (Signed)
 Attending Physician's Attestation   I have reviewed the chart.   I agree with the Advanced Practitioner's note, impression, and recommendations with any updates as below.    Corliss Parish, MD [facility] Advanced [facility]oscopy Office # [phone]

## 2023-06-19 ENCOUNTER — Encounter: Payer: Self-pay | Admitting: Nurse Practitioner

## 2023-06-19 ENCOUNTER — Encounter: Payer: Self-pay | Admitting: General Practice

## 2023-06-19 ENCOUNTER — Ambulatory Visit: Admitting: Nurse Practitioner

## 2023-06-19 VITALS — BP 130/84 | HR 88 | Ht 64.0 in | Wt 170.8 lb

## 2023-06-19 DIAGNOSIS — C50412 Malignant neoplasm of upper-outer quadrant of left female breast: Secondary | ICD-10-CM | POA: Diagnosis not present

## 2023-06-19 DIAGNOSIS — Z720 Tobacco use: Secondary | ICD-10-CM

## 2023-06-19 DIAGNOSIS — J432 Centrilobular emphysema: Secondary | ICD-10-CM

## 2023-06-19 DIAGNOSIS — R0982 Postnasal drip: Secondary | ICD-10-CM

## 2023-06-19 DIAGNOSIS — G4733 Obstructive sleep apnea (adult) (pediatric): Secondary | ICD-10-CM | POA: Insufficient documentation

## 2023-06-19 DIAGNOSIS — Z171 Estrogen receptor negative status [ER-]: Secondary | ICD-10-CM

## 2023-06-19 MED ORDER — FLUTICASONE PROPIONATE 50 MCG/ACT NA SUSP: 1.0000 | Freq: Every day | NASAL | 2 refills | Status: AC

## 2023-06-19 MED ORDER — STIOLTO RESPIMAT 2.5-2.5 MCG/ACT IN AERS: 2.0000 | INHALATION_SPRAY | Freq: Every day | RESPIRATORY_TRACT | 11 refills | Status: AC

## 2023-06-19 NOTE — Progress Notes (Signed)
 @Patient  ID: Mary Campbell, female    DOB: [birth year], 58 y.o.   MRN: [id number]  Chief Complaint  Patient presents with   Follow-up    Albuterol  is helping     Referring provider: Roetta Clarke, NP  HPI: 58 year old female, active smoker followed for severe OSA and emphysema.  Past medical history significant for hypertension, PVD, HLD, emphysema, allergic rhinitis, neuropathy, DM on insulin .   TEST/EVENTS:  07/18/2022 LDCT chest: Atherosclerosis.  No LAD.  Multiple small pulmonary nodules bilaterally, largest of which 3.4 mm.  Lung RADS 2S.  Mild diffuse bronchial wall thickening with very mild centrilobular and paraseptal emphysema.  Right adrenal nodule, similar to prior and compatible with benign adenoma. 04/13/2023 HST: AHI 34.7/h, SpO2 low 75% 05/18/2023 PFT: FVC 67, FEV1 71, ratio 84, TLC 90, DLCOcor 62. No BD  12/22/2022: Ov with Hayley Horn NP Discussed the use of AI scribe software for clinical note transcription with the patient, who gave verbal consent to proceed. The patient presents today for sleep consult with significant daytime sleepiness and snoring. She reports waking up feeling short of breath and has experienced drowsy driving in the past, although not recently due to limited driving distances. She also reports morning headaches and dry mouth upon waking. Sleep patterns are inconsistent, with difficulty falling asleep at times, leading to daytime sleep and disrupted nocturnal sleep. She denies any sleep parasomnias/paralysis. She denies use of  sleep aids. Goes to bed around 11 PM.  Sleep onset varies.  Typically wakes at least 4 times a night.  Usually gets up around 1030 to 11 AM.  Does nap during the day.  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  Weight has been relatively stable over the last 2 years.  Never had a previous sleep study.  Does not use any supplemental oxygen. Lives with her daughter.  Works as a Manufacturing systems engineer.  Family history of emphysema, allergies, asthma,  heart disease, coagulation disorders, rheumatism and cancer. She has a history of COPD per her report, but is not currently on any medications for this condition. She experiences shortness of breath, particularly when climbing stairs or walking long distances, and uses a cane due to back and knee pain from fibromyalgia and arthritis. She also reports a cough, particularly in the morning and at night, that is productive with white phlegm. She denies any hemoptysis, fevers, chills, wheezing, leg swelling, orthopnea, PND, palpitations, anorexia.  She has a history of smoking, but quit in July. She has never had lung function testing and does not have a rescue inhaler. She undergoes annual lung cancer screening CTs, the most recent of which showed small scattered nodules and evidence of emphysema and chronic bronchitis.  [place] 20  06/19/2023: Today - follow up Discussed the use of AI scribe software for clinical note transcription with the patient, who gave verbal consent to proceed.  History of Present Illness   Mary Campbell is a 58 year old female who presents for follow up after undergoing sleep study and PFT. She is overdue for follow up due to recent breast cancer diagnosis. She is preparing to have a lumpectomy on 6/12 then will start chemo and radiation afterwards with Dr. Lee Public.   She has severe sleep apnea confirmed by a home sleep study and has not yet started CPAP therapy. Feels unchanged compared to her last visit. No drowsy driving.   She experiences shortness of breath during exertion, particularly when climbing stairs, and uses an albuterol  inhaler at least once  a day, which does seem to provide relief. She does not have a formal obstruction on PFT but does have a reduced diffusion defect and FEV1. She experiences postnasal drip but is not currently using a nasal spray, having previously used Flonase without refilling it. Occasional cough with minimal sputum production. No hemoptysis,  weight loss, anorexia.   She has recently quit smoking and has been smoke-free for a week. She is using Wellbutrin and nicotine patches to aid in smoking cessation. Smoking after starting Wellbutrin made her feel sick, which has helped her avoid smoking. She is engaging in gardening to keep her hands and mind occupied. Mood is stable.       Allergies  Allergen Reactions   Codeine Hives   Lisinopril Cough   Sulfonamide Derivatives Hives    Immunization History  Administered Date(s) Administered   Influenza-Unspecified 10/19/2020   Pneumococcal-Unspecified 10/19/2020    Past Medical History:  Diagnosis Date   Anxiety    Arthritis    Breast cancer (HCC)    left   Chronic back pain    COPD (chronic obstructive pulmonary disease) (HCC)    Diabetes mellitus    Diverticulitis    Fibromyalgia    Hyperlipidemia    Hypertension    PAD (peripheral artery disease) (HCC)    a. PV angio 10/2018 - bilateral SFA disease s/p L intervention.   PAD (peripheral artery disease) (HCC)    Sleep apnea    Tobacco abuse     Tobacco History: Social History   Tobacco Use  Smoking Status Some Days   Current packs/day: 0.00   Types: Cigarettes   Last attempt to quit: 08/11/2022   Years since quitting: 0.8  Smokeless Tobacco Never  Tobacco Comments   Trying quit with patch   Ready to quit: Not Answered Counseling given: Not Answered Tobacco comments: Trying quit with patch   Outpatient Medications Prior to Visit  Medication Sig Dispense Refill   acetaminophen  (TYLENOL ) 650 MG CR tablet Take 1,300 mg by mouth every 8 (eight) hours as needed for pain.     albuterol  (VENTOLIN  HFA) 108 (90 Base) MCG/ACT inhaler Inhale 2 puffs into the lungs every 6 (six) hours as needed for wheezing or shortness of breath. 8 g 2   amLODipine-benazepril (LOTREL) 5-20 MG capsule Take 1 capsule by mouth daily.     aspirin  EC 81 MG tablet Take 81 mg by mouth daily.     buPROPion (WELLBUTRIN XL) 150 MG 24 hr  tablet Take 150 mg by mouth daily.     clobetasol cream (TEMOVATE) 0.05 % Apply 1 Application topically 2 (two) times daily.     clopidogrel  (PLAVIX ) 75 MG tablet Take 1 tablet (75 mg total) by mouth daily. 90 tablet 1   diphenhydrAMINE  (BENADRYL ) 25 mg capsule Take 25 mg by mouth every 6 (six) hours as needed for allergies.     DULoxetine (CYMBALTA) 30 MG capsule Take 30 mg by mouth daily.     DULoxetine (CYMBALTA) 60 MG capsule Take 60 mg by mouth daily.     gabapentin (NEURONTIN) 300 MG capsule Take 300 mg by mouth 2 (two) times daily.     insulin  glargine (LANTUS ) 100 UNIT/ML injection Inject 48 Units into the skin at bedtime.     JANUMET XR 50-1000 MG TB24 Take 1 tablet by mouth in the morning and at bedtime.     nicotine (NICODERM CQ - DOSED IN MG/24 HOURS) 21 mg/24hr patch Place 1 patch onto the skin daily.  rosuvastatin  (CRESTOR ) 40 MG tablet Take 1 tablet (40 mg total) by mouth daily. 90 tablet 3   Facility-Administered Medications Prior to Visit  Medication Dose Route Frequency Provider Last Rate Last Admin   sodium chloride  flush (NS) 0.9 % injection 3 mL  3 mL Intravenous Q12H Avanell Leigh, MD       sodium chloride  flush (NS) 0.9 % injection 3 mL  3 mL Intravenous Q12H Avanell Leigh, MD         Review of Systems:   Constitutional: No weight loss or gain, night sweats, fevers, chills, or lassitude. +fatigue  HEENT: No difficulty swallowing, tooth/dental problems, or sore throat. No sneezing, itching, ear ache +nasal congestion, post nasal drip, AM headaches  CV:  No chest pain, orthopnea, PND, swelling in lower extremities, anasarca, dizziness, palpitations, syncope Resp: +snoring; shortness of breath with exertion; chronic cough. No excess mucus or change in color of mucus. No hemoptysis. No wheezing.  No chest wall deformity GI:  No heartburn, indigestion, abdominal pain, nausea, vomiting, diarrhea, change in bowel habits, loss of appetite, bloody stools.  GU: No  dysuria, change in color of urine, urgency or frequency.  No flank pain, no hematuria  Skin: No rash, lesions, ulcerations MSK:  +chronic back and joint pains  Neuro: No dizziness or lightheadedness.  Psych: +stable depression, anxiety. Mood stable. +sleep disturbance     Physical Exam:  BP 130/84 (BP Location: Right Arm, Patient Position: Sitting, Cuff Size: Normal)   Pulse 88   Ht 5\' 4"  (1.626 m)   Wt 170 lb 12.8 oz (77.5 kg)   SpO2 98%   BMI 29.32 kg/m   GEN: Pleasant, interactive, well-kempt; in no acute distress HEENT:  Normocephalic and atraumatic. PERRLA. Sclera white. Nasal turbinates pink, moist and patent bilaterally. No rhinorrhea present. Oropharynx pink and moist, without exudate or edema. No lesions, ulcerations, or postnasal drip. Mallampati IV NECK:  Supple w/ fair ROM. No JVD present. Normal carotid impulses w/o bruits. Thyroid symmetrical with no goiter or nodules palpated. No lymphadenopathy.   CV: RRR, no m/r/g, no peripheral edema. Pulses intact, +2 bilaterally. No cyanosis, pallor or clubbing. PULMONARY:  Unlabored, regular breathing. Clear bilaterally A&P w/o wheezes/rales/rhonchi. No accessory muscle use.  GI: BS present and normoactive. Soft, non-tender to palpation. No organomegaly or masses detected.  MSK: No erythema, warmth or tenderness. Cap refil <2 sec all extrem. No deformities or joint swelling noted.  Neuro: A/Ox3. No focal deficits noted.   Skin: Warm, no lesions or rashe Psych: Normal affect and behavior. Judgement and thought content appropriate.     Lab Results:  CBC    Component Value Date/Time   WBC 10.5 06/07/2023 1224   WBC 12.6 (H) 11/23/2020 1125   RBC 4.36 06/07/2023 1224   HGB 12.7 06/07/2023 1224   HGB 12.0 04/15/2020 1537   HCT 37.7 06/07/2023 1224   HCT 36.5 04/15/2020 1537   PLT 293 06/07/2023 1224   PLT 323 04/15/2020 1537   MCV 86.5 06/07/2023 1224   MCV 87 04/15/2020 1537   MCH 29.1 06/07/2023 1224   MCHC 33.7  06/07/2023 1224   RDW 15.6 (H) 06/07/2023 1224   RDW 14.4 04/15/2020 1537   LYMPHSABS 1.7 06/07/2023 1224   LYMPHSABS 2.5 10/25/2018 1552   MONOABS 0.5 06/07/2023 1224   EOSABS 0.1 06/07/2023 1224   EOSABS 0.2 10/25/2018 1552   BASOSABS 0.1 06/07/2023 1224   BASOSABS 0.1 10/25/2018 1552    BMET    Component Value  Date/Time   NA 138 06/07/2023 1224   NA 139 04/15/2020 1537   K 4.1 06/07/2023 1224   CL 103 06/07/2023 1224   CO2 30 06/07/2023 1224   GLUCOSE 186 (H) 06/07/2023 1224   BUN 9 06/07/2023 1224   BUN 9 04/15/2020 1537   CREATININE 0.96 06/07/2023 1224   CALCIUM  9.1 06/07/2023 1224   GFRNONAA >60 06/07/2023 1224   GFRAA 73 03/11/2020 1005    BNP No results found for: "BNP"   Imaging:  US  LT BREAST BX W LOC DEV 1ST LESION IMG BX SPEC US  GUIDE Addendum Date: 06/06/2023 ADDENDUM REPORT: 06/06/2023 08:25 ADDENDUM: Pathology revealed: 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 This was found to be concordant by Dr. Marvene Slipper. Pathology results were discussed with the patient by telephone. The patient reported doing well after the biopsy with tenderness at the site. Post biopsy instructions and care were reviewed and questions were answered. The patient was encouraged to call [facility] for any additional concerns. My direct phone number was provided. The patient was referred to [facility] on Jun 07, 2023. Pathology results reported by Kraig Peru, RN on 06/01/2023. Electronically Signed   By: Rinda Cheers M.D.   On: 06/06/2023 08:25   Result Date: 06/06/2023 CLINICAL DATA:  58 year old with a screening detected new highly suspicious 1.2 cm mass with associated distortion and faint calcifications in the upper LEFT breast at middle depth, 12 o'clock 1 cm from the nipple. The patient is currently  anticoagulated on Plavix  due to peripheral arterial disease. EXAM: ULTRASOUND GUIDED LEFT BREAST CORE NEEDLE BIOPSY COMPARISON:  Previous exam(s). PROCEDURE: I met with the patient and we discussed the procedure of ultrasound-guided biopsy, including benefits and alternatives. We discussed the high likelihood of a successful procedure. We discussed the risks of the procedure, including infection, bleeding, tissue injury, clip migration, and inadequate sampling. Informed written consent was given. The usual time-out protocol was performed immediately prior to the procedure. Lesion quadrant: Upper breast, 12 o'clock location. Using sterile technique with chlorhexidine as skin antisepsis, 1% lidocaine  and 1% lidocaine  with epinephrine as local anesthetic, under direct ultrasound visualization, a 12 gauge Bard Marquee core needle device placed through an 11 gauge introducer needle was used to perform biopsy of the mass in the upper breast using a lateral approach. At the conclusion of the procedure, a ribbon shaped tissue marker clip was deployed into the biopsy cavity. The patient tolerated the procedure well without apparent immediate complications. Follow up 2 view mammogram was performed in order to confirm clip placement and was dictated separately. IMPRESSION: Ultrasound guided core needle biopsy of a highly suspicious 1.2 cm mass in the upper LEFT breast at middle depth. Electronically Signed: By: Rinda Cheers M.D. On: 05/31/2023 14:45   MM CLIP PLACEMENT LEFT Result Date: 05/31/2023 CLINICAL DATA:  Confirmation of clip placement after ultrasound-guided core needle biopsy of a highly suspicious mass in the upper LEFT breast at middle depth. EXAM: 2D and 3D DIAGNOSTIC LEFT MAMMOGRAM POST ULTRASOUND BIOPSY COMPARISON:  Previous exam(s). ACR Breast Density Category b: There are scattered areas of fibroglandular density. FINDINGS: 2D and 3D full field CC and mediolateral mammographic images were obtained  following ultrasound guided biopsy of a highly suspicious mass in the upper LEFT breast at middle depth. The ribbon shaped tissue marking clip is appropriately positioned at the  posteromedial margin of the biopsied mass in the upper breast at middle depth. Expected post biopsy hemorrhage is present without evidence of hematoma. IMPRESSION: Appropriate positioning of the ribbon shaped tissue marking clip at the posteromedial margin of the biopsied mass in the upper LEFT breast at middle depth. Final Assessment: Post Procedure Mammograms for Marker Placement Electronically Signed   By: Rinda Cheers M.D.   On: 05/31/2023 14:44   MM 3D DIAGNOSTIC MAMMOGRAM UNILATERAL LEFT BREAST Result Date: 05/30/2023 CLINICAL DATA:  58 year old female presenting as a recall from screening for possible left breast mass and left breast asymmetry. EXAM: DIGITAL DIAGNOSTIC UNILATERAL LEFT MAMMOGRAM WITH TOMOSYNTHESIS AND CAD; ULTRASOUND LEFT BREAST LIMITED TECHNIQUE: Left digital diagnostic mammography and breast tomosynthesis was performed. The images were evaluated with computer-aided detection. ; Targeted ultrasound examination of the left breast was performed. COMPARISON:  Previous exam(s). ACR Breast Density Category b: There are scattered areas of fibroglandular density. FINDINGS: Mammogram: Left breast: Spot compression tomosynthesis and full true lateral and full MLO tomosynthesis views of the left breast performed demonstrating persistence of an irregular mass with spiculation in the retroareolar left breast measuring approximately 1.1 cm. The second asymmetry seen on screening mammogram in the inferior breast resolves on the additional imaging. Ultrasound: Targeted ultrasound performed in the left breast at 12 o'clock retroareolar demonstrating an irregular hypoechoic mass with indistinct margins measuring 0.9 x 1.0 x 1.2 cm. This corresponds to the mammographic finding. Targeted ultrasound of the left axilla demonstrates  normal lymph nodes. IMPRESSION: Highly suspicious mass in the left breast at 12 o'clock retroareolar measuring 1.2 cm. RECOMMENDATION: Ultrasound-guided core needle biopsy x1 of the left breast. I have discussed the findings and recommendations with the patient. If applicable, a reminder letter will be sent to the patient regarding the next appointment. BI-RADS CATEGORY  5: Highly suggestive of malignancy. Electronically Signed   By: Allena Ito M.D.   On: 05/30/2023 15:51   US  LIMITED ULTRASOUND INCLUDING AXILLA LEFT BREAST  Result Date: 05/30/2023 CLINICAL DATA:  58 year old female presenting as a recall from screening for possible left breast mass and left breast asymmetry. EXAM: DIGITAL DIAGNOSTIC UNILATERAL LEFT MAMMOGRAM WITH TOMOSYNTHESIS AND CAD; ULTRASOUND LEFT BREAST LIMITED TECHNIQUE: Left digital diagnostic mammography and breast tomosynthesis was performed. The images were evaluated with computer-aided detection. ; Targeted ultrasound examination of the left breast was performed. COMPARISON:  Previous exam(s). ACR Breast Density Category b: There are scattered areas of fibroglandular density. FINDINGS: Mammogram: Left breast: Spot compression tomosynthesis and full true lateral and full MLO tomosynthesis views of the left breast performed demonstrating persistence of an irregular mass with spiculation in the retroareolar left breast measuring approximately 1.1 cm. The second asymmetry seen on screening mammogram in the inferior breast resolves on the additional imaging. Ultrasound: Targeted ultrasound performed in the left breast at 12 o'clock retroareolar demonstrating an irregular hypoechoic mass with indistinct margins measuring 0.9 x 1.0 x 1.2 cm. This corresponds to the mammographic finding. Targeted ultrasound of the left axilla demonstrates normal lymph nodes. IMPRESSION: Highly suspicious mass in the left breast at 12 o'clock retroareolar measuring 1.2 cm. RECOMMENDATION:  Ultrasound-guided core needle biopsy x1 of the left breast. I have discussed the findings and recommendations with the patient. If applicable, a reminder letter will be sent to the patient regarding the next appointment. BI-RADS CATEGORY  5: Highly suggestive of malignancy. Electronically Signed   By: Allena Ito M.D.   On: 05/30/2023 15:51    Administration History     None  Latest Ref Rng & Units 05/18/2023    8:04 AM  PFT Results  FVC-Pre L 2.27   FVC-Predicted Pre % 67   FVC-Post L 2.27   FVC-Predicted Post % 67   Pre FEV1/FVC % % 82   Post FEV1/FCV % % 84   FEV1-Pre L 1.86   FEV1-Predicted Pre % 71   FEV1-Post L 1.91   DLCO uncorrected ml/min/mmHg 12.92   DLCO UNC% % 62   DLCO corrected ml/min/mmHg 12.92   DLCO COR %Predicted % 62   DLVA Predicted % 81   TLC L 4.65   TLC % Predicted % 90   RV % Predicted % 118     No results found for: "NITRICOXIDE"      Assessment & Plan:   Centrilobular emphysema (HCC) No formal obstruction on PFT but reduced FEV1 and moderate diffusion defect. Receives benefit from SABA use. Will trial her on scheduled bronchodilator regimen with Stiolto. Side effect profile reviewed and teachback performed. Understands role of maintenance vs rescue inhaler. Counseled to remain smoke free. Action plan in place. Remain UTD on vaccines. Trigger prevention.  Patient Instructions  Continue Albuterol  inhaler 2 puffs every 6 hours as needed for shortness of breath or wheezing. Notify if symptoms persist despite rescue inhaler/neb use. Start Stiolto 2 puffs daily. Notify if you develop any vision changes or urinary retention   Start CPAP 5-15 cmH2O, mask of choice and heated humidity, every night, minimum of 4-6 hours a night.  Change equipment as directed. Wash your tubing with warm soap and water daily, hang to dry. Wash humidifier portion weekly. Use bottled, distilled water and change daily Be aware of reduced alertness and do not  drive or operate heavy machinery if experiencing this or drowsiness.  Exercise encouraged, as tolerated. Healthy weight management discussed.  Avoid or decrease alcohol consumption and medications that make you more sleepy, if possible. Notify if persistent daytime sleepiness occurs even with consistent use of PAP therapy.  We discussed how untreated sleep apnea puts an individual at risk for cardiac arrhthymias, pulm HTN, DM, stroke and increases their risk for daytime accidents. We also briefly reviewed treatment options including weight loss, side sleeping position, oral appliance, CPAP therapy or referral to ENT for possible surgical options  Follow up in 8 weeks with Dr. Gaynell Keeler (new pt) or [name] Sharonna Vinje,NP to see how CPAP and new inhalers are working. If symptoms do not improve or worsen, please contact office for sooner follow up or seek emergency care.    Severe obstructive sleep apnea Severe OSA with AHI 34/h. Reviewed risks of untreated severe OSA and potential treatment options. Shared decision to start CPAP given severity. Educated on proper care/use of device. Risks/benefits reviewed. Order placed for auto CPAP 5-15 cmH2O, mask of choice and heated humidity. Healthy weight loss encouraged. Safe driving practices reviewed.   Malignant neoplasm of upper-outer quadrant of left breast in female, estrogen receptor negative (HCC) Awaiting lumpectomy. She will start chemo/radiation following. Follow up with oncology and surgery as scheduled   Tobacco abuse Quit smoking approx. 1 week ago. Using Wellbutrin and nicotine patches. Stable. Counseled to remain smoke free.     Advised if symptoms do not improve or worsen, to please contact office for sooner follow up or seek emergency care.   I spent 35 minutes of dedicated to the care of this patient on the date of this encounter to include pre-visit review of records, face-to-face time with the patient discussing conditions above, post visit  ordering of testing, clinical documentation with the electronic health record, making appropriate referrals as documented, and communicating necessary findings to members of the patients care team.  Roetta Clarke, NP 06/19/2023  Pt aware and understands NP's role.

## 2023-06-19 NOTE — Assessment & Plan Note (Signed)
 Quit smoking approx. 1 week ago. Using Wellbutrin and nicotine patches. Stable. Counseled to remain smoke free.

## 2023-06-19 NOTE — Assessment & Plan Note (Signed)
 No formal obstruction on PFT but reduced FEV1 and moderate diffusion defect. Receives benefit from SABA use. Will trial her on scheduled bronchodilator regimen with Stiolto. Side effect profile reviewed and teachback performed. Understands role of maintenance vs rescue inhaler. Counseled to remain smoke free. Action plan in place. Remain UTD on vaccines. Trigger prevention.  Patient Instructions  Continue Albuterol  inhaler 2 puffs every 6 hours as needed for shortness of breath or wheezing. Notify if symptoms persist despite rescue inhaler/neb use. Start Stiolto 2 puffs daily. Notify if you develop any vision changes or urinary retention   Start CPAP 5-15 cmH2O, mask of choice and heated humidity, every night, minimum of 4-6 hours a night.  Change equipment as directed. Wash your tubing with warm soap and water daily, hang to dry. Wash humidifier portion weekly. Use bottled, distilled water and change daily Be aware of reduced alertness and do not drive or operate heavy machinery if experiencing this or drowsiness.  Exercise encouraged, as tolerated. Healthy weight management discussed.  Avoid or decrease alcohol consumption and medications that make you more sleepy, if possible. Notify if persistent daytime sleepiness occurs even with consistent use of PAP therapy.  We discussed how untreated sleep apnea puts an individual at risk for cardiac arrhthymias, pulm HTN, DM, stroke and increases their risk for daytime accidents. We also briefly reviewed treatment options including weight loss, side sleeping position, oral appliance, CPAP therapy or referral to ENT for possible surgical options  Follow up in 8 weeks with Dr. Gaynell Keeler (new pt) or [name] Domonique Brouillard,NP to see how CPAP and new inhalers are working. If symptoms do not improve or worsen, please contact office for sooner follow up or seek emergency care.

## 2023-06-19 NOTE — Assessment & Plan Note (Signed)
 Awaiting lumpectomy. She will start chemo/radiation following. Follow up with oncology and surgery as scheduled

## 2023-06-19 NOTE — Progress Notes (Signed)
 [facility] Spiritual Care Note  Followed up by phone after [facility] for Spiritual Care check-in. Mary Campbell reports, "I don't know if I'm still in shock" because she hasn't experienced a lot of strong feelings related to her diagnosis. She also notes that she is very busy; we talked about how this may defer some of her processing.   Mary Campbell plans to reach out to Spiritual Care as needed, including if she decides that she would like an Sports coach.   [address] Mary Campbell, [place], [facility] Pager [phone] Voicemail [phone]

## 2023-06-19 NOTE — Patient Instructions (Signed)
 Continue Albuterol  inhaler 2 puffs every 6 hours as needed for shortness of breath or wheezing. Notify if symptoms persist despite rescue inhaler/neb use. Start Stiolto 2 puffs daily. Notify if you develop any vision changes or urinary retention   Start CPAP 5-15 cmH2O, mask of choice and heated humidity, every night, minimum of 4-6 hours a night.  Change equipment as directed. Wash your tubing with warm soap and water daily, hang to dry. Wash humidifier portion weekly. Use bottled, distilled water and change daily Be aware of reduced alertness and do not drive or operate heavy machinery if experiencing this or drowsiness.  Exercise encouraged, as tolerated. Healthy weight management discussed.  Avoid or decrease alcohol consumption and medications that make you more sleepy, if possible. Notify if persistent daytime sleepiness occurs even with consistent use of PAP therapy.  We discussed how untreated sleep apnea puts an individual at risk for cardiac arrhthymias, pulm HTN, DM, stroke and increases their risk for daytime accidents. We also briefly reviewed treatment options including weight loss, side sleeping position, oral appliance, CPAP therapy or referral to ENT for possible surgical options  Follow up in 8 weeks with Dr. Gaynell Keeler (new pt) or [name] Rayce Brahmbhatt,NP to see how CPAP and new inhalers are working. If symptoms do not improve or worsen, please contact office for sooner follow up or seek emergency care.

## 2023-06-19 NOTE — Assessment & Plan Note (Signed)
 Severe OSA with AHI 34/h. Reviewed risks of untreated severe OSA and potential treatment options. Shared decision to start CPAP given severity. Educated on proper care/use of device. Risks/benefits reviewed. Order placed for auto CPAP 5-15 cmH2O, mask of choice and heated humidity. Healthy weight loss encouraged. Safe driving practices reviewed.

## 2023-06-20 ENCOUNTER — Ambulatory Visit (HOSPITAL_COMMUNITY)
Admission: RE | Admit: 2023-06-20 | Discharge: 2023-06-20 | Disposition: A | Discharge status: Home / Self Care | Source: Ambulatory Visit | Attending: Vascular Surgery

## 2023-06-20 ENCOUNTER — Ambulatory Visit (HOSPITAL_COMMUNITY)
Admission: RE | Admit: 2023-06-20 | Discharge: 2023-06-20 | Disposition: A | Discharge status: Home / Self Care | Source: Ambulatory Visit | Attending: Vascular Surgery | Admitting: Vascular Surgery

## 2023-06-20 DIAGNOSIS — I739 Peripheral vascular disease, unspecified: Secondary | ICD-10-CM

## 2023-06-21 ENCOUNTER — Encounter: Payer: Self-pay | Admitting: *Deleted

## 2023-06-21 ENCOUNTER — Other Ambulatory Visit: Payer: Self-pay | Admitting: Physician Assistant

## 2023-06-21 DIAGNOSIS — Z87891 Personal history of nicotine dependence: Secondary | ICD-10-CM

## 2023-06-21 LAB — VAS US ABI WITH/WO TBI
Left ABI: 0.81
Right ABI: 0.82

## 2023-06-23 ENCOUNTER — Ambulatory Visit: Payer: Self-pay | Admitting: Cardiovascular Disease

## 2023-06-23 ENCOUNTER — Telehealth: Payer: Self-pay | Admitting: *Deleted

## 2023-06-23 NOTE — Telephone Encounter (Signed)
   Patient Name: Mary Campbell  DOB: [birth year] MRN: [id number]  Primary Cardiologist: Oneil Bigness, MD  Chart reviewed as part of pre-operative protocol coverage. Given past medical history and time since last visit, based on ACC/AHA guidelines, Sierra Luba is at acceptable risk for the planned procedure without further cardiovascular testing.   Per office protocol, she may hold Plavix  for 5 days prior to procedure. Please resume Plavix  as soon as possible postprocedure, at the discretion of the surgeon. Regarding ASA therapy, we recommend continuation of ASA throughout the perioperative period.    I will route this recommendation to the requesting party via Epic fax function and remove from pre-op pool.  Please call with questions.  Jude Norton, NP 06/23/2023, 9:21 AM

## 2023-06-23 NOTE — Pre-Procedure Instructions (Signed)
 Surgical Instructions   Your procedure is scheduled on June 29, 2023. Report to [facility] Main Entrance "A" at 1:00 P.M., then check in with the Admitting office. Any questions or running late day of surgery: call [phone]  Questions prior to your surgery date: call [phone], Monday-Friday, 8am-4pm. If you experience any cold or flu symptoms such as cough, fever, chills, shortness of breath, etc. between now and your scheduled surgery, please notify us  at the above number.     Remember:  Do not eat after midnight the night before your surgery   You may drink clear liquids until 12:00 PM the afternoon of your surgery.   Clear liquids allowed are: Water, Non-Citrus Juices (without pulp), Carbonated Beverages, Clear Tea (no milk, honey, etc.), Black Coffee Only (NO MILK, CREAM OR POWDERED CREAMER of any kind), and Gatorade.    Take these medicines the morning of surgery with A SIP OF WATER: acetaminophen  (TYLENOL )  buPROPion (WELLBUTRIN XL)  DULoxetine (CYMBALTA)  gabapentin (NEURONTIN)  Tiotropium Bromide-Olodaterol (STIOLTO RESPIMAT)    May take these medicines IF NEEDED: albuterol  (VENTOLIN  HFA) inhaler - please bring inhaler with you morning of surgery fluticasone (FLONASE) nasal spray    Follow your surgeon's instructions on when to stop Aspirin  and clopidogrel  (PLAVIX ).  If no instructions were given by your surgeon then you will need to call the office to get those instructions.     One week prior to surgery, STOP taking any Aleve , Naproxen , Ibuprofen, Motrin, Advil, Goody's, BC's, all herbal medications, fish oil, and non-prescription vitamins.   WHAT DO I DO ABOUT MY DIABETES MEDICATION?   Do not take JANUMET XR the morning of surgery.  THE NIGHT BEFORE SURGERY, take 25 units of LANTUS  SOLOSTAR insulin .       HOW TO MANAGE YOUR DIABETES BEFORE AND AFTER SURGERY  Why is it important to control my blood sugar before and after surgery? Improving blood  sugar levels before and after surgery helps healing and can limit problems. A way of improving blood sugar control is eating a healthy diet by:  Eating less sugar and carbohydrates  Increasing activity/exercise  Talking with your doctor about reaching your blood sugar goals High blood sugars (greater than 180 mg/dL) can raise your risk of infections and slow your recovery, so you will need to focus on controlling your diabetes during the weeks before surgery. Make sure that the doctor who takes care of your diabetes knows about your planned surgery including the date and location.  How do I manage my blood sugar before surgery? Check your blood sugar at least 4 times a day, starting 2 days before surgery, to make sure that the level is not too high or low.  Check your blood sugar the morning of your surgery when you wake up and every 2 hours until you get to the Short Stay unit.  If your blood sugar is less than 70 mg/dL, you will need to treat for low blood sugar: Do not take insulin . Treat a low blood sugar (less than 70 mg/dL) with  cup of clear juice (cranberry or apple), 4 glucose tablets, OR glucose gel. Recheck blood sugar in 15 minutes after treatment (to make sure it is greater than 70 mg/dL). If your blood sugar is not greater than 70 mg/dL on recheck, call [phone] for further instructions. Report your blood sugar to the short stay nurse when you get to Short Stay.  If you are admitted to the hospital after surgery: Your blood sugar  will be checked by the staff and you will probably be given insulin  after surgery (instead of oral diabetes medicines) to make sure you have good blood sugar levels. The goal for blood sugar control after surgery is 80-180 mg/dL.                      Do NOT Smoke (Tobacco/Vaping) for 24 hours prior to your procedure.  If you use a CPAP at night, you may bring your mask/headgear for your overnight stay.   You will be asked to remove any  contacts, glasses, piercing's, hearing aid's, dentures/partials prior to surgery. Please bring cases for these items if needed.    Patients discharged the day of surgery will not be allowed to drive home, and someone needs to stay with them for 24 hours.  SURGICAL WAITING ROOM VISITATION Patients may have no more than 2 support people in the waiting area - these visitors may rotate.   Pre-op nurse will coordinate an appropriate time for 1 ADULT support person, who may not rotate, to accompany patient in pre-op.  Children under the age of 71 must have an adult with them who is not the patient and must remain in the main waiting area with an adult.  If the patient needs to stay at the hospital during part of their recovery, the visitor guidelines for inpatient rooms apply.  Please refer to the [facility] website for the visitor guidelines for any additional information.   If you received a COVID test during your pre-op visit  it is requested that you wear a mask when out in public, stay away from anyone that may not be feeling well and notify your surgeon if you develop symptoms. If you have been in contact with anyone that has tested positive in the last 10 days please notify you surgeon.      Pre-operative CHG Bathing Instructions   You can play a key role in reducing the risk of infection after surgery. Your skin needs to be as free of germs as possible. You can reduce the number of germs on your skin by washing with CHG (chlorhexidine gluconate) soap before surgery. CHG is an antiseptic soap that kills germs and continues to kill germs even after washing.   DO NOT use if you have an allergy to chlorhexidine/CHG or antibacterial soaps. If your skin becomes reddened or irritated, stop using the CHG and notify one of our RNs at [phone].              TAKE A SHOWER THE NIGHT BEFORE SURGERY AND THE DAY OF SURGERY    Please keep in mind the following:  DO NOT shave, including legs and  underarms, 48 hours prior to surgery.   You may shave your face before/day of surgery.  Place clean sheets on your bed the night before surgery Use a clean washcloth (not used since being washed) for each shower. DO NOT sleep with pet's night before surgery.  CHG Shower Instructions:  Wash your face and private area with normal soap. If you choose to wash your hair, wash first with your normal shampoo.  After you use shampoo/soap, rinse your hair and body thoroughly to remove shampoo/soap residue.  Turn the water OFF and apply half the bottle of CHG soap to a CLEAN washcloth.  Apply CHG soap ONLY FROM YOUR NECK DOWN TO YOUR TOES (washing for 3-5 minutes)  DO NOT use CHG soap on face, private areas, open wounds,  or sores.  Pay special attention to the area where your surgery is being performed.  If you are having back surgery, having someone wash your back for you may be helpful. Wait 2 minutes after CHG soap is applied, then you may rinse off the CHG soap.  Pat dry with a clean towel  Put on clean pajamas    Additional instructions for the day of surgery: DO NOT APPLY any lotions, deodorants, cologne, or perfumes.   Do not wear jewelry or makeup Do not wear nail polish, gel polish, artificial nails, or any other type of covering on natural nails (fingers and toes) Do not bring valuables to the hospital. [facility] is not responsible for valuables/personal belongings. Put on clean/comfortable clothes.  Please brush your teeth.  Ask your nurse before applying any prescription medications to the skin.

## 2023-06-23 NOTE — Telephone Encounter (Signed)
 [facility] Pre-operative Risk Assessment     Request for surgical clearance:     Endoscopy Procedure  What type of surgery is being performed?     colonoscopy  When is this surgery scheduled?     07/26/23  What type of clearance is required ?   Pharmacy  Are there any medications that need to be held prior to surgery and how long? Plavix  5 days  Practice name and name of physician performing surgery?      [facility]  What is your office phone and fax number?      Phone- [phone]  Fax- [phone]  Anesthesia type (None, local, MAC, general) ?       MAC   Please route your response to Monita Anon, CMA

## 2023-06-26 ENCOUNTER — Encounter (HOSPITAL_COMMUNITY)
Admission: RE | Admit: 2023-06-26 | Discharge: 2023-06-26 | Disposition: A | Discharge status: Home / Self Care | Source: Ambulatory Visit | Attending: General Surgery | Admitting: General Surgery

## 2023-06-26 ENCOUNTER — Encounter (HOSPITAL_COMMUNITY): Payer: Self-pay

## 2023-06-26 ENCOUNTER — Other Ambulatory Visit: Payer: Self-pay

## 2023-06-26 VITALS — BP 145/61 | HR 76 | Temp 98.6°F | Resp 16 | Ht 64.0 in | Wt 169.3 lb

## 2023-06-26 DIAGNOSIS — Z171 Estrogen receptor negative status [ER-]: Secondary | ICD-10-CM | POA: Insufficient documentation

## 2023-06-26 DIAGNOSIS — Z01818 Encounter for other preprocedural examination: Secondary | ICD-10-CM | POA: Diagnosis present

## 2023-06-26 DIAGNOSIS — C50112 Malignant neoplasm of central portion of left female breast: Secondary | ICD-10-CM | POA: Insufficient documentation

## 2023-06-26 HISTORY — DX: Nausea with vomiting, unspecified: Z98.890

## 2023-06-26 HISTORY — DX: Dyspnea, unspecified: R06.00

## 2023-06-26 HISTORY — DX: Other specified postprocedural states: Z98.890

## 2023-06-26 HISTORY — DX: Failed or difficult intubation, initial encounter: T88.4XXA

## 2023-06-26 HISTORY — DX: Nausea with vomiting, unspecified: R11.2

## 2023-06-26 LAB — GLUCOSE, CAPILLARY: Glucose-Capillary: 244 mg/dL — ABNORMAL HIGH (ref 70–99)

## 2023-06-26 NOTE — Progress Notes (Signed)
 Surgical Instructions     Your procedure is scheduled on June 29, 2023. Report to [facility] Main Entrance "A" at 1:00 P.M., then check in with the Admitting office. Any questions or running late day of surgery: call [phone]   Questions prior to your surgery date: call [phone], Monday-Friday, 8am-4pm. If you experience any cold or flu symptoms such as cough, fever, chills, shortness of breath, etc. between now and your scheduled surgery, please notify us  at the above number.            Remember:       Do not eat after midnight the night before your surgery     You may drink clear liquids until 12:00 PM the afternoon of your surgery.   Clear liquids allowed are: Water, Non-Citrus Juices (without pulp), Carbonated Beverages, Clear Tea (no milk, honey, etc.), Black Coffee Only (NO MILK, CREAM OR POWDERED CREAMER of any kind), and Gatorade.          Take these medicines the morning of surgery with A SIP OF WATER: acetaminophen  (TYLENOL )  buPROPion (WELLBUTRIN XL)  DULoxetine (CYMBALTA)  gabapentin (NEURONTIN)  Tiotropium Bromide-Olodaterol (STIOLTO RESPIMAT )      May take these medicines IF NEEDED: albuterol  (VENTOLIN  HFA) inhaler - please bring inhaler with you morning of surgery fluticasone  (FLONASE ) nasal spray    Per your cardiologist, Clopidogrel  (Plavix ) should be held for 5 days prior to the procedure.  Your last dose of Plavix  should be on Friday, June 6th.       Follow your surgeon's instructions on when to stop Aspirin ..  If no instructions were given by your surgeon then you will need to call the office to get those instructions.       One week prior to surgery, STOP taking any Aleve , Naproxen , Ibuprofen, Motrin, Advil, Goody's, BC's, all herbal medications, fish oil, and non-prescription vitamins.     WHAT DO I DO ABOUT MY DIABETES MEDICATION?     Do not take JANUMET XR the morning of surgery.   THE NIGHT BEFORE SURGERY, take 25 units of LANTUS  SOLOSTAR  insulin .                                              HOW TO MANAGE YOUR DIABETES BEFORE AND AFTER SURGERY   Why is it important to control my blood sugar before and after surgery? Improving blood sugar levels before and after surgery helps healing and can limit problems. A way of improving blood sugar control is eating a healthy diet by:  Eating less sugar and carbohydrates  Increasing activity/exercise  Talking with your doctor about reaching your blood sugar goals High blood sugars (greater than 180 mg/dL) can raise your risk of infections and slow your recovery, so you will need to focus on controlling your diabetes during the weeks before surgery. Make sure that the doctor who takes care of your diabetes knows about your planned surgery including the date and location.   How do I manage my blood sugar before surgery? Check your blood sugar at least 4 times a day, starting 2 days before surgery, to make sure that the level is not too high or low.   Check your blood sugar the morning of your surgery when you wake up and every 2 hours until you get to the Short Stay unit.   If your blood  sugar is less than 70 mg/dL, you will need to treat for low blood sugar: Do not take insulin . Treat a low blood sugar (less than 70 mg/dL) with  cup of clear juice (cranberry or apple), 4 glucose tablets, OR glucose gel. Recheck blood sugar in 15 minutes after treatment (to make sure it is greater than 70 mg/dL). If your blood sugar is not greater than 70 mg/dL on recheck, call [phone] for further instructions. Report your blood sugar to the short stay nurse when you get to Short Stay.   If you are admitted to the hospital after surgery: Your blood sugar will be checked by the staff and you will probably be given insulin  after surgery (instead of oral diabetes medicines) to make sure you have good blood sugar levels. The goal for blood sugar control after surgery is 80-180 mg/dL.                        Do NOT Smoke (Tobacco/Vaping) for 24 hours prior to your procedure.   If you use a CPAP at night, you may bring your mask/headgear for your overnight stay.   You will be asked to remove any contacts, glasses, piercing's, hearing aid's, dentures/partials prior to surgery. Please bring cases for these items if needed.    Patients discharged the day of surgery will not be allowed to drive home, and someone needs to stay with them for 24 hours.   SURGICAL WAITING ROOM VISITATION Patients may have no more than 2 support people in the waiting area - these visitors may rotate.   Pre-op nurse will coordinate an appropriate time for 1 ADULT support person, who may not rotate, to accompany patient in pre-op.  Children under the age of 39 must have an adult with them who is not the patient and must remain in the main waiting area with an adult.   If the patient needs to stay at the hospital during part of their recovery, the visitor guidelines for inpatient rooms apply.   Please refer to the [facility] website for the visitor guidelines for any additional information.     If you received a COVID test during your pre-op visit  it is requested that you wear a mask when out in public, stay away from anyone that may not be feeling well and notify your surgeon if you develop symptoms. If you have been in contact with anyone that has tested positive in the last 10 days please notify you surgeon.         Pre-operative CHG Bathing Instructions    You can play a key role in reducing the risk of infection after surgery. Your skin needs to be as free of germs as possible. You can reduce the number of germs on your skin by washing with CHG (chlorhexidine gluconate) soap before surgery. CHG is an antiseptic soap that kills germs and continues to kill germs even after washing.    DO NOT use if you have an allergy to chlorhexidine/CHG or antibacterial soaps. If your skin becomes reddened or irritated, stop  using the CHG and notify one of our RNs at [phone].               TAKE A SHOWER THE NIGHT BEFORE SURGERY AND THE DAY OF SURGERY     Please keep in mind the following:  DO NOT shave, including legs and underarms, 48 hours prior to surgery.   You may shave your face before/day  of surgery.  Place clean sheets on your bed the night before surgery Use a clean washcloth (not used since being washed) for each shower. DO NOT sleep with pet's night before surgery.   CHG Shower Instructions:  Wash your face and private area with normal soap. If you choose to wash your hair, wash first with your normal shampoo.  After you use shampoo/soap, rinse your hair and body thoroughly to remove shampoo/soap residue.  Turn the water OFF and apply half the bottle of CHG soap to a CLEAN washcloth.  Apply CHG soap ONLY FROM YOUR NECK DOWN TO YOUR TOES (washing for 3-5 minutes)  DO NOT use CHG soap on face, private areas, open wounds, or sores.  Pay special attention to the area where your surgery is being performed.  If you are having back surgery, having someone wash your back for you may be helpful. Wait 2 minutes after CHG soap is applied, then you may rinse off the CHG soap.  Pat dry with a clean towel  Put on clean pajamas     Additional instructions for the day of surgery: DO NOT APPLY any lotions, deodorants, cologne, or perfumes.   Do not wear jewelry or makeup Do not wear nail polish, gel polish, artificial nails, or any other type of covering on natural nails (fingers and toes) Do not bring valuables to the hospital. [facility] is not responsible for valuables/personal belongings. Put on clean/comfortable clothes.  Please brush your teeth.  Ask your nurse before applying any prescription medications to the skin.

## 2023-06-26 NOTE — Progress Notes (Signed)
 Case: [id number] Date/Time: 06/29/23 1445   Procedures:      BREAST LUMPECTOMY WITH RADIOACTIVE SEED AND SENTINEL LYMPH NODE BIOPSY (Left: Breast) - LEFT BREAST RADIOACTIVE SEED LOCALIZED LUMPECTOMY   SENTINEL NODE BIOPSY     INSERTION, TUNNELED CENTRAL VENOUS DEVICE, WITH PORT - PORT PLACEMENT WITH ULTRASOUND GUIDANCE GEN w/PEC BLOCK   Anesthesia type: General   Diagnosis: Malignant neoplasm of central portion of left breast in female, estrogen receptor negative (HCC) [C50.112, Z17.1]   Pre-op diagnosis: LEFT BREAST CANCER   [facility] / [facility]   Surgeons: Caralyn Chandler, MD       DISCUSSION: Patient is a 58 year old female scheduled for the above procedure.   History includes smoking, post-operative N/V, DIFFICULT INTUBATION (2007), HTN, HLD, DM2, PAD, COPD, anxiety, fibromyalgia, exertional dyspnea, OSA (severe OSA 03/2023, no using CPAP yet), hysterectomy, left breast cancer ([facility] 05/31/23).   Regarding DIFFICULT INTUBATION history: She is unsure about any specific details of her difficult intubation history, but says she was told after a hysterectomy at [facility] in 2007. She was extubated without incident. She was not told she needed a medical alert bracelet, but was told to notify anesthesia team for future cases. She had good mouth opening and neck mobility. Mallampati is II.   She is missing most of her upper teeth but has a prominent upper incisor. She reported a loose right lower incision (#25). She was looking into teeth extractions, but put that on hold after receiving breast cancer diagnosis and making breast surgeries a priority.    She had preoperative cardiology input outlined on 06/23/23 by Marlana Silvan, NP: Given past medical history and time since last visit, based on ACC/AHA guidelines, Dovie Dancy is at acceptable risk for the planned procedure without further cardiovascular testing.    Per office protocol, she may hold Plavix  for 5 days prior to procedure. Please  resume Plavix  as soon as possible postprocedure, at the discretion of the surgeon. Regarding ASA therapy, we recommend continuation of ASA throughout the perioperative period. She denied any new CV or vascular symptoms. Last Plavix  06/23/23. Last ASA 06/26/23, and Dr. Elmo Haff staff will update her if she needs to hold prior to surgery.   She had pulmonology follow-up with Girard Lam, NP on 06/19/23. She had quit smoking a week earlier. No formal obstruction on PFT but reduced FEV1 and moderate diffusion defect. Receives benefit from SABA use. Will trial her on scheduled bronchodilator regimen with Stiolto. Continue albuterol  as needed. Advised stating CPAP 5-15 cmH20 for OSA. Follow-up in 8 weeks planned with NP or Dr. Princella Brooklyn. She noted recent breast cancer diagnosis and plans for surgery.   Requested 2007 anesthesia record from [facility] for additional details regarding difficult intubation. Mouth is on the smaller size, but good mouth opening and neck ROM. She also reported a loose tooth as above. Definitive plan per assigned anesthesiologist.   ADDENDUM 06/28/23 6:45 PM:  Received primary card records. A1c 8.1% on 06/21/23. 2007 [facility] anesthesia records not received.    VS: BP (!) 145/61   Pulse 76   Temp 37 C   Resp 16   Ht 5' 4 (1.626 m)   Wt 76.8 kg   SpO2 98%   BMI 29.06 kg/m Pulse is regular.    PROVIDERS: She receives Primary Care through [facility] [place] Albino Hum, MD is [facility] cardiologist. She had initially seen Jackquelyn Mass, MD in August 2020, but has primary followed with Dr. Katheryne Pane  since.  Girard Lam, NP is pulmonology provider Cameron Cea, MD is Rexford Catchings, MD is [facility]   LABS: She had labs at the [facility] on 06/07/23. Results included: Lab Results  Component Value Date   WBC 10.5 06/07/2023   HGB 12.7 06/07/2023   HCT 37.7 06/07/2023   PLT 293 06/07/2023   GLUCOSE 186 (H) 06/07/2023   ALT 8 06/07/2023   AST 10 (L) 06/07/2023   NA 138  06/07/2023   K 4.1 06/07/2023   CL 103 06/07/2023   CREATININE 0.96 06/07/2023   BUN 9 06/07/2023   CO2 30 06/07/2023   A1c 8.1% on 06/21/23 ([facility], scanned under Media tab, AMB Correspondence page 56 of 87).   OTHER: PFTs 05/18/23: FVC-Pre L 2.27   FVC-Predicted Pre % 67   FVC-Post L 2.27   FVC-Predicted Post % 67   Pre FEV1/FVC % % 82   Post FEV1/FCV % % 84   FEV1-Pre L 1.86   FEV1-Predicted Pre % 71   FEV1-Post L 1.91   DLCO uncorrected ml/min/mmHg 12.92   DLCO [facility]% % 62   DLCO corrected ml/min/mmHg 12.92   DLCO COR %Predicted % 62   DLVA Predicted % 81   TLC L 4.65   TLC % Predicted % 90   RV % Predicted % 118    Home Sleep Study 04/13/23: IMPRESSION: Severe obstructive sleep apnea, AHI (4%) 34.7/hr. Snoring with oxygen desatruation to a nadir of 75% with average 90%.  RECOMMENDATIONS: Suggest CPAP titration study or autopap. Other options would be based on clinical judgement.   IMAGES: CT Chest 07/18/22: IMPRESSION: 1. Lung-RADS 2S, benign appearance or behavior. Continue annual screening with low-dose chest CT without contrast in 12 months. 2. The S modifier above refers to potentially clinically significant non lung cancer related findings. Specifically, there is aortic atherosclerosis, in addition to three-vessel coronary artery disease. Please note that although the presence of coronary artery calcium  documents the presence of coronary artery disease, the severity of this disease and any potential stenosis cannot be assessed on this non-gated CT examination. Assessment for potential risk factor modification, dietary therapy or pharmacologic therapy may be warranted, if clinically indicated. 3. Mild diffuse bronchial wall thickening with mild centrilobular and paraseptal emphysema; imaging findings suggestive of underlying COPD. - Aortic Atherosclerosis (ICD10-I70.0) and Emphysema (ICD10-J43.9).    EKG:  EKG 12/19/22:   Normal sinus rhythm Septal infarct , age undetermined When compared with ECG of 23-Nov-2020 11:15, PREVIOUS ECG IS PRESENT Confirmed by Lawana Pray [phone]) on 12/19/2022 3:26:20 PM   CV: Nuclear stress test 02/01/23:   The study is normal. The study is low risk.   No ST deviation was noted.   LV perfusion is normal. There is no evidence of ischemia. There is no evidence of infarction.   Left ventricular function is normal. Nuclear stress EF: 59%. The left ventricular ejection fraction is normal (55-65%). End diastolic cavity size is normal. End systolic cavity size is normal. No evidence of transient ischemic dilation (TID) noted.   Prior study available for comparison. No changes compared to prior study.     US  Carotid 11/06/20: Summary:  - Right Carotid: Velocities in the right ICA are consistent with a 1-39% stenosis. Non-hemodynamically significant plaque <50% noted in the CCA.  - Left Carotid: Velocities in the left ICA are consistent with a 1-39%  stenosis. Non-hemodynamically significant plaque <50% noted in the CCA.  - Vertebrals: Bilateral vertebral arteries demonstrate antegrade flow.  -  Subclavians: Normal flow hemodynamics were seen in bilateral subclavian arteries.    Echo 09/19/18: IMPRESSIONS   1. There is moderate to severe thickening of the the right ventricular  wall. There is a prominent anterior pericardial fat pat so this  observation of the RV possibly represents prominent epicardial fat. There  is no reduced RV function or dilation to  suggest a diagnosis of ARVD. Would recommend a cardiac MR for further  characterization.   2. The left ventricle has hyperdynamic systolic function, with an  ejection fraction of >65%. The cavity size was normal. Left ventricular  diastolic Doppler parameters are consistent with impaired relaxation. No  evidence of left ventricular regional wall   motion abnormalities.   3. The right ventricle has normal systolic function.  The cavity was  normal. There is moderately increased right ventricular wall thickness.   4. Trivial pericardial effusion is present.   5. The mitral valve is grossly normal.   6. The tricuspid valve is grossly normal.   7. The aortic valve is tricuspid. Mild thickening of the aortic valve. No  stenosis of the aortic valve.   8. The aorta is normal unless otherwise noted.   9. The aortic root and ascending aorta are normal in size and structure.  10. Prominent crista terminalis present.  11. When compared to the prior study: No prior.    Past Medical History:  Diagnosis Date   Anxiety    Arthritis    Breast cancer (HCC)    left   Chronic back pain    COPD (chronic obstructive pulmonary disease) (HCC)    Diabetes mellitus    Difficult intubation    Diverticulitis    Dyspnea    with exertion   Fibromyalgia    Hyperlipidemia    Hypertension    PAD (peripheral artery disease) (HCC)    a. PV angio 10/2018 - bilateral SFA disease s/p L intervention.   PAD (peripheral artery disease) (HCC)    PONV (postoperative nausea and vomiting)    Sleep apnea    Tobacco abuse     Past Surgical History:  Procedure Laterality Date   ABDOMINAL AORTOGRAM W/LOWER EXTREMITY Right 10/18/2018   Procedure: ABDOMINAL AORTOGRAM W/LOWER EXTREMITY;  Surgeon: Avanell Leigh, MD;  [facility] IN[facility] CV [facility];  Service: Cardiovascular;  Laterality: Right;   ABDOMINAL AORTOGRAM W/LOWER EXTREMITY N/A 04/02/2020   Procedure: ABDOMINAL AORTOGRAM W/LOWER EXTREMITY;  Surgeon: Avanell Leigh, MD;  [facility];  Service: Cardiovascular;  Laterality: N/A;   ABDOMINAL AORTOGRAM W/LOWER EXTREMITY N/A 04/23/2020   Procedure: ABDOMINAL AORTOGRAM W/LOWER EXTREMITY;  Surgeon: Avanell Leigh, MD;  [facility];  Service: Cardiovascular;  Laterality: N/A;   ABDOMINAL HYSTERECTOMY     BREAST BIOPSY Left 05/31/2023   US  LT BREAST BX W LOC DEV 1ST LESION IMG BX SPEC US  GUIDE 05/31/2023  GI-BCG MAMMOGRAPHY   CHOLECYSTECTOMY     PERIPHERAL VASCULAR ATHERECTOMY Left 10/18/2018   Procedure: PERIPHERAL VASCULAR ATHERECTOMY;  Surgeon: Avanell Leigh, MD;  [facility];  Service: Cardiovascular;  Laterality: Left;  SFA   PERIPHERAL VASCULAR ATHERECTOMY Right 11/22/2018   Procedure: PERIPHERAL VASCULAR ATHERECTOMY;  Surgeon: Avanell Leigh, MD;  Location: [facility] INVASIVE CV LAB;  Service: Cardiovascular;  Laterality: Right;  SFA   PERIPHERAL VASCULAR ATHERECTOMY  04/23/2020   Procedure: PERIPHERAL VASCULAR ATHERECTOMY;  Surgeon: Avanell Leigh, MD;  Location: [facility] INVASIVE CV LAB;  Service: Cardiovascular;;   PERIPHERAL VASCULAR  BALLOON ANGIOPLASTY Right 11/22/2018   Procedure: PERIPHERAL VASCULAR BALLOON ANGIOPLASTY;  Surgeon: Avanell Leigh, MD;  [facility];  Service: Cardiovascular;  Laterality: Right;  SFA   PERIPHERAL VASCULAR BALLOON ANGIOPLASTY  04/23/2020   Procedure: PERIPHERAL VASCULAR BALLOON ANGIOPLASTY;  Surgeon: Avanell Leigh, MD;  [facility];  Service: Cardiovascular;;   PERIPHERAL VASCULAR INTERVENTION Left 04/02/2020   Procedure: PERIPHERAL VASCULAR INTERVENTION;  Surgeon: Avanell Leigh, MD;  [facility];  Service: Cardiovascular;  Laterality: Left;    MEDICATIONS:  acetaminophen  (TYLENOL ) 650 MG CR tablet   albuterol  (VENTOLIN  HFA) 108 (90 Base) MCG/ACT inhaler   amLODipine-benazepril (LOTREL) 5-20 MG capsule   aspirin  EC 81 MG tablet   buPROPion (WELLBUTRIN XL) 150 MG 24 hr tablet   clobetasol cream (TEMOVATE) 0.05 %   clopidogrel  (PLAVIX ) 75 MG tablet   DULoxetine (CYMBALTA) 30 MG capsule   DULoxetine (CYMBALTA) 60 MG capsule   fluticasone  (FLONASE ) 50 MCG/ACT nasal spray   gabapentin (NEURONTIN) 100 MG capsule   JANUMET XR 50-1000 MG TB24   LANTUS  SOLOSTAR 100 UNIT/ML Solostar Pen   MAGNESIUM GLYCINATE PO   nicotine (NICODERM CQ - DOSED IN MG/24 HOURS) 21 mg/24hr patch    rosuvastatin  (CRESTOR ) 40 MG tablet   Tiotropium Bromide-Olodaterol (STIOLTO RESPIMAT ) 2.5-2.5 MCG/ACT AERS    sodium chloride  flush (NS) 0.9 % injection 3 mL   sodium chloride  flush (NS) 0.9 % injection 3 mL    Ella Gun, PA-C Surgical Short Stay/Anesthesiology [facility] Phone [phone] [facility] Phone [phone] 06/26/2023 6:49 PM

## 2023-06-26 NOTE — Progress Notes (Signed)
 PCP - [facility] Cardiologist - Dr. Jackquelyn Mass and Dr. Lauro Portal Pulmonologist - Girard Lam  PPM/ICD - denies    Chest x-ray - denies EKG - 12/19/22 Stress Test - 02/01/23 ECHO - 09/19/18 Cardiac Cath - denies   Sleep Study - OSA+ but does not wear CPAP   Fasting Blood Sugar - 140-150 Checks Blood Sugar ___1__ times a day  Last dose of GLP1 agonist-  n/a GLP1 instructions:  n/a  Blood Thinner Instructions: patient instructed to hold Plavix  for 5 days prior to surgery - last dose was Friday, June 6th Aspirin  Instructions: during PAT appointment, called office to verify Aspirin  instructions.  Office stated they would follow-up with patient.  Patient instructed to continue Aspirin  unless the office tells her otherwise.   ERAS Protcol - clears until 1200 PRE-SURGERY Ensure or G2- n/a  COVID TEST- no   Anesthesia review: yes - difficult airway, cardiac clearance, breast seed   -seen by anesthesia at PAT appointment.    Patient denies shortness of breath, fever, cough and chest pain at PAT appointment   All instructions explained to the patient, with a verbal understanding of the material. Patient agrees to go over the instructions while at home for a better understanding. Patient also instructed to self quarantine after being tested for COVID-19. The opportunity to ask questions was provided.

## 2023-06-27 ENCOUNTER — Ambulatory Visit
Admission: RE | Admit: 2023-06-27 | Discharge: 2023-06-27 | Disposition: A | Discharge status: Home / Self Care | Source: Ambulatory Visit | Attending: General Surgery | Admitting: General Surgery

## 2023-06-27 DIAGNOSIS — Z171 Estrogen receptor negative status [ER-]: Secondary | ICD-10-CM

## 2023-06-27 HISTORY — PX: BREAST BIOPSY: SHX20

## 2023-06-27 NOTE — Anesthesia Preprocedure Evaluation (Signed)
 Anesthesia Evaluation  Patient identified by MRN, date of birth, ID band Patient awake    Reviewed: Allergy & Precautions, NPO status , Patient's Chart, lab work & pertinent test results  History of Anesthesia Complications (+) PONV, DIFFICULT AIRWAY and history of anesthetic complications  Airway Mallampati: III  TM Distance: >3 FB Neck ROM: Full    Dental  (+) Dental Advisory Given, Missing, Loose,    Pulmonary sleep apnea (recent diagnosis, not yet on CPAP) , COPD,  COPD inhaler, Current SmokerPatient did not abstain from smoking.   Pulmonary exam normal        Cardiovascular hypertension, Pt. on medications + Peripheral Vascular Disease  Normal cardiovascular exam     Neuro/Psych  PSYCHIATRIC DISORDERS Anxiety     negative neurological ROS     GI/Hepatic negative GI ROS, Neg liver ROS,,,  Endo/Other  diabetes, Type 2, Oral Hypoglycemic Agents, Insulin  Dependent    Renal/GU negative Renal ROS     Musculoskeletal  (+) Arthritis ,  Fibromyalgia -  Abdominal   Peds  Hematology  Plavix     Anesthesia Other Findings   Reproductive/Obstetrics  Breast cancer                              Anesthesia Physical Anesthesia Plan  ASA: 3  Anesthesia Plan: General   Post-op Pain Management: Regional block* and Ofirmev  IV (intra-op)*   Induction: Intravenous  PONV Risk Score and Plan: 4 or greater and Treatment may vary due to age or medical condition, Ondansetron , Dexamethasone and Midazolam   Airway Management Planned: LMA  Additional Equipment: None  Intra-op Plan:   Post-operative Plan: Extubation in OR  Informed Consent: I have reviewed the patients History and Physical, chart, labs and discussed the procedure including the risks, benefits and alternatives for the proposed anesthesia with the patient or authorized representative who has indicated his/her understanding and  acceptance.     Dental advisory given  Plan Discussed with: CRNA and Anesthesiologist  Anesthesia Plan Comments: (PAT note written 06/26/2023 by Jeremy Mclamb, PA-C.  )       Anesthesia Quick Evaluation

## 2023-06-28 NOTE — Progress Notes (Signed)
 Patient was called to be informed that the surgery time for tomorrow was changed to 14:30 o'clock. Patient was instructed to be at the hospital at 12:30 o'clock and to stop drinking clear liquids at 11:30 o'clock. Patient verbalized understanding.

## 2023-06-29 ENCOUNTER — Encounter (HOSPITAL_COMMUNITY): Payer: Self-pay

## 2023-06-29 ENCOUNTER — Ambulatory Visit (HOSPITAL_BASED_OUTPATIENT_CLINIC_OR_DEPARTMENT_OTHER): Payer: Self-pay | Admitting: Vascular Surgery

## 2023-06-29 ENCOUNTER — Ambulatory Visit (HOSPITAL_COMMUNITY): Payer: Self-pay | Admitting: Vascular Surgery

## 2023-06-29 ENCOUNTER — Telehealth: Payer: Self-pay | Admitting: *Deleted

## 2023-06-29 ENCOUNTER — Encounter (HOSPITAL_COMMUNITY)
Admission: RE | Disposition: A | Discharge status: Home / Self Care | Payer: Self-pay | Source: Home / Self Care | Attending: General Surgery

## 2023-06-29 ENCOUNTER — Ambulatory Visit (HOSPITAL_COMMUNITY)
Admission: RE | Admit: 2023-06-29 | Discharge: 2023-06-29 | Disposition: A | Discharge status: Home / Self Care | Attending: General Surgery | Admitting: General Surgery

## 2023-06-29 ENCOUNTER — Ambulatory Visit (HOSPITAL_COMMUNITY)

## 2023-06-29 ENCOUNTER — Ambulatory Visit
Admission: RE | Admit: 2023-06-29 | Discharge: 2023-06-29 | Disposition: A | Discharge status: Home / Self Care | Source: Ambulatory Visit | Attending: General Surgery | Admitting: General Surgery

## 2023-06-29 ENCOUNTER — Ambulatory Visit (HOSPITAL_COMMUNITY)
Admission: RE | Admit: 2023-06-29 | Discharge: 2023-06-29 | Disposition: A | Discharge status: Home / Self Care | Source: Ambulatory Visit | Attending: General Surgery | Admitting: General Surgery

## 2023-06-29 DIAGNOSIS — J449 Chronic obstructive pulmonary disease, unspecified: Secondary | ICD-10-CM | POA: Diagnosis not present

## 2023-06-29 DIAGNOSIS — F17210 Nicotine dependence, cigarettes, uncomplicated: Secondary | ICD-10-CM | POA: Insufficient documentation

## 2023-06-29 DIAGNOSIS — G4730 Sleep apnea, unspecified: Secondary | ICD-10-CM | POA: Insufficient documentation

## 2023-06-29 DIAGNOSIS — E1151 Type 2 diabetes mellitus with diabetic peripheral angiopathy without gangrene: Secondary | ICD-10-CM | POA: Insufficient documentation

## 2023-06-29 DIAGNOSIS — Z171 Estrogen receptor negative status [ER-]: Secondary | ICD-10-CM

## 2023-06-29 DIAGNOSIS — Z7984 Long term (current) use of oral hypoglycemic drugs: Secondary | ICD-10-CM | POA: Diagnosis not present

## 2023-06-29 DIAGNOSIS — Z01818 Encounter for other preprocedural examination: Secondary | ICD-10-CM

## 2023-06-29 DIAGNOSIS — C50112 Malignant neoplasm of central portion of left female breast: Secondary | ICD-10-CM | POA: Diagnosis present

## 2023-06-29 DIAGNOSIS — I10 Essential (primary) hypertension: Secondary | ICD-10-CM | POA: Insufficient documentation

## 2023-06-29 DIAGNOSIS — Z794 Long term (current) use of insulin: Secondary | ICD-10-CM | POA: Insufficient documentation

## 2023-06-29 DIAGNOSIS — Z1722 Progesterone receptor negative status: Secondary | ICD-10-CM | POA: Diagnosis not present

## 2023-06-29 DIAGNOSIS — Z1731 Human epidermal growth factor receptor 2 positive status: Secondary | ICD-10-CM | POA: Diagnosis not present

## 2023-06-29 DIAGNOSIS — C50912 Malignant neoplasm of unspecified site of left female breast: Secondary | ICD-10-CM | POA: Diagnosis not present

## 2023-06-29 HISTORY — PX: PORTACATH PLACEMENT: SHX2246

## 2023-06-29 HISTORY — PX: BREAST LUMPECTOMY WITH RADIOACTIVE SEED AND SENTINEL LYMPH NODE BIOPSY: SHX6550

## 2023-06-29 LAB — GLUCOSE, CAPILLARY
Glucose-Capillary: 160 mg/dL — ABNORMAL HIGH (ref 70–99)
Glucose-Capillary: 120 mg/dL — ABNORMAL HIGH (ref 70–99)

## 2023-06-29 SURGERY — BREAST LUMPECTOMY WITH RADIOACTIVE SEED AND SENTINEL LYMPH NODE BIOPSY
Anesthesia: Regional | Site: Breast

## 2023-06-29 MED ORDER — PHENYLEPHRINE HCL-NACL 20-0.9 MG/250ML-% IV SOLN
INTRAVENOUS | Status: DC | PRN
  Administered 2023-06-29: 25 ug/min via INTRAVENOUS

## 2023-06-29 MED ORDER — OXYCODONE HCL 5 MG PO TABS: 5.0000 mg | ORAL_TABLET | Freq: Once | ORAL | Status: DC | PRN

## 2023-06-29 MED ORDER — FENTANYL CITRATE (PF) 100 MCG/2ML IJ SOLN: 25.0000 ug | INTRAMUSCULAR | Status: DC | PRN

## 2023-06-29 MED ORDER — PROPOFOL 10 MG/ML IV BOLUS
INTRAVENOUS | Status: AC
  Filled 2023-06-29: qty 20

## 2023-06-29 MED ORDER — PROPOFOL 10 MG/ML IV BOLUS
INTRAVENOUS | Status: DC | PRN
  Administered 2023-06-29: 200 mg via INTRAVENOUS

## 2023-06-29 MED ORDER — CEFAZOLIN SODIUM-DEXTROSE 2-4 GM/100ML-% IV SOLN
2.0000 g | INTRAVENOUS | Status: AC
  Administered 2023-06-29: 2 g via INTRAVENOUS
  Filled 2023-06-29: qty 100

## 2023-06-29 MED ORDER — MAGTRACE LYMPHATIC TRACER
INTRAMUSCULAR | Status: DC | PRN
  Administered 2023-06-29: 2 mL via INTRAMUSCULAR

## 2023-06-29 MED ORDER — FENTANYL CITRATE PF 50 MCG/ML IJ SOSY
50.0000 ug | PREFILLED_SYRINGE | Freq: Once | INTRAMUSCULAR | Status: AC
  Administered 2023-06-29: 50 ug via INTRAVENOUS

## 2023-06-29 MED ORDER — BUPIVACAINE HCL (PF) 0.5 % IJ SOLN
INTRAMUSCULAR | Status: DC | PRN
  Administered 2023-06-29: 15 mL

## 2023-06-29 MED ORDER — SODIUM CHLORIDE 0.9 % IV SOLN: 12.5000 mg | INTRAVENOUS | Status: DC | PRN

## 2023-06-29 MED ORDER — AMISULPRIDE (ANTIEMETIC) 5 MG/2ML IV SOLN: 10.0000 mg | Freq: Once | INTRAVENOUS | Status: DC | PRN

## 2023-06-29 MED ORDER — LIDOCAINE 2% (20 MG/ML) 5 ML SYRINGE
INTRAMUSCULAR | Status: DC | PRN
  Administered 2023-06-29: 40 mg via INTRAVENOUS

## 2023-06-29 MED ORDER — GABAPENTIN 100 MG PO CAPS
100.0000 mg | ORAL_CAPSULE | ORAL | Status: DC
  Filled 2023-06-29: qty 1

## 2023-06-29 MED ORDER — PHENYLEPHRINE HCL (PRESSORS) 10 MG/ML IV SOLN
INTRAVENOUS | Status: AC
Start: 2023-06-29 — End: 2023-06-29
  Filled 2023-06-29: qty 1

## 2023-06-29 MED ORDER — INSULIN ASPART 100 UNIT/ML IJ SOLN
0.0000 [IU] | INTRAMUSCULAR | Status: DC | PRN
  Administered 2023-06-29: 2 [IU] via SUBCUTANEOUS
  Filled 2023-06-29: qty 1

## 2023-06-29 MED ORDER — EPHEDRINE SULFATE-NACL 50-0.9 MG/10ML-% IV SOSY
PREFILLED_SYRINGE | INTRAVENOUS | Status: DC | PRN
  Administered 2023-06-29: 10 mg via INTRAVENOUS

## 2023-06-29 MED ORDER — CHLORHEXIDINE GLUCONATE CLOTH 2 % EX PADS: 6.0000 | MEDICATED_PAD | Freq: Once | CUTANEOUS | Status: DC

## 2023-06-29 MED ORDER — ONDANSETRON HCL 4 MG/2ML IJ SOLN
INTRAMUSCULAR | Status: DC | PRN
  Administered 2023-06-29: 4 mg via INTRAVENOUS

## 2023-06-29 MED ORDER — BUPIVACAINE-EPINEPHRINE (PF) 0.25% -1:200000 IJ SOLN
INTRAMUSCULAR | Status: AC
  Filled 2023-06-29: qty 30

## 2023-06-29 MED ORDER — HEPARIN 6000 UNIT IRRIGATION SOLUTION
Status: DC | PRN
Start: 2023-06-29 — End: 2023-06-29
  Administered 2023-06-29: 1

## 2023-06-29 MED ORDER — BUPIVACAINE HCL (PF) 0.25 % IJ SOLN
INTRAMUSCULAR | Status: AC
  Filled 2023-06-29: qty 30

## 2023-06-29 MED ORDER — OXYCODONE HCL 5 MG/5ML PO SOLN: 5.0000 mg | Freq: Once | ORAL | Status: DC | PRN

## 2023-06-29 MED ORDER — DEXMEDETOMIDINE HCL IN NACL 80 MCG/20ML IV SOLN
INTRAVENOUS | Status: AC
  Filled 2023-06-29: qty 20

## 2023-06-29 MED ORDER — EPHEDRINE 5 MG/ML INJ
INTRAVENOUS | Status: AC
  Filled 2023-06-29: qty 5

## 2023-06-29 MED ORDER — BUPIVACAINE LIPOSOME 1.3 % IJ SUSP
INTRAMUSCULAR | Status: AC
  Filled 2023-06-29: qty 10

## 2023-06-29 MED ORDER — METHYLENE BLUE (ANTIDOTE) 1 % IV SOLN
INTRAVENOUS | Status: AC
  Filled 2023-06-29: qty 10

## 2023-06-29 MED ORDER — TRAMADOL HCL 50 MG PO TABS: 50.0000 mg | ORAL_TABLET | Freq: Four times a day (QID) | ORAL | 0 refills | Status: DC | PRN

## 2023-06-29 MED ORDER — FENTANYL CITRATE (PF) 100 MCG/2ML IJ SOLN
INTRAMUSCULAR | Status: AC
  Filled 2023-06-29: qty 2

## 2023-06-29 MED ORDER — HEPARIN 6000 UNIT IRRIGATION SOLUTION
Status: AC
  Filled 2023-06-29: qty 500

## 2023-06-29 MED ORDER — ORAL CARE MOUTH RINSE
15.0000 mL | Freq: Once | OROMUCOSAL | Status: AC
Start: 2023-06-29 — End: 2023-06-29

## 2023-06-29 MED ORDER — BUPIVACAINE LIPOSOME 1.3 % IJ SUSP
INTRAMUSCULAR | Status: DC | PRN
  Administered 2023-06-29: 10 mL

## 2023-06-29 MED ORDER — HEPARIN SOD (PORK) LOCK FLUSH 100 UNIT/ML IV SOLN
INTRAVENOUS | Status: DC | PRN
  Administered 2023-06-29: 500 [IU] via INTRAVENOUS

## 2023-06-29 MED ORDER — ACETAMINOPHEN 500 MG PO TABS
1000.0000 mg | ORAL_TABLET | ORAL | Status: DC
  Filled 2023-06-29: qty 2

## 2023-06-29 MED ORDER — LACTATED RINGERS IV SOLN: INTRAVENOUS | Status: DC

## 2023-06-29 MED ORDER — HEPARIN SOD (PORK) LOCK FLUSH 100 UNIT/ML IV SOLN
INTRAVENOUS | Status: AC
  Filled 2023-06-29: qty 5

## 2023-06-29 MED ORDER — ONDANSETRON HCL 4 MG/2ML IJ SOLN
INTRAMUSCULAR | Status: AC
  Filled 2023-06-29: qty 2

## 2023-06-29 MED ORDER — 0.9 % SODIUM CHLORIDE (POUR BTL) OPTIME
TOPICAL | Status: DC | PRN
Start: 2023-06-29 — End: 2023-06-29
  Administered 2023-06-29: 1000 mL

## 2023-06-29 MED ORDER — CHLORHEXIDINE GLUCONATE 0.12 % MT SOLN
15.0000 mL | Freq: Once | OROMUCOSAL | Status: AC
  Administered 2023-06-29: 15 mL via OROMUCOSAL
  Filled 2023-06-29: qty 15

## 2023-06-29 MED ORDER — BUPIVACAINE-EPINEPHRINE 0.25% -1:200000 IJ SOLN
INTRAMUSCULAR | Status: DC | PRN
  Administered 2023-06-29: 25 mL

## 2023-06-29 MED ORDER — MIDAZOLAM HCL 2 MG/2ML IJ SOLN: 1.0000 mg | Freq: Once | INTRAMUSCULAR | Status: AC

## 2023-06-29 MED ORDER — MIDAZOLAM HCL 2 MG/2ML IJ SOLN
INTRAMUSCULAR | Status: AC
  Administered 2023-06-29: 1 mg via INTRAVENOUS
  Filled 2023-06-29: qty 2

## 2023-06-29 SURGICAL SUPPLY — 52 items
APPLICATOR CHLORAPREP 10.5 ORG (MISCELLANEOUS) ×2 IMPLANT
BAG COUNTER SPONGE SURGICOUNT (BAG) ×2 IMPLANT
BAG DECANTER FOR FLEXI CONT (MISCELLANEOUS) ×2 IMPLANT
BINDER BREAST LRG (GAUZE/BANDAGES/DRESSINGS) IMPLANT
BINDER BREAST XLRG (GAUZE/BANDAGES/DRESSINGS) IMPLANT
CANISTER SUCTION 3000ML PPV (SUCTIONS) ×2 IMPLANT
CHLORAPREP W/TINT 26 (MISCELLANEOUS) ×2 IMPLANT
CLIP APPLIE 9.375 MED OPEN (MISCELLANEOUS) ×2 IMPLANT
CNTNR URN SCR LID CUP LEK RST (MISCELLANEOUS) ×2 IMPLANT
COVER PROBE W GEL 5X96 (DRAPES) ×4 IMPLANT
COVER SURGICAL LIGHT HANDLE (MISCELLANEOUS) ×2 IMPLANT
COVER TRANSDUCER ULTRASND GEL (DISPOSABLE) IMPLANT
DERMABOND ADVANCED .7 DNX12 (GAUZE/BANDAGES/DRESSINGS) ×2 IMPLANT
DEVICE DUBIN SPECIMEN MAMMOGRA (MISCELLANEOUS) ×2 IMPLANT
DRAPE C-ARM 42X120 X-RAY (DRAPES) ×2 IMPLANT
DRAPE CHEST BREAST 15X10 FENES (DRAPES) ×2 IMPLANT
ELECT CAUTERY BLADE 6.4 (BLADE) ×2 IMPLANT
ELECT COATED BLADE 2.86 ST (ELECTRODE) ×2 IMPLANT
ELECTRODE REM PT RTRN 9FT ADLT (ELECTROSURGICAL) ×2 IMPLANT
GEL ULTRASOUND 20GR AQUASONIC (MISCELLANEOUS) IMPLANT
GLOVE BIO SURGEON STRL SZ7.5 (GLOVE) ×4 IMPLANT
GOWN STRL REUS W/ TWL LRG LVL3 (GOWN DISPOSABLE) ×4 IMPLANT
KIT BASIN OR (CUSTOM PROCEDURE TRAY) ×2 IMPLANT
KIT MARKER MARGIN INK (KITS) ×2 IMPLANT
KIT PORT POWER 8FR ISP CVUE (Port) IMPLANT
KIT TURNOVER KIT B (KITS) ×2 IMPLANT
LIGHT WAVEGUIDE WIDE FLAT (MISCELLANEOUS) IMPLANT
NDL 18GX1X1/2 (RX/OR ONLY) (NEEDLE) IMPLANT
NDL 22X1.5 STRL (OR ONLY) (MISCELLANEOUS) IMPLANT
NDL FILTER BLUNT 18X1 1/2 (NEEDLE) IMPLANT
NDL HYPO 25GX1X1/2 BEV (NEEDLE) ×2 IMPLANT
NEEDLE 18GX1X1/2 (RX/OR ONLY) (NEEDLE) IMPLANT
NEEDLE 22X1.5 STRL (OR ONLY) (MISCELLANEOUS) IMPLANT
NEEDLE FILTER BLUNT 18X1 1/2 (NEEDLE) IMPLANT
NEEDLE HYPO 25GX1X1/2 BEV (NEEDLE) ×2 IMPLANT
NS IRRIG 1000ML POUR BTL (IV SOLUTION) ×2 IMPLANT
PACK GENERAL/GYN (CUSTOM PROCEDURE TRAY) ×2 IMPLANT
PAD ARMBOARD POSITIONER FOAM (MISCELLANEOUS) ×2 IMPLANT
PENCIL BUTTON HOLSTER BLD 10FT (ELECTRODE) ×2 IMPLANT
POSITIONER HEAD DONUT 9IN (MISCELLANEOUS) ×2 IMPLANT
SHEATH COOK PEEL AWAY SET 9F (SHEATH) IMPLANT
SPIKE FLUID TRANSFER (MISCELLANEOUS) ×2 IMPLANT
SUT MNCRL AB 4-0 PS2 18 (SUTURE) ×4 IMPLANT
SUT PROLENE 2 0 SH 30 (SUTURE) ×2 IMPLANT
SUT VIC AB 3-0 SH 18 (SUTURE) ×2 IMPLANT
SUT VIC AB 3-0 SH 27XBRD (SUTURE) ×2 IMPLANT
SYR 10ML LL (SYRINGE) IMPLANT
SYR 5ML LUER SLIP (SYRINGE) ×2 IMPLANT
SYR CONTROL 10ML LL (SYRINGE) ×2 IMPLANT
TOWEL GREEN STERILE (TOWEL DISPOSABLE) ×2 IMPLANT
TOWEL GREEN STERILE FF (TOWEL DISPOSABLE) ×2 IMPLANT
TRAY LAPAROSCOPIC MC (CUSTOM PROCEDURE TRAY) ×2 IMPLANT

## 2023-06-29 NOTE — Interval H&P Note (Signed)
 History and Physical Interval Note:  06/29/2023 1:24 PM  Mary Campbell  has presented today for surgery, with the diagnosis of LEFT BREAST CANCER.  The various methods of treatment have been discussed with the patient and family. After consideration of risks, benefits and other options for treatment, the patient has consented to  Procedure(s) with comments: BREAST LUMPECTOMY WITH RADIOACTIVE SEED AND SENTINEL LYMPH NODE BIOPSY (Left) - LEFT BREAST RADIOACTIVE SEED LOCALIZED LUMPECTOMY   SENTINEL NODE BIOPSY INSERTION, TUNNELED CENTRAL VENOUS DEVICE, WITH PORT (N/A) - PORT PLACEMENT WITH ULTRASOUND GUIDANCE GEN w/PEC BLOCK as a surgical intervention.  The patient's history has been reviewed, patient examined, no change in status, stable for surgery.  I have reviewed the patient's chart and labs.  Questions were answered to the patient's satisfaction.     Lillette Reid III

## 2023-06-29 NOTE — Telephone Encounter (Signed)
 Sent Brad a message in regards to this. Will inform as soon as I hear back

## 2023-06-29 NOTE — Anesthesia Procedure Notes (Signed)
 Anesthesia Regional Block: Pectoralis block   Pre-Anesthetic Checklist: , timeout performed,  Correct Patient, Correct Site, Correct Laterality,  Correct Procedure, Correct Position, site marked,  Risks and benefits discussed,  Surgical consent,  Pre-op evaluation,  At surgeon's request and post-op pain management  Laterality: Left  Prep: chloraprep       Needles:  Injection technique: Single-shot  Needle Type: Echogenic Needle     Needle Length: 10cm  Needle Gauge: 21     Additional Needles:   Narrative:  Start time: 06/29/2023 2:06 PM End time: 06/29/2023 2:09 PM Injection made incrementally with aspirations every 5 mL.  Performed by: Personally  Anesthesiologist: Juventino Oppenheim, MD  Additional Notes: No pain on injection. No increased resistance to injection. Injection made in 5cc increments. Good needle visualization. Patient tolerated the procedure well.

## 2023-06-29 NOTE — Anesthesia Procedure Notes (Signed)
 Procedure Name: LMA Insertion Date/Time: 06/29/2023 2:24 PM  Performed by: Katrinka Parr, CRNAPre-anesthesia Checklist: Patient identified, Emergency Drugs available, Suction available and Patient being monitored Patient Re-evaluated:Patient Re-evaluated prior to induction Oxygen Delivery Method: Circle System Utilized Preoxygenation: Pre-oxygenation with 100% oxygen Induction Type: IV induction Ventilation: Mask ventilation without difficulty LMA: LMA with gastric port inserted LMA Size: 4.0 Number of attempts: 1 Airway Equipment and Method: Bite block Placement Confirmation: positive ETCO2 Tube secured with: Tape Dental Injury: Teeth and Oropharynx as per pre-operative assessment

## 2023-06-29 NOTE — H&P (Signed)
 REFERRING PHYSICIAN: Gudena, Vinay K, MD PROVIDER: Arlester Bence, MD MRN: [id number] DOB: 06/10/1965 Subjective    Chief Complaint: Breast Cancer  History of Present Illness: Mary Campbell is a 58 y.o. female who is seen today as an office consultation for evaluation of Breast Cancer  We are asked to see the patient in consultation by Dr. Lee Public to evaluate her for a new left breast cancer. The patient is a 58 year old white female who recently went for a routine screening mammogram. At that time she was found to have a 1.2 cm mass in the central portion of the left breast. The axilla looked normal. 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 She does have some significant medical problems including diabetes, hypertension, sleep apnea, and emphysema. She smokes about a pack of cigarettes a day. She does report that she was a difficult intubation the last time she had surgery about 20 years ago  Review of Systems: A complete review of systems was obtained from the patient. I have reviewed this information and discussed as appropriate with the patient. See HPI as well for other ROS.  ROS   Medical History: Past Medical History:  Diagnosis Date  Anxiety  Arthritis  Diabetes mellitus without complication (CMS/HHS-HCC)  GERD (gastroesophageal reflux disease)  Hyperlipidemia  Hypertension  Sleep apnea   Patient Active Problem List  Diagnosis  Acid reflux  Claudication in peripheral vascular disease ()  Depression  Diabetes mellitus (CMS/HHS-HCC)  DOE (dyspnea on exertion)  Early menopause  Emphysema of lung (CMS/HHS-HCC)  Essential hypertension  Fibromyalgia  Family history of heart disease  Excessive daytime sleepiness  Diverticulitis of colon  Bronchitis, chronic (CMS/HHS-HCC)  Hyperlipidemia  Lung disease  Malignant neoplasm of upper-outer quadrant of left breast in female,  estrogen receptor negative (CMS/HHS-HCC)  PAD (peripheral artery disease) ()  Tobacco abuse  Easy bruising  Tendency toward bleeding easily ()   Past Surgical History:  Procedure Laterality Date  ABDOMINAL AORTOGRAM W/LOWER EXTREMITY N/A 10/18/2018  PERIPHERAL VASCULAR ATHERECTOMY N/A 11/22/2018  ABDOMINAL AORTOGRAM W/LOWER EXTREMITY N/A 04/02/2020  ABDOMINAL AORTOGRAM W/LOWER EXTREMITY N/A 04/23/2020  Left breast biopsy Left 05/31/2023    Allergies  Allergen Reactions  Codeine Hives, Itching and Other (See Comments)  Lisinopril Cough and Other (See Comments)  Sulfa (Sulfonamide Antibiotics) Itching and Other (See Comments)   Current Outpatient Medications on File Prior to Visit  Medication Sig Dispense Refill  acetaminophen  (TYLENOL ) 650 MG ER tablet Take 1,300 mg by mouth every 8 (eight) hours as needed  albuterol  MDI, PROVENTIL , VENTOLIN , PROAIR , HFA 90 mcg/actuation inhaler Inhale 2 Inhalations into the lungs every 6 (six) hours as needed  AMARYL 2 mg tablet Take 2 mg by mouth daily with breakfast  amLODIPine-benazepril (LOTREL) 5-20 mg capsule Take 1 capsule by mouth once daily  aspirin  81 MG EC tablet Take 81 mg by mouth once daily  blood-glucose sensor (FREESTYLE LIBRE 3 SENSOR) 1 each as directed  buPROPion (WELLBUTRIN XL) 150 MG XL tablet Take 150 mg by mouth once daily  clobetasoL (TEMOVATE) 0.05 % cream Apply 1 Application topically 2 (two) times daily  clopidogreL  (PLAVIX ) 75 mg tablet Take 75 mg by mouth once daily  diphenhydrAMINE  (BENADRYL ) 25 mg capsule Take 25 mg by mouth every 6 (six) hours as needed for Allergies  DULoxetine (CYMBALTA) 60 MG DR capsule Take 60 mg by mouth once daily  fluticasone   propionate (FLONASE ) 50 mcg/actuation nasal spray Place 1 spray into one nostril 2 (two) times daily  gabapentin (NEURONTIN) 300 MG capsule Take 300 mg by mouth 2 (two) times daily  ibuprofen (MOTRIN) 200 MG tablet Take 400 mg by mouth every 6 (six) hours as needed for  Pain (as needed for moderate pain (pain score 4-6) or headache.)  insulin  GLARGINE (LANTUS ) injection (concentration 100 units/mL) Inject 48 Units subcutaneously at bedtime  JANUMET XR 50-1,000 mg ER 24 hr mutliphase tablet Take 1 tablet by mouth every 12 (twelve) hours  metFORMIN  (GLUCOPHAGE ) 1000 MG tablet Take 1,000 mg by mouth 2 (two) times daily with meals  metoprolol  SUCCinate (TOPROL -XL) 25 MG XL tablet Take 12.5 mg by mouth once daily  nicotine (NICODERM CQ) 21 mg/24 hr patch Place 1 patch onto the skin daily  rosuvastatin  (CRESTOR ) 40 MG tablet Take 40 mg by mouth once daily  TRESIBA FLEXTOUCH U-100 pen injector (concentration 100 units/mL) Inject 40 Units subcutaneously once daily   No current facility-administered medications on file prior to visit.   Family History  Problem Relation Age of Onset  Diabetes Mother  Stroke Mother  Coronary Artery Disease (Blocked arteries around heart) Father  Hyperlipidemia (Elevated cholesterol) Father  High blood pressure (Hypertension) Father  Coronary Artery Disease (Blocked arteries around heart) Brother  Hyperlipidemia (Elevated cholesterol) Brother  High blood pressure (Hypertension) Brother    Social History   Tobacco Use  Smoking Status Every Day  Types: Cigarettes  Smokeless Tobacco Never    Social History   Socioeconomic History  Marital status: Legally Separated  Tobacco Use  Smoking status: Every Day  Types: Cigarettes  Smokeless tobacco: Never  Vaping Use  Vaping status: Unknown  Substance and Sexual Activity  Alcohol use: Yes  Comment: occasionally  Drug use: Never   Objective:  There were no vitals filed for this visit.  There is no height or weight on file to calculate BMI.  Physical Exam Vitals reviewed.  Constitutional:  General: She is not in acute distress. Appearance: Normal appearance.  HENT:  Head: Normocephalic and atraumatic.  Right Ear: External ear normal.  Left Ear: External ear normal.   Nose: Nose normal.  Mouth/Throat:  Mouth: Mucous membranes are moist.  Pharynx: Oropharynx is clear.  Eyes:  General: No scleral icterus. Extraocular Movements: Extraocular movements intact.  Conjunctiva/sclera: Conjunctivae normal.  Pupils: Pupils are equal, round, and reactive to light.  Cardiovascular:  Rate and Rhythm: Normal rate and regular rhythm.  Pulses: Normal pulses.  Heart sounds: Normal heart sounds.  Pulmonary:  Effort: Pulmonary effort is normal. No respiratory distress.  Breath sounds: Normal breath sounds.  Abdominal:  General: Bowel sounds are normal.  Palpations: Abdomen is soft.  Tenderness: There is no abdominal tenderness.  Musculoskeletal:  General: No swelling, tenderness or deformity. Normal range of motion.  Cervical back: Normal range of motion and neck supple.  Skin: General: Skin is warm and dry.  Coloration: Skin is not jaundiced.  Neurological:  General: No focal deficit present.  Mental Status: She is alert and oriented to person, place, and time.  Psychiatric:  Mood and Affect: Mood normal.  Behavior: Behavior normal.     Breast: There is no palpable mass in either breast. There is no palpable axillary, supraclavicular, or cervical lymphadenopathy.  Labs, Imaging and Diagnostic Testing:  Assessment and Plan:   Diagnoses and all orders for this visit:  Malignant neoplasm of central portion of left breast in female, estrogen receptor negative (CMS/HHS-HCC) - [facility]  Case Posting Request; Future   The patient appears to have a 1.2 cm cancer in the central portion of the left breast with clinically negative nodes. I have discussed with her in detail the different options for treatment and at this point she favors breast conservation which I feel is very reasonable. She will be a good candidate for sentinel node biopsy as well. She will likely need a port since she is HER2 positive for adjuvant chemotherapy. I have discussed with her in detail  the risks and benefits of the operation as well as some of the technical aspects including use of a radioactive seed and the risk of pneumothorax and she understands and wishes to proceed. We will move forward with surgical scheduling. She will need clearance to come off her Plavix  from Dr. Dean Every in cardiology. She will also meet with medical and radiation oncology to discuss adjuvant therapy   Arlester Bence, MD

## 2023-06-29 NOTE — Telephone Encounter (Signed)
 Per Rodman Clam at [facility]-   We received auth this moning, patients order was moved to pap scheduling after Siegfried Dress was received. Someone will reach out to schedule her soon.

## 2023-06-29 NOTE — Telephone Encounter (Signed)
 Copied from CRM [id number]. Topic: Clinical - Order For Equipment >> Jun 28, 2023 11:23 AM Mary Campbell wrote: Reason for CRM: Patient [phone] states has not heard from the office on cpap order. Informed patient, NP, Cobb ordered 06/19/23 cpap to [facility] and then [facility]. Patient states havine surgery tomorrow, patient has breast cancer. Please advise and call back.  We placed referral for CPAP on 06/19/23 and it is documented that [facility] received it. Routing to [facility] pool for them to check the status.

## 2023-06-29 NOTE — Anesthesia Postprocedure Evaluation (Signed)
 Anesthesia Post Note  Patient: Chief Technology Officer  Procedure(s) Performed: BREAST LUMPECTOMY WITH RADIOACTIVE SEED AND SENTINEL LYMPH NODE BIOPSY (Left: Breast) INSERTION, TUNNELED CENTRAL VENOUS DEVICE, WITH PORT     Patient location during evaluation: PACU Anesthesia Type: Regional and General Level of consciousness: awake and alert Pain management: pain level controlled Vital Signs Assessment: post-procedure vital signs reviewed and stable Respiratory status: spontaneous breathing, nonlabored ventilation and respiratory function stable Cardiovascular status: blood pressure returned to baseline and stable Postop Assessment: no apparent nausea or vomiting Anesthetic complications: no  No notable events documented.  Last Vitals:  Vitals:   06/29/23 1645 06/29/23 1700  BP: 138/67 (!) 144/63  Pulse: 60 63  Resp: 17 16  Temp:  (!) 36.4 C  SpO2: 94% 93%    Last Pain:  Vitals:   06/29/23 1700  PainSc: 0-No pain                 Trypp Heckmann,W. EDMOND

## 2023-06-29 NOTE — Transfer of Care (Signed)
 Immediate Anesthesia Transfer of Care Note  Patient: Mary Campbell  Procedure(s) Performed: BREAST LUMPECTOMY WITH RADIOACTIVE SEED AND SENTINEL LYMPH NODE BIOPSY (Left: Breast) INSERTION, TUNNELED CENTRAL VENOUS DEVICE, WITH PORT  Patient Location: PACU  Anesthesia Type:General and Regional  Level of Consciousness: drowsy and patient cooperative  Airway & Oxygen Therapy: Patient Spontanous Breathing  Post-op Assessment: Report given to RN and Post -op Vital signs reviewed and stable  Post vital signs: Reviewed and stable  Last Vitals:  Vitals Value Taken Time  BP 139/59 06/29/23 16:23  Temp    Pulse 78 06/29/23 16:25  Resp 22 06/29/23 16:25  SpO2 92 % 06/29/23 16:25  Vitals shown include unfiled device data.  Last Pain:  Vitals:   06/29/23 1410  PainSc: 0-No pain         Complications: No notable events documented.

## 2023-06-29 NOTE — Op Note (Signed)
 06/29/2023  4:13 PM  PATIENT:  Mary Campbell  58 y.o. female  PRE-OPERATIVE DIAGNOSIS:  LEFT BREAST CANCER  POST-OPERATIVE DIAGNOSIS:  LEFT BREAST CANCER  PROCEDURE:  Procedure(s) with comments: LEFT BREAST RADIOACTIVE SEED LOCALIZED LUMPECTOMY AND DEEP LEFT AXILLARY SENTINEL LYMPH NODE BIOPSY INSERTION TUNNELED CENTRAL VENOUS DEVICE,  PORT    SURGEON:  Surgeons and Role:    * Caralyn Chandler, MD - Primary  PHYSICIAN ASSISTANT:   ASSISTANTS: none   ANESTHESIA:   local and general  EBL:  minimal   BLOOD ADMINISTERED:none  DRAINS: none   LOCAL MEDICATIONS USED:  MARCAINE     SPECIMEN:  Source of Specimen:  left breast tissue with additional medial margin and sentinel nodes x 2  DISPOSITION OF SPECIMEN:  PATHOLOGY  COUNTS:  YES  TOURNIQUET:  * No tourniquets in log *  DICTATION: .Dragon Dictation  After informed consent was obtained the patient was brought to the operating room and placed in the supine position on the operating table.  After adequate induction of general anesthesia a roll was placed between the patient's shoulders to extend the shoulder slightly.  Next the patient's bilateral chest, breast, and axillary areas were prepped with ChloraPrep, allowed to dry, and draped in usual sterile manner.  An appropriate timeout was performed.  2 cc of mag trace were then injected into the subareolar plexus of the left breast.  Attention was first turned to the right chest wall.  The area lateral to the bend of the clavicle and the right chest wall was infiltrated with quarter percent Marcaine.  A large bore needle from the Port-A-Cath kit was used to slide beneath the bend of the clavicle heading towards the sternal notch and in doing so I was able to access the right subclavian vein without difficulty.  A wire was fed through the needle using the Seldinger technique without difficulty.  The wire was confirmed in the central venous system using real-time fluoroscopy.  Next a  small incision was made at the wire entry site.  The incision was carried through the skin and subcutaneous tissue sharply with the electrocautery.  A subcutaneous pocket was created inferior to the incision by blunt finger dissection.  Next the tubing was placed on the reservoir.  The reservoir was placed in the pocket and the length of the tubing was estimated using real-time fluoroscopy.  The tubing was cut to the appropriate length.  Next a sheath and dilator were fed over the wire using the Seldinger technique without difficulty.  The dilator and wire were removed from the patient.  The tubing was fed through the sheath as far as it would go and then held in place while the sheath was gently cracked and separated.  Another real-time fluoroscopy image showed the tip of the catheter to be in the distal superior vena cava.  The tubing was then permanently anchored to the reservoir.  The reservoir was anchored in the pocket with two 2-0 Prolene stitches.  The port was then aspirated and it aspirated blood easily.  The port was then flushed initially with a dilute heparin  solution and then with a more concentrated heparin  solution.  The subcutaneous tissue was closed over the port with interrupted 3-0 Vicryl stitches.  The skin was closed with a running 4-0 Monocryl subcuticular stitch.  Attention was then turned to the left breast.  The Sentimag was used to identify a faint signal in the left axilla.  The area overlying this was infiltrated with  quarter percent Marcaine.  A small transversely oriented incision was made with a 15 blade knife.  The incision was carried through the skin and subcutaneous tissue sharply with the electrocautery until the deep left axillary space was entered.  The Sentimag was used to direct blunt dissection in the deep left axillary space.  I was able to identify it to lymph nodes with faint signal.  Each of these nodes was excised sharply with the electrocautery and the surrounding  small vessels and lymphatics were controlled with clips.  These were sent as sentinel nodes numbers 1 and 2.  No other hot or palpable nodes were identified in the left axilla.  Hemostasis was achieved using the Bovie electrocautery.  The deep layer of the axilla was closed with interrupted 3-0 Vicryl stitches.  The skin was closed with a running 4-0 Monocryl subcuticular stitch.  Attention was then turned to the left breast itself.  Previously an I-125 seed was placed in the upper inner quadrant of the left breast to mark an area of invasive breast cancer.  The neoprobe was set to I-125 in the area of radioactivity was readily identified.  The area around this was infiltrated with quarter percent Marcaine.  A curvilinear incision was then made along the upper inner edge of the areola with a 15 blade knife.  The incision was carried through the skin and subcutaneous tissue sharply with the electrocautery.  Dissection was then carried out in the upper inner quadrant between the breast tissue and the subcutaneous fat and skin.  Once this dissection was beyond the area of the cancer I then removed a circular portion of breast tissue sharply with the electrocautery around the radioactive seed while checking the area of radioactivity frequently.  Once this tissue was removed it was oriented with the appropriate paint colors.  A specimen radiograph was obtained that showed the clip and seed to be near the center of the specimen.  I did elect to take an additional medial margin and this was marked appropriately.  All of the tissue was then sent to pathology for further evaluation.  Hemostasis was achieved using the Bovie electrocautery.  The wound was irrigated with saline and infiltrated with more quarter percent Marcaine.  The cavity was marked with clips.  The deep layer of the incision was then closed with layers of interrupted 3-0 Vicryl stitches.  The skin was closed with interrupted 4-0 Monocryl subcuticular  stitches.  Dermabond dressings were applied.  The patient tolerated the procedure well.  At the end of the case all needle sponge and instrument counts were correct.  The patient was then awakened and taken recovery in stable condition.  PLAN OF CARE: Discharge to home after PACU  PATIENT DISPOSITION:  PACU - hemodynamically stable.   Delay start of Pharmacological VTE agent (>24hrs) due to surgical blood loss or risk of bleeding: not applicable

## 2023-06-30 ENCOUNTER — Encounter (HOSPITAL_COMMUNITY): Payer: Self-pay | Admitting: General Surgery

## 2023-07-03 ENCOUNTER — Ambulatory Visit (HOSPITAL_COMMUNITY)
Admission: RE | Admit: 2023-07-03 | Discharge: 2023-07-03 | Disposition: A | Discharge status: Home / Self Care | Source: Ambulatory Visit | Attending: Hematology and Oncology | Admitting: Hematology and Oncology

## 2023-07-03 ENCOUNTER — Encounter: Payer: Self-pay | Admitting: *Deleted

## 2023-07-03 ENCOUNTER — Ambulatory Visit: Payer: Self-pay | Admitting: General Surgery

## 2023-07-03 DIAGNOSIS — G4730 Sleep apnea, unspecified: Secondary | ICD-10-CM | POA: Insufficient documentation

## 2023-07-03 DIAGNOSIS — I10 Essential (primary) hypertension: Secondary | ICD-10-CM | POA: Diagnosis not present

## 2023-07-03 DIAGNOSIS — Z01818 Encounter for other preprocedural examination: Secondary | ICD-10-CM | POA: Diagnosis present

## 2023-07-03 DIAGNOSIS — C50412 Malignant neoplasm of upper-outer quadrant of left female breast: Secondary | ICD-10-CM | POA: Diagnosis not present

## 2023-07-03 DIAGNOSIS — R0602 Shortness of breath: Secondary | ICD-10-CM | POA: Diagnosis not present

## 2023-07-03 DIAGNOSIS — Z171 Estrogen receptor negative status [ER-]: Secondary | ICD-10-CM | POA: Insufficient documentation

## 2023-07-03 DIAGNOSIS — F17200 Nicotine dependence, unspecified, uncomplicated: Secondary | ICD-10-CM | POA: Insufficient documentation

## 2023-07-03 DIAGNOSIS — E785 Hyperlipidemia, unspecified: Secondary | ICD-10-CM | POA: Diagnosis not present

## 2023-07-03 DIAGNOSIS — Z0189 Encounter for other specified special examinations: Secondary | ICD-10-CM | POA: Diagnosis not present

## 2023-07-03 LAB — ECHOCARDIOGRAM COMPLETE
AR max vel: 2.62 cm2
AV Area VTI: 2.68 cm2
AV Area mean vel: 2.68 cm2
AV Mean grad: 5 mmHg
AV Peak grad: 9.7 mmHg
Ao pk vel: 1.56 m/s
Area-P 1/2: 3.48 cm2
Calc EF: 76 %
S' Lateral: 2.5 cm
Single Plane A2C EF: 79.3 %
Single Plane A4C EF: 69.2 %

## 2023-07-03 LAB — SURGICAL PATHOLOGY

## 2023-07-03 NOTE — Progress Notes (Signed)
  Echocardiogram 2D Echocardiogram has been performed.  Royden Corin 07/03/2023, 11:59 AM

## 2023-07-06 NOTE — Telephone Encounter (Signed)
 Patient informed she may hold Plavix . Patient voiced understanding.

## 2023-07-08 ENCOUNTER — Other Ambulatory Visit: Payer: Self-pay

## 2023-07-08 ENCOUNTER — Emergency Department (HOSPITAL_COMMUNITY)
Admission: EM | Admit: 2023-07-08 | Discharge: 2023-07-09 | Discharge status: Against Medical Advice | Attending: Emergency Medicine | Admitting: Emergency Medicine

## 2023-07-08 DIAGNOSIS — Z5321 Procedure and treatment not carried out due to patient leaving prior to being seen by health care provider: Secondary | ICD-10-CM | POA: Insufficient documentation

## 2023-07-08 DIAGNOSIS — N6489 Other specified disorders of breast: Secondary | ICD-10-CM | POA: Insufficient documentation

## 2023-07-08 LAB — CBC WITH DIFFERENTIAL/PLATELET
Abs Immature Granulocytes: 0.1 10*3/uL — ABNORMAL HIGH (ref 0.00–0.07)
Basophils Absolute: 0.1 10*3/uL (ref 0.0–0.1)
Basophils Relative: 1 %
Eosinophils Absolute: 0.2 10*3/uL (ref 0.0–0.5)
Eosinophils Relative: 1 %
HCT: 31.2 % — ABNORMAL LOW (ref 36.0–46.0)
Hemoglobin: 10.2 g/dL — ABNORMAL LOW (ref 12.0–15.0)
Immature Granulocytes: 1 %
Lymphocytes Relative: 25 %
Lymphs Abs: 3.3 10*3/uL (ref 0.7–4.0)
MCH: 29.7 pg (ref 26.0–34.0)
MCHC: 32.7 g/dL (ref 30.0–36.0)
MCV: 90.7 fL (ref 80.0–100.0)
Monocytes Absolute: 0.6 10*3/uL (ref 0.1–1.0)
Monocytes Relative: 5 %
Neutro Abs: 9.1 10*3/uL — ABNORMAL HIGH (ref 1.7–7.7)
Neutrophils Relative %: 67 %
Platelets: 328 10*3/uL (ref 150–400)
RBC: 3.44 MIL/uL — ABNORMAL LOW (ref 3.87–5.11)
RDW: 15.3 % (ref 11.5–15.5)
WBC: 13.3 10*3/uL — ABNORMAL HIGH (ref 4.0–10.5)
nRBC: 0 % (ref 0.0–0.2)

## 2023-07-08 LAB — COMPREHENSIVE METABOLIC PANEL WITH GFR
ALT: 11 U/L (ref 0–44)
AST: 17 U/L (ref 15–41)
Albumin: 3.1 g/dL — ABNORMAL LOW (ref 3.5–5.0)
Alkaline Phosphatase: 135 U/L — ABNORMAL HIGH (ref 38–126)
Anion gap: 12 (ref 5–15)
BUN: 11 mg/dL (ref 6–20)
CO2: 23 mmol/L (ref 22–32)
Calcium: 8.7 mg/dL — ABNORMAL LOW (ref 8.9–10.3)
Chloride: 102 mmol/L (ref 98–111)
Creatinine, Ser: 1.13 mg/dL — ABNORMAL HIGH (ref 0.44–1.00)
GFR, Estimated: 56 mL/min — ABNORMAL LOW (ref >60)
Glucose, Bld: 119 mg/dL — ABNORMAL HIGH (ref 70–99)
Potassium: 3.2 mmol/L — ABNORMAL LOW (ref 3.5–5.1)
Sodium: 137 mmol/L (ref 135–145)
Total Bilirubin: 0.3 mg/dL (ref 0.0–1.2)
Total Protein: 6.5 g/dL (ref 6.5–8.1)

## 2023-07-08 NOTE — ED Triage Notes (Signed)
 Patient noticed drainage/swelling at incision site of her left breast this afternoon , surgery done last week. No fever or chills.

## 2023-07-08 NOTE — ED Notes (Signed)
 Pt states they are not going to be here all night and they will call her PCP in the morning. Pt seen leaving the ED with family.

## 2023-07-09 ENCOUNTER — Ambulatory Visit
Admission: EM | Admit: 2023-07-09 | Discharge: 2023-07-09 | Disposition: A | Discharge status: Home / Self Care | Attending: Nurse Practitioner | Admitting: Nurse Practitioner

## 2023-07-09 DIAGNOSIS — T8149XA Infection following a procedure, other surgical site, initial encounter: Secondary | ICD-10-CM | POA: Insufficient documentation

## 2023-07-09 DIAGNOSIS — L7682 Other postprocedural complications of skin and subcutaneous tissue: Secondary | ICD-10-CM | POA: Insufficient documentation

## 2023-07-09 MED ORDER — CLINDAMYCIN HCL 300 MG PO CAPS: 300.0000 mg | ORAL_CAPSULE | Freq: Three times a day (TID) | ORAL | 0 refills | Status: DC

## 2023-07-09 MED ORDER — CEPHALEXIN 500 MG PO CAPS: 500.0000 mg | ORAL_CAPSULE | Freq: Three times a day (TID) | ORAL | 0 refills | Status: AC

## 2023-07-09 NOTE — Discharge Instructions (Addendum)
 You were seen today for pain, swelling, and drainage at your recent breast surgery site. Based on your symptoms and a recent elevated white blood cell count, a possible post-surgical infection is suspected. A sample was collected for culture, and you were started on antibiotics (cephalexin and clindamycin) to treat common skin bacteria, including MRSA, MSSA, and strep. Continue taking tramadol  at home as needed for pain.   Keep the area covered if there is active drainage. If the site is dry, you may leave it uncovered after gently washing and allowing it to air dry following a shower. Avoid applying creams, ointments, or powders.   Follow up with your oncology team tomorrow and inform them of your recent ER and urgent care visits. Go to the emergency department if you experience worsening pain, increasing redness or swelling, excessive or foul-smelling drainage, fever, nausea, or vomiting, as these may be signs of a more serious infection.

## 2023-07-09 NOTE — ED Triage Notes (Signed)
 Patient with breast surgery and lymph node removal last week. States since yesterday it has been swelling and there has been a lot of drainage coming from the site.

## 2023-07-09 NOTE — ED Provider Notes (Signed)
 [facility]    CSN: [id number] Arrival date & time: 07/09/23  0932      History   Chief Complaint Chief Complaint  Patient presents with   Post-op Problem    HPI Mary Campbell is a 58 y.o. female.   Discussed the use of AI scribe software for clinical note transcription with the patient, who gave verbal consent to proceed.   Mary Campbell is a 58 y.o. female that presents with pain, swelling, and drainage from the left axilla areas following recent breast cancer surgery. The patient underwent a lumpectomy with lymph node removal and radioactive seed placement on June 29, 2023, after being diagnosed with breast cancer in May. The patient reports that on Thursday (4 days ago), she began experiencing pain and swelling in the surgical site. By Friday, the area had become significantly enlarged and started leaking a reddish fluid. The patient describes the leakage as substantial, noting that the affected area was a lot bigger before it started leaking. She has been managing the drainage by applying bandages to the area. The patient denies experiencing fever, chills, or body aches, but reports ongoing pain at the surgical site. She mentions that the bruising was worse previously but has since improved. Last night, the patient attempted to seek care at an emergency room but left after a 6-hour wait without being seen. She reports having tramadol  at home, which provides some pain relief. The patient is allergic to sulfa. She has not yet started chemotherapy for her breast cancer.  The following portions of the patient's history were reviewed and updated as appropriate: allergies, current medications, past family history, past medical history, past social history, past surgical history, and problem list.    Past Medical History:  Diagnosis Date   Anxiety    Arthritis    Breast cancer (HCC)    left   Chronic back pain    COPD (chronic obstructive pulmonary disease) (HCC)     Diabetes mellitus    Difficult intubation    Diverticulitis    Dyspnea    with exertion   Fibromyalgia    Hyperlipidemia    Hypertension    PAD (peripheral artery disease) (HCC)    a. PV angio 10/2018 - bilateral SFA disease s/p L intervention.   PAD (peripheral artery disease) (HCC)    PONV (postoperative nausea and vomiting)    Sleep apnea    Tobacco abuse     Patient Active Problem List   Diagnosis Date Noted   Severe obstructive sleep apnea 06/19/2023   Antiplatelet or antithrombotic long-term use 06/15/2023   Positive colorectal cancer screening using Cologuard test 06/15/2023   Malignant neoplasm of upper-outer quadrant of left breast in female, estrogen receptor negative (HCC) 06/05/2023   Excessive daytime sleepiness 12/22/2022   DOE (dyspnea on exertion) 12/22/2022   Centrilobular emphysema (HCC) 12/22/2022   Claudication in peripheral vascular disease (HCC) 10/04/2018   Essential hypertension 10/04/2018   Hyperlipidemia 10/04/2018   Tobacco abuse 10/04/2018   Family history of heart disease 10/04/2018    Past Surgical History:  Procedure Laterality Date   ABDOMINAL AORTOGRAM W/LOWER EXTREMITY Right 10/18/2018   Procedure: ABDOMINAL AORTOGRAM W/LOWER EXTREMITY;  Surgeon: Court Dorn PARAS, MD;  Location: [facility];  Service: Cardiovascular;  Laterality: Right;   ABDOMINAL AORTOGRAM W/LOWER EXTREMITY N/A 04/02/2020   Procedure: ABDOMINAL AORTOGRAM W/LOWER EXTREMITY;  Surgeon: Court Dorn PARAS, MD;  Location: [facility];  Service: Cardiovascular;  Laterality: N/A;   ABDOMINAL AORTOGRAM  W/LOWER EXTREMITY N/A 04/23/2020   Procedure: ABDOMINAL AORTOGRAM W/LOWER EXTREMITY;  Surgeon: Court Dorn PARAS, MD;  Location: [facility];  Service: Cardiovascular;  Laterality: N/A;   ABDOMINAL HYSTERECTOMY     BREAST BIOPSY Left 05/31/2023   US  LT BREAST BX W LOC DEV 1ST LESION IMG BX SPEC US  GUIDE 05/31/2023 [facility]   BREAST BIOPSY  06/27/2023   MM  LT RADIOACTIVE SEED LOC MAMMO GUIDE 06/27/2023 [facility]   BREAST LUMPECTOMY WITH RADIOACTIVE SEED AND SENTINEL LYMPH NODE BIOPSY Left 06/29/2023   Procedure: BREAST LUMPECTOMY WITH RADIOACTIVE SEED AND SENTINEL LYMPH NODE BIOPSY;  Surgeon: Curvin Deward MOULD, MD;  Location: [facility];  Service: General;  Laterality: Left;  LEFT BREAST RADIOACTIVE SEED LOCALIZED LUMPECTOMY   SENTINEL NODE BIOPSY   CHOLECYSTECTOMY     PERIPHERAL VASCULAR ATHERECTOMY Left 10/18/2018   Procedure: PERIPHERAL VASCULAR ATHERECTOMY;  Surgeon: Court Dorn PARAS, MD;  Location: [facility];  Service: Cardiovascular;  Laterality: Left;  SFA   PERIPHERAL VASCULAR ATHERECTOMY Right 11/22/2018   Procedure: PERIPHERAL VASCULAR ATHERECTOMY;  Surgeon: Court Dorn PARAS, MD;  Location: [facility];  Service: Cardiovascular;  Laterality: Right;  SFA   PERIPHERAL VASCULAR ATHERECTOMY  04/23/2020   Procedure: PERIPHERAL VASCULAR ATHERECTOMY;  Surgeon: Court Dorn PARAS, MD;  Location: [facility] CV [facility];  Service: Cardiovascular;;   PERIPHERAL VASCULAR BALLOON ANGIOPLASTY Right 11/22/2018   Procedure: PERIPHERAL VASCULAR BALLOON ANGIOPLASTY;  Surgeon: Court Dorn PARAS, MD;  Location: [facility];  Service: Cardiovascular;  Laterality: Right;  SFA   PERIPHERAL VASCULAR BALLOON ANGIOPLASTY  04/23/2020   Procedure: PERIPHERAL VASCULAR BALLOON ANGIOPLASTY;  Surgeon: Court Dorn PARAS, MD;  Location: [facility];  Service: Cardiovascular;;   PERIPHERAL VASCULAR INTERVENTION Left 04/02/2020   Procedure: PERIPHERAL VASCULAR INTERVENTION;  Surgeon: Court Dorn PARAS, MD;  Location: [facility];  Service: Cardiovascular;  Laterality: Left;   PORTACATH PLACEMENT N/A 06/29/2023   Procedure: INSERTION, TUNNELED CENTRAL VENOUS DEVICE, WITH PORT;  Surgeon: Curvin Deward MOULD, MD;  Location: [facility];  Service: General;  Laterality: N/A;  PORT PLACEMENT WITH ULTRASOUND GUIDANCE GEN w/PEC BLOCK    OB History   No obstetric  history on file.      Home Medications    Prior to Admission medications   Medication Sig Start Date End Date Taking? Authorizing Provider  cephALEXin (KEFLEX) 500 MG capsule Take 1 capsule (500 mg total) by mouth 3 (three) times daily for 7 days. 07/09/23 07/16/23 Yes Alexios Keown, FNP  clindamycin (CLEOCIN) 300 MG capsule Take 1 capsule (300 mg total) by mouth 3 (three) times daily for 7 days. 07/09/23 07/16/23 Yes Iola Lukes, FNP  acetaminophen  (TYLENOL ) 650 MG CR tablet Take 1,300 mg by mouth in the morning, at noon, and at bedtime.    [provider]  albuterol  (VENTOLIN  HFA) 108 (90 Base) MCG/ACT inhaler Inhale 2 puffs into the lungs every 6 (six) hours as needed for wheezing or shortness of breath. 12/22/22   Cobb, Comer GAILS, NP  amLODipine-benazepril (LOTREL) 5-20 MG capsule Take 1 capsule by mouth in the morning.    [provider]  aspirin  EC 81 MG tablet Take 81 mg by mouth in the morning.    [provider]  buPROPion (WELLBUTRIN XL) 150 MG 24 hr tablet Take 150 mg by mouth in the morning. 05/23/23   [provider]  clobetasol cream (TEMOVATE) 0.05 % Apply 1 Application topically 2 (two) times daily as  needed (skin irritation).    [provider]  clopidogrel  (PLAVIX ) 75 MG tablet Take 1 tablet (75 mg total) by mouth daily. 07/07/22   Court Dorn PARAS, MD  DULoxetine (CYMBALTA) 30 MG capsule Take 30 mg by mouth in the morning.    [provider]  DULoxetine (CYMBALTA) 60 MG capsule Take 60 mg by mouth in the morning.    [provider]  fluticasone  (FLONASE ) 50 MCG/ACT nasal spray Place 1-2 sprays into both nostrils daily. Patient taking differently: Place 1-2 sprays into both nostrils daily as needed for allergies. 06/19/23   Cobb, Comer GAILS, NP  gabapentin  (NEURONTIN ) 100 MG capsule Take 300 mg by mouth 2 (two) times daily.    [provider]  JANUMET XR 50-1000 MG TB24 Take 1 tablet by mouth in the  morning and at bedtime. 02/23/23   [provider]  LANTUS  SOLOSTAR 100 UNIT/ML Solostar Pen Inject 50 Units into the skin at bedtime. 05/01/23   [provider]  MAGNESIUM GLYCINATE PO Take 1,000 mg by mouth at bedtime.    [provider]  nicotine (NICODERM CQ - DOSED IN MG/24 HOURS) 21 mg/24hr patch Place 1 patch onto the skin daily. 05/23/23   [provider]  rosuvastatin  (CRESTOR ) 40 MG tablet Take 1 tablet (40 mg total) by mouth daily. Patient taking differently: Take 40 mg by mouth at bedtime. 01/12/23   O'NealDarryle Ned, MD  Tiotropium Bromide-Olodaterol (STIOLTO RESPIMAT ) 2.5-2.5 MCG/ACT AERS Inhale 2 puffs into the lungs daily. 06/19/23   Cobb, Comer GAILS, NP  traMADol  (ULTRAM ) 50 MG tablet Take 1 tablet (50 mg total) by mouth every 6 (six) hours as needed. 06/29/23 06/28/24  Curvin Deward MOULD, MD    Family History Family History  Problem Relation Age of Onset   Diabetes Mother    Hypertension Mother    Hyperlipidemia Mother    Heart attack Father    Heart disease Father    Hypertension Father    Hyperlipidemia Father    Breast cancer Neg Hx    Colon cancer Neg Hx    Esophageal cancer Neg Hx     Social History Social History   Tobacco Use   Smoking status: Some Days    Current packs/day: 0.00    Types: Cigarettes    Last attempt to quit: 08/11/2022    Years since quitting: 0.9   Smokeless tobacco: Never   Tobacco comments:    Trying quit with patch  Vaping Use   Vaping status: Never Used  Substance Use Topics   Alcohol use: No   Drug use: No     Allergies   Other, Codeine, Lisinopril, and Sulfonamide derivatives   Review of Systems Review of Systems  Constitutional:  Negative for fatigue and fever.  Gastrointestinal:  Negative for nausea and vomiting.  Skin:  Positive for wound.  All other systems reviewed and are negative.    Physical Exam Triage Vital Signs ED Triage Vitals  Encounter Vitals Group     BP       Girls Systolic BP Percentile      Girls Diastolic BP Percentile      Boys Systolic BP Percentile      Boys Diastolic BP Percentile      Pulse      Resp      Temp      Temp src      SpO2      Weight      Height  Head Circumference      Peak Flow      Pain Score      Pain Loc      Pain Education      Exclude from Growth Chart    No data found.  Updated Vital Signs BP (!) 150/61 (BP Location: Right Arm)   Pulse 80   Temp 98.1 F (36.7 C) (Oral)   Resp 18   SpO2 95%   Visual Acuity Right Eye Distance:   Left Eye Distance:   Bilateral Distance:    Right Eye Near:   Left Eye Near:    Bilateral Near:     Physical Exam Vitals and nursing note reviewed. Exam conducted with a chaperone present Bonney Sharps, RN).  Constitutional:      General: She is not in acute distress.    Appearance: Normal appearance. She is not toxic-appearing.  HENT:     Head: Normocephalic.     Mouth/Throat:     Mouth: Mucous membranes are moist.   Eyes:     Conjunctiva/sclera: Conjunctivae normal.    Cardiovascular:     Rate and Rhythm: Normal rate and regular rhythm.     Heart sounds: Normal heart sounds.  Pulmonary:     Effort: Pulmonary effort is normal.     Breath sounds: Normal breath sounds.  Chest:     Comments: Tenderness, swelling and scant amount of bloody drainage noted within the surgical incision of the left axilla. No evidence of wound dehiscence. Site is painful to the touch. No increased warmth or significant redness noted (see picture below)   Musculoskeletal:        General: Normal range of motion.   Skin:    General: Skin is warm and dry.   Neurological:     General: No focal deficit present.     Mental Status: She is alert and oriented to person, place, and time.      UC Treatments / Results  Labs (all labs ordered are listed, but only abnormal results are displayed) Labs Reviewed  AEROBIC CULTURE W GRAM STAIN (SUPERFICIAL SPECIMEN)     EKG   Radiology No results found.  Procedures Procedures (including critical care time)  Medications Ordered in UC Medications - No data to display  Initial Impression / Assessment and Plan / UC Course  I have reviewed the triage vital signs and the nursing notes.  Pertinent labs & imaging results that were available during my care of the patient were reviewed by me and considered in my medical decision making (see chart for details).     Patient presents with pain, swelling, and reddish drainage at the surgical site following lumpectomy with radioactive seed placement on June 12th for recently diagnosed breast cancer. Symptoms began on June 20th with drainage noted the following day. No systemic symptoms such as fever, chills, or body aches reported. Patient visited the ER last night but left prior to evaluation; bloodwork showed elevated WBC of 13.3, up from 10.5 in May, raising concern for possible post-surgical infection. A wound culture was obtained, and empiric antibiotic therapy with cephalexin and clindamycin was initiated to cover MRSA, MSSA, and streptococcal species. Patient was advised to continue using tramadol  at home for pain as needed. Instructions were provided on wound care, including keeping the area covered if draining and otherwise allowing it to air dry after showering. Patient will follow up with oncology tomorrow and was advised to notify them of recent ER and urgent care visits. Return  to the ER if symptoms worsen, including increased pain, spreading redness, excessive drainage, fever, nausea, or vomiting.  Today's evaluation has revealed no signs of a dangerous process. Discussed diagnosis with patient and/or guardian. Patient and/or guardian aware of their diagnosis, possible red flag symptoms to watch out for and need for close follow up. Patient and/or guardian understands verbal and written discharge instructions. Patient and/or guardian comfortable with plan  and disposition.  Patient and/or guardian has a clear mental status at this time, good insight into illness (after discussion and teaching) and has clear judgment to make decisions regarding their care  Documentation was completed with the aid of voice recognition software. Transcription may contain typographical errors. Final Clinical Impressions(s) / UC Diagnoses   Final diagnoses:  Pain at surgical incision  Postoperative wound infection     Discharge Instructions      You were seen today for pain, swelling, and drainage at your recent breast surgery site. Based on your symptoms and a recent elevated white blood cell count, a possible post-surgical infection is suspected. A sample was collected for culture, and you were started on antibiotics (cephalexin and clindamycin) to treat common skin bacteria, including MRSA, MSSA, and strep. Continue taking tramadol  at home as needed for pain.   Keep the area covered if there is active drainage. If the site is dry, you may leave it uncovered after gently washing and allowing it to air dry following a shower. Avoid applying creams, ointments, or powders.   Follow up with your oncology team tomorrow and inform them of your recent ER and urgent care visits. Go to the emergency department if you experience worsening pain, increasing redness or swelling, excessive or foul-smelling drainage, fever, nausea, or vomiting, as these may be signs of a more serious infection.      ED Prescriptions     Medication Sig Dispense Auth. Provider   clindamycin (CLEOCIN) 300 MG capsule Take 1 capsule (300 mg total) by mouth 3 (three) times daily for 7 days. 21 capsule Iola Lukes, FNP   cephALEXin (KEFLEX) 500 MG capsule Take 1 capsule (500 mg total) by mouth 3 (three) times daily for 7 days. 21 capsule Iola Lukes, FNP      PDMP not reviewed this encounter.   Iola Lukes, [place] 07/09/23 1057

## 2023-07-12 ENCOUNTER — Telehealth: Payer: Self-pay | Admitting: Nurse Practitioner

## 2023-07-12 ENCOUNTER — Ambulatory Visit (HOSPITAL_COMMUNITY): Payer: Self-pay

## 2023-07-12 ENCOUNTER — Inpatient Hospital Stay: Attending: Hematology and Oncology | Admitting: Hematology and Oncology

## 2023-07-12 ENCOUNTER — Encounter: Payer: Self-pay | Admitting: Hematology and Oncology

## 2023-07-12 VITALS — BP 140/50 | HR 99 | Temp 98.5°F | Resp 18 | Ht 64.0 in | Wt 176.6 lb

## 2023-07-12 DIAGNOSIS — Z171 Estrogen receptor negative status [ER-]: Secondary | ICD-10-CM | POA: Insufficient documentation

## 2023-07-12 DIAGNOSIS — Z79899 Other long term (current) drug therapy: Secondary | ICD-10-CM | POA: Diagnosis not present

## 2023-07-12 DIAGNOSIS — C50412 Malignant neoplasm of upper-outer quadrant of left female breast: Secondary | ICD-10-CM | POA: Diagnosis present

## 2023-07-12 LAB — AEROBIC CULTURE W GRAM STAIN (SUPERFICIAL SPECIMEN)

## 2023-07-12 MED ORDER — LIDOCAINE-PRILOCAINE 2.5-2.5 % EX CREA: TOPICAL_CREAM | CUTANEOUS | 3 refills | Status: DC

## 2023-07-12 MED ORDER — ONDANSETRON HCL 8 MG PO TABS: 8.0000 mg | ORAL_TABLET | Freq: Three times a day (TID) | ORAL | 1 refills | Status: DC | PRN

## 2023-07-12 MED ORDER — PROCHLORPERAZINE MALEATE 10 MG PO TABS
10.0000 mg | ORAL_TABLET | Freq: Four times a day (QID) | ORAL | 1 refills | Status: DC | PRN
Start: 2023-07-12 — End: 2023-09-04

## 2023-07-12 NOTE — Progress Notes (Signed)
 START ON PATHWAY REGIMEN - Breast     Cycle 1: A cycle is 7 days:     Trastuzumab-xxxx      Paclitaxel    Cycles 2 through 12: A cycle is every 7 days:     Trastuzumab-xxxx      Paclitaxel    Cycles 13 through 25: A cycle is every 21 days:     Trastuzumab-xxxx   **Always confirm dose/schedule in your pharmacy ordering system**  Patient Characteristics: Postoperative without Neoadjuvant Therapy, M0 (Pathologic Staging), Invasive Disease, Adjuvant Therapy, HER2 Positive, ER Negative, Node Negative, pT1c, pN0/N69mi Therapeutic Status: Postoperative without Neoadjuvant Therapy, M0 (Pathologic Staging) AJCC Grade: G2 AJCC N Category: pN0 AJCC M Category: cM0 ER Status: Negative (-) AJCC 8 Stage Grouping: IA HER2 Status: Positive (+) Oncotype Dx Recurrence Score: Not Appropriate AJCC T Category: pT1c PR Status: Negative (-) Intent of Therapy: Curative Intent, Discussed with Patient

## 2023-07-12 NOTE — Progress Notes (Signed)
 Patient Care Team: System, Provider Not In as PCP - General O'Neal, Darryle Ned, MD as PCP - Cardiology (Cardiology) Odean Potts, MD as Consulting Physician (Hematology and Oncology) Glean Stephane BROCKS, RN (Inactive) as Oncology Nurse Navigator Tyree Nanetta SAILOR, RN as Oncology Nurse Navigator Curvin Deward MOULD, MD as Consulting Physician (General Surgery) Izell Domino, MD as Attending Physician (Radiation Oncology)  DIAGNOSIS:  Encounter Diagnosis  Name Primary?   Malignant neoplasm of upper-outer quadrant of left breast in female, estrogen receptor negative (HCC) Yes    SUMMARY OF ONCOLOGIC HISTORY: Oncology History  Malignant neoplasm of upper-outer quadrant of left breast in female, estrogen receptor negative (HCC)  05/31/2023 Initial Diagnosis   Screening mammogram detected left breast mass and asymmetry retroareolar region, irregular spiculated mass 1.1 cm, axilla negative, ultrasound measured 1.2 cm, biopsy: Grade 2 IDC ER 0% PR 0% Ki67 15%, HER2 3+ positive   06/07/2023 Cancer Staging   Staging form: Breast, AJCC 8th Edition - Clinical: Stage IA (cT1c, cN0, cM0, G2, ER-, PR-, HER2+) - Signed by Odean Potts, MD on 06/07/2023 Stage prefix: Initial diagnosis Histologic grading system: 3 grade system     CHIEF COMPLIANT: Follow-up after recent surgery  HISTORY OF PRESENT ILLNESS:   History of Present Illness Mary Campbell is a 58 year old female who presents for follow-up after recent surgery for breast cancer.  She underwent surgery for breast cancer on June 29, 2023. Post-operatively, she developed a seroma under her arm, initially thought to be a pocket of old blood. A culture indicated a possible staph infection, and she was prescribed Keflex. She has a follow-up appointment scheduled for further evaluation and possible aspiration of the seroma. Over the weekend, she visited the emergency room due to concerns about the seroma, where another culture was taken. She is  concerned about the impact of her current infection on the upcoming chemotherapy schedule.  Her treatment plan includes chemotherapy and radiation. Chemotherapy is scheduled to start after July 29, 2023, and will be administered weekly for twelve weeks. Radiation therapy will follow after a one-month interval post-chemotherapy, with daily sessions planned.     ALLERGIES:  is allergic to other, codeine, lisinopril, and sulfonamide derivatives.  MEDICATIONS:  Current Outpatient Medications  Medication Sig Dispense Refill   acetaminophen  (TYLENOL ) 650 MG CR tablet Take 1,300 mg by mouth in the morning, at noon, and at bedtime.     albuterol  (VENTOLIN  HFA) 108 (90 Base) MCG/ACT inhaler Inhale 2 puffs into the lungs every 6 (six) hours as needed for wheezing or shortness of breath. 8 g 2   amLODipine-benazepril (LOTREL) 5-20 MG capsule Take 1 capsule by mouth in the morning.     aspirin  EC 81 MG tablet Take 81 mg by mouth in the morning.     buPROPion (WELLBUTRIN XL) 150 MG 24 hr tablet Take 150 mg by mouth in the morning.     cephALEXin (KEFLEX) 500 MG capsule Take 1 capsule (500 mg total) by mouth 3 (three) times daily for 7 days. 21 capsule 0   clobetasol cream (TEMOVATE) 0.05 % Apply 1 Application topically 2 (two) times daily as needed (skin irritation).     clopidogrel  (PLAVIX ) 75 MG tablet Take 1 tablet (75 mg total) by mouth daily. 90 tablet 1   DULoxetine (CYMBALTA) 30 MG capsule Take 30 mg by mouth in the morning.     DULoxetine (CYMBALTA) 60 MG capsule Take 60 mg by mouth in the morning.     fluticasone  (FLONASE ) 50  MCG/ACT nasal spray Place 1-2 sprays into both nostrils daily. 18.2 mL 2   gabapentin  (NEURONTIN ) 100 MG capsule Take 300 mg by mouth 2 (two) times daily.     JANUMET XR 50-1000 MG TB24 Take 1 tablet by mouth in the morning and at bedtime.     LANTUS  SOLOSTAR 100 UNIT/ML Solostar Pen Inject 50 Units into the skin at bedtime.     Tiotropium Bromide-Olodaterol (STIOLTO  RESPIMAT) 2.5-2.5 MCG/ACT AERS Inhale 2 puffs into the lungs daily. 4 g 11   traMADol  (ULTRAM ) 50 MG tablet Take 1 tablet (50 mg total) by mouth every 6 (six) hours as needed. 15 tablet 0   nicotine (NICODERM CQ - DOSED IN MG/24 HOURS) 21 mg/24hr patch Place 1 patch onto the skin daily.     rosuvastatin  (CRESTOR ) 40 MG tablet Take 1 tablet (40 mg total) by mouth daily. (Patient taking differently: Take 40 mg by mouth at bedtime.) 90 tablet 3   Current Facility-Administered Medications  Medication Dose Route Frequency Provider Last Rate Last Admin   sodium chloride  flush (NS) 0.9 % injection 3 mL  3 mL Intravenous Q12H Court Dorn PARAS, MD       sodium chloride  flush (NS) 0.9 % injection 3 mL  3 mL Intravenous Q12H Court Dorn PARAS, MD        PHYSICAL EXAMINATION: ECOG PERFORMANCE STATUS: 1 - Symptomatic but completely ambulatory  Vitals:   07/12/23 1359  BP: (!) 140/50  Pulse: 99  Resp: 18  Temp: 98.5 F (36.9 C)  SpO2: 98%   Filed Weights   07/12/23 1359  Weight: 176 lb 9.6 oz (80.1 kg)      LABORATORY DATA:  I have reviewed the data as listed    Latest Ref Rng & Units 07/08/2023    8:04 PM 06/07/2023   12:24 PM 11/23/2020   11:25 AM  CMP  Glucose 70 - 99 mg/dL 880  813  867   BUN 6 - 20 mg/dL 11  9  12    Creatinine 0.44 - 1.00 mg/dL 8.86  9.03  8.99   Sodium 135 - 145 mmol/L 137  138  132   Potassium 3.5 - 5.1 mmol/L 3.2  4.1  4.0   Chloride 98 - 111 mmol/L 102  103  97   CO2 22 - 32 mmol/L 23  30  27    Calcium  8.9 - 10.3 mg/dL 8.7  9.1  9.2   Total Protein 6.5 - 8.1 g/dL 6.5  6.9  7.9   Total Bilirubin 0.0 - 1.2 mg/dL 0.3  0.3  0.4   Alkaline Phos 38 - 126 U/L 135  159  147   AST 15 - 41 U/L 17  10  17    ALT 0 - 44 U/L 11  8  12      Lab Results  Component Value Date   WBC 13.3 (H) 07/08/2023   HGB 10.2 (L) 07/08/2023   HCT 31.2 (L) 07/08/2023   MCV 90.7 07/08/2023   PLT 328 07/08/2023   NEUTROABS 9.1 (H) 07/08/2023    ASSESSMENT & PLAN:  Malignant  neoplasm of upper-outer quadrant of left breast in female, estrogen receptor negative (HCC) 06/29/2023: Left lumpectomy: 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, margins negative, 0/3 lymph nodes negative, ER 0%, PR 0%, HER2 3+ positive, Ki-67 15%  Pathology counseling: I discussed the final pathology report of the patient provided  a copy of this report. I discussed the margins as well as  lymph node surgeries. We also discussed the final staging along with previously performed ER/PR and HER-2/neu testing.  Treatment plan: Adjuvant chemotherapy with Taxol Herceptin followed by Herceptin maintenance for 1 year Adjuvant radiation therapy  Return to clinic in 3 weeks to start chemotherapy ------------------------------------- Assessment and Plan Assessment & Plan Malignant neoplasm of upper-outer quadrant of left breast Post-surgical follow-up for breast cancer. Tumor size 1.6 cm, no need for aggressive treatment. Chemotherapy planned for 12 weeks, followed by 4 weeks of radiation. Herceptin every three weeks for one year post-chemotherapy. Benadryl  IV may cause drowsiness. Anti-nausea medication and numbing cream prescribed. - Schedule chemotherapy to start on July 14th, 2025. - Administer Benadryl  IV as part of chemotherapy regimen. - Prescribe anti-nausea medication for use as needed. - Schedule radiation therapy to start one month after chemotherapy completion. - Arrange for Herceptin administration every three weeks post-chemotherapy for one year. - Provide chemo education session with pharmacist on July 2nd, 2025. - Prescribe numbing cream for port access.  Seroma post breast surgery Post-surgical seroma under the arm, likely a pocket of old blood. Not infected. Follow up with surgeon's PA for possible aspiration if needed. - Follow up with surgeon's PA for evaluation and possible aspiration of seroma.  Staphylococcus infection of surgical wound Staphylococcus infection in  surgical wound culture, likely contaminant from normal skin flora. Sensitive to Keflex, which she is currently taking. Infection not expected to interfere with chemotherapy schedule. - Continue Keflex as prescribed.      No orders of the defined types were placed in this encounter.  The patient has a good understanding of the overall plan. she agrees with it. she will call with any problems that may develop before the next visit here. Total time spent: 30 mins including face to face time and time spent for planning, charting and co-ordination of care   Viinay K Toniesha Zellner, MD 07/12/23

## 2023-07-12 NOTE — Assessment & Plan Note (Signed)
 06/29/2023: Left lumpectomy: 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, margins negative, 0/3 lymph nodes negative, ER 0%, PR 0%, HER2 3+ positive, Ki-67 15%  Pathology counseling: I discussed the final pathology report of the patient provided  a copy of this report. I discussed the margins as well as lymph node surgeries. We also discussed the final staging along with previously performed ER/PR and HER-2/neu testing.  Treatment plan: Adjuvant chemotherapy with Taxol Herceptin followed by Herceptin maintenance for 1 year Adjuvant radiation therapy  Return to clinic in 3 weeks to start chemotherapy

## 2023-07-12 NOTE — Telephone Encounter (Signed)
 Long term Disability was received via fax from [employer] 07/11/23. Will be placed in a folder and put inside of [name] NP box.

## 2023-07-13 ENCOUNTER — Other Ambulatory Visit: Payer: Self-pay

## 2023-07-13 ENCOUNTER — Encounter: Payer: Self-pay | Admitting: Hematology and Oncology

## 2023-07-14 ENCOUNTER — Encounter: Payer: Self-pay | Admitting: *Deleted

## 2023-07-14 NOTE — Telephone Encounter (Signed)
 Paperwork is in Mary Campbell's review folder. 07/14/2023. Awaiting signature

## 2023-07-17 ENCOUNTER — Encounter: Payer: Self-pay | Admitting: Hematology and Oncology

## 2023-07-18 ENCOUNTER — Encounter: Payer: Self-pay | Admitting: Hematology and Oncology

## 2023-07-18 NOTE — Progress Notes (Unsigned)
 [facility]       Telephone: [phone]?Fax: [phone]   Oncology Clinical Pharmacist Practitioner Initial Assessment  Mary Campbell is a 58 y.o. female with a diagnosis of breast cancer. They were contacted today via in-person visit.  Indication/Regimen Trastuzumab (Herceptin) and Paclitaxel (Taxol) are being used appropriately for treatment of breast cancer by Dr. Vinay Gudena.      Wt Readings from Last 1 Encounters:  07/12/23 176 lb 9.6 oz (80.1 kg)    Estimated body surface area is 1.9 meters squared as calculated from the following:   Height as of 07/12/23: 5' 4 (1.626 m).   Weight as of 07/12/23: 176 lb 9.6 oz (80.1 kg).  The dosing regimen is weekly (Day 1, Day 8, Day 15, Day 22) for 3 cycles  Trastuzumab (4 mg/kg load, 2 mg/kg maintenance) on Day 1 Paclitaxel (80 mg/m2) on Day 1  Followed by a dosing regimen that is every 21 days for 13 cycles (starting week 13)  Trastuzumab (6 mg/kg maintenance) on Day 1  Dose Modifications None   Allergies Allergies  Allergen Reactions   Other Other (See Comments)    Patient reports a history of difficult intubation   Codeine Hives   Lisinopril Cough   Sulfonamide Derivatives Hives    Vitals    07/12/2023    1:59 PM 07/09/2023    9:53 AM 07/08/2023   11:21 PM  Oncology Vitals  Height 163 cm    Weight 80.105 kg    Weight (lbs) 176 lbs 10 oz    BMI 30.31 kg/m2    Temp 98.5 F (36.9 C) 98.1 F (36.7 C) 98.6 F (37 C)  Pulse Rate 99 80 69  BP 140/50 150/61 155/59  Resp 18 18 18   SpO2 98 % 95 % 94 %  BSA (m2) 1.9 m2       Laboratory Data    Latest Ref Rng & Units 07/08/2023    8:04 PM 06/07/2023   12:24 PM 11/23/2020   11:25 AM  CBC EXTENDED  WBC 4.0 - 10.5 K/uL 13.3  10.5  12.6   RBC 3.87 - 5.11 MIL/uL 3.44  4.36  4.38   Hemoglobin 12.0 - 15.0 g/dL 89.7  87.2  87.5   HCT 36.0 - 46.0 % 31.2  37.7  36.6   Platelets 150 - 400 K/uL 328  293  326   NEUT# 1.7 - 7.7 K/uL 9.1  8.1  9.5    Lymph# 0.7 - 4.0 K/uL 3.3  1.7  2.4        Latest Ref Rng & Units 07/08/2023    8:04 PM 06/07/2023   12:24 PM 11/23/2020   11:25 AM  CMP  Glucose 70 - 99 mg/dL 880  813  867   BUN 6 - 20 mg/dL 11  9  12    Creatinine 0.44 - 1.00 mg/dL 8.86  9.03  8.99   Sodium 135 - 145 mmol/L 137  138  132   Potassium 3.5 - 5.1 mmol/L 3.2  4.1  4.0   Chloride 98 - 111 mmol/L 102  103  97   CO2 22 - 32 mmol/L 23  30  27    Calcium  8.9 - 10.3 mg/dL 8.7  9.1  9.2   Total Protein 6.5 - 8.1 g/dL 6.5  6.9  7.9   Total Bilirubin 0.0 - 1.2 mg/dL 0.3  0.3  0.4   Alkaline Phos 38 - 126 U/L 135  159  147  AST 15 - 41 U/L 17  10  17    ALT 0 - 44 U/L 11  8  12     Contraindications Contraindications were reviewed? Yes Contraindications to therapy were identified? No   Safety Precautions (written information also provided) The following safety precautions for the use of trastuzumab + paclitaxel were reviewed:  Fever: reviewed the importance of having a thermometer and the Centers for Disease Control and Prevention (CDC) definition of fever which is 100.33F (38C) or higher. Patient should call 24/7 triage at [phone] if experiencing a fever or any other symptoms Decreased white blood cells (WBCs) and increased risk for infection Decreased platelet count and increased risk of bleeding Decreased hemoglobin, part of the red blood cells that carry iron and oxygen Nausea or vomiting Diarrhea or constipation Hair Loss Fatigue Changes in liver function Peripheral Neuropathy Muscle or joint pain or weakness Mouth Irritation or sores Nail Changes Hypersensitivity reactions Cardiotoxicity Pneumonitis Irritant Headache Handling body fluids and waste Intimacy, sexual activity, contraception, and fertility  Medication Reconciliation Current Outpatient Medications  Medication Sig Dispense Refill   acetaminophen  (TYLENOL ) 650 MG CR tablet Take 1,300 mg by mouth in the morning, at noon, and at bedtime.      albuterol  (VENTOLIN  HFA) 108 (90 Base) MCG/ACT inhaler Inhale 2 puffs into the lungs every 6 (six) hours as needed for wheezing or shortness of breath. 8 g 2   amLODipine-benazepril (LOTREL) 5-20 MG capsule Take 1 capsule by mouth in the morning.     aspirin  EC 81 MG tablet Take 81 mg by mouth in the morning.     buPROPion (WELLBUTRIN XL) 150 MG 24 hr tablet Take 150 mg by mouth in the morning.     clobetasol cream (TEMOVATE) 0.05 % Apply 1 Application topically 2 (two) times daily as needed (skin irritation).     clopidogrel  (PLAVIX ) 75 MG tablet Take 1 tablet (75 mg total) by mouth daily. 90 tablet 1   DULoxetine (CYMBALTA) 30 MG capsule Take 30 mg by mouth in the morning.     DULoxetine (CYMBALTA) 60 MG capsule Take 60 mg by mouth in the morning.     fluticasone  (FLONASE ) 50 MCG/ACT nasal spray Place 1-2 sprays into both nostrils daily. 18.2 mL 2   gabapentin  (NEURONTIN ) 100 MG capsule Take 300 mg by mouth 2 (two) times daily.     JANUMET XR 50-1000 MG TB24 Take 1 tablet by mouth in the morning and at bedtime.     LANTUS  SOLOSTAR 100 UNIT/ML Solostar Pen Inject 50 Units into the skin at bedtime.     lidocaine -prilocaine  (EMLA ) cream Apply to affected area once 30 g 3   nicotine (NICODERM CQ - DOSED IN MG/24 HOURS) 21 mg/24hr patch Place 1 patch onto the skin daily.     ondansetron  (ZOFRAN ) 8 MG tablet Take 1 tablet (8 mg total) by mouth every 8 (eight) hours as needed for nausea or vomiting. 30 tablet 1   prochlorperazine  (COMPAZINE ) 10 MG tablet Take 1 tablet (10 mg total) by mouth every 6 (six) hours as needed for nausea or vomiting. 30 tablet 1   rosuvastatin  (CRESTOR ) 40 MG tablet Take 1 tablet (40 mg total) by mouth daily. (Patient taking differently: Take 40 mg by mouth at bedtime.) 90 tablet 3   Tiotropium Bromide-Olodaterol (STIOLTO RESPIMAT ) 2.5-2.5 MCG/ACT AERS Inhale 2 puffs into the lungs daily. 4 g 11   traMADol  (ULTRAM ) 50 MG tablet Take 1 tablet (50 mg total) by mouth every 6  (  six) hours as needed. 15 tablet 0   Current Facility-Administered Medications  Medication Dose Route Frequency Provider Last Rate Last Admin   sodium chloride  flush (NS) 0.9 % injection 3 mL  3 mL Intravenous Q12H Court Dorn PARAS, MD       sodium chloride  flush (NS) 0.9 % injection 3 mL  3 mL Intravenous Q12H Court Dorn PARAS, MD        Medication reconciliation is based on the patient's most recent medication list in the electronic medical record (EMR) including herbal products and OTC medications.   The patient's medication list was reviewed today with the patient? Yes   Drug-drug interactions (DDIs) DDIs were evaluated? Yes Significant DDIs identified? Yes, informed Dr. Gudena about potential interaction with clopidogrel  and paclitaxel where paclitaxel concentrations may be increased. Prasugrel appears to be a safer alternative.   Drug-Food Interactions Drug-food interactions were evaluated? Yes Drug-food interactions identified? No   Follow-up Plan  Treatment start date: 07/31/23 planned, not scheduled yet. Scheduler is aware Port placement date: 06/29/23 ECHO date: 07/18/23 We reviewed the prescriptions, premedications, and treatment regimen with the patient. Possible side effects of the treatment regimen were reviewed and management strategies were discussed  *** Clinical pharmacy will assist Dr. Vinay Gudena and Mary Campbell on an as needed basis going forward  Mary Campbell participated in the discussion, expressed understanding, and voiced agreement with the above plan. All questions were answered to her satisfaction. The patient was advised to contact the clinic at [phone] with any questions or concerns prior to her return visit.   I spent 60 minutes assessing the patient.  Dynver Clemson A. Lucila, PharmD, BCOP, CPP  Norleen DELENA Lucila, RPH-CPP, 07/18/2023 10:11 AM  **Disclaimer: This note was dictated with voice recognition software. Similar sounding words can inadvertently  be transcribed and this note may contain transcription errors which may not have been corrected upon publication of note.**

## 2023-07-19 ENCOUNTER — Inpatient Hospital Stay

## 2023-07-19 ENCOUNTER — Inpatient Hospital Stay: Attending: Hematology and Oncology | Admitting: Pharmacist

## 2023-07-19 DIAGNOSIS — K5900 Constipation, unspecified: Secondary | ICD-10-CM | POA: Insufficient documentation

## 2023-07-19 DIAGNOSIS — E538 Deficiency of other specified B group vitamins: Secondary | ICD-10-CM | POA: Insufficient documentation

## 2023-07-19 DIAGNOSIS — Z5111 Encounter for antineoplastic chemotherapy: Secondary | ICD-10-CM | POA: Diagnosis present

## 2023-07-19 DIAGNOSIS — Z79899 Other long term (current) drug therapy: Secondary | ICD-10-CM | POA: Insufficient documentation

## 2023-07-19 DIAGNOSIS — Z171 Estrogen receptor negative status [ER-]: Secondary | ICD-10-CM | POA: Diagnosis not present

## 2023-07-19 DIAGNOSIS — C50412 Malignant neoplasm of upper-outer quadrant of left female breast: Secondary | ICD-10-CM | POA: Diagnosis present

## 2023-07-19 DIAGNOSIS — D509 Iron deficiency anemia, unspecified: Secondary | ICD-10-CM | POA: Insufficient documentation

## 2023-07-19 NOTE — Research (Signed)
 [id number], ICE COMPRESS: RANDOMIZED TRIAL OF LIMB CRYOCOMPRESSION VERSUS CONTINUOUS COMPRESSION VERSUS LOW CYCLIC COMPRESSION FOR THE PREVENTION OF TAXANE-INDUCED PERIPHERAL NEUROPATHY  Patient Mary Campbell was identified by Dr. Gudena as a potential candidate for the above listed study.  This Clinical Research Coordinator met with Anastazia Creek, [id number], on 07/19/23 in a manner and location that ensures patient privacy to discuss participation in the above listed research study.  Patient is Accompanied by her daughter.  A copy of the informed consent document and separate HIPAA Authorization was provided to the patient in person and previously via email on 07/11/24.  Patient reads, speaks, and understands [place].  Pt confirmed that she has already reviewed the documents. Patient was provided with the business card of this Coordinator and encouraged to contact the research team with any questions.  Approximately 15 minutes were spent with the patient reviewing the informed consent documents.  Patient was provided the option of taking informed consent documents home to review and was encouraged to review at their convenience with their support network, including other care providers. Pt stated she already has the documents via email and can review them at her convenience. All questions were answered to the patient's satisfaction. The patient declined participation in the study. She was thanked for her time and interest and was reminded that she may contact the research team at any time with questions or concerns.  Geneveive Furness, Ph.D. Clinical Research Coordinator [phone] 07/19/2023 11:06 AM

## 2023-07-20 ENCOUNTER — Encounter: Payer: Self-pay | Admitting: Physician Assistant

## 2023-07-20 ENCOUNTER — Encounter: Payer: Self-pay | Admitting: *Deleted

## 2023-07-20 NOTE — Telephone Encounter (Signed)
 Patient states to disregard form.

## 2023-07-24 NOTE — Progress Notes (Signed)
 Pharmacist Chemotherapy Monitoring - Initial Assessment    Anticipated start date: 07/31/23   The following has been reviewed per standard work regarding the patient's treatment regimen: The patient's diagnosis, treatment plan and drug doses, and organ/hematologic function Lab orders and baseline tests specific to treatment regimen  The treatment plan start date, drug sequencing, and pre-medications Prior authorization status  Patient's documented medication list, including drug-drug interaction screen and prescriptions for anti-emetics and supportive care specific to the treatment regimen The drug concentrations, fluid compatibility, administration routes, and timing of the medications to be used The patient's access for treatment and lifetime cumulative dose history, if applicable  The patient's medication allergies and previous infusion related reactions, if applicable   Changes made to treatment plan:  N/A  Follow up needed:  Pending authorization for treatment  and port placement   Mary Campbell, PharmD, MBA

## 2023-07-26 ENCOUNTER — Other Ambulatory Visit (INDEPENDENT_AMBULATORY_CARE_PROVIDER_SITE_OTHER)

## 2023-07-26 ENCOUNTER — Encounter: Payer: Self-pay | Admitting: Gastroenterology

## 2023-07-26 ENCOUNTER — Ambulatory Visit: Admitting: Gastroenterology

## 2023-07-26 VITALS — BP 129/72 | HR 83 | Temp 97.5°F | Resp 16 | Ht 64.0 in | Wt 171.0 lb

## 2023-07-26 DIAGNOSIS — K644 Residual hemorrhoidal skin tags: Secondary | ICD-10-CM

## 2023-07-26 DIAGNOSIS — Z1211 Encounter for screening for malignant neoplasm of colon: Secondary | ICD-10-CM

## 2023-07-26 DIAGNOSIS — K635 Polyp of colon: Secondary | ICD-10-CM

## 2023-07-26 DIAGNOSIS — K5730 Diverticulosis of large intestine without perforation or abscess without bleeding: Secondary | ICD-10-CM

## 2023-07-26 DIAGNOSIS — Z862 Personal history of diseases of the blood and blood-forming organs and certain disorders involving the immune mechanism: Secondary | ICD-10-CM | POA: Diagnosis not present

## 2023-07-26 DIAGNOSIS — D175 Benign lipomatous neoplasm of intra-abdominal organs: Secondary | ICD-10-CM | POA: Diagnosis not present

## 2023-07-26 DIAGNOSIS — R195 Other fecal abnormalities: Secondary | ICD-10-CM

## 2023-07-26 DIAGNOSIS — K641 Second degree hemorrhoids: Secondary | ICD-10-CM

## 2023-07-26 DIAGNOSIS — D122 Benign neoplasm of ascending colon: Secondary | ICD-10-CM

## 2023-07-26 DIAGNOSIS — K6389 Other specified diseases of intestine: Secondary | ICD-10-CM | POA: Diagnosis not present

## 2023-07-26 LAB — CBC
HCT: 35 % — ABNORMAL LOW (ref 36.0–46.0)
Hemoglobin: 11.8 g/dL — ABNORMAL LOW (ref 12.0–15.0)
MCHC: 33.8 g/dL (ref 30.0–36.0)
MCV: 88.6 fl (ref 78.0–100.0)
Platelets: 268 K/uL (ref 150.0–400.0)
RBC: 3.95 Mil/uL (ref 3.87–5.11)
RDW: 17.9 % — ABNORMAL HIGH (ref 11.5–15.5)
WBC: 9.4 K/uL (ref 4.0–10.5)

## 2023-07-26 LAB — B12 AND FOLATE PANEL
Folate: 10.6 ng/mL (ref >5.9)
Vitamin B-12: 155 pg/mL — ABNORMAL LOW (ref 211–911)

## 2023-07-26 MED ORDER — DEXTROSE 5 % IV SOLN: INTRAVENOUS | Status: AC

## 2023-07-26 MED ORDER — SODIUM CHLORIDE 0.9 % IV SOLN: 500.0000 mL | Freq: Once | INTRAVENOUS | Status: DC

## 2023-07-26 NOTE — Progress Notes (Unsigned)
 Sedate, gd SR, tolerated procedure well, VSS, report to RN

## 2023-07-26 NOTE — Progress Notes (Unsigned)
 GASTROENTEROLOGY PROCEDURE H&P NOTE   Primary Care Physician: System, Provider Not In  HPI: Mary Campbell is a 58 y.o. female who presents for Colonoscopy for positive Cologuard.  Past Medical History:  Diagnosis Date   Anxiety    Arthritis    Breast cancer (HCC)    left   Chronic back pain    COPD (chronic obstructive pulmonary disease) (HCC)    Diabetes mellitus    Difficult intubation    Diverticulitis    Dyspnea    with exertion   Fibromyalgia    Hyperlipidemia    Hypertension    PAD (peripheral artery disease) (HCC)    a. PV angio 10/2018 - bilateral SFA disease s/p L intervention.   PAD (peripheral artery disease) (HCC)    PONV (postoperative nausea and vomiting)    Sleep apnea    Tobacco abuse    Past Surgical History:  Procedure Laterality Date   ABDOMINAL AORTOGRAM W/LOWER EXTREMITY Right 10/18/2018   Procedure: ABDOMINAL AORTOGRAM W/LOWER EXTREMITY;  Surgeon: Court Dorn PARAS, MD;  Location: [facility];  Service: Cardiovascular;  Laterality: Right;   ABDOMINAL AORTOGRAM W/LOWER EXTREMITY N/A 04/02/2020   Procedure: ABDOMINAL AORTOGRAM W/LOWER EXTREMITY;  Surgeon: Court Dorn PARAS, MD;  Location: [facility];  Service: Cardiovascular;  Laterality: N/A;   ABDOMINAL AORTOGRAM W/LOWER EXTREMITY N/A 04/23/2020   Procedure: ABDOMINAL AORTOGRAM W/LOWER EXTREMITY;  Surgeon: Court Dorn PARAS, MD;  Location: [facility];  Service: Cardiovascular;  Laterality: N/A;   ABDOMINAL HYSTERECTOMY     BREAST BIOPSY Left 05/31/2023   US  LT BREAST BX W LOC DEV 1ST LESION IMG BX SPEC US  GUIDE 05/31/2023 [facility]   BREAST BIOPSY  06/27/2023   MM LT RADIOACTIVE SEED LOC MAMMO GUIDE 06/27/2023 [facility]   BREAST LUMPECTOMY WITH RADIOACTIVE SEED AND SENTINEL LYMPH NODE BIOPSY Left 06/29/2023   Procedure: BREAST LUMPECTOMY WITH RADIOACTIVE SEED AND SENTINEL LYMPH NODE BIOPSY;  Surgeon: Curvin Deward MOULD, MD;  Location: [facility];  Service: General;   Laterality: Left;  LEFT BREAST RADIOACTIVE SEED LOCALIZED LUMPECTOMY   SENTINEL NODE BIOPSY   CHOLECYSTECTOMY     PERIPHERAL VASCULAR ATHERECTOMY Left 10/18/2018   Procedure: PERIPHERAL VASCULAR ATHERECTOMY;  Surgeon: Court Dorn PARAS, MD;  Location: [facility];  Service: Cardiovascular;  Laterality: Left;  SFA   PERIPHERAL VASCULAR ATHERECTOMY Right 11/22/2018   Procedure: PERIPHERAL VASCULAR ATHERECTOMY;  Surgeon: Court Dorn PARAS, MD;  Location: [facility] CV [facility];  Service: Cardiovascular;  Laterality: Right;  SFA   PERIPHERAL VASCULAR ATHERECTOMY  04/23/2020   Procedure: PERIPHERAL VASCULAR ATHERECTOMY;  Surgeon: Court Dorn PARAS, MD;  Location: [facility] INVASIVE CV LAB;  Service: Cardiovascular;;   PERIPHERAL VASCULAR BALLOON ANGIOPLASTY Right 11/22/2018   Procedure: PERIPHERAL VASCULAR BALLOON ANGIOPLASTY;  Surgeon: Court Dorn PARAS, MD;  Location: [facility];  Service: Cardiovascular;  Laterality: Right;  SFA   PERIPHERAL VASCULAR BALLOON ANGIOPLASTY  04/23/2020   Procedure: PERIPHERAL VASCULAR BALLOON ANGIOPLASTY;  Surgeon: Court Dorn PARAS, MD;  Location: [facility];  Service: Cardiovascular;;   PERIPHERAL VASCULAR INTERVENTION Left 04/02/2020   Procedure: PERIPHERAL VASCULAR INTERVENTION;  Surgeon: Court Dorn PARAS, MD;  Location: [facility];  Service: Cardiovascular;  Laterality: Left;   PORTACATH PLACEMENT N/A 06/29/2023   Procedure: INSERTION, TUNNELED CENTRAL VENOUS DEVICE, WITH PORT;  Surgeon: Curvin Deward MOULD, MD;  Location: [facility];  Service: General;  Laterality: N/A;  PORT PLACEMENT WITH ULTRASOUND GUIDANCE GEN w/PEC BLOCK  Current Outpatient Medications  Medication Sig Dispense Refill   acetaminophen  (TYLENOL ) 650 MG CR tablet Take 1,300 mg by mouth in the morning, at noon, and at bedtime.     albuterol  (VENTOLIN  HFA) 108 (90 Base) MCG/ACT inhaler Inhale 2 puffs into the lungs every 6 (six) hours as needed for wheezing or shortness of breath. 8 g 2    amLODipine-benazepril (LOTREL) 5-20 MG capsule Take 1 capsule by mouth in the morning.     aspirin  EC 81 MG tablet Take 81 mg by mouth in the morning.     buPROPion (WELLBUTRIN XL) 150 MG 24 hr tablet Take 150 mg by mouth in the morning.     clobetasol cream (TEMOVATE) 0.05 % Apply 1 Application topically 2 (two) times daily as needed (skin irritation).     clopidogrel  (PLAVIX ) 75 MG tablet Take 1 tablet (75 mg total) by mouth daily. 90 tablet 1   DULoxetine (CYMBALTA) 30 MG capsule Take 30 mg by mouth in the morning. Total dose 90 mg (30 mg and 60 mg)     DULoxetine (CYMBALTA) 60 MG capsule Take 60 mg by mouth in the morning. Total dose 90 mg (30 mg and 60 mg)     fluticasone  (FLONASE ) 50 MCG/ACT nasal spray Place 1-2 sprays into both nostrils daily. 18.2 mL 2   gabapentin  (NEURONTIN ) 100 MG capsule Take 300 mg by mouth 2 (two) times daily.     JANUMET XR 50-1000 MG TB24 Take 1 tablet by mouth in the morning and at bedtime.     LANTUS  SOLOSTAR 100 UNIT/ML Solostar Pen Inject 50 Units into the skin at bedtime.     lidocaine -prilocaine  (EMLA ) cream Apply to affected area once 30 g 3   nicotine (NICODERM CQ - DOSED IN MG/24 HOURS) 21 mg/24hr patch Place 1 patch onto the skin daily.     ondansetron  (ZOFRAN ) 8 MG tablet Take 1 tablet (8 mg total) by mouth every 8 (eight) hours as needed for nausea or vomiting. 30 tablet 1   prochlorperazine  (COMPAZINE ) 10 MG tablet Take 1 tablet (10 mg total) by mouth every 6 (six) hours as needed for nausea or vomiting. 30 tablet 1   rosuvastatin  (CRESTOR ) 40 MG tablet Take 1 tablet (40 mg total) by mouth daily. (Patient taking differently: Take 40 mg by mouth at bedtime.) 90 tablet 3   Tiotropium Bromide-Olodaterol (STIOLTO RESPIMAT ) 2.5-2.5 MCG/ACT AERS Inhale 2 puffs into the lungs daily. 4 g 11   Current Facility-Administered Medications  Medication Dose Route Frequency Provider Last Rate Last Admin   0.9 %  sodium chloride  infusion  500 mL Intravenous Once  Mansouraty, Shambria Camerer Jr., MD       dextrose  5 % solution   Intravenous Continuous Mansouraty, Aloha Raddle., MD       sodium chloride  flush (NS) 0.9 % injection 3 mL  3 mL Intravenous Q12H Court Dorn PARAS, MD       sodium chloride  flush (NS) 0.9 % injection 3 mL  3 mL Intravenous Q12H Court Dorn PARAS, MD        Current Outpatient Medications:    acetaminophen  (TYLENOL ) 650 MG CR tablet, Take 1,300 mg by mouth in the morning, at noon, and at bedtime., Disp: , Rfl:    albuterol  (VENTOLIN  HFA) 108 (90 Base) MCG/ACT inhaler, Inhale 2 puffs into the lungs every 6 (six) hours as needed for wheezing or shortness of breath., Disp: 8 g, Rfl: 2   amLODipine-benazepril (LOTREL) 5-20 MG capsule, Take 1 capsule  by mouth in the morning., Disp: , Rfl:    aspirin  EC 81 MG tablet, Take 81 mg by mouth in the morning., Disp: , Rfl:    buPROPion (WELLBUTRIN XL) 150 MG 24 hr tablet, Take 150 mg by mouth in the morning., Disp: , Rfl:    clobetasol cream (TEMOVATE) 0.05 %, Apply 1 Application topically 2 (two) times daily as needed (skin irritation)., Disp: , Rfl:    clopidogrel  (PLAVIX ) 75 MG tablet, Take 1 tablet (75 mg total) by mouth daily., Disp: 90 tablet, Rfl: 1   DULoxetine (CYMBALTA) 30 MG capsule, Take 30 mg by mouth in the morning. Total dose 90 mg (30 mg and 60 mg), Disp: , Rfl:    DULoxetine (CYMBALTA) 60 MG capsule, Take 60 mg by mouth in the morning. Total dose 90 mg (30 mg and 60 mg), Disp: , Rfl:    fluticasone  (FLONASE ) 50 MCG/ACT nasal spray, Place 1-2 sprays into both nostrils daily., Disp: 18.2 mL, Rfl: 2   gabapentin  (NEURONTIN ) 100 MG capsule, Take 300 mg by mouth 2 (two) times daily., Disp: , Rfl:    JANUMET XR 50-1000 MG TB24, Take 1 tablet by mouth in the morning and at bedtime., Disp: , Rfl:    LANTUS  SOLOSTAR 100 UNIT/ML Solostar Pen, Inject 50 Units into the skin at bedtime., Disp: , Rfl:    lidocaine -prilocaine  (EMLA ) cream, Apply to affected area once, Disp: 30 g, Rfl: 3   nicotine  (NICODERM CQ - DOSED IN MG/24 HOURS) 21 mg/24hr patch, Place 1 patch onto the skin daily., Disp: , Rfl:    ondansetron  (ZOFRAN ) 8 MG tablet, Take 1 tablet (8 mg total) by mouth every 8 (eight) hours as needed for nausea or vomiting., Disp: 30 tablet, Rfl: 1   prochlorperazine  (COMPAZINE ) 10 MG tablet, Take 1 tablet (10 mg total) by mouth every 6 (six) hours as needed for nausea or vomiting., Disp: 30 tablet, Rfl: 1   rosuvastatin  (CRESTOR ) 40 MG tablet, Take 1 tablet (40 mg total) by mouth daily. (Patient taking differently: Take 40 mg by mouth at bedtime.), Disp: 90 tablet, Rfl: 3   Tiotropium Bromide-Olodaterol (STIOLTO RESPIMAT ) 2.5-2.5 MCG/ACT AERS, Inhale 2 puffs into the lungs daily., Disp: 4 g, Rfl: 11  Current Facility-Administered Medications:    0.9 %  sodium chloride  infusion, 500 mL, Intravenous, Once, Mansouraty, Wiktoria Hemrick Jr., MD   dextrose  5 % solution, , Intravenous, Continuous, Mansouraty, Aloha Raddle., MD   sodium chloride  flush (NS) 0.9 % injection 3 mL, 3 mL, Intravenous, Q12H, Court Dorn PARAS, MD   sodium chloride  flush (NS) 0.9 % injection 3 mL, 3 mL, Intravenous, Q12H, Court Dorn PARAS, MD Allergies  Allergen Reactions   Codeine Hives   Other Other (See Comments)    Patient reports a history of difficult intubation   Sulfonamide Derivatives Hives   Lisinopril Cough   Family History  Problem Relation Age of Onset   Diabetes Mother    Hypertension Mother    Hyperlipidemia Mother    Heart attack Father    Heart disease Father    Hypertension Father    Hyperlipidemia Father    Breast cancer Neg Hx    Colon cancer Neg Hx    Esophageal cancer Neg Hx    Social History   Socioeconomic History   Marital status: Legally Separated    Spouse name: Not on file   Number of children: 2   Years of education: Not on file   Highest education level: Not on  file  Occupational History   Occupation: disabled  Tobacco Use   Smoking status: Some Days    Current packs/day:  0.00    Types: Cigarettes    Last attempt to quit: 08/11/2022    Years since quitting: 0.9   Smokeless tobacco: Never   Tobacco comments:    Trying quit with patch  Vaping Use   Vaping status: Never Used  Substance and Sexual Activity   Alcohol use: No   Drug use: No   Sexual activity: Yes    Birth control/protection: Surgical  Other Topics Concern   Not on file  Social History Narrative   Not on file   Social Drivers of Health   Financial Resource Strain: Not on file  Food Insecurity: No Food Insecurity (06/07/2023)   Hunger Vital Sign    Worried About Running Out of Food in the Last Year: Never true    Ran Out of Food in the Last Year: Never true  Transportation Needs: No Transportation Needs (06/07/2023)   PRAPARE - Administrator, Civil Service (Medical): No    Lack of Transportation (Non-Medical): No  Physical Activity: Not on file  Stress: Not on file  Social Connections: Not on file  Intimate Partner Violence: Not At Risk (06/07/2023)   Humiliation, Afraid, Rape, and Kick questionnaire    Fear of Current or Ex-Partner: No    Emotionally Abused: No    Physically Abused: No    Sexually Abused: No    Physical Exam: Today's Vitals   07/26/23 0800  BP: (!) 146/64  Pulse: 75  Temp: (!) 97.5 F (36.4 C)  TempSrc: Temporal  SpO2: 96%  Weight: 171 lb (77.6 kg)  Height: 5' 4 (1.626 m)   Body mass index is 29.35 kg/m. GEN: NAD EYE: Sclerae anicteric ENT: MMM CV: Non-tachycardic GI: Soft, NT/ND NEURO:  Alert & Oriented x 3  Lab Results: No results for input(s): WBC, HGB, HCT, PLT in the last 72 hours. BMET No results for input(s): NA, K, CL, CO2, GLUCOSE, BUN, CREATININE, CALCIUM  in the last 72 hours. LFT No results for input(s): PROT, ALBUMIN, AST, ALT, ALKPHOS, BILITOT, BILIDIR, IBILI in the last 72 hours. PT/INR No results for input(s): LABPROT, INR in the last 72 hours.   Impression /  Plan: This is a 58 y.o.female who presents for Colonoscopy for positive Cologuard.  The risks and benefits of endoscopic evaluation/treatment were discussed with the patient and/or family; these include but are not limited to the risk of perforation, infection, bleeding, missed lesions, lack of diagnosis, severe illness requiring hospitalization, as well as anesthesia and sedation related illnesses.  The patient's history has been reviewed, patient examined, no change in status, and deemed stable for procedure.  The patient and/or family is agreeable to proceed.    Aloha Finner, MD [facility] Office # [phone]

## 2023-07-26 NOTE — Op Note (Addendum)
 [facility] Patient Name: Mary Campbell Procedure Date: 07/26/2023 9:01 AM MRN: [id number] Endoscopist: Aloha Finner , MD, [id number] Age: 58 Referring MD:  Date of Birth: 05-08-1965 Gender: Female Account #: [id number] Procedure:                Colonoscopy Indications:              Positive Cologuard test Medicines:                Monitored Anesthesia Care Procedure:                Pre-Anesthesia Assessment:                           - Prior to the procedure, a History and Physical                            was performed, and patient medications and                            allergies were reviewed. The patient's tolerance of                            previous anesthesia was also reviewed. The risks                            and benefits of the procedure and the sedation                            options and risks were discussed with the patient.                            All questions were answered, and informed consent                            was obtained. Prior Anticoagulants: The patient has                            taken Plavix  (clopidogrel ), last dose was 5 days                            prior to procedure. ASA Grade Assessment: III - A                            patient with severe systemic disease. After                            reviewing the risks and benefits, the patient was                            deemed in satisfactory condition to undergo the                            procedure.  After obtaining informed consent, the colonoscope                            was passed under direct vision. Throughout the                            procedure, the patient's blood pressure, pulse, and                            oxygen saturations were monitored continuously. The                            CF [id number] #[id number] was introduced through the anus                            and advanced to the the cecum, identified by                             appendiceal orifice and ileocecal valve. The                            colonoscopy was performed without difficulty. The                            patient tolerated the procedure. The quality of the                            bowel preparation was adequate. The ileocecal                            valve, appendiceal orifice, and rectum were                            photographed. Scope In: 9:09:43 AM Scope Out: 9:28:47 AM Scope Withdrawal Time: 0 hours 14 minutes 40 seconds  Total Procedure Duration: 0 hours 19 minutes 4 seconds  Findings:                 The digital rectal exam findings include                            hemorrhoids. Pertinent negatives include no                            palpable rectal lesions.                           A large amount of semi-liquid stool was found in                            the entire colon, interfering with visualization.                            Lavage of the area was performed using copious  amounts, resulting in clearance with adequate                            visualization.                           Two sessile polyps were found in the recto-sigmoid                            colon and hepatic flexure. The polyps were 2 to 3                            mm in size. These polyps were removed with a cold                            snare. Resection and retrieval were complete.                           There was a small lipoma, in the transverse colon.                           Multiple small-mouthed diverticula were found in                            the recto-sigmoid colon and sigmoid colon.                           Normal mucosa was found in the entire colon                            otherwise.                           Non-bleeding non-thrombosed external and internal                            hemorrhoids were found during retroflexion, during                            perianal exam and  during digital exam. The                            hemorrhoids were Grade II (internal hemorrhoids                            that prolapse but reduce spontaneously). Complications:            No immediate complications. Estimated Blood Loss:     Estimated blood loss was minimal. Impression:               - Hemorrhoids found on digital rectal exam.                           - Stool in the entire examined colon.                           -  Two 2 to 3 mm polyps at the recto-sigmoid colon                            and at the hepatic flexure, removed with a cold                            snare. Resected and retrieved.                           - Small lipoma noted in transverse colon region.                           - Diverticulosis in the recto-sigmoid colon and in                            the sigmoid colon.                           - Normal mucosa in the entire examined colon                            otherwise.                           - Non-bleeding non-thrombosed external and internal                            hemorrhoids. Recommendation:           - The patient will be observed post-procedure,                            until all discharge criteria are met.                           - Discharge patient to home.                           - Patient has a contact number available for                            emergencies. The signs and symptoms of potential                            delayed complications were discussed with the                            patient. Return to normal activities tomorrow.                            Written discharge instructions were provided to the                            patient.                           -  High fiber diet.                           - Use FiberCon 1-2 tablets PO daily.                           - Plavix  restart on 7/10 AM.                           - Continue present medications.                           - Await pathology  results.                           - Repeat colonoscopy in 5-10 years for surveillance                            based on pathology results.                           - Recommend patient have                            CBC/iron/TIBC/ferritin/B12/folate levels checked in                            the setting of her anemia. Should she be found to                            have iron deficiency or significant B12 deficiency,                            patient will be recommended an upper endoscopy.                           - The findings and recommendations were discussed                            with the patient.                           - The findings and recommendations were discussed                            with the patient's family. Aloha Finner, MD 07/26/2023 9:44:25 AM

## 2023-07-26 NOTE — Progress Notes (Unsigned)
 Called to room to assist during endoscopic procedure.  Patient ID and intended procedure confirmed with present staff. Received instructions for my participation in the procedure from the performing physician.

## 2023-07-26 NOTE — Patient Instructions (Addendum)
 Educational handout provided to patient related to Hemorrhoids, Polyps, and Diverticulosis, high fiber diet  High fiber diet  RESTART PLAVIX  tomorrow on 07/27/2023  Labs drawn today  Awaiting pathology results  YOU HAD AN ENDOSCOPIC PROCEDURE TODAY AT THE [facility]:   Refer to the procedure report that was given to you for any specific questions about what was found during the examination.  If the procedure report does not answer your questions, please call your gastroenterologist to clarify.  If you requested that your care partner not be given the details of your procedure findings, then the procedure report has been included in a sealed envelope for you to review at your convenience later.  YOU SHOULD EXPECT: Some feelings of bloating in the abdomen. Passage of more gas than usual.  Walking can help get rid of the air that was put into your GI tract during the procedure and reduce the bloating. If you had a lower endoscopy (such as a colonoscopy or flexible sigmoidoscopy) you may notice spotting of blood in your stool or on the toilet paper. If you underwent a bowel prep for your procedure, you may not have a normal bowel movement for a few days.  Please Note:  You might notice some irritation and congestion in your nose or some drainage.  This is from the oxygen used during your procedure.  There is no need for concern and it should clear up in a day or so.  SYMPTOMS TO REPORT IMMEDIATELY:  Following lower endoscopy (colonoscopy or flexible sigmoidoscopy):  Excessive amounts of blood in the stool  Significant tenderness or worsening of abdominal pains  Swelling of the abdomen that is new, acute  Fever of 100F or higher  For urgent or emergent issues, a gastroenterologist can be reached at any hour by calling [phone]. Do not use MyChart messaging for urgent concerns.    DIET:  We do recommend a small meal at first, but then you may proceed to your regular diet.   Drink plenty of fluids but you should avoid alcoholic beverages for 24 hours.  ACTIVITY:  You should plan to take it easy for the rest of today and you should NOT DRIVE or use heavy machinery until tomorrow (because of the sedation medicines used during the test).    FOLLOW UP: Our staff will call the number listed on your records the next business day following your procedure.  We will call around 7:15- 8:00 am to check on you and address any questions or concerns that you may have regarding the information given to you following your procedure. If we do not reach you, we will leave a message.     If any biopsies were taken you will be contacted by phone or by letter within the next 1-3 weeks.  Please call us  at [phone] if you have not heard about the biopsies in 3 weeks.    SIGNATURES/CONFIDENTIALITY: You and/or your care partner have signed paperwork which will be entered into your electronic medical record.  These signatures attest to the fact that that the information above on your After Visit Summary has been reviewed and is understood.  Full responsibility of the confidentiality of this discharge information lies with you and/or your care-partner.

## 2023-07-27 ENCOUNTER — Telehealth: Payer: Self-pay

## 2023-07-27 ENCOUNTER — Other Ambulatory Visit: Payer: Self-pay

## 2023-07-27 ENCOUNTER — Ambulatory Visit: Payer: Self-pay | Admitting: Gastroenterology

## 2023-07-27 ENCOUNTER — Ambulatory Visit
Admission: RE | Admit: 2023-07-27 | Discharge: 2023-07-27 | Disposition: A | Discharge status: Home / Self Care | Source: Ambulatory Visit | Attending: Physician Assistant | Admitting: Physician Assistant

## 2023-07-27 DIAGNOSIS — Z87891 Personal history of nicotine dependence: Secondary | ICD-10-CM

## 2023-07-27 DIAGNOSIS — E538 Deficiency of other specified B group vitamins: Secondary | ICD-10-CM

## 2023-07-27 DIAGNOSIS — D509 Iron deficiency anemia, unspecified: Secondary | ICD-10-CM

## 2023-07-27 LAB — IRON,TIBC AND FERRITIN PANEL
%SAT: 13 % — ABNORMAL LOW (ref 16–45)
Ferritin: 34 ng/mL (ref 16–232)
Iron: 37 ug/dL — ABNORMAL LOW (ref 45–160)
TIBC: 296 ug/dL (ref 250–450)

## 2023-07-27 MED ORDER — CYANOCOBALAMIN 500 MCG PO TABS: 1000.0000 ug | ORAL_TABLET | Freq: Every day | ORAL | 3 refills | Status: AC

## 2023-07-27 MED ORDER — FERROUS GLUCONATE 324 (38 FE) MG PO TABS: 324.0000 mg | ORAL_TABLET | Freq: Every day | ORAL | 3 refills | Status: DC

## 2023-07-27 NOTE — Telephone Encounter (Signed)
  Follow up Call-     07/26/2023    8:00 AM  Call back number  Post procedure Call Back phone  # [phone]  Permission to leave phone message Yes     Patient questions:  Do you have a fever, pain , or abdominal swelling? No. Pain Score  0 *  Have you tolerated food without any problems? Yes.    Have you been able to return to your normal activities? Yes.    Do you have any questions about your discharge instructions: Diet   No. Medications  No. Follow up visit  No.  Do you have questions or concerns about your Care? No.  Actions: * If pain score is 4 or above: No action needed, pain <4.

## 2023-07-27 NOTE — Telephone Encounter (Signed)
 See note below, adding pt verbalized concerns about labs drawn by GI 7/9. She was advised by Dr Curvin to hold PO Fe while on Doxycycline. Labs printed for Dr Odean to review.

## 2023-07-27 NOTE — Telephone Encounter (Signed)
-----   Message from Nurse Leita B sent at 07/27/2023 12:34 PM EDT ----- Regarding: treatment change Hi everyone,  Pt's intended start date for tx is 07/31/23; however, this has been cancelled as we have to defer tx per Dr Curvin. Pt is on doxycycline 100 mg BID x 10 days for post surgical infection. Pt states she will f/u with Dr Curvin 7/22.   Michelle & Dr Curvin,  Our question for you for you would be, is it OK for us  to proceed with treatment when pt finishes abx, or would you prefer to defer until her f/u with you?   Eileen, Please stay on alert for this, as Dr Gudena will need to change her plan accordingly, so all future appts will need to be r/s.   Thanks, Leita, LPN for Dr Odean

## 2023-07-27 NOTE — Therapy (Signed)
 OUTPATIENT PHYSICAL THERAPY BREAST CANCER POST OP FOLLOW UP   Patient Name: Mary Campbell MRN: [id number] DOB:1965/08/02, 58 y.o., female Today's Date: 07/28/2023  END OF SESSION:  PT End of Session - 07/28/23 1051     Visit Number 2    Number of Visits 2    Date for PT Re-Evaluation 08/02/23    PT Start Time 0800    PT Stop Time 0830    PT Time Calculation (min) 30 min    Activity Tolerance Patient tolerated treatment well    Behavior During Therapy WFL for tasks assessed/performed          Past Medical History:  Diagnosis Date   Anxiety    Arthritis    Breast cancer (HCC)    left   Chronic back pain    COPD (chronic obstructive pulmonary disease) (HCC)    Diabetes mellitus    Difficult intubation    Diverticulitis    Dyspnea    with exertion   Fibromyalgia    Hyperlipidemia    Hypertension    PAD (peripheral artery disease) (HCC)    a. PV angio 10/2018 - bilateral SFA disease s/p L intervention.   PAD (peripheral artery disease) (HCC)    PONV (postoperative nausea and vomiting)    Sleep apnea    Tobacco abuse    Past Surgical History:  Procedure Laterality Date   ABDOMINAL AORTOGRAM W/LOWER EXTREMITY Right 10/18/2018   Procedure: ABDOMINAL AORTOGRAM W/LOWER EXTREMITY;  Surgeon: Court Dorn PARAS, MD;  Location: [facility];  Service: Cardiovascular;  Laterality: Right;   ABDOMINAL AORTOGRAM W/LOWER EXTREMITY N/A 04/02/2020   Procedure: ABDOMINAL AORTOGRAM W/LOWER EXTREMITY;  Surgeon: Court Dorn PARAS, MD;  Location: [facility];  Service: Cardiovascular;  Laterality: N/A;   ABDOMINAL AORTOGRAM W/LOWER EXTREMITY N/A 04/23/2020   Procedure: ABDOMINAL AORTOGRAM W/LOWER EXTREMITY;  Surgeon: Court Dorn PARAS, MD;  Location: [facility];  Service: Cardiovascular;  Laterality: N/A;   ABDOMINAL HYSTERECTOMY     BREAST BIOPSY Left 05/31/2023   US  LT BREAST BX W LOC DEV 1ST LESION IMG BX SPEC US  GUIDE 05/31/2023 [facility]   BREAST BIOPSY   06/27/2023   MM LT RADIOACTIVE SEED LOC MAMMO GUIDE 06/27/2023 [facility]   BREAST LUMPECTOMY WITH RADIOACTIVE SEED AND SENTINEL LYMPH NODE BIOPSY Left 06/29/2023   Procedure: BREAST LUMPECTOMY WITH RADIOACTIVE SEED AND SENTINEL LYMPH NODE BIOPSY;  Surgeon: Curvin Deward MOULD, MD;  Location: [facility];  Service: General;  Laterality: Left;  LEFT BREAST RADIOACTIVE SEED LOCALIZED LUMPECTOMY   SENTINEL NODE BIOPSY   CHOLECYSTECTOMY     PERIPHERAL VASCULAR ATHERECTOMY Left 10/18/2018   Procedure: PERIPHERAL VASCULAR ATHERECTOMY;  Surgeon: Court Dorn PARAS, MD;  Location: [facility];  Service: Cardiovascular;  Laterality: Left;  SFA   PERIPHERAL VASCULAR ATHERECTOMY Right 11/22/2018   Procedure: PERIPHERAL VASCULAR ATHERECTOMY;  Surgeon: Court Dorn PARAS, MD;  Location: [facility] CV LAB;  Service: Cardiovascular;  Laterality: Right;  SFA   PERIPHERAL VASCULAR ATHERECTOMY  04/23/2020   Procedure: PERIPHERAL VASCULAR ATHERECTOMY;  Surgeon: Court Dorn PARAS, MD;  Location: [facility] INVASIVE CV LAB;  Service: Cardiovascular;;   PERIPHERAL VASCULAR BALLOON ANGIOPLASTY Right 11/22/2018   Procedure: PERIPHERAL VASCULAR BALLOON ANGIOPLASTY;  Surgeon: Court Dorn PARAS, MD;  Location: [facility];  Service: Cardiovascular;  Laterality: Right;  SFA   PERIPHERAL VASCULAR BALLOON ANGIOPLASTY  04/23/2020   Procedure: PERIPHERAL VASCULAR BALLOON ANGIOPLASTY;  Surgeon: Court Dorn PARAS, MD;  Location: [facility] INVASIVE  CV LAB;  Service: Cardiovascular;;   PERIPHERAL VASCULAR INTERVENTION Left 04/02/2020   Procedure: PERIPHERAL VASCULAR INTERVENTION;  Surgeon: Court Dorn PARAS, MD;  Location: [facility];  Service: Cardiovascular;  Laterality: Left;   PORTACATH PLACEMENT N/A 06/29/2023   Procedure: INSERTION, TUNNELED CENTRAL VENOUS DEVICE, WITH PORT;  Surgeon: Curvin Deward MOULD, MD;  Location: [facility];  Service: General;  Laterality: N/A;  PORT PLACEMENT WITH ULTRASOUND GUIDANCE GEN w/PEC BLOCK   Patient Active  Problem List   Diagnosis Date Noted   Severe obstructive sleep apnea 06/19/2023   Antiplatelet or antithrombotic long-term use 06/15/2023   Positive colorectal cancer screening using Cologuard test 06/15/2023   Malignant neoplasm of upper-outer quadrant of left breast in female, estrogen receptor negative (HCC) 06/05/2023   Excessive daytime sleepiness 12/22/2022   DOE (dyspnea on exertion) 12/22/2022   Centrilobular emphysema (HCC) 12/22/2022   Claudication in peripheral vascular disease (HCC) 10/04/2018   Essential hypertension 10/04/2018   Hyperlipidemia 10/04/2018   Tobacco abuse 10/04/2018   Family history of heart disease 10/04/2018    REFERRING PROVIDER: Dr. Curvin  REFERRING DIAG: Left breast cancer   THERAPY DIAG:  Malignant neoplasm of upper-outer quadrant of left breast in female, estrogen receptor negative (HCC)  Abnormal posture  Aftercare following surgery for neoplasm  At risk for lymphedema  Rationale for Evaluation and Treatment: Rehabilitation  ONSET DATE: 05/12/23  SUBJECTIVE:                                                                                                                                                                                           SUBJECTIVE STATEMENT: On doxycycline for post-op infection.  Had seroma in axilla drained x 3.  They had to delay chemo.    PERTINENT HISTORY:  Left lumpectomy and SLNB 06/29/23 on due to left grade 2 invasive ductal carcinoma breast cancer. 3 negative nodes removed.  It measured 1.2 cm and is located in the upper outer quadrant. It is ER/PR negative and HER2 positive with a Ki67 of 15%. She is on Plavix  for peripheral artery disease and smokes 1 pack/day. Fibromyalgia pain at baseline. Will have chemotherapy and radiation.    PATIENT GOALS:  Reassess how my recovery is going related to arm function, pain, and swelling.  PAIN:  Are you having pain?   PRECAUTIONS: Recent Surgery, left UE Lymphedema  risk, Fall  RED FLAGS: None   ACTIVITY LEVEL / LEISURE: Back to normal activity levels    OBJECTIVE:   PATIENT SURVEYS:  QUICK DASH: 30% From 54%  OBSERVATIONS: Never wore compression.  Well healed incisions, signs of small seroma under axillary  incision.    POSTURE:  Leans forward on cane when seated in the chair.    LYMPHEDEMA ASSESSMENT:   UPPER EXTREMITY AROM/PROM:   A/PROM RIGHT   eval    Shoulder extension 47  Shoulder flexion 145  Shoulder abduction 149  Shoulder internal rotation 61  Shoulder external rotation 85                          (Blank rows = not tested)   A/PROM LEFT   eval 07/28/23  Shoulder extension 43 50  Shoulder flexion 135 139  Shoulder abduction 139 140  Shoulder internal rotation 61 75  Shoulder external rotation 78 85                          (Blank rows = not tested)   CERVICAL AROM: All within normal limits   UPPER EXTREMITY STRENGTH: WFL   LYMPHEDEMA ASSESSMENTS (in cm):    LANDMARK RIGHT   eval  10 cm proximal to olecranon process 27.4  Olecranon process 25.8  10 cm proximal to ulnar styloid process 23.2  Just proximal to ulnar styloid process 17.2  Across hand at thumb web space 20.4  At base of 2nd digit 7.2  (Blank rows = not tested)   LANDMARK LEFT   eval 07/28/23  10 cm proximal to olecranon process 27.1 27.5  Olecranon process 25.4 25.5  10 cm proximal to ulnar styloid process 20.7 20  Just proximal to ulnar styloid process 16.9 17  Across hand at thumb web space 20.2 20.8  At base of 2nd digit 7.2 7.1  (Blank rows = not tested)  PATIENT EDUCATION:  Education details: per post op section Person educated: Patient Education method: Medical illustrator Education comprehension: verbalized understanding  HOME EXERCISE PROGRAM: Reviewed previously given post op HEP.   ASSESSMENT:  CLINICAL IMPRESSION: Pt is doing very well after surgery except for a recurrent seroma in the axilla.  She is not  limited in her motion or activities in any way.  She will continue SOZO screenings.   Pt will benefit from skilled therapeutic intervention to improve on the following deficits: Decreased knowledge of precautions, impaired UE functional use, pain, decreased ROM, postural dysfunction.   PT treatment/interventions: ADL/Self care home management, [phone]- Self Care   GOALS: Goals reviewed with patient? Yes  LONG TERM GOALS:  (STG=LTG)  GOALS Name Target Date  Goal status  1 Pt will demonstrate she has regained full shoulder ROM and function post operatively compared to baselines.  Baseline: 07/28/23 INITIAL                    PLAN:  PT FREQUENCY/DURATION: SOZO  PLAN FOR NEXT SESSION: SOZO   [facility]  [address], [address]  [place] [address]  [phone]  After Breast Cancer Class Video It is recommended you view the ABC class video to be educated on lymphedema risk reduction. This video lasts for about 30 minutes. It can be viewed on our website here: [email or web address]  Scar massage You can begin gentle scar massage to you incision sites. Gently place one hand on the incision and move the skin (without sliding on the skin) in various directions. Do this for a few minutes and then you can gently massage either coconut oil or vitamin E cream into the scars.  Compression garment You should continue wearing your compression bra until  you feel like you no longer have swelling.  Home exercise Program Continue doing the exercises you were given until you feel like you can do them without feeling any tightness at the end.   Walking Program Studies show that 30 minutes of walking per day (fast enough to elevate your heart rate) can significantly reduce the risk of a cancer recurrence. If you can't walk due to other medical reasons, we encourage you to find another activity you could do  (like a stationary bike or water exercise).  Posture After breast cancer surgery, people frequently sit with rounded shoulders posture because it puts their incisions on slack and feels better. If you sit like this and scar tissue forms in that position, you can become very tight and have pain sitting or standing with good posture. Try to be aware of your posture and sit and stand up tall to heal properly.  Follow up PT: It is recommended you return every 3 months for the first 3 years following surgery to be assessed on the SOZO machine for an L-Dex score. This helps prevent clinically significant lymphedema in 95% of patients. These follow up screens are 10 minute appointments that you are not billed for.  Larue Saddie SAUNDERS, PT 07/28/2023, 10:51 AM   PHYSICAL THERAPY DISCHARGE SUMMARY  Visits from Start of Care: 2  Current functional level related to goals / functional outcomes: Per above   Remaining deficits: Lymphedema risk    Education / Equipment: HEP  Plan: Patient agrees to discharge.  Patient is being discharged due to meeting the stated rehab goals.

## 2023-07-28 ENCOUNTER — Ambulatory Visit: Attending: General Surgery | Admitting: Rehabilitation

## 2023-07-28 ENCOUNTER — Encounter: Payer: Self-pay | Admitting: Rehabilitation

## 2023-07-28 DIAGNOSIS — Z483 Aftercare following surgery for neoplasm: Secondary | ICD-10-CM | POA: Insufficient documentation

## 2023-07-28 DIAGNOSIS — Z171 Estrogen receptor negative status [ER-]: Secondary | ICD-10-CM | POA: Diagnosis present

## 2023-07-28 DIAGNOSIS — R293 Abnormal posture: Secondary | ICD-10-CM | POA: Insufficient documentation

## 2023-07-28 DIAGNOSIS — C50412 Malignant neoplasm of upper-outer quadrant of left female breast: Secondary | ICD-10-CM | POA: Diagnosis present

## 2023-07-28 DIAGNOSIS — Z9189 Other specified personal risk factors, not elsewhere classified: Secondary | ICD-10-CM | POA: Insufficient documentation

## 2023-07-28 LAB — SURGICAL PATHOLOGY

## 2023-07-29 ENCOUNTER — Other Ambulatory Visit: Payer: Self-pay

## 2023-07-29 ENCOUNTER — Other Ambulatory Visit: Payer: Self-pay | Admitting: Hematology and Oncology

## 2023-07-31 ENCOUNTER — Ambulatory Visit: Admitting: Nurse Practitioner

## 2023-07-31 ENCOUNTER — Other Ambulatory Visit

## 2023-07-31 ENCOUNTER — Ambulatory Visit

## 2023-08-07 ENCOUNTER — Encounter: Payer: Self-pay | Admitting: *Deleted

## 2023-08-07 ENCOUNTER — Inpatient Hospital Stay (HOSPITAL_BASED_OUTPATIENT_CLINIC_OR_DEPARTMENT_OTHER): Admitting: Hematology and Oncology

## 2023-08-07 ENCOUNTER — Inpatient Hospital Stay

## 2023-08-07 ENCOUNTER — Encounter: Payer: Self-pay | Admitting: Hematology and Oncology

## 2023-08-07 VITALS — BP 147/51 | HR 79 | Temp 98.0°F | Resp 16 | Ht 64.0 in | Wt 174.7 lb

## 2023-08-07 VITALS — BP 176/67 | HR 72 | Temp 98.8°F | Resp 16

## 2023-08-07 DIAGNOSIS — Z171 Estrogen receptor negative status [ER-]: Secondary | ICD-10-CM | POA: Diagnosis not present

## 2023-08-07 DIAGNOSIS — C50412 Malignant neoplasm of upper-outer quadrant of left female breast: Secondary | ICD-10-CM | POA: Diagnosis not present

## 2023-08-07 DIAGNOSIS — Z95828 Presence of other vascular implants and grafts: Secondary | ICD-10-CM | POA: Insufficient documentation

## 2023-08-07 DIAGNOSIS — Z5111 Encounter for antineoplastic chemotherapy: Secondary | ICD-10-CM | POA: Diagnosis not present

## 2023-08-07 LAB — CMP (CANCER CENTER ONLY)
ALT: 7 U/L (ref 0–44)
AST: 12 U/L — ABNORMAL LOW (ref 15–41)
Albumin: 3.8 g/dL (ref 3.5–5.0)
Alkaline Phosphatase: 155 U/L — ABNORMAL HIGH (ref 38–126)
Anion gap: 6 (ref 5–15)
BUN: 15 mg/dL (ref 6–20)
CO2: 28 mmol/L (ref 22–32)
Calcium: 8.9 mg/dL (ref 8.9–10.3)
Chloride: 107 mmol/L (ref 98–111)
Creatinine: 1.06 mg/dL — ABNORMAL HIGH (ref 0.44–1.00)
GFR, Estimated: >60 mL/min (ref >60)
Glucose, Bld: 157 mg/dL — ABNORMAL HIGH (ref 70–99)
Potassium: 4 mmol/L (ref 3.5–5.1)
Sodium: 141 mmol/L (ref 135–145)
Total Bilirubin: 0.3 mg/dL (ref 0.0–1.2)
Total Protein: 6.8 g/dL (ref 6.5–8.1)

## 2023-08-07 LAB — CBC WITH DIFFERENTIAL (CANCER CENTER ONLY)
Abs Immature Granulocytes: 0.03 K/uL (ref 0.00–0.07)
Basophils Absolute: 0 K/uL (ref 0.0–0.1)
Basophils Relative: 0 %
Eosinophils Absolute: 0.1 K/uL (ref 0.0–0.5)
Eosinophils Relative: 1 %
HCT: 32.1 % — ABNORMAL LOW (ref 36.0–46.0)
Hemoglobin: 10.7 g/dL — ABNORMAL LOW (ref 12.0–15.0)
Immature Granulocytes: 0 %
Lymphocytes Relative: 19 %
Lymphs Abs: 1.9 K/uL (ref 0.7–4.0)
MCH: 30 pg (ref 26.0–34.0)
MCHC: 33.3 g/dL (ref 30.0–36.0)
MCV: 89.9 fL (ref 80.0–100.0)
Monocytes Absolute: 0.5 K/uL (ref 0.1–1.0)
Monocytes Relative: 5 %
Neutro Abs: 7.4 K/uL (ref 1.7–7.7)
Neutrophils Relative %: 75 %
Platelet Count: 276 K/uL (ref 150–400)
RBC: 3.57 MIL/uL — ABNORMAL LOW (ref 3.87–5.11)
RDW: 16.2 % — ABNORMAL HIGH (ref 11.5–15.5)
WBC Count: 9.9 K/uL (ref 4.0–10.5)
nRBC: 0 % (ref 0.0–0.2)

## 2023-08-07 MED ORDER — SODIUM CHLORIDE 0.9 % IV SOLN: INTRAVENOUS | Status: DC

## 2023-08-07 MED ORDER — SODIUM CHLORIDE 0.9% FLUSH
10.0000 mL | INTRAVENOUS | Status: DC | PRN
  Administered 2023-08-07: 10 mL

## 2023-08-07 MED ORDER — SODIUM CHLORIDE 0.9 % IV SOLN
80.0000 mg/m2 | Freq: Once | INTRAVENOUS | Status: AC
  Administered 2023-08-07: 150 mg via INTRAVENOUS
  Filled 2023-08-07: qty 25

## 2023-08-07 MED ORDER — DIPHENHYDRAMINE HCL 50 MG/ML IJ SOLN
25.0000 mg | Freq: Once | INTRAMUSCULAR | Status: AC
  Administered 2023-08-07: 25 mg via INTRAVENOUS
  Filled 2023-08-07: qty 1

## 2023-08-07 MED ORDER — TRASTUZUMAB-ANNS CHEMO 150 MG IV SOLR
4.0000 mg/kg | Freq: Once | INTRAVENOUS | Status: AC
  Administered 2023-08-07: 300 mg via INTRAVENOUS
  Filled 2023-08-07: qty 14.29

## 2023-08-07 MED ORDER — FAMOTIDINE IN NACL 20-0.9 MG/50ML-% IV SOLN
20.0000 mg | Freq: Once | INTRAVENOUS | Status: AC
  Administered 2023-08-07: 20 mg via INTRAVENOUS
  Filled 2023-08-07: qty 50

## 2023-08-07 MED ORDER — ACETAMINOPHEN 325 MG PO TABS
650.0000 mg | ORAL_TABLET | Freq: Once | ORAL | Status: AC
Start: 2023-08-07 — End: 2023-08-07
  Administered 2023-08-07: 650 mg via ORAL
  Filled 2023-08-07: qty 2

## 2023-08-07 MED ORDER — DEXAMETHASONE SODIUM PHOSPHATE 10 MG/ML IJ SOLN
10.0000 mg | Freq: Once | INTRAMUSCULAR | Status: AC
  Administered 2023-08-07: 10 mg via INTRAVENOUS
  Filled 2023-08-07: qty 1

## 2023-08-07 NOTE — Progress Notes (Signed)
 Patient Care Team: Pcp, No as PCP - General O'Neal, Darryle Ned, MD as PCP - Cardiology (Cardiology) Odean Potts, MD as Consulting Physician (Hematology and Oncology) Glean Stephane BROCKS, RN (Inactive) as Oncology Nurse Navigator Tyree Nanetta SAILOR, RN as Oncology Nurse Navigator Curvin Deward MOULD, MD as Consulting Physician (General Surgery) Izell Domino, MD as Attending Physician (Radiation Oncology)  DIAGNOSIS:  Encounter Diagnosis  Name Primary?   Malignant neoplasm of upper-outer quadrant of left breast in female, estrogen receptor negative (HCC) Yes    SUMMARY OF ONCOLOGIC HISTORY: Oncology History  Malignant neoplasm of upper-outer quadrant of left breast in female, estrogen receptor negative (HCC)  05/31/2023 Initial Diagnosis   Screening mammogram detected left breast mass and asymmetry retroareolar region, irregular spiculated mass 1.1 cm, axilla negative, ultrasound measured 1.2 cm, biopsy: Grade 2 IDC ER 0% PR 0% Ki67 15%, HER2 3+ positive   06/07/2023 Cancer Staging   Staging form: Breast, AJCC 8th Edition - Clinical: Stage IA (cT1c, cN0, cM0, G2, ER-, PR-, HER2+) - Signed by Odean Potts, MD on 06/07/2023 Stage prefix: Initial diagnosis Histologic grading system: 3 grade system   08/07/2023 -  Chemotherapy   Patient is on Treatment Plan : BREAST Paclitaxel  + Trastuzumab  q7d / Trastuzumab  q21d       CHIEF COMPLIANT: Cycle 1 Taxol  Herceptin   HISTORY OF PRESENT ILLNESS:  History of Present Illness Mary Campbell is a 58 year old female with HER2 positive breast cancer who presents for chemotherapy treatment and management of anemia.  She is currently undergoing chemotherapy for HER2 positive breast cancer. She is taking anti-nausea medication as a precaution. Recent blood work revealed deficiencies in iron and B12. She began iron supplementation on August 06, 2023, and experiences constipation, which she attributes to the iron pills, but she tolerates them. Her hemoglobin  level is 10.7, indicating anemia, and she is being monitored for any further drop in hemoglobin levels.     ALLERGIES:  is allergic to codeine, other, sulfonamide derivatives, and lisinopril.  MEDICATIONS:  Current Outpatient Medications  Medication Sig Dispense Refill   acetaminophen  (TYLENOL ) 650 MG CR tablet Take 1,300 mg by mouth in the morning, at noon, and at bedtime.     albuterol  (VENTOLIN  HFA) 108 (90 Base) MCG/ACT inhaler Inhale 2 puffs into the lungs every 6 (six) hours as needed for wheezing or shortness of breath. 8 g 2   amLODipine-benazepril (LOTREL) 5-20 MG capsule Take 1 capsule by mouth in the morning.     aspirin  EC 81 MG tablet Take 81 mg by mouth in the morning.     buPROPion (WELLBUTRIN XL) 150 MG 24 hr tablet Take 150 mg by mouth in the morning.     clobetasol cream (TEMOVATE) 0.05 % Apply 1 Application topically 2 (two) times daily as needed (skin irritation).     clopidogrel  (PLAVIX ) 75 MG tablet Take 1 tablet (75 mg total) by mouth daily. 90 tablet 1   cyanocobalamin  (VITAMIN B12) 500 MCG tablet Take 2 tablets (1,000 mcg total) by mouth daily. 60 tablet 3   DULoxetine (CYMBALTA) 30 MG capsule Take 30 mg by mouth in the morning. Total dose 90 mg (30 mg and 60 mg)     DULoxetine (CYMBALTA) 60 MG capsule Take 60 mg by mouth in the morning. Total dose 90 mg (30 mg and 60 mg)     ferrous gluconate  (FERGON) 324 MG tablet Take 1 tablet (324 mg total) by mouth daily with breakfast. 30 tablet 3   fluticasone  (  FLONASE ) 50 MCG/ACT nasal spray Place 1-2 sprays into both nostrils daily. 18.2 mL 2   gabapentin  (NEURONTIN ) 100 MG capsule Take 300 mg by mouth 2 (two) times daily.     JANUMET XR 50-1000 MG TB24 Take 1 tablet by mouth in the morning and at bedtime.     LANTUS  SOLOSTAR 100 UNIT/ML Solostar Pen Inject 50 Units into the skin at bedtime.     lidocaine -prilocaine  (EMLA ) cream Apply to affected area once 30 g 3   nicotine (NICODERM CQ - DOSED IN MG/24 HOURS) 21 mg/24hr  patch Place 1 patch onto the skin daily.     ondansetron  (ZOFRAN ) 8 MG tablet Take 1 tablet (8 mg total) by mouth every 8 (eight) hours as needed for nausea or vomiting. 30 tablet 1   prochlorperazine  (COMPAZINE ) 10 MG tablet Take 1 tablet (10 mg total) by mouth every 6 (six) hours as needed for nausea or vomiting. 30 tablet 1   rosuvastatin  (CRESTOR ) 40 MG tablet Take 1 tablet (40 mg total) by mouth daily. (Patient taking differently: Take 40 mg by mouth at bedtime.) 90 tablet 3   Tiotropium Bromide-Olodaterol (STIOLTO RESPIMAT ) 2.5-2.5 MCG/ACT AERS Inhale 2 puffs into the lungs daily. 4 g 11   Current Facility-Administered Medications  Medication Dose Route Frequency Provider Last Rate Last Admin   sodium chloride  flush (NS) 0.9 % injection 3 mL  3 mL Intravenous Q12H Court Dorn PARAS, MD       sodium chloride  flush (NS) 0.9 % injection 3 mL  3 mL Intravenous Q12H Court Dorn PARAS, MD        PHYSICAL EXAMINATION: ECOG PERFORMANCE STATUS: 1 - Symptomatic but completely ambulatory  Vitals:   08/07/23 1303  BP: (!) 147/51  Pulse: 79  Resp: 16  Temp: 98 F (36.7 C)  SpO2: 99%   Filed Weights   08/07/23 1303  Weight: 174 lb 11.2 oz (79.2 kg)   LABORATORY DATA:  I have reviewed the data as listed    Latest Ref Rng & Units 07/08/2023    8:04 PM 06/07/2023   12:24 PM 11/23/2020   11:25 AM  CMP  Glucose 70 - 99 mg/dL [phone]   BUN 6 - 20 mg/dL 11  9  12    Creatinine 0.44 - 1.00 mg/dL 8.86  9.03  8.99   Sodium 135 - 145 mmol/L 137  138  132   Potassium 3.5 - 5.1 mmol/L 3.2  4.1  4.0   Chloride 98 - 111 mmol/L 102  103  97   CO2 22 - 32 mmol/L 23  30  27    Calcium  8.9 - 10.3 mg/dL 8.7  9.1  9.2   Total Protein 6.5 - 8.1 g/dL 6.5  6.9  7.9   Total Bilirubin 0.0 - 1.2 mg/dL 0.3  0.3  0.4   Alkaline Phos 38 - 126 U/L 135  159  147   AST 15 - 41 U/L 17  10  17    ALT 0 - 44 U/L 11  8  12      Lab Results  Component Value Date   WBC 9.4 07/26/2023   HGB 11.8 (L) 07/26/2023    HCT 35.0 (L) 07/26/2023   MCV 88.6 07/26/2023   PLT 268.0 07/26/2023   NEUTROABS 9.1 (H) 07/08/2023    ASSESSMENT & PLAN:  Malignant neoplasm of upper-outer quadrant of left breast in female, estrogen receptor negative (HCC) 06/29/2023: Left lumpectomy: Grade 2 IDC 1.6 cm with intermediate  grade DCIS, margins negative, 0/3 lymph nodes negative, ER 0%, PR 0%, HER2 3+ positive, Ki-67 15%   Treatment plan: Adjuvant chemotherapy with Taxol  Herceptin  followed by Herceptin  maintenance for 1 year Adjuvant radiation therapy --------------------------------------------------------------------------------------------------- Current treatment: Cycle 1 Taxol  Herceptin  Labs reviewed, chemo education completed, chemo consent obtained, antiemetics were reviewed Echo 07/03/2023: EF 70 to 75%  Return to clinic in 1 week for cycle 2 and toxicity check Assessment & Plan Iron deficiency anemia Mild anemia with hemoglobin at 10.7. Iron supplementation delayed due to doxycycline interaction. Constipation noted as a side effect. Iron levels low but not critical. - Continue oral iron supplementation every couple of days if tolerated. - Consider IV iron if oral iron is not tolerated or hemoglobin drops further. - Monitor hemoglobin levels at each visit.  Vitamin B12 deficiency Vitamin B12 deficiency confirmed. Levels low but not critical. - Continue B12 supplementation.      No orders of the defined types were placed in this encounter.  The patient has a good understanding of the overall plan. she agrees with it. she will call with any problems that may develop before the next visit here. Total time spent: 30 mins including face to face time and time spent for planning, charting and co-ordination of care   Naomi MARLA Chad, MD 08/07/23

## 2023-08-07 NOTE — Assessment & Plan Note (Signed)
 06/29/2023: Left lumpectomy: 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, margins negative, 0/3 lymph nodes negative, ER 0%, PR 0%, HER2 3+ positive, Ki-67 15%   Treatment plan: Adjuvant chemotherapy with Taxol  Herceptin  followed by Herceptin  maintenance for 1 year Adjuvant radiation therapy --------------------------------------------------------------------------------------------------- Current treatment: Cycle 1 Taxol  Herceptin  Labs reviewed, chemo education completed, chemo consent obtained, antiemetics were reviewed Echo 07/03/2023: EF 70 to 75%  Return to clinic in 1 week for cycle 2 and toxicity check

## 2023-08-07 NOTE — Research (Signed)
 DCP-001: Use of a Clinical Trial Screening Tool to Address Cancer Health Disparities in the [facility] Research Program [facility])   Patient Mary Campbell was identified by Dr. Gudena as a potential candidate for the above listed study.  This Clinical Research Coordinator met with Mary Campbell, [id number] on 08/07/23 in a manner and location that ensures patient privacy to discuss participation in the above listed research study.  Patient is Accompanied by her daughter. A copy of the informed consent document and separate HIPAA Authorization was provided to the patient.  Patient reads, speaks, and understands [place]. Patient was previously provided with informed consent documents.  Patient has not yet read the informed consent documents and so documents were reviewed page by page today.  Patient was provided with the business card of this Coordinator and encouraged to contact the research team with any questions.  Patient was provided the option of taking informed consent documents home to review and was encouraged to review at their convenience with their support network, including other care providers. Patient is comfortable with making a decision regarding study participation today.  As outlined in the informed consent form, this Coordinator and Mary Campbell discussed the purpose of the research study, the investigational nature of the study, study procedures and requirements for study participation, potential risks and benefits of study participation, as well as alternatives to participation.   The patient understands participation is voluntary and they may withdraw from study participation at any time. Patient understands enrollment is pending full eligibility review.   Confidentiality and how the patient's information will be used as part of study participation were discussed.  Patient was informed there is not reimbursement provided for their time and effort spent on trial participation.     All questions were answered to patient's satisfaction.  The informed consent and separate HIPAA Authorization was reviewed page by page.  The patient's mental and emotional status is appropriate to provide informed consent, and the patient verbalizes an understanding of study participation.  Patient has agreed to participate in the above listed research study and has voluntarily signed the informed consent Protocol Version Date 04/25/23 and separate HIPAA Authorization, version 06/06/2023 on 08/07/23 at 1:04 PM.  The patient was provided with a copy of the signed informed consent form and separate HIPAA Authorization for their reference.  No study specific procedures were obtained prior to the signing of the informed consent document.  Approximately 15  minutes were spent with the patient reviewing the informed consent documents.  Patient was not requested to complete a Release of Information form.   This coordinator has reviewed this patient's inclusion and exclusion criteria and confirmed Mary Campbell is eligible for study participation.  Patient will continue with enrollment.  After consent, the following questions were asked of the patient.  1. Race (select all that apply):  White  2. Ethnicity (Select only one):  Not Hispanic/Latino   3. If the patient is of Hispanic or Latino ethnicity, please specify (specify only one): NA  4. Geographic Ethnic Group (Other than Hispanic or Latino; if patient reports Hispanic or Latino ethnicity, skip to next question):  Unknown/Don't know   5. Foreign Born?   No  6. Primary language spoken at home (Select only one):   English   7. How often does the patient have someone help when reading hospital materials (select only one):   Never  8. Marital Status (select only one):   Separated  9. Highest Level of Education (select only one):  Some college or Associate Degree  10. Employment status (select only one):   Disabled  11. Household  income   <$25,000   $25,000 to $50,000   $51,000 to $100,000   >$100,000   Choose not to disclose   12. Was patient insured at time of diagnosis?   Yes   13. Current method of payment (select only one):   Medicaid  14. Does the patient have a primary care provider?   Yes   15. Comorbidity (select all that apply)   Hypertension   Diabetes   Peripheral Vascular Disease   Hypercholesterolemia    16. What is your sex?   Female   17. Have you smoked at least 100 cigarettes (100 cigarettes is 5 packs) in your entire life? Yes   How many total years have you or did you smoke cigarettes? Do not count any time you have stayed off cigarettes, If less than 1 year, enter 1:  44 years (Stopped in May 2025)  On average when you have smoked, about how many cigarettes do you or did you smoke a day? (One pack is 20 cigarettes) 20 cigarettes (One Pack) per day   How long has it been since you last smoked a cigarette, even one or two puffs?     Less than one year (3 months)   How many years has is been since you last smoked a cigarette (even one or two puffs)? If less than one year, enter 0.     0: 3 months    18. Have you ever used an electronic nicotine product, even one or two times?  Yes    19. Do you NOW use an electronic nicotine produce every day, some days, or not at all?   Not at all    20. Pt is being screened on Adult Symptom/Cancer Control CCDR Trial Protocol number: S2205      21. Disease Code: Breast     Disease Stage: pT1C  22. Did the pt enrolled in the protocol: No   If the pt did not enroll in the protocol, indicate the reason and why (select only one):    Patient was eligible but declined participation. If the pt was eligible but the pt declined participation, indicate the patient-related reason why (select all that apply):   No desire to participate in research   Pt was thanked for her time and participation.   Jakin Pavao, Ph.D. Clinical Research  Coordinator [phone] 08/07/2023 1:30 PM

## 2023-08-07 NOTE — Patient Instructions (Signed)
 [facility] - A DEPT OF [facility]  Discharge Instructions: Thank you for choosing [facility] to provide your oncology and hematology care.   If you have a lab appointment with the Cancer Center, please go directly to the Cancer Center and check in at the registration area.   Wear comfortable clothing and clothing appropriate for easy access to any Portacath or PICC line.   We strive to give you quality time with your provider. You may need to reschedule your appointment if you arrive late (15 or more minutes).  Arriving late affects you and other patients whose appointments are after yours.  Also, if you miss three or more appointments without notifying the office, you may be dismissed from the clinic at the provider's discretion.      For prescription refill requests, have your pharmacy contact our office and allow 72 hours for refills to be completed.    Today you received the following chemotherapy and/or immunotherapy agents: PACLitaxel  (TAXOL )  and trastuzumab -anns (KANJINTI )     To help prevent nausea and vomiting after your treatment, we encourage you to take your nausea medication as directed.  BELOW ARE SYMPTOMS THAT SHOULD BE REPORTED IMMEDIATELY: *FEVER GREATER THAN 100.4 F (38 C) OR HIGHER *CHILLS OR SWEATING *NAUSEA AND VOMITING THAT IS NOT CONTROLLED WITH YOUR NAUSEA MEDICATION *UNUSUAL SHORTNESS OF BREATH *UNUSUAL BRUISING OR BLEEDING *URINARY PROBLEMS (pain or burning when urinating, or frequent urination) *BOWEL PROBLEMS (unusual diarrhea, constipation, pain near the anus) TENDERNESS IN MOUTH AND THROAT WITH OR WITHOUT PRESENCE OF ULCERS (sore throat, sores in mouth, or a toothache) UNUSUAL RASH, SWELLING OR PAIN  UNUSUAL VAGINAL DISCHARGE OR ITCHING   Items with * indicate a potential emergency and should be followed up as soon as possible or go to the Emergency Department if any problems should occur.  Please show the  CHEMOTHERAPY ALERT CARD or IMMUNOTHERAPY ALERT CARD at check-in to the Emergency Department and triage nurse.  Should you have questions after your visit or need to cancel or reschedule your appointment, please contact [facility] - A DEPT OF [facility]  Dept: [phone]  and follow the prompts.  Office hours are 8:00 a.m. to 4:30 p.m. Monday - Friday. Please note that voicemails left after 4:00 p.m. may not be returned until the following business day.  We are closed weekends and major holidays. You have access to a nurse at all times for urgent questions. Please call the main number to the clinic Dept: [phone] and follow the prompts.   For any non-urgent questions, you may also contact your provider using MyChart. We now offer e-Visits for anyone 29 and older to request care online for non-urgent symptoms. For details visit [email or web address].   Also download the MyChart app! Go to the app store, search MyChart, open the app, select [place], and log in with your MyChart username and password.

## 2023-08-10 ENCOUNTER — Encounter: Payer: Self-pay | Admitting: Cardiovascular Disease

## 2023-08-12 ENCOUNTER — Other Ambulatory Visit: Payer: Self-pay

## 2023-08-14 ENCOUNTER — Inpatient Hospital Stay

## 2023-08-14 ENCOUNTER — Other Ambulatory Visit

## 2023-08-14 ENCOUNTER — Ambulatory Visit

## 2023-08-14 VITALS — BP 155/62 | HR 85 | Temp 98.4°F | Resp 16 | Wt 174.2 lb

## 2023-08-14 DIAGNOSIS — C50412 Malignant neoplasm of upper-outer quadrant of left female breast: Secondary | ICD-10-CM

## 2023-08-14 DIAGNOSIS — Z5111 Encounter for antineoplastic chemotherapy: Secondary | ICD-10-CM | POA: Diagnosis not present

## 2023-08-14 LAB — CMP (CANCER CENTER ONLY)
ALT: 10 U/L (ref 0–44)
AST: 12 U/L — ABNORMAL LOW (ref 15–41)
Albumin: 3.6 g/dL (ref 3.5–5.0)
Alkaline Phosphatase: 149 U/L — ABNORMAL HIGH (ref 38–126)
Anion gap: 5 (ref 5–15)
BUN: 10 mg/dL (ref 6–20)
CO2: 28 mmol/L (ref 22–32)
Calcium: 8.6 mg/dL — ABNORMAL LOW (ref 8.9–10.3)
Chloride: 107 mmol/L (ref 98–111)
Creatinine: 0.92 mg/dL (ref 0.44–1.00)
GFR, Estimated: >60 mL/min (ref >60)
Glucose, Bld: 138 mg/dL — ABNORMAL HIGH (ref 70–99)
Potassium: 4.2 mmol/L (ref 3.5–5.1)
Sodium: 140 mmol/L (ref 135–145)
Total Bilirubin: 0.2 mg/dL (ref 0.0–1.2)
Total Protein: 6.7 g/dL (ref 6.5–8.1)

## 2023-08-14 LAB — CBC WITH DIFFERENTIAL (CANCER CENTER ONLY)
Abs Immature Granulocytes: 0.08 K/uL — ABNORMAL HIGH (ref 0.00–0.07)
Basophils Absolute: 0.1 K/uL (ref 0.0–0.1)
Basophils Relative: 1 %
Eosinophils Absolute: 0.3 K/uL (ref 0.0–0.5)
Eosinophils Relative: 3 %
HCT: 30.7 % — ABNORMAL LOW (ref 36.0–46.0)
Hemoglobin: 10.1 g/dL — ABNORMAL LOW (ref 12.0–15.0)
Immature Granulocytes: 1 %
Lymphocytes Relative: 18 %
Lymphs Abs: 1.6 K/uL (ref 0.7–4.0)
MCH: 29.9 pg (ref 26.0–34.0)
MCHC: 32.9 g/dL (ref 30.0–36.0)
MCV: 90.8 fL (ref 80.0–100.0)
Monocytes Absolute: 0.3 K/uL (ref 0.1–1.0)
Monocytes Relative: 4 %
Neutro Abs: 6.3 K/uL (ref 1.7–7.7)
Neutrophils Relative %: 73 %
Platelet Count: 275 K/uL (ref 150–400)
RBC: 3.38 MIL/uL — ABNORMAL LOW (ref 3.87–5.11)
RDW: 16.2 % — ABNORMAL HIGH (ref 11.5–15.5)
WBC Count: 8.5 K/uL (ref 4.0–10.5)
nRBC: 0 % (ref 0.0–0.2)

## 2023-08-14 MED ORDER — SODIUM CHLORIDE 0.9 % IV SOLN
INTRAVENOUS | Status: DC
Start: 2023-08-14 — End: 2023-08-14

## 2023-08-14 MED ORDER — SODIUM CHLORIDE 0.9% FLUSH
10.0000 mL | INTRAVENOUS | Status: DC | PRN
Start: 2023-08-14 — End: 2023-08-14
  Administered 2023-08-14: 10 mL

## 2023-08-14 MED ORDER — HEPARIN SOD (PORK) LOCK FLUSH 100 UNIT/ML IV SOLN
500.0000 [IU] | Freq: Once | INTRAVENOUS | Status: AC | PRN
  Administered 2023-08-14: 500 [IU]

## 2023-08-14 MED ORDER — ACETAMINOPHEN 325 MG PO TABS
650.0000 mg | ORAL_TABLET | Freq: Once | ORAL | Status: AC
  Administered 2023-08-14: 650 mg via ORAL
  Filled 2023-08-14: qty 2

## 2023-08-14 MED ORDER — FAMOTIDINE IN NACL 20-0.9 MG/50ML-% IV SOLN
20.0000 mg | Freq: Once | INTRAVENOUS | Status: AC
  Administered 2023-08-14: 20 mg via INTRAVENOUS
  Filled 2023-08-14: qty 50

## 2023-08-14 MED ORDER — SODIUM CHLORIDE 0.9 % IV SOLN
80.0000 mg/m2 | Freq: Once | INTRAVENOUS | Status: AC
  Administered 2023-08-14: 150 mg via INTRAVENOUS
  Filled 2023-08-14: qty 25

## 2023-08-14 MED ORDER — DIPHENHYDRAMINE HCL 50 MG/ML IJ SOLN
25.0000 mg | Freq: Once | INTRAMUSCULAR | Status: AC
  Administered 2023-08-14: 25 mg via INTRAVENOUS
  Filled 2023-08-14: qty 1

## 2023-08-14 MED ORDER — TRASTUZUMAB-ANNS CHEMO 150 MG IV SOLR
2.0000 mg/kg | Freq: Once | INTRAVENOUS | Status: AC
  Administered 2023-08-14: 150 mg via INTRAVENOUS
  Filled 2023-08-14: qty 7.14

## 2023-08-14 MED ORDER — DEXAMETHASONE SODIUM PHOSPHATE 10 MG/ML IJ SOLN
10.0000 mg | Freq: Once | INTRAMUSCULAR | Status: AC
  Administered 2023-08-14: 10 mg via INTRAVENOUS
  Filled 2023-08-14: qty 1

## 2023-08-14 NOTE — Patient Instructions (Signed)
 [facility] - A DEPT OF [facility]  Discharge Instructions: Thank you for choosing [facility] to provide your oncology and hematology care.   If you have a lab appointment with the Cancer Center, please go directly to the Cancer Center and check in at the registration area.   Wear comfortable clothing and clothing appropriate for easy access to any Portacath or PICC line.   We strive to give you quality time with your provider. You may need to reschedule your appointment if you arrive late (15 or more minutes).  Arriving late affects you and other patients whose appointments are after yours.  Also, if you miss three or more appointments without notifying the office, you may be dismissed from the clinic at the provider's discretion.      For prescription refill requests, have your pharmacy contact our office and allow 72 hours for refills to be completed.    Today you received the following chemotherapy and/or immunotherapy agents: PACLitaxel  (TAXOL )  and trastuzumab -anns (KANJINTI )     To help prevent nausea and vomiting after your treatment, we encourage you to take your nausea medication as directed.  BELOW ARE SYMPTOMS THAT SHOULD BE REPORTED IMMEDIATELY: *FEVER GREATER THAN 100.4 F (38 C) OR HIGHER *CHILLS OR SWEATING *NAUSEA AND VOMITING THAT IS NOT CONTROLLED WITH YOUR NAUSEA MEDICATION *UNUSUAL SHORTNESS OF BREATH *UNUSUAL BRUISING OR BLEEDING *URINARY PROBLEMS (pain or burning when urinating, or frequent urination) *BOWEL PROBLEMS (unusual diarrhea, constipation, pain near the anus) TENDERNESS IN MOUTH AND THROAT WITH OR WITHOUT PRESENCE OF ULCERS (sore throat, sores in mouth, or a toothache) UNUSUAL RASH, SWELLING OR PAIN  UNUSUAL VAGINAL DISCHARGE OR ITCHING   Items with * indicate a potential emergency and should be followed up as soon as possible or go to the Emergency Department if any problems should occur.  Please show the  CHEMOTHERAPY ALERT CARD or IMMUNOTHERAPY ALERT CARD at check-in to the Emergency Department and triage nurse.  Should you have questions after your visit or need to cancel or reschedule your appointment, please contact [facility] - A DEPT OF [facility]  Dept: [phone]  and follow the prompts.  Office hours are 8:00 a.m. to 4:30 p.m. Monday - Friday. Please note that voicemails left after 4:00 p.m. may not be returned until the following business day.  We are closed weekends and major holidays. You have access to a nurse at all times for urgent questions. Please call the main number to the clinic Dept: [phone] and follow the prompts.   For any non-urgent questions, you may also contact your provider using MyChart. We now offer e-Visits for anyone 29 and older to request care online for non-urgent symptoms. For details visit [email or web address].   Also download the MyChart app! Go to the app store, search MyChart, open the app, select [place], and log in with your MyChart username and password.

## 2023-08-15 ENCOUNTER — Telehealth: Payer: Self-pay | Admitting: *Deleted

## 2023-08-15 NOTE — Telephone Encounter (Signed)
 Received after hours call from pt stating her blood sugar increased to 642 yesterday evening after treatment.  Pt states she drank 3 bottles of water and walked on the treadmill for 15 minutes and her blood sugar decreased to 240.  Pt states she repeated this an hour later and blood sugar decreased to 124.  Pt requesting to discuss with MD at next visit the steroid dose and if it is needed with tx before speaking with PCP regarding insulin  dose.  MD will review with pt next week.  Pt educated to contact our office if blood sugar increases again, pt verbalized understanding.

## 2023-08-17 ENCOUNTER — Encounter: Payer: Self-pay | Admitting: Hematology and Oncology

## 2023-08-17 ENCOUNTER — Other Ambulatory Visit: Payer: Self-pay | Admitting: Hematology and Oncology

## 2023-08-17 ENCOUNTER — Inpatient Hospital Stay: Attending: Hematology and Oncology | Admitting: Licensed Clinical Social Worker

## 2023-08-17 DIAGNOSIS — C50412 Malignant neoplasm of upper-outer quadrant of left female breast: Secondary | ICD-10-CM

## 2023-08-17 NOTE — Progress Notes (Signed)
 Received call from patient referred by social worker to apply for one-time $1000 [employer] grant to assist with personal expenses while going through treatment. Advised what is needed to apply and how to submit. She will bring documents and complete process on 08/21/23 at next visit.  She has my card for any additional financial questions or concerns.

## 2023-08-17 NOTE — Progress Notes (Signed)
 [facility] CSW Progress Note  Copywriter, advertising contacted patient by phone to follow-up on financial concerns.    Interventions: Pt inquiring about financial assistance.  CSW verified pt is receiving food stamps and meets presumptive eligibility.  Pt provided w/ contact details for the patient financial resource specialist and instructed to apply for the [employer].  Pt also requesting to sign up for an Advanced Directives Clinic.  Pt scheduled at 11:30 on 8/22.  Primary CSW to follow up w/ pt for additional grant opportunities.        Follow Up Plan:  CSW will follow-up with patient by phone     Devere JONELLE Manna, LCSW Clinical Social Worker [facility]    Patient is participating in a Managed Medicaid Plan:  Yes

## 2023-08-21 ENCOUNTER — Inpatient Hospital Stay

## 2023-08-21 ENCOUNTER — Encounter: Payer: Self-pay | Admitting: *Deleted

## 2023-08-21 ENCOUNTER — Inpatient Hospital Stay: Attending: Hematology and Oncology

## 2023-08-21 ENCOUNTER — Inpatient Hospital Stay (HOSPITAL_BASED_OUTPATIENT_CLINIC_OR_DEPARTMENT_OTHER): Admitting: Hematology and Oncology

## 2023-08-21 VITALS — BP 122/50 | HR 68 | Temp 98.2°F | Resp 17 | Ht 64.0 in | Wt 171.6 lb

## 2023-08-21 DIAGNOSIS — Z171 Estrogen receptor negative status [ER-]: Secondary | ICD-10-CM

## 2023-08-21 DIAGNOSIS — Z79899 Other long term (current) drug therapy: Secondary | ICD-10-CM | POA: Insufficient documentation

## 2023-08-21 DIAGNOSIS — D509 Iron deficiency anemia, unspecified: Secondary | ICD-10-CM | POA: Diagnosis not present

## 2023-08-21 DIAGNOSIS — Z5111 Encounter for antineoplastic chemotherapy: Secondary | ICD-10-CM | POA: Insufficient documentation

## 2023-08-21 DIAGNOSIS — T451X5A Adverse effect of antineoplastic and immunosuppressive drugs, initial encounter: Secondary | ICD-10-CM | POA: Diagnosis not present

## 2023-08-21 DIAGNOSIS — C50412 Malignant neoplasm of upper-outer quadrant of left female breast: Secondary | ICD-10-CM

## 2023-08-21 DIAGNOSIS — E1165 Type 2 diabetes mellitus with hyperglycemia: Secondary | ICD-10-CM | POA: Insufficient documentation

## 2023-08-21 DIAGNOSIS — Z95828 Presence of other vascular implants and grafts: Secondary | ICD-10-CM

## 2023-08-21 LAB — CBC WITH DIFFERENTIAL (CANCER CENTER ONLY)
Abs Immature Granulocytes: 0.03 K/uL (ref 0.00–0.07)
Basophils Absolute: 0 K/uL (ref 0.0–0.1)
Basophils Relative: 1 %
Eosinophils Absolute: 0.2 K/uL (ref 0.0–0.5)
Eosinophils Relative: 3 %
HCT: 31.4 % — ABNORMAL LOW (ref 36.0–46.0)
Hemoglobin: 10.3 g/dL — ABNORMAL LOW (ref 12.0–15.0)
Immature Granulocytes: 1 %
Lymphocytes Relative: 30 %
Lymphs Abs: 1.8 K/uL (ref 0.7–4.0)
MCH: 29.3 pg (ref 26.0–34.0)
MCHC: 32.8 g/dL (ref 30.0–36.0)
MCV: 89.2 fL (ref 80.0–100.0)
Monocytes Absolute: 0.4 K/uL (ref 0.1–1.0)
Monocytes Relative: 7 %
Neutro Abs: 3.5 K/uL (ref 1.7–7.7)
Neutrophils Relative %: 58 %
Platelet Count: 328 K/uL (ref 150–400)
RBC: 3.52 MIL/uL — ABNORMAL LOW (ref 3.87–5.11)
RDW: 16.5 % — ABNORMAL HIGH (ref 11.5–15.5)
WBC Count: 6.1 K/uL (ref 4.0–10.5)
nRBC: 0 % (ref 0.0–0.2)

## 2023-08-21 LAB — CMP (CANCER CENTER ONLY)
ALT: 9 U/L (ref 0–44)
AST: 11 U/L — ABNORMAL LOW (ref 15–41)
Albumin: 3.5 g/dL (ref 3.5–5.0)
Alkaline Phosphatase: 113 U/L (ref 38–126)
Anion gap: 6 (ref 5–15)
BUN: 10 mg/dL (ref 6–20)
CO2: 25 mmol/L (ref 22–32)
Calcium: 8.3 mg/dL — ABNORMAL LOW (ref 8.9–10.3)
Chloride: 106 mmol/L (ref 98–111)
Creatinine: 0.91 mg/dL (ref 0.44–1.00)
GFR, Estimated: >60 mL/min (ref >60)
Glucose, Bld: 107 mg/dL — ABNORMAL HIGH (ref 70–99)
Potassium: 3.6 mmol/L (ref 3.5–5.1)
Sodium: 137 mmol/L (ref 135–145)
Total Bilirubin: 0.2 mg/dL (ref 0.0–1.2)
Total Protein: 6.2 g/dL — ABNORMAL LOW (ref 6.5–8.1)

## 2023-08-21 MED ORDER — SODIUM CHLORIDE 0.9 % IV SOLN
65.0000 mg/m2 | Freq: Once | INTRAVENOUS | Status: AC
  Administered 2023-08-21: 126 mg via INTRAVENOUS
  Filled 2023-08-21: qty 21

## 2023-08-21 MED ORDER — FAMOTIDINE IN NACL 20-0.9 MG/50ML-% IV SOLN
20.0000 mg | Freq: Once | INTRAVENOUS | Status: AC
  Administered 2023-08-21: 20 mg via INTRAVENOUS
  Filled 2023-08-21: qty 50

## 2023-08-21 MED ORDER — ACETAMINOPHEN 325 MG PO TABS
650.0000 mg | ORAL_TABLET | Freq: Once | ORAL | Status: AC
  Administered 2023-08-21: 650 mg via ORAL
  Filled 2023-08-21: qty 2

## 2023-08-21 MED ORDER — DIPHENHYDRAMINE HCL 50 MG/ML IJ SOLN
25.0000 mg | Freq: Once | INTRAMUSCULAR | Status: AC
  Administered 2023-08-21: 25 mg via INTRAVENOUS
  Filled 2023-08-21: qty 1

## 2023-08-21 MED ORDER — ONDANSETRON HCL 4 MG/2ML IJ SOLN
8.0000 mg | Freq: Once | INTRAMUSCULAR | Status: AC
  Administered 2023-08-21: 8 mg via INTRAVENOUS
  Filled 2023-08-21: qty 4

## 2023-08-21 MED ORDER — SODIUM CHLORIDE 0.9% FLUSH
10.0000 mL | INTRAVENOUS | Status: DC | PRN
Start: 2023-08-21 — End: 2023-08-21

## 2023-08-21 MED ORDER — HEPARIN SOD (PORK) LOCK FLUSH 100 UNIT/ML IV SOLN
500.0000 [IU] | Freq: Once | INTRAVENOUS | Status: DC | PRN
Start: 2023-08-21 — End: 2023-08-21

## 2023-08-21 MED ORDER — TRASTUZUMAB-ANNS CHEMO 150 MG IV SOLR
2.0000 mg/kg | Freq: Once | INTRAVENOUS | Status: AC
  Administered 2023-08-21: 150 mg via INTRAVENOUS
  Filled 2023-08-21: qty 7.14

## 2023-08-21 MED ORDER — SODIUM CHLORIDE 0.9% FLUSH
10.0000 mL | INTRAVENOUS | Status: DC | PRN
  Administered 2023-08-21: 10 mL

## 2023-08-21 MED ORDER — SODIUM CHLORIDE 0.9 % IV SOLN
INTRAVENOUS | Status: DC
Start: 2023-08-21 — End: 2023-08-21

## 2023-08-21 NOTE — Assessment & Plan Note (Signed)
 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

## 2023-08-21 NOTE — Progress Notes (Signed)
 Patient Care Team: Pcp, No as PCP - General O'Neal, Darryle Ned, MD as PCP - Cardiology (Cardiology) Odean Potts, MD as Consulting Physician (Hematology and Oncology) Glean Stephane BROCKS, RN (Inactive) as Oncology Nurse Navigator Tyree Nanetta SAILOR, RN as Oncology Nurse Navigator Curvin Deward MOULD, MD as Consulting Physician (General Surgery) Izell Domino, MD as Attending Physician (Radiation Oncology)  DIAGNOSIS:  Encounter Diagnosis  Name Primary?   Malignant neoplasm of upper-outer quadrant of left breast in female, estrogen receptor negative (HCC) Yes    SUMMARY OF ONCOLOGIC HISTORY: Oncology History  Malignant neoplasm of upper-outer quadrant of left breast in female, estrogen receptor negative (HCC)  05/31/2023 Initial Diagnosis   Screening mammogram detected left breast mass and asymmetry retroareolar region, irregular spiculated mass 1.1 cm, axilla negative, ultrasound measured 1.2 cm, biopsy: Grade 2 IDC ER 0% PR 0% Ki67 15%, HER2 3+ positive   06/07/2023 Cancer Staging   Staging form: Breast, AJCC 8th Edition - Clinical: Stage IA (cT1c, cN0, cM0, G2, ER-, PR-, HER2+) - Signed by Odean Potts, MD on 06/07/2023 Stage prefix: Initial diagnosis Histologic grading system: 3 grade system   08/07/2023 -  Chemotherapy   Patient is on Treatment Plan : BREAST Paclitaxel  + Trastuzumab  q7d / Trastuzumab  q21d       CHIEF COMPLIANT: Cycle 3 Taxol  Herceptin   HISTORY OF PRESENT ILLNESS:   History of Present Illness Mary Campbell is a 58 year old female with diabetes who presents with elevated blood sugar levels following treatment.  Blood sugar levels increased significantly to 642 mg/dL after the second treatment but were reduced to 240 mg/dL with water and treadmill exercise, and further to 149 mg/dL with additional water. Current blood sugar is 107 mg/dL.  She experiences daily diarrhea with loose stools three times a day, controlled with Imodium. Nausea varies in severity, with  last week being particularly severe.  Hemoglobin level was 10.1 g/dL last week, improving slightly to 10.3 g/dL. She is on iron supplementation.  No numbness or tingling in fingers, but numbness in feet occurs if sitting too long, managed with range of motion exercises. Occasional ankle swelling is noted.     ALLERGIES:  is allergic to codeine, other, sulfonamide derivatives, and lisinopril.  MEDICATIONS:  Current Outpatient Medications  Medication Sig Dispense Refill   acetaminophen  (TYLENOL ) 650 MG CR tablet Take 1,300 mg by mouth in the morning, at noon, and at bedtime.     albuterol  (VENTOLIN  HFA) 108 (90 Base) MCG/ACT inhaler Inhale 2 puffs into the lungs every 6 (six) hours as needed for wheezing or shortness of breath. 8 g 2   amLODipine-benazepril (LOTREL) 5-20 MG capsule Take 1 capsule by mouth in the morning.     aspirin  EC 81 MG tablet Take 81 mg by mouth in the morning.     buPROPion (WELLBUTRIN XL) 150 MG 24 hr tablet Take 150 mg by mouth in the morning.     clobetasol cream (TEMOVATE) 0.05 % Apply 1 Application topically 2 (two) times daily as needed (skin irritation).     clopidogrel  (PLAVIX ) 75 MG tablet Take 1 tablet (75 mg total) by mouth daily. 90 tablet 1   cyanocobalamin  (VITAMIN B12) 500 MCG tablet Take 2 tablets (1,000 mcg total) by mouth daily. 60 tablet 3   DULoxetine (CYMBALTA) 30 MG capsule Take 30 mg by mouth in the morning. Total dose 90 mg (30 mg and 60 mg)     DULoxetine (CYMBALTA) 60 MG capsule Take 60 mg by mouth in the  morning. Total dose 90 mg (30 mg and 60 mg)     ferrous gluconate  (FERGON) 324 MG tablet Take 1 tablet (324 mg total) by mouth daily with breakfast. 30 tablet 3   fluticasone  (FLONASE ) 50 MCG/ACT nasal spray Place 1-2 sprays into both nostrils daily. 18.2 mL 2   gabapentin  (NEURONTIN ) 100 MG capsule Take 300 mg by mouth 2 (two) times daily.     JANUMET XR 50-1000 MG TB24 Take 1 tablet by mouth in the morning and at bedtime.     LANTUS   SOLOSTAR 100 UNIT/ML Solostar Pen Inject 50 Units into the skin at bedtime.     lidocaine -prilocaine  (EMLA ) cream Apply to affected area once 30 g 3   nicotine (NICODERM CQ - DOSED IN MG/24 HOURS) 21 mg/24hr patch Place 1 patch onto the skin daily.     ondansetron  (ZOFRAN ) 8 MG tablet Take 1 tablet (8 mg total) by mouth every 8 (eight) hours as needed for nausea or vomiting. 30 tablet 1   prochlorperazine  (COMPAZINE ) 10 MG tablet Take 1 tablet (10 mg total) by mouth every 6 (six) hours as needed for nausea or vomiting. 30 tablet 1   rosuvastatin  (CRESTOR ) 40 MG tablet Take 1 tablet (40 mg total) by mouth daily. 90 tablet 3   Tiotropium Bromide-Olodaterol (STIOLTO RESPIMAT ) 2.5-2.5 MCG/ACT AERS Inhale 2 puffs into the lungs daily. 4 g 11   Current Facility-Administered Medications  Medication Dose Route Frequency Provider Last Rate Last Admin   sodium chloride  flush (NS) 0.9 % injection 3 mL  3 mL Intravenous Q12H Court Dorn PARAS, MD       sodium chloride  flush (NS) 0.9 % injection 3 mL  3 mL Intravenous Q12H Court Dorn PARAS, MD        PHYSICAL EXAMINATION: ECOG PERFORMANCE STATUS: 1 - Symptomatic but completely ambulatory  Vitals:   08/21/23 0806 08/21/23 0811  BP: (!) 161/50 (!) 122/50  Pulse: 70 68  Resp: 17   Temp: 98.2 F (36.8 C)   SpO2: 100%    Filed Weights   08/21/23 0806  Weight: 171 lb 9.6 oz (77.8 kg)     LABORATORY DATA:  I have reviewed the data as listed    Latest Ref Rng & Units 08/14/2023    9:37 AM 08/07/2023   12:42 PM 07/08/2023    8:04 PM  CMP  Glucose 70 - 99 mg/dL 861  842  880   BUN 6 - 20 mg/dL 10  15  11    Creatinine 0.44 - 1.00 mg/dL 9.07  8.93  8.86   Sodium 135 - 145 mmol/L 140  141  137   Potassium 3.5 - 5.1 mmol/L 4.2  4.0  3.2   Chloride 98 - 111 mmol/L 107  107  102   CO2 22 - 32 mmol/L 28  28  23    Calcium  8.9 - 10.3 mg/dL 8.6  8.9  8.7   Total Protein 6.5 - 8.1 g/dL 6.7  6.8  6.5   Total Bilirubin 0.0 - 1.2 mg/dL 0.2  0.3  0.3    Alkaline Phos 38 - 126 U/L 149  155  135   AST 15 - 41 U/L 12  12  17    ALT 0 - 44 U/L 10  7  11      Lab Results  Component Value Date   WBC 6.1 08/21/2023   HGB 10.3 (L) 08/21/2023   HCT 31.4 (L) 08/21/2023   MCV 89.2 08/21/2023   PLT 328  08/21/2023   NEUTROABS 3.5 08/21/2023    ASSESSMENT & PLAN:  Malignant neoplasm of upper-outer quadrant of left breast in female, estrogen receptor negative (HCC) 06/29/2023: Left lumpectomy: 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  Therefore I reduced the dosage of Taxol  today to 65 mg/m. Anemia: Monitoring closely.  Patient reports that she is taking oral iron. Diarrhea 2-3 loose stools per day.  She takes Imodium as needed Nausea after cycle 2: I added Zofran  to her treatment Severe hyperglycemia from diabetes after treatment secondary to Decadron  (sugar went up to 600s): Decadron  has been discontinued.  Return to clinic weekly for chemo and every other week for follow-up with us  ------------------------------------- Assessment and Plan Assessment & Plan Malignant neoplasm of upper-outer quadrant of left breast undergoing chemotherapy Undergoing chemotherapy with side effects including hyperglycemia, nausea, diarrhea, and mild peripheral neuropathy. Adjustments to chemotherapy regimen are necessary to manage side effects. - Remove steroids from treatment regimen. - Add nausea medication to IV for next chemotherapy round. - Lower Taxol   dose to prevent worsening of peripheral neuropathy.  Chemotherapy-induced hyperglycemia Significant hyperglycemia with blood sugar levels reaching 642 mg/dL post-chemotherapy, managed to 107 mg/dL through exercise and hydration.  Chemotherapy-induced nausea and diarrhea Experiencing nausea and diarrhea as side effects of chemotherapy. Diarrhea managed with Imodium, nausea was severe last week. - Add nausea medication to IV for next chemotherapy round. - Continue using Imodium for diarrhea as needed.  Chemotherapy-induced mild peripheral neuropathy Mild peripheral neuropathy symptoms, including numbness in feet, exacerbated by sitting. Symptoms predate chemotherapy but are a concern due to diabetes and existing mild nerve issues. - Lower Taxol  dose to prevent worsening of peripheral neuropathy. - Monitor for any worsening of neuropathy symptoms.  Iron deficiency anemia Slight improvement in hemoglobin levels from 10.1 to 10.3 g/dL. Currently on iron supplementation. - Continue iron supplementation.      No orders of the defined types were placed in this encounter.  The patient has a good understanding of the overall plan. she agrees with it. she will call with any problems that may develop before the next visit here. Total time spent: 30 mins including face to face time and time spent for planning, charting and co-ordination of care   Naomi MARLA Chad, MD 08/21/23

## 2023-08-21 NOTE — Patient Instructions (Signed)
 [facility]  Discharge Instructions: Thank you for choosing [facility] to provide your oncology and hematology care.   If you have a lab appointment with the Cancer Center, please go directly to the Cancer Center and check in at the registration area.   Wear comfortable clothing and clothing appropriate for easy access to any Portacath or PICC line.   We strive to give you quality time with your provider. You may need to reschedule your appointment if you arrive late (15 or more minutes).  Arriving late affects you and other patients whose appointments are after yours.  Also, if you miss three or more appointments without notifying the office, you may be dismissed from the clinic at the provider's discretion.      For prescription refill requests, have your pharmacy contact our office and allow 72 hours for refills to be completed.    Today you received the following chemotherapy and/or immunotherapy agents: Kanjinti, Paclitaxel.       To help prevent nausea and vomiting after your treatment, we encourage you to take your nausea medication as directed.  BELOW ARE SYMPTOMS THAT SHOULD BE REPORTED IMMEDIATELY: *FEVER GREATER THAN 100.4 F (38 C) OR HIGHER *CHILLS OR SWEATING *NAUSEA AND VOMITING THAT IS NOT CONTROLLED WITH YOUR NAUSEA MEDICATION *UNUSUAL SHORTNESS OF BREATH *UNUSUAL BRUISING OR BLEEDING *URINARY PROBLEMS (pain or burning when urinating, or frequent urination) *BOWEL PROBLEMS (unusual diarrhea, constipation, pain near the anus) TENDERNESS IN MOUTH AND THROAT WITH OR WITHOUT PRESENCE OF ULCERS (sore throat, sores in mouth, or a toothache) UNUSUAL RASH, SWELLING OR PAIN  UNUSUAL VAGINAL DISCHARGE OR ITCHING   Items with * indicate a potential emergency and should be followed up as soon as possible or go to the Emergency Department if any problems should occur.  Please show the CHEMOTHERAPY ALERT CARD or  IMMUNOTHERAPY ALERT CARD at check-in to the Emergency Department and triage nurse.  Should you have questions after your visit or need to cancel or reschedule your appointment, please contact [facility] A DEPT [facility]  Dept: [phone]  and follow the prompts.  Office hours are 8:00 a.m. to 4:30 p.m. Monday - Friday. Please note that voicemails left after 4:00 p.m. may not be returned until the following business day.  We are closed weekends and major holidays. You have access to a nurse at all times for urgent questions. Please call the main number to the clinic Dept: [phone] and follow the prompts.   For any non-urgent questions, you may also contact your provider using MyChart. We now offer e-Visits for anyone 53 and older to request care online for non-urgent symptoms. For details visit [email or web address].   Also download the MyChart app! Go to the app store, search "MyChart", open the app, select [place], and log in with your MyChart username and password.

## 2023-08-23 ENCOUNTER — Encounter: Payer: Self-pay | Admitting: Hematology and Oncology

## 2023-08-23 NOTE — Progress Notes (Signed)
 Received documents via email for patient to continue with [employer] grant application process. Patient will complete paperwork at next appointment 08/28/23 and receive packet to review and contact me at earliest convenience to discuss.  She has my card to do so and for any additional financial questions or concerns.

## 2023-08-26 IMAGING — DX DG HIP (WITH OR WITHOUT PELVIS) 2-3V*R*
3 series · 3 of 3 positions shown · non-contrast
Comparison: None Available.

CLINICAL DATA: Acute right hip pain.

EXAM:
DG HIP (WITH OR WITHOUT PELVIS) 2-3V RIGHT

[pelvis standing ap]
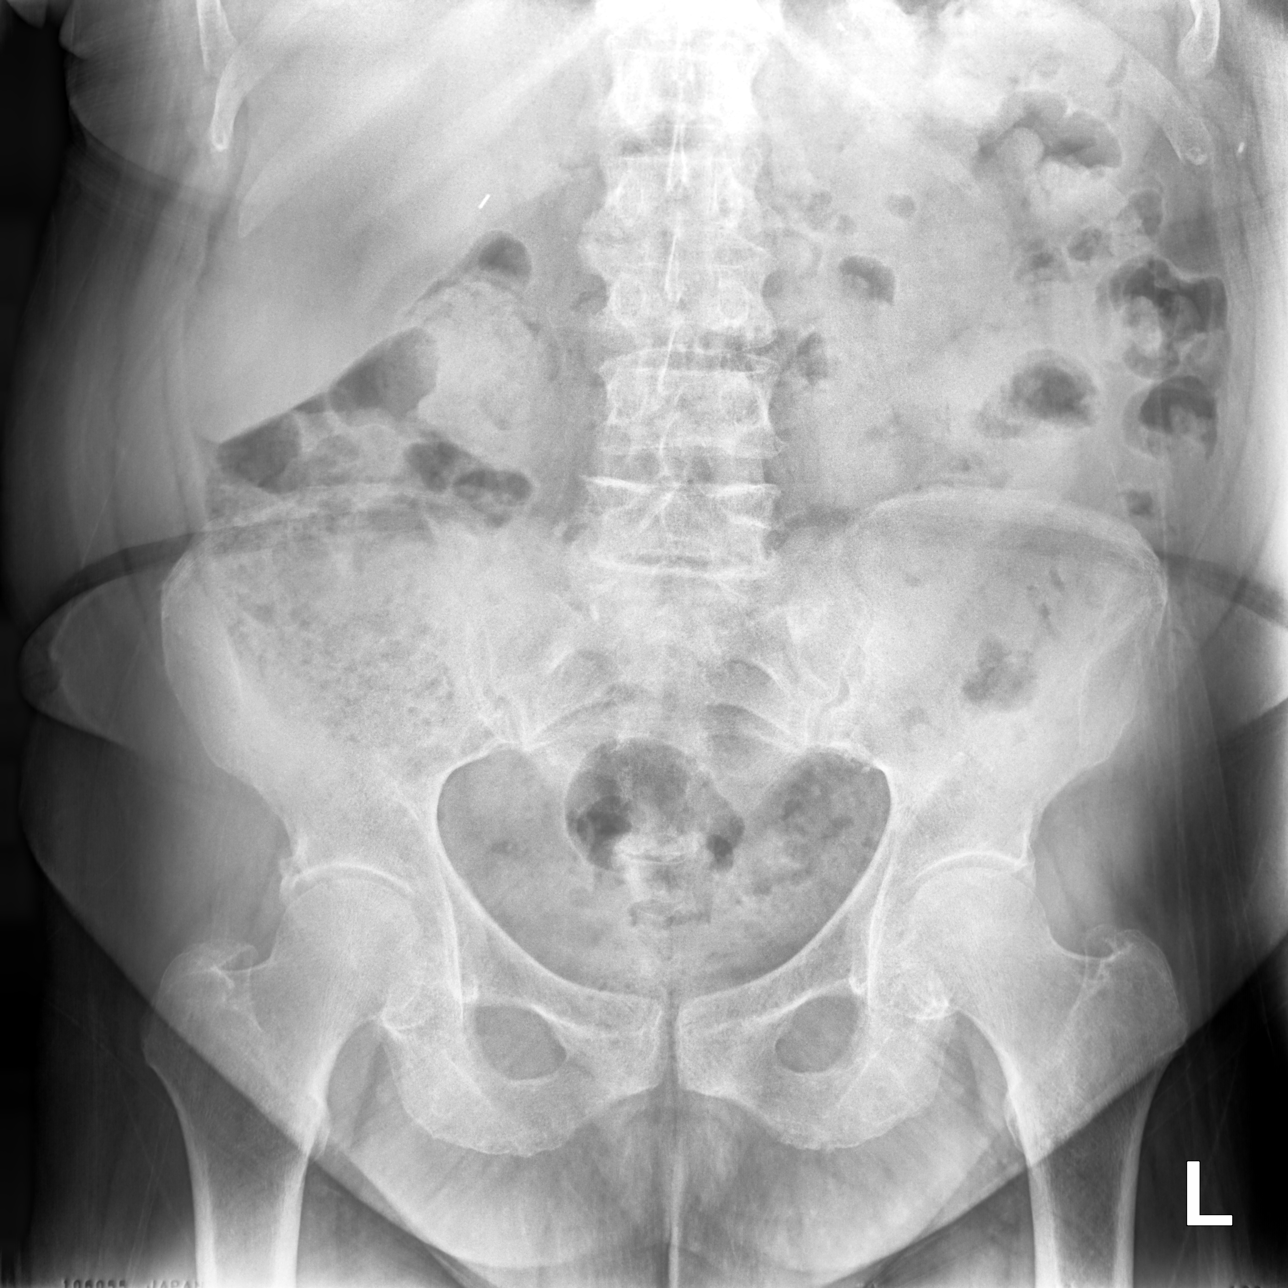

[hip joint ap]
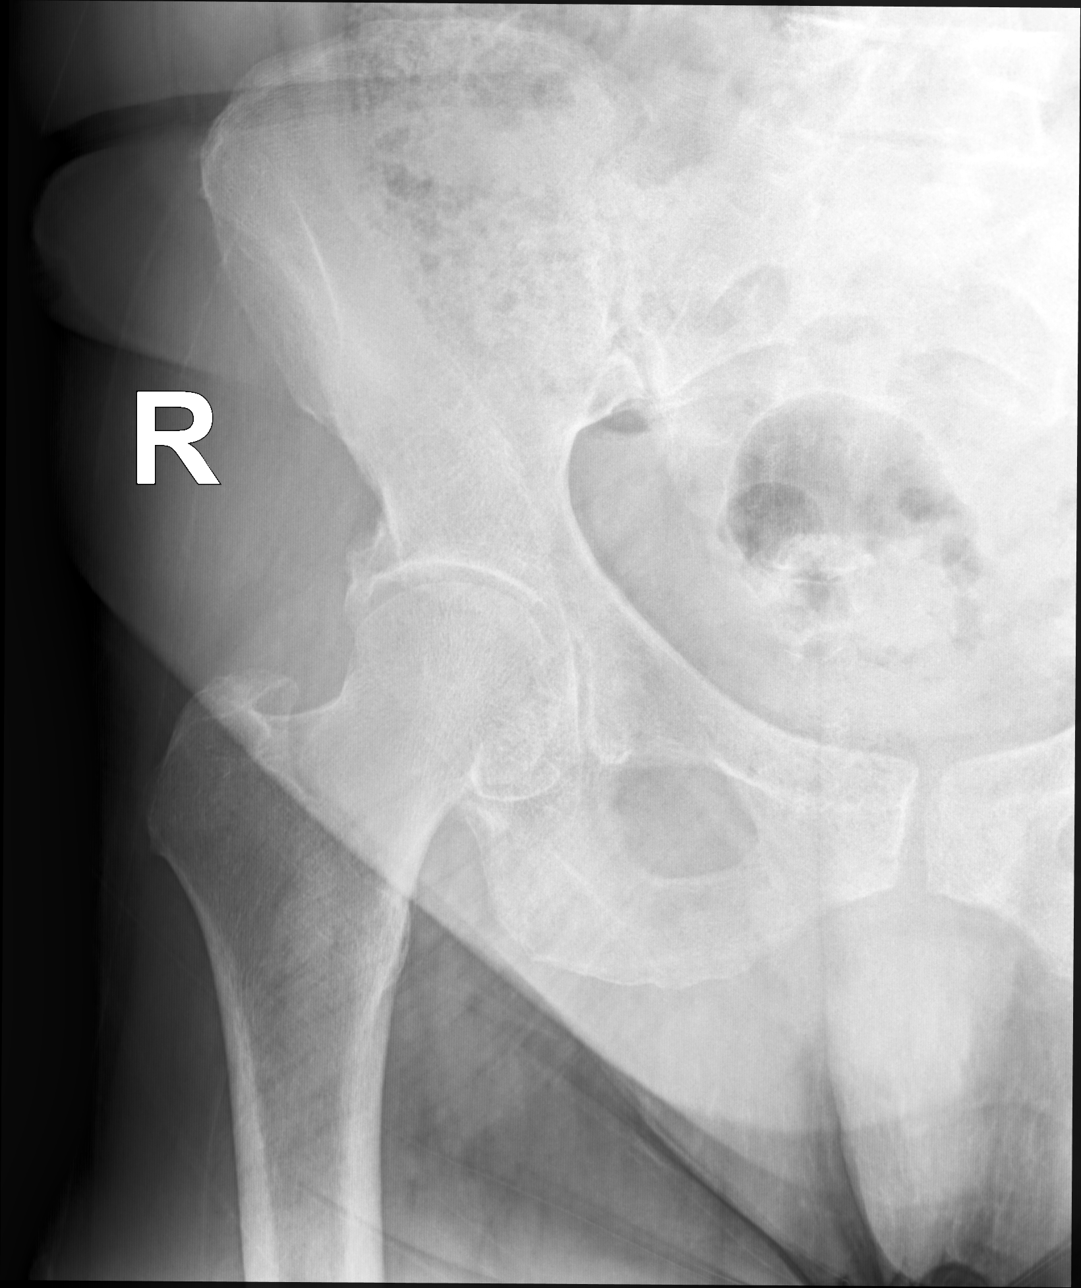

[hip joint [person_name] projection]
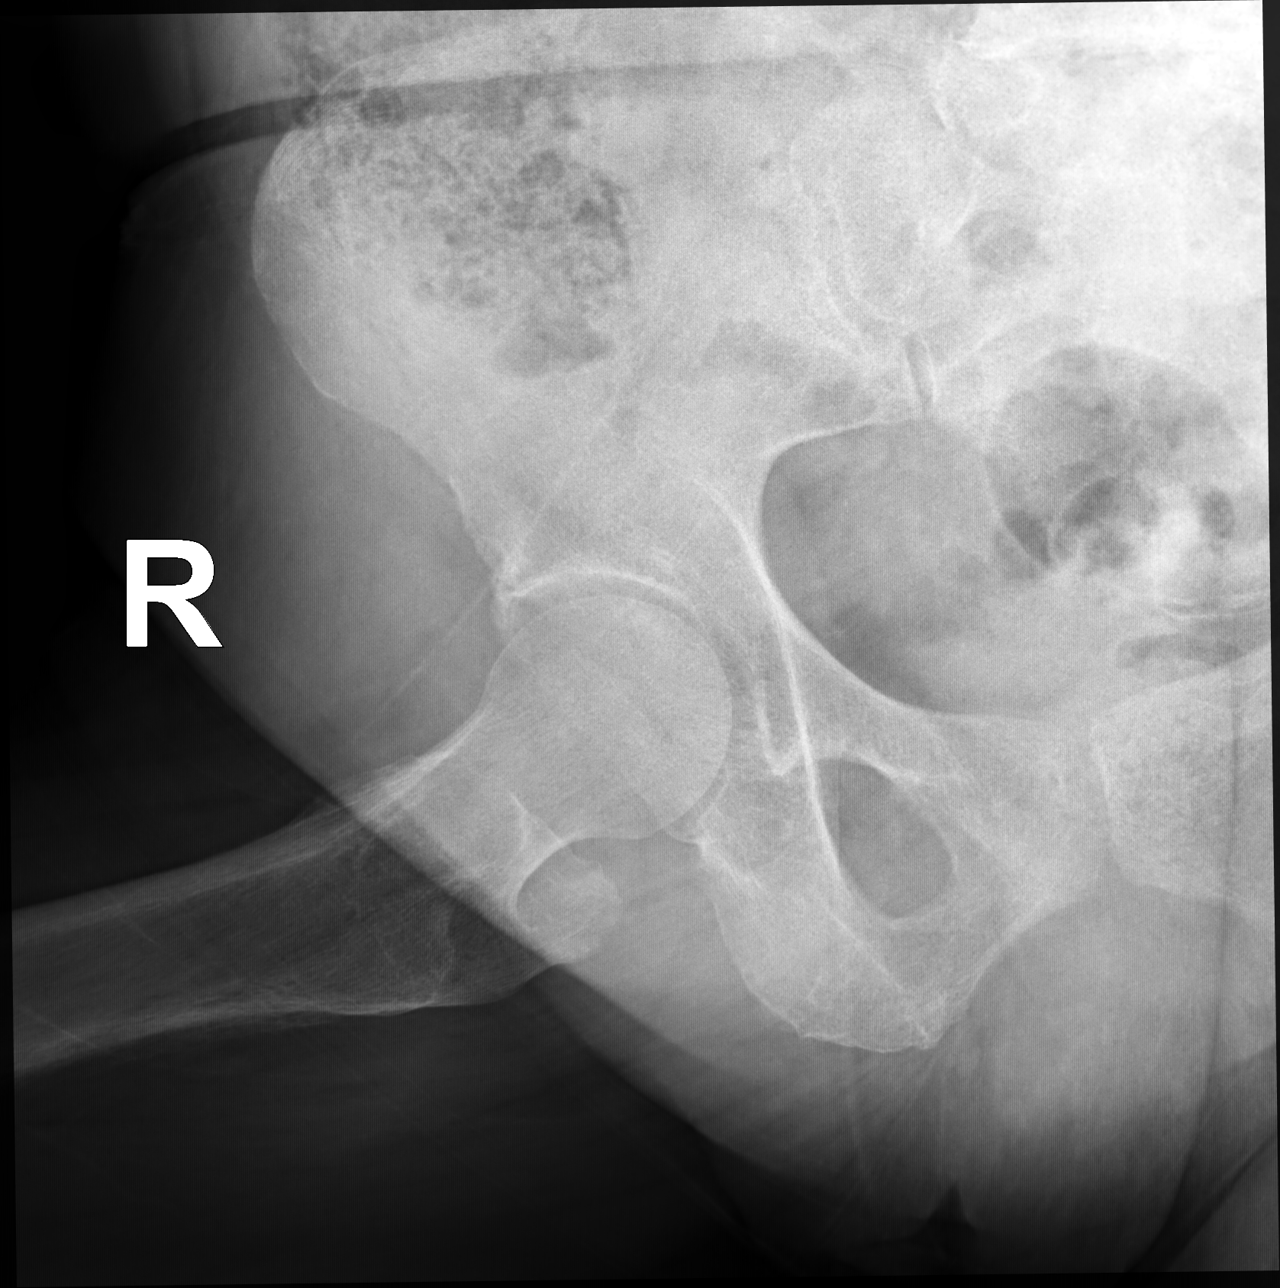

[3 of 3 positions shown; findings below may reference images not displayed]

FINDINGS: There is no evidence of hip fracture or dislocation. No significant
joint space narrowing is noted. Mild osteophyte formation of right
hip is noted.
IMPRESSION: Mild degenerative joint disease of right hip. No acute abnormality
is seen.

## 2023-08-28 ENCOUNTER — Inpatient Hospital Stay

## 2023-08-28 ENCOUNTER — Encounter: Payer: Self-pay | Admitting: Hematology and Oncology

## 2023-08-28 VITALS — BP 138/55 | HR 70 | Temp 98.5°F | Resp 20 | Wt 176.8 lb

## 2023-08-28 DIAGNOSIS — Z95828 Presence of other vascular implants and grafts: Secondary | ICD-10-CM

## 2023-08-28 DIAGNOSIS — Z171 Estrogen receptor negative status [ER-]: Secondary | ICD-10-CM

## 2023-08-28 DIAGNOSIS — Z5111 Encounter for antineoplastic chemotherapy: Secondary | ICD-10-CM | POA: Diagnosis not present

## 2023-08-28 LAB — CBC WITH DIFFERENTIAL (CANCER CENTER ONLY)
Abs Immature Granulocytes: 0.06 K/uL (ref 0.00–0.07)
Basophils Absolute: 0.1 K/uL (ref 0.0–0.1)
Basophils Relative: 1 %
Eosinophils Absolute: 0.2 K/uL (ref 0.0–0.5)
Eosinophils Relative: 3 %
HCT: 28.9 % — ABNORMAL LOW (ref 36.0–46.0)
Hemoglobin: 9.7 g/dL — ABNORMAL LOW (ref 12.0–15.0)
Immature Granulocytes: 1 %
Lymphocytes Relative: 23 %
Lymphs Abs: 1.5 K/uL (ref 0.7–4.0)
MCH: 30.1 pg (ref 26.0–34.0)
MCHC: 33.6 g/dL (ref 30.0–36.0)
MCV: 89.8 fL (ref 80.0–100.0)
Monocytes Absolute: 0.4 K/uL (ref 0.1–1.0)
Monocytes Relative: 7 %
Neutro Abs: 4.3 K/uL (ref 1.7–7.7)
Neutrophils Relative %: 65 %
Platelet Count: 327 K/uL (ref 150–400)
RBC: 3.22 MIL/uL — ABNORMAL LOW (ref 3.87–5.11)
RDW: 16.7 % — ABNORMAL HIGH (ref 11.5–15.5)
WBC Count: 6.6 K/uL (ref 4.0–10.5)
nRBC: 0 % (ref 0.0–0.2)

## 2023-08-28 LAB — CMP (CANCER CENTER ONLY)
ALT: 9 U/L (ref 0–44)
AST: 10 U/L — ABNORMAL LOW (ref 15–41)
Albumin: 3.4 g/dL — ABNORMAL LOW (ref 3.5–5.0)
Alkaline Phosphatase: 124 U/L (ref 38–126)
Anion gap: 5 (ref 5–15)
BUN: 11 mg/dL (ref 6–20)
CO2: 27 mmol/L (ref 22–32)
Calcium: 7.7 mg/dL — ABNORMAL LOW (ref 8.9–10.3)
Chloride: 109 mmol/L (ref 98–111)
Creatinine: 0.85 mg/dL (ref 0.44–1.00)
GFR, Estimated: >60 mL/min (ref >60)
Glucose, Bld: 99 mg/dL (ref 70–99)
Potassium: 3.8 mmol/L (ref 3.5–5.1)
Sodium: 141 mmol/L (ref 135–145)
Total Bilirubin: 0.2 mg/dL (ref 0.0–1.2)
Total Protein: 6 g/dL — ABNORMAL LOW (ref 6.5–8.1)

## 2023-08-28 MED ORDER — ONDANSETRON HCL 4 MG/2ML IJ SOLN
8.0000 mg | Freq: Once | INTRAMUSCULAR | Status: AC
  Administered 2023-08-28 (×2): 8 mg via INTRAVENOUS
  Filled 2023-08-28: qty 4

## 2023-08-28 MED ORDER — SODIUM CHLORIDE 0.9 % IV SOLN
65.0000 mg/m2 | Freq: Once | INTRAVENOUS | Status: AC
  Administered 2023-08-28 (×2): 126 mg via INTRAVENOUS
  Filled 2023-08-28: qty 21

## 2023-08-28 MED ORDER — SODIUM CHLORIDE 0.9% FLUSH: 10.0000 mL | INTRAVENOUS | Status: DC | PRN

## 2023-08-28 MED ORDER — ACETAMINOPHEN 325 MG PO TABS
650.0000 mg | ORAL_TABLET | Freq: Once | ORAL | Status: AC
  Administered 2023-08-28 (×2): 650 mg via ORAL
  Filled 2023-08-28: qty 2

## 2023-08-28 MED ORDER — SODIUM CHLORIDE 0.9 % IV SOLN: INTRAVENOUS | Status: DC

## 2023-08-28 MED ORDER — DIPHENHYDRAMINE HCL 50 MG/ML IJ SOLN
25.0000 mg | Freq: Once | INTRAMUSCULAR | Status: AC
  Administered 2023-08-28 (×2): 25 mg via INTRAVENOUS
  Filled 2023-08-28: qty 1

## 2023-08-28 MED ORDER — FAMOTIDINE IN NACL 20-0.9 MG/50ML-% IV SOLN
20.0000 mg | Freq: Once | INTRAVENOUS | Status: AC
  Administered 2023-08-28 (×2): 20 mg via INTRAVENOUS
  Filled 2023-08-28: qty 50

## 2023-08-28 MED ORDER — TRASTUZUMAB-ANNS CHEMO 150 MG IV SOLR
2.0000 mg/kg | Freq: Once | INTRAVENOUS | Status: AC
  Administered 2023-08-28 (×2): 150 mg via INTRAVENOUS
  Filled 2023-08-28: qty 7.14

## 2023-08-28 MED ORDER — SODIUM CHLORIDE 0.9% FLUSH
10.0000 mL | INTRAVENOUS | Status: DC | PRN
  Administered 2023-08-28 (×2): 10 mL

## 2023-08-28 NOTE — Patient Instructions (Signed)
 [facility] - A DEPT OF [facility]  Discharge Instructions: Thank you for choosing [facility] to provide your oncology and hematology care.   If you have a lab appointment with the Cancer Center, please go directly to the Cancer Center and check in at the registration area.   Wear comfortable clothing and clothing appropriate for easy access to any Portacath or PICC line.   We strive to give you quality time with your provider. You may need to reschedule your appointment if you arrive late (15 or more minutes).  Arriving late affects you and other patients whose appointments are after yours.  Also, if you miss three or more appointments without notifying the office, you may be dismissed from the clinic at the provider's discretion.      For prescription refill requests, have your pharmacy contact our office and allow 72 hours for refills to be completed.    Today you received the following chemotherapy and/or immunotherapy agents: Trastuzumab  & Taxol       To help prevent nausea and vomiting after your treatment, we encourage you to take your nausea medication as directed.  BELOW ARE SYMPTOMS THAT SHOULD BE REPORTED IMMEDIATELY: *FEVER GREATER THAN 100.4 F (38 C) OR HIGHER *CHILLS OR SWEATING *NAUSEA AND VOMITING THAT IS NOT CONTROLLED WITH YOUR NAUSEA MEDICATION *UNUSUAL SHORTNESS OF BREATH *UNUSUAL BRUISING OR BLEEDING *URINARY PROBLEMS (pain or burning when urinating, or frequent urination) *BOWEL PROBLEMS (unusual diarrhea, constipation, pain near the anus) TENDERNESS IN MOUTH AND THROAT WITH OR WITHOUT PRESENCE OF ULCERS (sore throat, sores in mouth, or a toothache) UNUSUAL RASH, SWELLING OR PAIN  UNUSUAL VAGINAL DISCHARGE OR ITCHING   Items with * indicate a potential emergency and should be followed up as soon as possible or go to the Emergency Department if any problems should occur.  Please show the CHEMOTHERAPY ALERT CARD or  IMMUNOTHERAPY ALERT CARD at check-in to the Emergency Department and triage nurse.  Should you have questions after your visit or need to cancel or reschedule your appointment, please contact [facility] - A DEPT OF [facility]  Dept: [phone]  and follow the prompts.  Office hours are 8:00 a.m. to 4:30 p.m. Monday - Friday. Please note that voicemails left after 4:00 p.m. may not be returned until the following business day.  We are closed weekends and major holidays. You have access to a nurse at all times for urgent questions. Please call the main number to the clinic Dept: [phone] and follow the prompts.   For any non-urgent questions, you may also contact your provider using MyChart. We now offer e-Visits for anyone 25 and older to request care online for non-urgent symptoms. For details visit [email or web address].   Also download the MyChart app! Go to the app store, search MyChart, open the app, select [facility], and log in with your MyChart username and password.

## 2023-08-28 NOTE — Progress Notes (Signed)
 Patient completed grant paperwork today.  She was approved for one-time $1000 [employer] grant to assist with personal expenses while going through treatment. She was given a copy of the approval letter and expense sheet in a green folder to review and call me at her earliest convenience.  She has my card to do so and for any additional financial questions or concerns.

## 2023-09-03 ENCOUNTER — Other Ambulatory Visit: Payer: Self-pay | Admitting: Hematology and Oncology

## 2023-09-03 DIAGNOSIS — Z171 Estrogen receptor negative status [ER-]: Secondary | ICD-10-CM

## 2023-09-05 ENCOUNTER — Encounter: Payer: Self-pay | Admitting: Adult Health

## 2023-09-05 ENCOUNTER — Inpatient Hospital Stay (HOSPITAL_BASED_OUTPATIENT_CLINIC_OR_DEPARTMENT_OTHER): Admitting: Adult Health

## 2023-09-05 ENCOUNTER — Other Ambulatory Visit (HOSPITAL_COMMUNITY): Payer: Self-pay

## 2023-09-05 ENCOUNTER — Inpatient Hospital Stay

## 2023-09-05 ENCOUNTER — Encounter: Payer: Self-pay | Admitting: Hematology and Oncology

## 2023-09-05 VITALS — BP 144/55 | HR 82 | Temp 99.3°F | Resp 18 | Wt 175.0 lb

## 2023-09-05 VITALS — BP 144/55 | HR 82 | Temp 99.3°F | Resp 18

## 2023-09-05 DIAGNOSIS — C50412 Malignant neoplasm of upper-outer quadrant of left female breast: Secondary | ICD-10-CM

## 2023-09-05 DIAGNOSIS — Z171 Estrogen receptor negative status [ER-]: Secondary | ICD-10-CM

## 2023-09-05 DIAGNOSIS — Z5111 Encounter for antineoplastic chemotherapy: Secondary | ICD-10-CM | POA: Diagnosis not present

## 2023-09-05 DIAGNOSIS — Z95828 Presence of other vascular implants and grafts: Secondary | ICD-10-CM

## 2023-09-05 LAB — CBC WITH DIFFERENTIAL (CANCER CENTER ONLY)
Abs Immature Granulocytes: 0.09 K/uL — ABNORMAL HIGH (ref 0.00–0.07)
Basophils Absolute: 0.1 K/uL (ref 0.0–0.1)
Basophils Relative: 1 %
Eosinophils Absolute: 0.2 K/uL (ref 0.0–0.5)
Eosinophils Relative: 2 %
HCT: 29.6 % — ABNORMAL LOW (ref 36.0–46.0)
Hemoglobin: 9.9 g/dL — ABNORMAL LOW (ref 12.0–15.0)
Immature Granulocytes: 1 %
Lymphocytes Relative: 22 %
Lymphs Abs: 1.6 K/uL (ref 0.7–4.0)
MCH: 29.7 pg (ref 26.0–34.0)
MCHC: 33.4 g/dL (ref 30.0–36.0)
MCV: 88.9 fL (ref 80.0–100.0)
Monocytes Absolute: 0.5 K/uL (ref 0.1–1.0)
Monocytes Relative: 7 %
Neutro Abs: 4.7 K/uL (ref 1.7–7.7)
Neutrophils Relative %: 66 %
Platelet Count: 358 K/uL (ref 150–400)
RBC: 3.33 MIL/uL — ABNORMAL LOW (ref 3.87–5.11)
RDW: 16.7 % — ABNORMAL HIGH (ref 11.5–15.5)
WBC Count: 7.1 K/uL (ref 4.0–10.5)
nRBC: 0 /100{WBCs}

## 2023-09-05 LAB — CMP (CANCER CENTER ONLY)
ALT: 9 U/L (ref 0–44)
AST: 12 U/L — ABNORMAL LOW (ref 15–41)
Albumin: 3.4 g/dL — ABNORMAL LOW (ref 3.5–5.0)
Alkaline Phosphatase: 133 U/L — ABNORMAL HIGH (ref 38–126)
Anion gap: 7 (ref 5–15)
BUN: 6 mg/dL (ref 6–20)
CO2: 29 mmol/L (ref 22–32)
Calcium: 7.7 mg/dL — ABNORMAL LOW (ref 8.9–10.3)
Chloride: 105 mmol/L (ref 98–111)
Creatinine: 0.77 mg/dL (ref 0.44–1.00)
GFR, Estimated: >60 mL/min (ref >60)
Glucose, Bld: 176 mg/dL — ABNORMAL HIGH (ref 70–99)
Potassium: 3.2 mmol/L — ABNORMAL LOW (ref 3.5–5.1)
Sodium: 141 mmol/L (ref 135–145)
Total Bilirubin: 0.2 mg/dL (ref 0.0–1.2)
Total Protein: 6.1 g/dL — ABNORMAL LOW (ref 6.5–8.1)

## 2023-09-05 MED ORDER — SODIUM CHLORIDE 0.9% FLUSH
10.0000 mL | INTRAVENOUS | Status: DC | PRN
  Administered 2023-09-05: 10 mL

## 2023-09-05 MED ORDER — SODIUM CHLORIDE 0.9 % IV SOLN: INTRAVENOUS | Status: DC

## 2023-09-05 MED ORDER — FAMOTIDINE IN NACL 20-0.9 MG/50ML-% IV SOLN
20.0000 mg | Freq: Once | INTRAVENOUS | Status: AC
  Administered 2023-09-05: 20 mg via INTRAVENOUS
  Filled 2023-09-05: qty 50

## 2023-09-05 MED ORDER — TRASTUZUMAB-ANNS CHEMO 150 MG IV SOLR
2.0000 mg/kg | Freq: Once | INTRAVENOUS | Status: AC
  Administered 2023-09-05: 150 mg via INTRAVENOUS
  Filled 2023-09-05: qty 7.14

## 2023-09-05 MED ORDER — AZITHROMYCIN 250 MG PO TABS
ORAL_TABLET | ORAL | 0 refills | Status: DC
  Filled 2023-09-05: qty 6, 5d supply, fill #0

## 2023-09-05 MED ORDER — DIPHENHYDRAMINE HCL 50 MG/ML IJ SOLN
25.0000 mg | Freq: Once | INTRAMUSCULAR | Status: AC
  Administered 2023-09-05: 25 mg via INTRAVENOUS
  Filled 2023-09-05: qty 1

## 2023-09-05 MED ORDER — ONDANSETRON HCL 4 MG/2ML IJ SOLN
8.0000 mg | Freq: Once | INTRAMUSCULAR | Status: AC
  Administered 2023-09-05: 8 mg via INTRAVENOUS
  Filled 2023-09-05: qty 4

## 2023-09-05 MED ORDER — SODIUM CHLORIDE 0.9 % IV SOLN
65.0000 mg/m2 | Freq: Once | INTRAVENOUS | Status: AC
  Administered 2023-09-05: 126 mg via INTRAVENOUS
  Filled 2023-09-05: qty 21

## 2023-09-05 MED ORDER — ACETAMINOPHEN 325 MG PO TABS
650.0000 mg | ORAL_TABLET | Freq: Once | ORAL | Status: AC
  Administered 2023-09-05: 650 mg via ORAL
  Filled 2023-09-05: qty 2

## 2023-09-05 NOTE — Patient Instructions (Signed)
 [facility] - A DEPT OF [facility]  Discharge Instructions: Thank you for choosing [facility] to provide your oncology and hematology care.   If you have a lab appointment with the Cancer Center, please go directly to the Cancer Center and check in at the registration area.   Wear comfortable clothing and clothing appropriate for easy access to any Portacath or PICC line.   We strive to give you quality time with your provider. You may need to reschedule your appointment if you arrive late (15 or more minutes).  Arriving late affects you and other patients whose appointments are after yours.  Also, if you miss three or more appointments without notifying the office, you may be dismissed from the clinic at the provider's discretion.      For prescription refill requests, have your pharmacy contact our office and allow 72 hours for refills to be completed.    Today you received the following chemotherapy and/or immunotherapy agents: Trastuzumab  & Taxol       To help prevent nausea and vomiting after your treatment, we encourage you to take your nausea medication as directed.  BELOW ARE SYMPTOMS THAT SHOULD BE REPORTED IMMEDIATELY: *FEVER GREATER THAN 100.4 F (38 C) OR HIGHER *CHILLS OR SWEATING *NAUSEA AND VOMITING THAT IS NOT CONTROLLED WITH YOUR NAUSEA MEDICATION *UNUSUAL SHORTNESS OF BREATH *UNUSUAL BRUISING OR BLEEDING *URINARY PROBLEMS (pain or burning when urinating, or frequent urination) *BOWEL PROBLEMS (unusual diarrhea, constipation, pain near the anus) TENDERNESS IN MOUTH AND THROAT WITH OR WITHOUT PRESENCE OF ULCERS (sore throat, sores in mouth, or a toothache) UNUSUAL RASH, SWELLING OR PAIN  UNUSUAL VAGINAL DISCHARGE OR ITCHING   Items with * indicate a potential emergency and should be followed up as soon as possible or go to the Emergency Department if any problems should occur.  Please show the CHEMOTHERAPY ALERT CARD or  IMMUNOTHERAPY ALERT CARD at check-in to the Emergency Department and triage nurse.  Should you have questions after your visit or need to cancel or reschedule your appointment, please contact [facility] - A DEPT OF [facility]  Dept: [phone]  and follow the prompts.  Office hours are 8:00 a.m. to 4:30 p.m. Monday - Friday. Please note that voicemails left after 4:00 p.m. may not be returned until the following business day.  We are closed weekends and major holidays. You have access to a nurse at all times for urgent questions. Please call the main number to the clinic Dept: [phone] and follow the prompts.   For any non-urgent questions, you may also contact your provider using MyChart. We now offer e-Visits for anyone 25 and older to request care online for non-urgent symptoms. For details visit [email or web address].   Also download the MyChart app! Go to the app store, search MyChart, open the app, select [facility], and log in with your MyChart username and password.

## 2023-09-05 NOTE — Progress Notes (Unsigned)
 [facility] Follow up:    Pcp, No No address on file   DIAGNOSIS: Cancer Staging  Malignant neoplasm of upper-outer quadrant of left breast in female, estrogen receptor negative (HCC) Staging form: Breast, AJCC 8th Edition - Clinical: Stage IA (cT1c, cN0, cM0, G2, ER-, PR-, HER2+) - Signed by Odean Potts, MD on 06/07/2023 Stage prefix: Initial diagnosis Histologic grading system: 3 grade system    SUMMARY OF ONCOLOGIC HISTORY: Oncology History  Malignant neoplasm of upper-outer quadrant of left breast in female, estrogen receptor negative (HCC)  05/31/2023 Initial Diagnosis   Screening mammogram detected left breast mass and asymmetry retroareolar region, irregular spiculated mass 1.1 cm, axilla negative, ultrasound measured 1.2 cm, biopsy: Grade 2 IDC ER 0% PR 0% Ki67 15%, HER2 3+ positive   06/07/2023 Cancer Staging   Staging form: Breast, AJCC 8th Edition - Clinical: Stage IA (cT1c, cN0, cM0, G2, ER-, PR-, HER2+) - Signed by Odean Potts, MD on 06/07/2023 Stage prefix: Initial diagnosis Histologic grading system: 3 grade system   08/07/2023 -  Chemotherapy   Patient is on Treatment Plan : BREAST Paclitaxel  + Trastuzumab  q7d / Trastuzumab  q21d       CURRENT THERAPY:  INTERVAL HISTORY:  Discussed the use of AI scribe software for clinical note transcription with the patient, who gave verbal consent to proceed.  Mary Campbell 57 y.o. female returns for    Patient Active Problem List   Diagnosis Date Noted   Port-A-Cath in place 08/07/2023   Severe obstructive sleep apnea 06/19/2023   Antiplatelet or antithrombotic long-term use 06/15/2023   Positive colorectal cancer screening using Cologuard test 06/15/2023   Malignant neoplasm of upper-outer quadrant of left breast in female, estrogen receptor negative (HCC) 06/05/2023   Excessive daytime sleepiness 12/22/2022   DOE (dyspnea on exertion) 12/22/2022   Centrilobular emphysema (HCC) 12/22/2022    Claudication in peripheral vascular disease (HCC) 10/04/2018   Essential hypertension 10/04/2018   Hyperlipidemia 10/04/2018   Tobacco abuse 10/04/2018   Family history of heart disease 10/04/2018    is allergic to codeine, other, sulfonamide derivatives, and lisinopril.  MEDICAL HISTORY: Past Medical History:  Diagnosis Date   Anxiety    Arthritis    Breast cancer (HCC)    left   Chronic back pain    COPD (chronic obstructive pulmonary disease) (HCC)    Diabetes mellitus    Difficult intubation    Diverticulitis    Dyspnea    with exertion   Fibromyalgia    Hyperlipidemia    Hypertension    PAD (peripheral artery disease) (HCC)    a. PV angio 10/2018 - bilateral SFA disease s/p L intervention.   PAD (peripheral artery disease) (HCC)    PONV (postoperative nausea and vomiting)    Sleep apnea    Tobacco abuse     SURGICAL HISTORY: Past Surgical History:  Procedure Laterality Date   ABDOMINAL AORTOGRAM W/LOWER EXTREMITY Right 10/18/2018   Procedure: ABDOMINAL AORTOGRAM W/LOWER EXTREMITY;  Surgeon: Court Dorn PARAS, MD;  Location: [facility];  Service: Cardiovascular;  Laterality: Right;   ABDOMINAL AORTOGRAM W/LOWER EXTREMITY N/A 04/02/2020   Procedure: ABDOMINAL AORTOGRAM W/LOWER EXTREMITY;  Surgeon: Court Dorn PARAS, MD;  Location: [facility];  Service: Cardiovascular;  Laterality: N/A;   ABDOMINAL AORTOGRAM W/LOWER EXTREMITY N/A 04/23/2020   Procedure: ABDOMINAL AORTOGRAM W/LOWER EXTREMITY;  Surgeon: Court Dorn PARAS, MD;  Location: [facility];  Service: Cardiovascular;  Laterality: N/A;   ABDOMINAL HYSTERECTOMY  BREAST BIOPSY Left 05/31/2023   US  LT BREAST BX W LOC DEV 1ST LESION IMG BX SPEC US  GUIDE 05/31/2023 [facility]   BREAST BIOPSY  06/27/2023   MM LT RADIOACTIVE SEED LOC MAMMO GUIDE 06/27/2023 [facility]   BREAST LUMPECTOMY WITH RADIOACTIVE SEED AND SENTINEL LYMPH NODE BIOPSY Left 06/29/2023   Procedure: BREAST LUMPECTOMY  WITH RADIOACTIVE SEED AND SENTINEL LYMPH NODE BIOPSY;  Surgeon: Curvin Deward MOULD, MD;  Location: [facility];  Service: General;  Laterality: Left;  LEFT BREAST RADIOACTIVE SEED LOCALIZED LUMPECTOMY   SENTINEL NODE BIOPSY   CHOLECYSTECTOMY     PERIPHERAL VASCULAR ATHERECTOMY Left 10/18/2018   Procedure: PERIPHERAL VASCULAR ATHERECTOMY;  Surgeon: Court Dorn PARAS, MD;  [facility];  Service: Cardiovascular;  Laterality: Left;  SFA   PERIPHERAL VASCULAR ATHERECTOMY Right 11/22/2018   Procedure: PERIPHERAL VASCULAR ATHERECTOMY;  Surgeon: Court Dorn PARAS, MD;  Location: [facility];  Service: Cardiovascular;  Laterality: Right;  SFA   PERIPHERAL VASCULAR ATHERECTOMY  04/23/2020   Procedure: PERIPHERAL VASCULAR ATHERECTOMY;  Surgeon: Court Dorn PARAS, MD;  [facility];  Service: Cardiovascular;;   PERIPHERAL VASCULAR BALLOON ANGIOPLASTY Right 11/22/2018   Procedure: PERIPHERAL VASCULAR BALLOON ANGIOPLASTY;  Surgeon: Court Dorn PARAS, MD;  [facility];  Service: Cardiovascular;  Laterality: Right;  SFA   PERIPHERAL VASCULAR BALLOON ANGIOPLASTY  04/23/2020   Procedure: PERIPHERAL VASCULAR BALLOON ANGIOPLASTY;  Surgeon: Court Dorn PARAS, MD;  [facility];  Service: Cardiovascular;;   PERIPHERAL VASCULAR INTERVENTION Left 04/02/2020   Procedure: PERIPHERAL VASCULAR INTERVENTION;  Surgeon: Court Dorn PARAS, MD;  [facility];  Service: Cardiovascular;  Laterality: Left;   PORTACATH PLACEMENT N/A 06/29/2023   Procedure: INSERTION, TUNNELED CENTRAL VENOUS DEVICE, WITH PORT;  Surgeon: Curvin Deward MOULD, MD;  Location: [facility];  Service: General;  Laterality: N/A;  PORT PLACEMENT WITH ULTRASOUND GUIDANCE GEN w/PEC BLOCK    SOCIAL HISTORY: Social History   Socioeconomic History   Marital status: Legally Separated    Spouse name: Not on file   Number of children: 2   Years of education: Not on file   Highest education level: Not  on file  Occupational History   Occupation: disabled  Tobacco Use   Smoking status: Some Days    Current packs/day: 0.00    Types: Cigarettes    Last attempt to quit: 08/11/2022    Years since quitting: 1.0   Smokeless tobacco: Never   Tobacco comments:    Trying quit with patch  Vaping Use   Vaping status: Never Used  Substance and Sexual Activity   Alcohol use: No   Drug use: No   Sexual activity: Yes    Birth control/protection: Surgical  Other Topics Concern   Not on file  Social History Narrative   Not on file   Social Drivers of Health   Financial Resource Strain: Not on file  Food Insecurity: No Food Insecurity (06/07/2023)   Hunger Vital Sign    Worried About Running Out of Food in the Last Year: Never true    Ran Out of Food in the Last Year: Never true  Transportation Needs: No Transportation Needs (06/07/2023)   PRAPARE - Administrator, Civil Service (Medical): No    Lack of Transportation (Non-Medical): No  Physical Activity: Not on file  Stress: Not on file  Social Connections: Not on file  Intimate Partner Violence: Not At Risk (06/07/2023)  Humiliation, Afraid, Rape, and Kick questionnaire    Fear of Current or Ex-Partner: No    Emotionally Abused: No    Physically Abused: No    Sexually Abused: No    FAMILY HISTORY: Family History  Problem Relation Age of Onset   Diabetes Mother    Hypertension Mother    Hyperlipidemia Mother    Heart attack Father    Heart disease Father    Hypertension Father    Hyperlipidemia Father    Breast cancer Neg Hx    Colon cancer Neg Hx    Esophageal cancer Neg Hx     Review of Systems - Oncology    PHYSICAL EXAMINATION    There were no vitals filed for this visit.  Physical Exam  LABORATORY DATA:  CBC    Component Value Date/Time   WBC 6.6 08/28/2023 1337   WBC 9.4 07/26/2023 1025   RBC 3.22 (L) 08/28/2023 1337   HGB 9.7 (L) 08/28/2023 1337   HGB 12.0 04/15/2020 1537   HCT 28.9 (L)  08/28/2023 1337   HCT 36.5 04/15/2020 1537   PLT 327 08/28/2023 1337   PLT 323 04/15/2020 1537   MCV 89.8 08/28/2023 1337   MCV 87 04/15/2020 1537   MCH 30.1 08/28/2023 1337   MCHC 33.6 08/28/2023 1337   RDW 16.7 (H) 08/28/2023 1337   RDW 14.4 04/15/2020 1537   LYMPHSABS 1.5 08/28/2023 1337   LYMPHSABS 2.5 10/25/2018 1552   MONOABS 0.4 08/28/2023 1337   EOSABS 0.2 08/28/2023 1337   EOSABS 0.2 10/25/2018 1552   BASOSABS 0.1 08/28/2023 1337   BASOSABS 0.1 10/25/2018 1552    CMP     Component Value Date/Time   NA 141 08/28/2023 1337   NA 139 04/15/2020 1537   K 3.8 08/28/2023 1337   CL 109 08/28/2023 1337   CO2 27 08/28/2023 1337   GLUCOSE 99 08/28/2023 1337   BUN 11 08/28/2023 1337   BUN 9 04/15/2020 1537   CREATININE 0.85 08/28/2023 1337   CALCIUM  7.7 (L) 08/28/2023 1337   PROT 6.0 (L) 08/28/2023 1337   PROT 7.0 03/11/2020 1005   ALBUMIN 3.4 (L) 08/28/2023 1337   ALBUMIN 4.3 03/11/2020 1005   AST 10 (L) 08/28/2023 1337   ALT 9 08/28/2023 1337   ALKPHOS 124 08/28/2023 1337   BILITOT 0.2 08/28/2023 1337   GFRNONAA >60 08/28/2023 1337   GFRAA 73 03/11/2020 1005     ASSESSMENT and THERAPY PLAN:   No problem-specific Assessment & Plan notes found for this encounter.     All questions were answered. The patient knows to call the clinic with any problems, questions or concerns. We can certainly see the patient much sooner if necessary.  Total encounter time:*** minutes*in face-to-face visit time, chart review, lab review, care coordination, order entry, and documentation of the encounter time.    Morna Kendall, NP 09/05/23 9:51 AM Medical Oncology and Hematology [facility] [address] [place], [place] [address] Tel. [phone]    Fax. [phone]  *Total Encounter Time as defined by the Centers for Medicare and Medicaid Services includes, in addition to the face-to-face time of a patient visit (documented in the note above)  non-face-to-face time: obtaining and reviewing outside history, ordering and reviewing medications, tests or procedures, care coordination (communications with other health care professionals or caregivers) and documentation in the medical record.

## 2023-09-06 ENCOUNTER — Ambulatory Visit (INDEPENDENT_AMBULATORY_CARE_PROVIDER_SITE_OTHER): Admitting: Pulmonary Disease

## 2023-09-06 VITALS — BP 148/69 | HR 94 | Ht 64.5 in | Wt 177.2 lb

## 2023-09-06 DIAGNOSIS — G4733 Obstructive sleep apnea (adult) (pediatric): Secondary | ICD-10-CM | POA: Diagnosis not present

## 2023-09-06 NOTE — Patient Instructions (Signed)
 Follow-up in about 4 months  Continue using your CPAP nightly - Download from your machine shows that is working well  For your lung disease - Continue to use your Stiolto, use albuterol  as needed  Make sure you continue exercising  Good luck with your ongoing treatment for your breast cancer  Call us  with significant concerns

## 2023-09-06 NOTE — Progress Notes (Unsigned)
 Mary Campbell    [id number]    1965/09/06  Primary Care Physician:Pcp, No  Referring Physician: Valma Carwin, MD [id number] [place],  [place] [address]  Chief complaint:   Patient being seen for obstructive sleep apnea, obstructive lung disease  HPI:  Diagnosed with severe obstructive sleep apnea - Has been using CPAP nightly - Tolerating CPAP well - Benefiting from CPAP use  History of obstructive lung disease - Started on Stiolto which she is tolerating well - Uses albuterol  as needed  Recent history of breast cancer for which she had lumpectomy and now on chemotherapy with Dr. Odean - Has had 5 sessions of treatment - Tolerating treatment well - Gets tired after treatment  She has been able to stay active and continues to exercise  She is sleeping better usually goes to bed between 10 and 11, wakes up between 6 and 7 AM  History of hypertension, diabetes, recent diagnosis of breast cancer for which she is on treatment  Quit smoking in June 2025  History of anxiety, depression  Outpatient Encounter Medications as of 09/06/2023  Medication Sig   acetaminophen  (TYLENOL ) 650 MG CR tablet Take 1,300 mg by mouth in the morning, at noon, and at bedtime.   albuterol  (VENTOLIN  HFA) 108 (90 Base) MCG/ACT inhaler Inhale 2 puffs into the lungs every 6 (six) hours as needed for wheezing or shortness of breath.   amLODipine-benazepril (LOTREL) 5-20 MG capsule Take 1 capsule by mouth in the morning.   aspirin  EC 81 MG tablet Take 81 mg by mouth in the morning.   azithromycin  (ZITHROMAX ) 250 MG tablet Take 2 tablets on day 1, then 1 tablet daily until complete.   buPROPion (WELLBUTRIN XL) 150 MG 24 hr tablet Take 150 mg by mouth in the morning.   clobetasol cream (TEMOVATE) 0.05 % Apply 1 Application topically 2 (two) times daily as needed (skin irritation).   clopidogrel  (PLAVIX ) 75 MG tablet Take 1 tablet (75 mg total) by mouth daily.   cyanocobalamin  (VITAMIN B12)  500 MCG tablet Take 2 tablets (1,000 mcg total) by mouth daily.   DULoxetine (CYMBALTA) 30 MG capsule Take 30 mg by mouth in the morning. Total dose 90 mg (30 mg and 60 mg)   DULoxetine (CYMBALTA) 60 MG capsule Take 60 mg by mouth in the morning. Total dose 90 mg (30 mg and 60 mg)   ferrous gluconate  (FERGON) 324 MG tablet Take 1 tablet (324 mg total) by mouth daily with breakfast.   fluticasone  (FLONASE ) 50 MCG/ACT nasal spray Place 1-2 sprays into both nostrils daily.   gabapentin  (NEURONTIN ) 100 MG capsule Take 300 mg by mouth 2 (two) times daily.   JANUMET XR 50-1000 MG TB24 Take 1 tablet by mouth in the morning and at bedtime.   LANTUS  SOLOSTAR 100 UNIT/ML Solostar Pen Inject 50 Units into the skin at bedtime.   lidocaine -prilocaine  (EMLA ) cream Apply to affected area once   nicotine (NICODERM CQ - DOSED IN MG/24 HOURS) 21 mg/24hr patch Place 1 patch onto the skin daily.   ondansetron  (ZOFRAN ) 8 MG tablet TAKE 1 TABLET EVERY 8 HOURS AS NEEDED FOR NAUSEA OR VOMITING   prochlorperazine  (COMPAZINE ) 10 MG tablet TAKE 1 TABLET EVERY 6 HOURS AS NEEDED FOR NAUSEA OR VOMITING   rosuvastatin  (CRESTOR ) 40 MG tablet Take 1 tablet (40 mg total) by mouth daily.   Tiotropium Bromide-Olodaterol (STIOLTO RESPIMAT ) 2.5-2.5 MCG/ACT AERS Inhale 2 puffs into the lungs daily.  Facility-Administered Encounter Medications as of 09/06/2023  Medication   sodium chloride  flush (NS) 0.9 % injection 3 mL   sodium chloride  flush (NS) 0.9 % injection 3 mL    Allergies as of 09/06/2023 - Review Complete 09/05/2023  Allergen Reaction Noted   Codeine Hives 11/27/2010   Other Other (See Comments) 06/20/2023   Sulfonamide derivatives Hives 11/27/2010   Lisinopril Cough 10/13/2012    Past Medical History:  Diagnosis Date   Anxiety    Arthritis    Breast cancer (HCC)    left   Chronic back pain    COPD (chronic obstructive pulmonary disease) (HCC)    Diabetes mellitus    Difficult intubation    Diverticulitis     Dyspnea    with exertion   Fibromyalgia    Hyperlipidemia    Hypertension    PAD (peripheral artery disease) (HCC)    a. PV angio 10/2018 - bilateral SFA disease s/p L intervention.   PAD (peripheral artery disease) (HCC)    PONV (postoperative nausea and vomiting)    Sleep apnea    Tobacco abuse     Past Surgical History:  Procedure Laterality Date   ABDOMINAL AORTOGRAM W/LOWER EXTREMITY Right 10/18/2018   Procedure: ABDOMINAL AORTOGRAM W/LOWER EXTREMITY;  Surgeon: Court Dorn PARAS, MD;  Location: [facility];  Service: Cardiovascular;  Laterality: Right;   ABDOMINAL AORTOGRAM W/LOWER EXTREMITY N/A 04/02/2020   Procedure: ABDOMINAL AORTOGRAM W/LOWER EXTREMITY;  Surgeon: Court Dorn PARAS, MD;  Location: [facility];  Service: Cardiovascular;  Laterality: N/A;   ABDOMINAL AORTOGRAM W/LOWER EXTREMITY N/A 04/23/2020   Procedure: ABDOMINAL AORTOGRAM W/LOWER EXTREMITY;  Surgeon: Court Dorn PARAS, MD;  Location: [facility];  Service: Cardiovascular;  Laterality: N/A;   ABDOMINAL HYSTERECTOMY     BREAST BIOPSY Left 05/31/2023   US  LT BREAST BX W LOC DEV 1ST LESION IMG BX SPEC US  GUIDE 05/31/2023 [facility]   BREAST BIOPSY  06/27/2023   MM LT RADIOACTIVE SEED LOC MAMMO GUIDE 06/27/2023 [facility]   BREAST LUMPECTOMY WITH RADIOACTIVE SEED AND SENTINEL LYMPH NODE BIOPSY Left 06/29/2023   Procedure: BREAST LUMPECTOMY WITH RADIOACTIVE SEED AND SENTINEL LYMPH NODE BIOPSY;  Surgeon: Curvin Deward MOULD, MD;  Location: [facility];  Service: General;  Laterality: Left;  LEFT BREAST RADIOACTIVE SEED LOCALIZED LUMPECTOMY   SENTINEL NODE BIOPSY   CHOLECYSTECTOMY     PERIPHERAL VASCULAR ATHERECTOMY Left 10/18/2018   Procedure: PERIPHERAL VASCULAR ATHERECTOMY;  Surgeon: Court Dorn PARAS, MD;  Location: [facility];  Service: Cardiovascular;  Laterality: Left;  SFA   PERIPHERAL VASCULAR ATHERECTOMY Right 11/22/2018   Procedure: PERIPHERAL VASCULAR ATHERECTOMY;  Surgeon:  Court Dorn PARAS, MD;  Location: [facility];  Service: Cardiovascular;  Laterality: Right;  SFA   PERIPHERAL VASCULAR ATHERECTOMY  04/23/2020   Procedure: PERIPHERAL VASCULAR ATHERECTOMY;  Surgeon: Court Dorn PARAS, MD;  Location: [facility] CV [facility];  Service: Cardiovascular;;   PERIPHERAL VASCULAR BALLOON ANGIOPLASTY Right 11/22/2018   Procedure: PERIPHERAL VASCULAR BALLOON ANGIOPLASTY;  Surgeon: Court Dorn PARAS, MD;  Location: [facility];  Service: Cardiovascular;  Laterality: Right;  SFA   PERIPHERAL VASCULAR BALLOON ANGIOPLASTY  04/23/2020   Procedure: PERIPHERAL VASCULAR BALLOON ANGIOPLASTY;  Surgeon: Court Dorn PARAS, MD;  Location: [facility];  Service: Cardiovascular;;   PERIPHERAL VASCULAR INTERVENTION Left 04/02/2020   Procedure: PERIPHERAL VASCULAR INTERVENTION;  Surgeon: Court Dorn PARAS, MD;  Location: [facility];  Service: Cardiovascular;  Laterality: Left;  PORTACATH PLACEMENT N/A 06/29/2023   Procedure: INSERTION, TUNNELED CENTRAL VENOUS DEVICE, WITH PORT;  Surgeon: Curvin Deward MOULD, MD;  Location: [facility];  Service: General;  Laterality: N/A;  PORT PLACEMENT WITH ULTRASOUND GUIDANCE GEN w/PEC BLOCK    Family History  Problem Relation Age of Onset   Diabetes Mother    Hypertension Mother    Hyperlipidemia Mother    Heart attack Father    Heart disease Father    Hypertension Father    Hyperlipidemia Father    Breast cancer Neg Hx    Colon cancer Neg Hx    Esophageal cancer Neg Hx     Social History   Socioeconomic History   Marital status: Legally Separated    Spouse name: Not on file   Number of children: 2   Years of education: Not on file   Highest education level: Not on file  Occupational History   Occupation: disabled  Tobacco Use   Smoking status: Some Days    Current packs/day: 0.00    Types: Cigarettes    Last attempt to quit: 08/11/2022    Years since quitting: 1.0   Smokeless tobacco: Never   Tobacco comments:     Trying quit with patch  Vaping Use   Vaping status: Never Used  Substance and Sexual Activity   Alcohol use: No   Drug use: No   Sexual activity: Yes    Birth control/protection: Surgical  Other Topics Concern   Not on file  Social History Narrative   Not on file   Social Drivers of Health   Financial Resource Strain: Not on file  Food Insecurity: No Food Insecurity (06/07/2023)   Hunger Vital Sign    Worried About Running Out of Food in the Last Year: Never true    Ran Out of Food in the Last Year: Never true  Transportation Needs: No Transportation Needs (06/07/2023)   PRAPARE - Administrator, Civil Service (Medical): No    Lack of Transportation (Non-Medical): No  Physical Activity: Not on file  Stress: Not on file  Social Connections: Not on file  Intimate Partner Violence: Not At Risk (06/07/2023)   Humiliation, Afraid, Rape, and Kick questionnaire    Fear of Current or Ex-Partner: No    Emotionally Abused: No    Physically Abused: No    Sexually Abused: No    Review of Systems  Respiratory:  Positive for apnea and shortness of breath.     Vitals:   09/06/23 0827  BP: (!) 148/69  Pulse: 94  SpO2: 97%     Physical Exam Constitutional:      Appearance: She is obese.  HENT:     Head: Normocephalic.     Mouth/Throat:     Pharynx: Oropharynx is clear.  Eyes:     General: No scleral icterus. Cardiovascular:     Rate and Rhythm: Normal rate and regular rhythm.     Heart sounds: No murmur heard.    No friction rub.  Pulmonary:     Effort: No respiratory distress.     Breath sounds: No stridor. No wheezing or rhonchi.  Musculoskeletal:     Cervical back: No rigidity or tenderness.  Neurological:     Mental Status: She is alert.  Psychiatric:        Mood and Affect: Mood normal.      Data Reviewed: CPAP compliance reviewed showing excellent compliance Auto CPAP 5-15 95 percentile pressure of 13.5 will residual AHI  of 2.4 Average use of 5  hours 11 minutes  Assessment/Plan:  Obstructive sleep apnea - Continue using CPAP nightly  Chronic obstructive pulmonary disease - On Stiolto currently - Continues on Stiolto  Breast cancer - On treatment and tolerating treatment okay  Follow-up in 4 months  Encouraged to call with significant concerns  Graded activities as tolerated  Jennet Epley MD [facility] 09/06/2023, 8:33 AM  CC: Valma Carwin, MD

## 2023-09-07 ENCOUNTER — Other Ambulatory Visit: Payer: Self-pay

## 2023-09-08 ENCOUNTER — Other Ambulatory Visit: Admitting: Licensed Clinical Social Worker

## 2023-09-08 ENCOUNTER — Encounter: Payer: Self-pay | Admitting: Hematology and Oncology

## 2023-09-08 NOTE — Assessment & Plan Note (Signed)
 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 Echocardiogram shows normal ejection fraction. Experiencing hair loss as a side effect. Planned recovery period post-chemotherapy before radiation. Herceptin  to continue every three weeks. Labs within parameters, Echo due in 09/2023 and ordered. - Continue Taxol  and Herceptin  treatment as planned.  Chemotherapy-induced nausea and diarrhea Nausea and diarrhea have improved. Currently experiencing loose stools managed with Imodium. - Continue Imodium as needed for loose stools.  Low grade fever and upper respiratory symptoms Reports low grade fever, nasal sores, sneezing, and congestion. Question early bacterial sinusitis. - Prescribe Z-Pak (azithromycin ).  Heartburn Heartburn managed with Tums, providing relief. - Continue Tums as needed for heartburn.  RTC weekly for labs, f/u, treatment.

## 2023-09-11 ENCOUNTER — Inpatient Hospital Stay

## 2023-09-11 ENCOUNTER — Inpatient Hospital Stay (HOSPITAL_BASED_OUTPATIENT_CLINIC_OR_DEPARTMENT_OTHER): Admitting: Adult Health

## 2023-09-11 ENCOUNTER — Other Ambulatory Visit (HOSPITAL_COMMUNITY): Payer: Self-pay

## 2023-09-11 ENCOUNTER — Other Ambulatory Visit: Payer: Self-pay

## 2023-09-11 ENCOUNTER — Encounter: Payer: Self-pay | Admitting: Adult Health

## 2023-09-11 ENCOUNTER — Encounter: Payer: Self-pay | Admitting: Hematology and Oncology

## 2023-09-11 VITALS — BP 149/64 | HR 77 | Temp 98.4°F | Resp 18

## 2023-09-11 DIAGNOSIS — Z171 Estrogen receptor negative status [ER-]: Secondary | ICD-10-CM

## 2023-09-11 DIAGNOSIS — Z95828 Presence of other vascular implants and grafts: Secondary | ICD-10-CM

## 2023-09-11 DIAGNOSIS — Z5111 Encounter for antineoplastic chemotherapy: Secondary | ICD-10-CM | POA: Diagnosis not present

## 2023-09-11 DIAGNOSIS — R609 Edema, unspecified: Secondary | ICD-10-CM | POA: Diagnosis not present

## 2023-09-11 DIAGNOSIS — C50412 Malignant neoplasm of upper-outer quadrant of left female breast: Secondary | ICD-10-CM

## 2023-09-11 LAB — CBC WITH DIFFERENTIAL (CANCER CENTER ONLY)
Abs Immature Granulocytes: 0.04 K/uL (ref 0.00–0.07)
Basophils Absolute: 0.1 K/uL (ref 0.0–0.1)
Basophils Relative: 1 %
Eosinophils Absolute: 0.2 K/uL (ref 0.0–0.5)
Eosinophils Relative: 2 %
HCT: 28.2 % — ABNORMAL LOW (ref 36.0–46.0)
Hemoglobin: 9.4 g/dL — ABNORMAL LOW (ref 12.0–15.0)
Immature Granulocytes: 1 %
Lymphocytes Relative: 24 %
Lymphs Abs: 1.7 K/uL (ref 0.7–4.0)
MCH: 30 pg (ref 26.0–34.0)
MCHC: 33.3 g/dL (ref 30.0–36.0)
MCV: 90.1 fL (ref 80.0–100.0)
Monocytes Absolute: 0.4 K/uL (ref 0.1–1.0)
Monocytes Relative: 6 %
Neutro Abs: 4.9 K/uL (ref 1.7–7.7)
Neutrophils Relative %: 66 %
Platelet Count: 336 K/uL (ref 150–400)
RBC: 3.13 MIL/uL — ABNORMAL LOW (ref 3.87–5.11)
RDW: 17 % — ABNORMAL HIGH (ref 11.5–15.5)
WBC Count: 7.3 K/uL (ref 4.0–10.5)
nRBC: 0 % (ref 0.0–0.2)

## 2023-09-11 LAB — CMP (CANCER CENTER ONLY)
ALT: 8 U/L (ref 0–44)
AST: 12 U/L — ABNORMAL LOW (ref 15–41)
Albumin: 3.3 g/dL — ABNORMAL LOW (ref 3.5–5.0)
Alkaline Phosphatase: 119 U/L (ref 38–126)
Anion gap: 6 (ref 5–15)
BUN: 5 mg/dL — ABNORMAL LOW (ref 6–20)
CO2: 29 mmol/L (ref 22–32)
Calcium: 7.3 mg/dL — ABNORMAL LOW (ref 8.9–10.3)
Chloride: 105 mmol/L (ref 98–111)
Creatinine: 0.7 mg/dL (ref 0.44–1.00)
GFR, Estimated: >60 mL/min (ref >60)
Glucose, Bld: 126 mg/dL — ABNORMAL HIGH (ref 70–99)
Potassium: 3.3 mmol/L — ABNORMAL LOW (ref 3.5–5.1)
Sodium: 140 mmol/L (ref 135–145)
Total Bilirubin: 0.2 mg/dL (ref 0.0–1.2)
Total Protein: 5.8 g/dL — ABNORMAL LOW (ref 6.5–8.1)

## 2023-09-11 MED ORDER — ONDANSETRON HCL 4 MG/2ML IJ SOLN
8.0000 mg | Freq: Once | INTRAMUSCULAR | Status: AC
  Administered 2023-09-11: 8 mg via INTRAVENOUS
  Filled 2023-09-11: qty 4

## 2023-09-11 MED ORDER — TRIAMCINOLONE ACETONIDE 0.025 % EX CREA
1.0000 | TOPICAL_CREAM | Freq: Two times a day (BID) | CUTANEOUS | 0 refills | Status: AC
  Filled 2023-09-11 (×2): qty 30, 15d supply, fill #0

## 2023-09-11 MED ORDER — SODIUM CHLORIDE 0.9% FLUSH
10.0000 mL | INTRAVENOUS | Status: DC | PRN
  Administered 2023-09-11: 10 mL

## 2023-09-11 MED ORDER — FAMOTIDINE IN NACL 20-0.9 MG/50ML-% IV SOLN
20.0000 mg | Freq: Once | INTRAVENOUS | Status: AC
  Administered 2023-09-11: 20 mg via INTRAVENOUS
  Filled 2023-09-11: qty 50

## 2023-09-11 MED ORDER — DIPHENHYDRAMINE HCL 50 MG/ML IJ SOLN
25.0000 mg | Freq: Once | INTRAMUSCULAR | Status: AC
  Administered 2023-09-11: 25 mg via INTRAVENOUS
  Filled 2023-09-11: qty 1

## 2023-09-11 MED ORDER — TRASTUZUMAB-ANNS CHEMO 150 MG IV SOLR
2.0000 mg/kg | Freq: Once | INTRAVENOUS | Status: AC
  Administered 2023-09-11: 150 mg via INTRAVENOUS
  Filled 2023-09-11: qty 7.14

## 2023-09-11 MED ORDER — COLD PACK MISC ONCOLOGY
1.0000 | Freq: Once | Status: DC | PRN
Start: 2023-09-11 — End: 2023-09-11

## 2023-09-11 MED ORDER — SODIUM CHLORIDE 0.9 % IV SOLN
INTRAVENOUS | Status: DC
Start: 2023-09-11 — End: 2023-09-11

## 2023-09-11 MED ORDER — FUROSEMIDE 20 MG PO TABS
ORAL_TABLET | ORAL | 0 refills | Status: DC
  Filled 2023-09-11: qty 30, 30d supply, fill #0

## 2023-09-11 MED ORDER — SODIUM CHLORIDE 0.9 % IV SOLN
65.0000 mg/m2 | Freq: Once | INTRAVENOUS | Status: AC
  Administered 2023-09-11: 126 mg via INTRAVENOUS
  Filled 2023-09-11: qty 21

## 2023-09-11 MED ORDER — POTASSIUM CHLORIDE ER 10 MEQ PO TBCR
10.0000 meq | EXTENDED_RELEASE_TABLET | Freq: Two times a day (BID) | ORAL | 0 refills | Status: DC
  Filled 2023-09-11: qty 28, 14d supply, fill #0

## 2023-09-11 MED ORDER — ACETAMINOPHEN 325 MG PO TABS
650.0000 mg | ORAL_TABLET | Freq: Once | ORAL | Status: AC
  Administered 2023-09-11: 650 mg via ORAL
  Filled 2023-09-11: qty 2

## 2023-09-11 NOTE — Progress Notes (Unsigned)
 [facility] Cancer Follow up:    Pcp, No No address on file   DIAGNOSIS:  Cancer Staging  Malignant neoplasm of upper-outer quadrant of left breast in female, estrogen receptor negative (HCC) Staging form: Breast, AJCC 8th Edition - Clinical: Stage IA (cT1c, cN0, cM0, G2, ER-, PR-, HER2+) - Signed by Odean Potts, MD on 06/07/2023 Stage prefix: Initial diagnosis Histologic grading system: 3 grade system - Pathologic stage from 06/29/2023: Stage IA (pT1c, pN0, cM0, G2, ER-, PR-, HER2+) - Signed by Crawford Morna Pickle, NP on 09/11/2023 Stage prefix: Initial diagnosis Histologic grading system: 3 grade system    SUMMARY OF ONCOLOGIC HISTORY: Oncology History  Malignant neoplasm of upper-outer quadrant of left breast in female, estrogen receptor negative (HCC)  05/31/2023 Initial Diagnosis   Screening mammogram detected left breast mass and asymmetry retroareolar region, irregular spiculated mass 1.1 cm, axilla negative, ultrasound measured 1.2 cm, biopsy: Grade 2 IDC ER 0% PR 0% Ki67 15%, HER2 3+ positive   06/07/2023 Cancer Staging   Staging form: Breast, AJCC 8th Edition - Clinical: Stage IA (cT1c, cN0, cM0, G2, ER-, PR-, HER2+) - Signed by Odean Potts, MD on 06/07/2023 Stage prefix: Initial diagnosis Histologic grading system: 3 grade system   06/29/2023 Surgery   Left lumpectomy: 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, margins negative, 0/3 lymph nodes negative, ER 0%, PR 0%, HER2 3+ positive, Ki-67 15%    06/29/2023 Cancer Staging   Staging form: Breast, AJCC 8th Edition - Pathologic stage from 06/29/2023: Stage IA (pT1c, pN0, cM0, G2, ER-, PR-, HER2+) - Signed by Crawford Morna Pickle, NP on 09/11/2023 Stage prefix: Initial diagnosis Histologic grading system: 3 grade system   08/07/2023 -  Chemotherapy   Patient is on Treatment Plan : BREAST Paclitaxel  + Trastuzumab  q7d / Trastuzumab  q21d       CURRENT THERAPY: Taxol /Herceptin   INTERVAL  HISTORY:  Discussed the use of AI scribe software for clinical note transcription with the patient, who gave verbal consent to proceed.  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  She is undergoing adjuvant chemotherapy with Taxol  and Herceptin  for ERPR negative, HER2 positive breast cancer. She is in week six of twelve of her planned weekly therapies.  She has swelling and redness in her hand, itching in her ears, and sores on her head. Sores are also present in her nose, with bloody discharge upon blowing her nose. She denies chest pain, cough, or shortness of breath.  She finished a zpak last week for sinusitis which is otherwise improved.  Laboratory results show a low potassium level of 3.3.    Patient Active Problem List   Diagnosis Date Noted   Port-A-Cath in place 08/07/2023   Severe obstructive sleep apnea 06/19/2023   Antiplatelet or antithrombotic long-term use 06/15/2023   Positive colorectal cancer screening using Cologuard test 06/15/2023   Malignant neoplasm of upper-outer quadrant of left breast in female, estrogen receptor negative (HCC) 06/05/2023   Excessive daytime sleepiness 12/22/2022   DOE (dyspnea on exertion) 12/22/2022   Centrilobular emphysema (HCC) 12/22/2022   Claudication in peripheral vascular disease (HCC) 10/04/2018   Essential hypertension 10/04/2018   Hyperlipidemia 10/04/2018   Tobacco abuse 10/04/2018   Family history of heart disease 10/04/2018    is allergic to codeine, other, sulfonamide derivatives, and lisinopril.  MEDICAL HISTORY: Past Medical History:  Diagnosis Date   Anxiety  Arthritis    Breast cancer (HCC)    left   Chronic back pain    COPD (chronic obstructive pulmonary disease) (HCC)    Diabetes mellitus    Difficult intubation    Diverticulitis    Dyspnea    with exertion   Fibromyalgia     Hyperlipidemia    Hypertension    PAD (peripheral artery disease) (HCC)    a. PV angio 10/2018 - bilateral SFA disease s/p L intervention.   PAD (peripheral artery disease) (HCC)    PONV (postoperative nausea and vomiting)    Sleep apnea    Tobacco abuse     SURGICAL HISTORY: Past Surgical History:  Procedure Laterality Date   ABDOMINAL AORTOGRAM W/LOWER EXTREMITY Right 10/18/2018   Procedure: ABDOMINAL AORTOGRAM W/LOWER EXTREMITY;  Surgeon: Court Dorn PARAS, MD;  Location: [facility];  Service: Cardiovascular;  Laterality: Right;   ABDOMINAL AORTOGRAM W/LOWER EXTREMITY N/A 04/02/2020   Procedure: ABDOMINAL AORTOGRAM W/LOWER EXTREMITY;  Surgeon: Court Dorn PARAS, MD;  Location: [facility];  Service: Cardiovascular;  Laterality: N/A;   ABDOMINAL AORTOGRAM W/LOWER EXTREMITY N/A 04/23/2020   Procedure: ABDOMINAL AORTOGRAM W/LOWER EXTREMITY;  Surgeon: Court Dorn PARAS, MD;  Location: [facility];  Service: Cardiovascular;  Laterality: N/A;   ABDOMINAL HYSTERECTOMY     BREAST BIOPSY Left 05/31/2023   US  LT BREAST BX W LOC DEV 1ST LESION IMG BX SPEC US  GUIDE 05/31/2023 [facility]   BREAST BIOPSY  06/27/2023   MM LT RADIOACTIVE SEED LOC MAMMO GUIDE 06/27/2023 [facility]   BREAST LUMPECTOMY WITH RADIOACTIVE SEED AND SENTINEL LYMPH NODE BIOPSY Left 06/29/2023   Procedure: BREAST LUMPECTOMY WITH RADIOACTIVE SEED AND SENTINEL LYMPH NODE BIOPSY;  Surgeon: Curvin Deward MOULD, MD;  Location: [facility];  Service: General;  Laterality: Left;  LEFT BREAST RADIOACTIVE SEED LOCALIZED LUMPECTOMY   SENTINEL NODE BIOPSY   CHOLECYSTECTOMY     PERIPHERAL VASCULAR ATHERECTOMY Left 10/18/2018   Procedure: PERIPHERAL VASCULAR ATHERECTOMY;  Surgeon: Court Dorn PARAS, MD;  Location: [facility];  Service: Cardiovascular;  Laterality: Left;  SFA   PERIPHERAL VASCULAR ATHERECTOMY Right 11/22/2018   Procedure: PERIPHERAL VASCULAR ATHERECTOMY;  Surgeon: Court Dorn PARAS, MD;  Location:  [facility] INVASIVE CV LAB;  Service: Cardiovascular;  Laterality: Right;  SFA   PERIPHERAL VASCULAR ATHERECTOMY  04/23/2020   Procedure: PERIPHERAL VASCULAR ATHERECTOMY;  Surgeon: Court Dorn PARAS, MD;  Location: [facility];  Service: Cardiovascular;;   PERIPHERAL VASCULAR BALLOON ANGIOPLASTY Right 11/22/2018   Procedure: PERIPHERAL VASCULAR BALLOON ANGIOPLASTY;  Surgeon: Court Dorn PARAS, MD;  [facility]: [facility];  Service: Cardiovascular;  Laterality: Right;  SFA   PERIPHERAL VASCULAR BALLOON ANGIOPLASTY  04/23/2020   Procedure: PERIPHERAL VASCULAR BALLOON ANGIOPLASTY;  Surgeon: Court Dorn PARAS, MD;  Location: [facility];  Service: Cardiovascular;;   PERIPHERAL VASCULAR INTERVENTION Left 04/02/2020   Procedure: PERIPHERAL VASCULAR INTERVENTION;  Surgeon: Court Dorn PARAS, MD;  Location: [facility];  Service: Cardiovascular;  Laterality: Left;   PORTACATH PLACEMENT N/A 06/29/2023   Procedure: INSERTION, TUNNELED CENTRAL VENOUS DEVICE, WITH PORT;  Surgeon: Curvin Deward MOULD, MD;  Location: [facility];  Service: General;  Laterality: N/A;  PORT PLACEMENT WITH ULTRASOUND GUIDANCE GEN w/PEC BLOCK    SOCIAL HISTORY: Social History   Socioeconomic History   Marital status: Legally Separated    Spouse name: Not on file   Number of children: 2   Years of education: Not on file  Highest education level: Not on file  Occupational History   Occupation: disabled  Tobacco Use   Smoking status: Some Days    Current packs/day: 0.00    Types: Cigarettes    Last attempt to quit: 08/11/2022    Years since quitting: 1.0   Smokeless tobacco: Never   Tobacco comments:    Trying quit with patch  Vaping Use   Vaping status: Never Used  Substance and Sexual Activity   Alcohol use: No   Drug use: No   Sexual activity: Yes    Birth control/protection: Surgical  Other Topics Concern   Not on file  Social History Narrative   Not on file   Social Drivers of Health   Financial  Resource Strain: Not on file  Food Insecurity: No Food Insecurity (06/07/2023)   Hunger Vital Sign    Worried About Running Out of Food in the Last Year: Never true    Ran Out of Food in the Last Year: Never true  Transportation Needs: No Transportation Needs (06/07/2023)   PRAPARE - Administrator, Civil Service (Medical): No    Lack of Transportation (Non-Medical): No  Physical Activity: Not on file  Stress: Not on file  Social Connections: Not on file  Intimate Partner Violence: Not At Risk (06/07/2023)   Humiliation, Afraid, Rape, and Kick questionnaire    Fear of Current or Ex-Partner: No    Emotionally Abused: No    Physically Abused: No    Sexually Abused: No    FAMILY HISTORY: Family History  Problem Relation Age of Onset   Diabetes Mother    Hypertension Mother    Hyperlipidemia Mother    Heart attack Father    Heart disease Father    Hypertension Father    Hyperlipidemia Father    Breast cancer Neg Hx    Colon cancer Neg Hx    Esophageal cancer Neg Hx     Review of Systems  Constitutional:  Negative for appetite change, chills, fatigue, fever and unexpected weight change.  HENT:   Negative for hearing loss, lump/mass and trouble swallowing.   Eyes:  Negative for eye problems and icterus.  Respiratory:  Negative for chest tightness, cough and shortness of breath.   Cardiovascular:  Negative for chest pain, leg swelling and palpitations.  Gastrointestinal:  Negative for abdominal distention, abdominal pain, constipation, diarrhea, nausea and vomiting.  Endocrine: Negative for hot flashes.  Genitourinary:  Negative for difficulty urinating.   Musculoskeletal:  Negative for arthralgias.  Skin:  Positive for itching. Negative for rash.  Neurological:  Negative for dizziness, extremity weakness, headaches and numbness.  Hematological:  Negative for adenopathy. Does not bruise/bleed easily.  Psychiatric/Behavioral:  Negative for depression. The patient is  not nervous/anxious.       PHYSICAL EXAMINATION   Onc Performance Status - 09/11/23 0800       ECOG Perf Status   ECOG Perf Status Restricted in physically strenuous activity but ambulatory and able to carry out work of a light or sedentary nature, e.g., light house work, office work      KPS SCALE   KPS % SCORE Able to carry on normal activity, minor s/s of disease          There were no vitals filed for this visit.  Physical Exam Constitutional:      General: She is not in acute distress.    Appearance: Normal appearance. She is not toxic-appearing.  HENT:     Head:  Normocephalic and atraumatic.     Mouth/Throat:     Mouth: Mucous membranes are moist.     Pharynx: Oropharynx is clear. No oropharyngeal exudate or posterior oropharyngeal erythema.  Eyes:     General: No scleral icterus. Cardiovascular:     Rate and Rhythm: Normal rate and regular rhythm.     Pulses: Normal pulses.     Heart sounds: Normal heart sounds.  Pulmonary:     Effort: Pulmonary effort is normal.     Breath sounds: Normal breath sounds.  Abdominal:     General: Abdomen is flat. Bowel sounds are normal. There is no distension.     Palpations: Abdomen is soft.     Tenderness: There is no abdominal tenderness.  Musculoskeletal:        General: No swelling.     Cervical back: Neck supple.  Lymphadenopathy:     Cervical: No cervical adenopathy.  Skin:    General: Skin is warm and dry.     Findings: No rash.     Comments: Redness noted on right hand, and two areas of erythema on scalp noted, no sign of swelling, warmth, noted  Neurological:     General: No focal deficit present.     Mental Status: She is alert.  Psychiatric:        Mood and Affect: Mood normal.        Behavior: Behavior normal.     LABORATORY DATA:  CBC    Component Value Date/Time   WBC 7.3 09/11/2023 0744   WBC 9.4 07/26/2023 1025   RBC 3.13 (L) 09/11/2023 0744   HGB 9.4 (L) 09/11/2023 0744   HGB 12.0  04/15/2020 1537   HCT 28.2 (L) 09/11/2023 0744   HCT 36.5 04/15/2020 1537   PLT 336 09/11/2023 0744   PLT 323 04/15/2020 1537   MCV 90.1 09/11/2023 0744   MCV 87 04/15/2020 1537   MCH 30.0 09/11/2023 0744   MCHC 33.3 09/11/2023 0744   RDW 17.0 (H) 09/11/2023 0744   RDW 14.4 04/15/2020 1537   LYMPHSABS 1.7 09/11/2023 0744   LYMPHSABS 2.5 10/25/2018 1552   MONOABS 0.4 09/11/2023 0744   EOSABS 0.2 09/11/2023 0744   EOSABS 0.2 10/25/2018 1552   BASOSABS 0.1 09/11/2023 0744   BASOSABS 0.1 10/25/2018 1552    CMP     Component Value Date/Time   NA 140 09/11/2023 0744   NA 139 04/15/2020 1537   K 3.3 (L) 09/11/2023 0744   CL 105 09/11/2023 0744   CO2 29 09/11/2023 0744   GLUCOSE 126 (H) 09/11/2023 0744   BUN 5 (L) 09/11/2023 0744   BUN 9 04/15/2020 1537   CREATININE 0.70 09/11/2023 0744   CALCIUM  7.3 (L) 09/11/2023 0744   PROT 5.8 (L) 09/11/2023 0744   PROT 7.0 03/11/2020 1005   ALBUMIN 3.3 (L) 09/11/2023 0744   ALBUMIN 4.3 03/11/2020 1005   AST 12 (L) 09/11/2023 0744   ALT 8 09/11/2023 0744   ALKPHOS 119 09/11/2023 0744   BILITOT 0.2 09/11/2023 0744   GFRNONAA >60 09/11/2023 0744   GFRAA 73 03/11/2020 1005     ASSESSMENT and THERAPY PLAN:   Malignant neoplasm of upper-outer quadrant of left breast in female, estrogen receptor negative (HCC) 06/29/2023: Left lumpectomy: 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, margins negative, 0/3 lymph nodes negative, ER 0%, PR 0%, HER2 3+ positive, Ki-67 15%   Treatment plan: Adjuvant chemotherapy with Taxol  Herceptin  followed by Herceptin  maintenance for 1 year Adjuvant  radiation therapy --------------------------------------------------------------------------------------------------- Current treatment: Taxol  Herceptin  Echo 07/03/2023: EF 70 to 75% Assessment and Plan Assessment & Plan Left breast invasive ductal carcinoma, HER2 positive, ERPR negative, status post lumpectomy, on adjuvant chemotherapy Stage 1A, HER2  positive, ERPR negative, post-lumpectomy, on week 6 of 12 of adjuvant Taxol  and Herceptin . Echocardiogram shows LVEF 70-75%. Labs stable today -Proceed with treatment.  No signs of neuropathy.    Chemotherapy-induced pruritus and skin sores Pruritus and skin sores on arms, ears, and scalp. No infection. - Prescribed ointment for itching areas.  Peripheral edema Peripheral edema with weight gain. No chest pain, cough, or shortness of breath. - Prescribed Lasix  (furosemide ) 1 tablet daily for 3 days, not to exceed 3 days in a row.  Hypokalemia Potassium level at 3.3 mEq/L. Risk of further decrease with furosemide . - Prescribed potassium supplement. - Monitor potassium levels closely.  RTC in 1 week for labs, f/u, and her next treatment.    All questions were answered. The patient knows to call the clinic with any problems, questions or concerns. We can certainly see the patient much sooner if necessary.  Total encounter time:30 minutes*in face-to-face visit time, chart review, lab review, care coordination, order entry, and documentation of the encounter time.    Morna Kendall, NP 09/12/23 1:47 PM Medical Oncology and Hematology [facility] [address] [place], [place] [address] Tel. [phone]    Fax. [phone]  *Total Encounter Time as defined by the Centers for Medicare and Medicaid Services includes, in addition to the face-to-face time of a patient visit (documented in the note above) non-face-to-face time: obtaining and reviewing outside history, ordering and reviewing medications, tests or procedures, care coordination (communications with other health care professionals or caregivers) and documentation in the medical record.

## 2023-09-11 NOTE — Patient Instructions (Signed)
 [facility] - A DEPT OF [facility]  Discharge Instructions: Thank you for choosing [facility] to provide your oncology and hematology care.   If you have a lab appointment with the Cancer Center, please go directly to the Cancer Center and check in at the registration area.   Wear comfortable clothing and clothing appropriate for easy access to any Portacath or PICC line.   We strive to give you quality time with your provider. You may need to reschedule your appointment if you arrive late (15 or more minutes).  Arriving late affects you and other patients whose appointments are after yours.  Also, if you miss three or more appointments without notifying the office, you may be dismissed from the clinic at the provider's discretion.      For prescription refill requests, have your pharmacy contact our office and allow 72 hours for refills to be completed.    Today you received the following chemotherapy and/or immunotherapy agents: Trastuzumab  (Kanjinti ) and Paclitaxel  (Taxol ).   To help prevent nausea and vomiting after your treatment, we encourage you to take your nausea medication as directed.  BELOW ARE SYMPTOMS THAT SHOULD BE REPORTED IMMEDIATELY: *FEVER GREATER THAN 100.4 F (38 C) OR HIGHER *CHILLS OR SWEATING *NAUSEA AND VOMITING THAT IS NOT CONTROLLED WITH YOUR NAUSEA MEDICATION *UNUSUAL SHORTNESS OF BREATH *UNUSUAL BRUISING OR BLEEDING *URINARY PROBLEMS (pain or burning when urinating, or frequent urination) *BOWEL PROBLEMS (unusual diarrhea, constipation, pain near the anus) TENDERNESS IN MOUTH AND THROAT WITH OR WITHOUT PRESENCE OF ULCERS (sore throat, sores in mouth, or a toothache) UNUSUAL RASH, SWELLING OR PAIN  UNUSUAL VAGINAL DISCHARGE OR ITCHING   Items with * indicate a potential emergency and should be followed up as soon as possible or go to the Emergency Department if any problems should occur.  Please show the  CHEMOTHERAPY ALERT CARD or IMMUNOTHERAPY ALERT CARD at check-in to the Emergency Department and triage nurse.  Should you have questions after your visit or need to cancel or reschedule your appointment, please contact [facility] - A DEPT OF [facility]  Dept: [phone]  and follow the prompts.  Office hours are 8:00 a.m. to 4:30 p.m. Monday - Friday. Please note that voicemails left after 4:00 p.m. may not be returned until the following business day.  We are closed weekends and major holidays. You have access to a nurse at all times for urgent questions. Please call the main number to the clinic Dept: [phone] and follow the prompts.   For any non-urgent questions, you may also contact your provider using MyChart. We now offer e-Visits for anyone 81 and older to request care online for non-urgent symptoms. For details visit [email or web address].   Also download the MyChart app! Go to the app store, search MyChart, open the app, select Industry, and log in with your MyChart username and password.  Hypokalemia Hypokalemia means that the amount of potassium in the blood is lower than normal. Potassium is a mineral (electrolyte) that helps regulate the amount of fluid in the body. It also stimulates muscle tightening (contraction) and helps nerves work properly. Normally, most of the body's potassium is inside cells, and only a very small amount is in the blood. Because the amount in the blood is so small, minor changes to potassium levels in the blood can be life-threatening. What are the causes? This condition may be caused by: Antibiotic  medicine. Diarrhea or vomiting. Taking too much of a medicine that helps you have a bowel movement (laxative) can cause diarrhea and lead to hypokalemia. Chronic kidney disease (CKD). Medicines that help the body get rid of excess fluid (diuretics). Eating disorders, such as anorexia or bulimia. Low magnesium levels  in the body. Sweating a lot. What are the signs or symptoms? Symptoms of this condition include: Weakness. Constipation. Fatigue. Muscle cramps. Mental confusion. Skipped heartbeats or irregular heartbeat (palpitations). Tingling or numbness. How is this diagnosed? This condition is diagnosed with a blood test. How is this treated? This condition may be treated by: Taking potassium supplements. Adjusting the medicines that you take. Eating more foods that contain a lot of potassium. If your potassium level is very low, you may need to get potassium through an IV and be monitored in the hospital. Follow these instructions at home: Eating and drinking  Eat a healthy diet. A healthy diet includes fresh fruits and vegetables, whole grains, healthy fats, and lean proteins. If told, eat more foods that contain a lot of potassium. These include: Nuts, such as peanuts and pistachios. Seeds, such as sunflower seeds and pumpkin seeds. Peas, lentils, and lima beans. Whole grain and bran cereals and breads. Fresh fruits and vegetables, such as apricots, avocado, bananas, cantaloupe, kiwi, oranges, tomatoes, asparagus, and potatoes. Juices, such as orange, tomato, and prune. Lean meats, including fish. Milk and milk products, such as yogurt. General instructions Take over-the-counter and prescription medicines only as told by your health care provider. This includes vitamins, natural food products, and supplements. Keep all follow-up visits. This is important. Contact a health care provider if: You have weakness that gets worse. You feel your heart pounding or racing. You vomit. You have diarrhea. You have diabetes and you have trouble keeping your blood sugar in your target range. Get help right away if: You have chest pain. You have shortness of breath. You have vomiting or diarrhea that lasts for more than 2 days. You faint. These symptoms may be an emergency. Get help right away.  Call 911. Do not wait to see if the symptoms will go away. Do not drive yourself to the hospital. Summary Hypokalemia means that the amount of potassium in the blood is lower than normal. This condition is diagnosed with a blood test. Hypokalemia may be treated by taking potassium supplements, adjusting the medicines that you take, or eating more foods that are high in potassium. If your potassium level is very low, you may need to get potassium through an IV and be monitored in the hospital. This information is not intended to replace advice given to you by your health care provider. Make sure you discuss any questions you have with your health care provider. Document Revised: 09/17/2020 Document Reviewed: 09/17/2020 Elsevier Patient Education  2024 [employer].

## 2023-09-12 ENCOUNTER — Encounter: Payer: Self-pay | Admitting: Hematology and Oncology

## 2023-09-12 ENCOUNTER — Encounter: Payer: Self-pay | Admitting: *Deleted

## 2023-09-12 NOTE — Assessment & Plan Note (Signed)
 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

## 2023-09-17 ENCOUNTER — Emergency Department (HOSPITAL_COMMUNITY)

## 2023-09-17 ENCOUNTER — Emergency Department (HOSPITAL_COMMUNITY)
Admission: EM | Admit: 2023-09-17 | Discharge: 2023-09-17 | Disposition: A | Discharge status: Home / Self Care | Attending: Emergency Medicine | Admitting: Emergency Medicine

## 2023-09-17 ENCOUNTER — Other Ambulatory Visit: Payer: Self-pay

## 2023-09-17 DIAGNOSIS — E119 Type 2 diabetes mellitus without complications: Secondary | ICD-10-CM | POA: Insufficient documentation

## 2023-09-17 DIAGNOSIS — Z7982 Long term (current) use of aspirin: Secondary | ICD-10-CM | POA: Insufficient documentation

## 2023-09-17 DIAGNOSIS — Z7902 Long term (current) use of antithrombotics/antiplatelets: Secondary | ICD-10-CM | POA: Diagnosis not present

## 2023-09-17 DIAGNOSIS — E876 Hypokalemia: Secondary | ICD-10-CM | POA: Insufficient documentation

## 2023-09-17 DIAGNOSIS — K5732 Diverticulitis of large intestine without perforation or abscess without bleeding: Secondary | ICD-10-CM | POA: Insufficient documentation

## 2023-09-17 DIAGNOSIS — J449 Chronic obstructive pulmonary disease, unspecified: Secondary | ICD-10-CM | POA: Insufficient documentation

## 2023-09-17 DIAGNOSIS — I10 Essential (primary) hypertension: Secondary | ICD-10-CM | POA: Insufficient documentation

## 2023-09-17 DIAGNOSIS — Z794 Long term (current) use of insulin: Secondary | ICD-10-CM | POA: Diagnosis not present

## 2023-09-17 DIAGNOSIS — Z79899 Other long term (current) drug therapy: Secondary | ICD-10-CM | POA: Diagnosis not present

## 2023-09-17 DIAGNOSIS — Z7984 Long term (current) use of oral hypoglycemic drugs: Secondary | ICD-10-CM | POA: Insufficient documentation

## 2023-09-17 DIAGNOSIS — R748 Abnormal levels of other serum enzymes: Secondary | ICD-10-CM | POA: Diagnosis not present

## 2023-09-17 DIAGNOSIS — Z7951 Long term (current) use of inhaled steroids: Secondary | ICD-10-CM | POA: Diagnosis not present

## 2023-09-17 DIAGNOSIS — R1032 Left lower quadrant pain: Secondary | ICD-10-CM | POA: Diagnosis present

## 2023-09-17 DIAGNOSIS — Z853 Personal history of malignant neoplasm of breast: Secondary | ICD-10-CM | POA: Diagnosis not present

## 2023-09-17 DIAGNOSIS — K5792 Diverticulitis of intestine, part unspecified, without perforation or abscess without bleeding: Secondary | ICD-10-CM

## 2023-09-17 DIAGNOSIS — D649 Anemia, unspecified: Secondary | ICD-10-CM | POA: Insufficient documentation

## 2023-09-17 LAB — COMPREHENSIVE METABOLIC PANEL WITH GFR
ALT: 7 U/L (ref 0–44)
AST: 15 U/L (ref 15–41)
Albumin: 3.5 g/dL (ref 3.5–5.0)
Alkaline Phosphatase: 130 U/L — ABNORMAL HIGH (ref 38–126)
Anion gap: 12 (ref 5–15)
BUN: 7 mg/dL (ref 6–20)
CO2: 26 mmol/L (ref 22–32)
Calcium: 7.7 mg/dL — ABNORMAL LOW (ref 8.9–10.3)
Chloride: 103 mmol/L (ref 98–111)
Creatinine, Ser: 0.76 mg/dL (ref 0.44–1.00)
GFR, Estimated: >60 mL/min (ref >60)
Glucose, Bld: 124 mg/dL — ABNORMAL HIGH (ref 70–99)
Potassium: 3.4 mmol/L — ABNORMAL LOW (ref 3.5–5.1)
Sodium: 141 mmol/L (ref 135–145)
Total Bilirubin: 0.3 mg/dL (ref 0.0–1.2)
Total Protein: 6.4 g/dL — ABNORMAL LOW (ref 6.5–8.1)

## 2023-09-17 LAB — CBC WITH DIFFERENTIAL/PLATELET
Abs Immature Granulocytes: 0.08 K/uL — ABNORMAL HIGH (ref 0.00–0.07)
Basophils Absolute: 0 K/uL (ref 0.0–0.1)
Basophils Relative: 1 %
Eosinophils Absolute: 0.2 K/uL (ref 0.0–0.5)
Eosinophils Relative: 3 %
HCT: 31.5 % — ABNORMAL LOW (ref 36.0–46.0)
Hemoglobin: 10 g/dL — ABNORMAL LOW (ref 12.0–15.0)
Immature Granulocytes: 1 %
Lymphocytes Relative: 21 %
Lymphs Abs: 1.2 K/uL (ref 0.7–4.0)
MCH: 29.4 pg (ref 26.0–34.0)
MCHC: 31.7 g/dL (ref 30.0–36.0)
MCV: 92.6 fL (ref 80.0–100.0)
Monocytes Absolute: 0.4 K/uL (ref 0.1–1.0)
Monocytes Relative: 6 %
Neutro Abs: 3.9 K/uL (ref 1.7–7.7)
Neutrophils Relative %: 68 %
Platelets: 356 K/uL (ref 150–400)
RBC: 3.4 MIL/uL — ABNORMAL LOW (ref 3.87–5.11)
RDW: 17.5 % — ABNORMAL HIGH (ref 11.5–15.5)
WBC: 5.8 K/uL (ref 4.0–10.5)
nRBC: 0 % (ref 0.0–0.2)

## 2023-09-17 LAB — URINALYSIS, ROUTINE W REFLEX MICROSCOPIC
Bacteria, UA: NONE SEEN
Bilirubin Urine: NEGATIVE
Glucose, UA: NEGATIVE mg/dL
Hgb urine dipstick: NEGATIVE
Ketones, ur: NEGATIVE mg/dL
Leukocytes,Ua: NEGATIVE
Nitrite: NEGATIVE
Protein, ur: 30 mg/dL — AB
Specific Gravity, Urine: 1.003 — ABNORMAL LOW (ref 1.005–1.030)
pH: 6 (ref 5.0–8.0)

## 2023-09-17 LAB — LIPASE, BLOOD: Lipase: 17 U/L (ref 11–51)

## 2023-09-17 MED ORDER — ONDANSETRON HCL 4 MG/2ML IJ SOLN
4.0000 mg | Freq: Once | INTRAMUSCULAR | Status: DC
  Filled 2023-09-17: qty 2

## 2023-09-17 MED ORDER — IOHEXOL 300 MG/ML  SOLN
100.0000 mL | Freq: Once | INTRAMUSCULAR | Status: AC | PRN
  Administered 2023-09-17: 100 mL via INTRAVENOUS

## 2023-09-17 MED ORDER — CALCIUM GLUCONATE-NACL 1-0.675 GM/50ML-% IV SOLN
1.0000 g | Freq: Once | INTRAVENOUS | Status: AC
  Administered 2023-09-17: 1000 mg via INTRAVENOUS
  Filled 2023-09-17: qty 50

## 2023-09-17 MED ORDER — PIPERACILLIN-TAZOBACTAM 3.375 G IVPB 30 MIN
3.3750 g | Freq: Once | INTRAVENOUS | Status: AC
Start: 2023-09-17 — End: 2023-09-17
  Administered 2023-09-17: 3.375 g via INTRAVENOUS
  Filled 2023-09-17: qty 50

## 2023-09-17 MED ORDER — ONDANSETRON 4 MG PO TBDP: 4.0000 mg | ORAL_TABLET | Freq: Three times a day (TID) | ORAL | 0 refills | Status: DC | PRN

## 2023-09-17 MED ORDER — AMOXICILLIN-POT CLAVULANATE 875-125 MG PO TABS
1.0000 | ORAL_TABLET | Freq: Two times a day (BID) | ORAL | 0 refills | Status: AC
Start: 2023-09-17 — End: 2023-09-24

## 2023-09-17 MED ORDER — POTASSIUM CHLORIDE CRYS ER 20 MEQ PO TBCR
40.0000 meq | EXTENDED_RELEASE_TABLET | Freq: Two times a day (BID) | ORAL | Status: DC
  Administered 2023-09-17: 40 meq via ORAL
  Filled 2023-09-17: qty 2

## 2023-09-17 MED ORDER — ACETAMINOPHEN 325 MG PO TABS
650.0000 mg | ORAL_TABLET | Freq: Once | ORAL | Status: AC
  Administered 2023-09-17: 650 mg via ORAL
  Filled 2023-09-17: qty 2

## 2023-09-17 NOTE — H&P (Incomplete)
 History and Physical    Mary Campbell FMW:[id number] DOB: 06/26/1965 DOA: 09/17/2023  PCP: Pcp, No   Patient coming from: Home   Chief Complaint:  Chief Complaint  Patient presents with   Emesis   ED TRIAGE note:  Patient to ED by POV with c/o emesis, left side pain and swelling to R foot. She has HX of cancer and diabetes. Was given lasix  for edema but denies urinating or decrease in swelling.       HPI:  Mary Campbell is a 58 y.o. 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  Patient also endorsing chronic diarrhea due to chemotherapy however the diarrhea has been worse recently.  Patient also complaining about straining to urinate.  Denies any fever, chill, dysuria, hematuria, malodor   ED Course:  CBC no evidence of leukocytosis stable H&H normal platelet count. CMP showed low potassium 3.4 low calcium  7.7 elevated ALP which is chronic in nature.  Otherwise unremarkable.  Normal lipase level.  UA increased specific gravity otherwise unremarkable.  Blood cultures are pending. Chest x-ray no active disease process.  CT abdomen pelvis showing sigmoid diverticulitis. Diffuse thickening of the walls of the rectosigmoid colon suggesting superimposed colitis. 3. Hazy ground-glass attenuation at the lung bases, possible edema versus air trapping. 4. Stable right adrenal nodule, previously characterized as adenoma. 5. Aortic atherosclerosis  In the ED patient received Zofran  and Zosyn .  Hospitalist has been consulted for further evaluation management of acute  diverticulitis.  Significant labs in the ED: Lab Orders         Blood culture (routine x 2)         Comprehensive metabolic panel         Lipase, blood         CBC with Diff         Urinalysis, Routine w reflex microscopic -Urine, Clean Catch       Review of Systems:  ROS  Past Medical History:  Diagnosis Date   Anxiety    Arthritis    Breast cancer (HCC)    left   Chronic back pain    COPD (chronic obstructive pulmonary disease) (HCC)    Diabetes mellitus    Difficult intubation    Diverticulitis    Dyspnea    with exertion   Fibromyalgia    Hyperlipidemia    Hypertension    PAD (peripheral artery disease) (HCC)    a. PV angio 10/2018 - bilateral SFA disease s/p L intervention.   PAD (peripheral artery disease) (HCC)    PONV (postoperative nausea and vomiting)    Sleep apnea    Tobacco abuse     Past Surgical History:  Procedure Laterality Date   ABDOMINAL AORTOGRAM W/LOWER EXTREMITY Right 10/18/2018   Procedure: ABDOMINAL AORTOGRAM W/LOWER EXTREMITY;  Surgeon: Court Dorn PARAS, MD;  Location: [facility];  Service: Cardiovascular;  Laterality: Right;   ABDOMINAL AORTOGRAM W/LOWER EXTREMITY N/A 04/02/2020   Procedure: ABDOMINAL AORTOGRAM W/LOWER EXTREMITY;  Surgeon: Court Dorn PARAS, MD;  Location: [facility];  Service: Cardiovascular;  Laterality: N/A;   ABDOMINAL AORTOGRAM W/LOWER EXTREMITY N/A 04/23/2020   Procedure: ABDOMINAL AORTOGRAM W/LOWER EXTREMITY;  Surgeon: Court Dorn PARAS, MD;  Location: [facility] INVASIVE CV  LAB;  Service: Cardiovascular;  Laterality: N/A;   ABDOMINAL HYSTERECTOMY     BREAST BIOPSY Left 05/31/2023   US  LT BREAST BX W LOC DEV 1ST LESION IMG BX SPEC US  GUIDE 05/31/2023 [facility]   BREAST BIOPSY  06/27/2023   MM LT RADIOACTIVE SEED LOC MAMMO GUIDE 06/27/2023 [facility]   BREAST LUMPECTOMY WITH RADIOACTIVE SEED AND SENTINEL LYMPH NODE BIOPSY Left 06/29/2023   Procedure: BREAST LUMPECTOMY WITH RADIOACTIVE SEED AND  SENTINEL LYMPH NODE BIOPSY;  Surgeon: Curvin Deward MOULD, MD;  Location: [facility] OR;  Service: General;  Laterality: Left;  LEFT BREAST RADIOACTIVE SEED LOCALIZED LUMPECTOMY   SENTINEL NODE BIOPSY   CHOLECYSTECTOMY     PERIPHERAL VASCULAR ATHERECTOMY Left 10/18/2018   Procedure: PERIPHERAL VASCULAR ATHERECTOMY;  Surgeon: Court Dorn PARAS, MD;  Location: [facility];  Service: Cardiovascular;  Laterality: Left;  SFA   PERIPHERAL VASCULAR ATHERECTOMY Right 11/22/2018   Procedure: PERIPHERAL VASCULAR ATHERECTOMY;  Surgeon: Court Dorn PARAS, MD;  Location: [facility] INVASIVE CV LAB;  Service: Cardiovascular;  Laterality: Right;  SFA   PERIPHERAL VASCULAR ATHERECTOMY  04/23/2020   Procedure: PERIPHERAL VASCULAR ATHERECTOMY;  Surgeon: Court Dorn PARAS, MD;  Location: [facility] INVASIVE CV LAB;  Service: Cardiovascular;;   PERIPHERAL VASCULAR BALLOON ANGIOPLASTY Right 11/22/2018   Procedure: PERIPHERAL VASCULAR BALLOON ANGIOPLASTY;  Surgeon: Court Dorn PARAS, MD;  Location: [facility];  Service: Cardiovascular;  Laterality: Right;  SFA   PERIPHERAL VASCULAR BALLOON ANGIOPLASTY  04/23/2020   Procedure: PERIPHERAL VASCULAR BALLOON ANGIOPLASTY;  Surgeon: Court Dorn PARAS, MD;  Location: [facility];  Service: Cardiovascular;;   PERIPHERAL VASCULAR INTERVENTION Left 04/02/2020   Procedure: PERIPHERAL VASCULAR INTERVENTION;  Surgeon: Court Dorn PARAS, MD;  Location: [facility];  Service: Cardiovascular;  Laterality: Left;   PORTACATH PLACEMENT N/A 06/29/2023   Procedure: INSERTION, TUNNELED CENTRAL VENOUS DEVICE, WITH PORT;  Surgeon: Curvin Deward MOULD, MD;  Location: [facility] OR;  Service: General;  Laterality: N/A;  PORT PLACEMENT WITH ULTRASOUND GUIDANCE GEN w/PEC BLOCK     reports that she has been smoking cigarettes. She has never used smokeless tobacco. She reports that she does not drink alcohol and does not use drugs.  Allergies  Allergen Reactions   Codeine Hives   Other Other (See Comments)     Patient reports a history of difficult intubation   Sulfonamide Derivatives Hives   Lisinopril Cough    Family History  Problem Relation Age of Onset   Diabetes Mother    Hypertension Mother    Hyperlipidemia Mother    Heart attack Father    Heart disease Father    Hypertension Father    Hyperlipidemia Father    Breast cancer Neg Hx    Colon cancer Neg Hx    Esophageal cancer Neg Hx     Prior to Admission medications   Medication Sig Start Date End Date Taking? Authorizing Provider  acetaminophen  (TYLENOL ) 650 MG CR tablet Take 1,300 mg by mouth in the morning, at noon, and at bedtime.    [provider]  albuterol  (VENTOLIN  HFA) 108 (90 Base) MCG/ACT inhaler Inhale 2 puffs into the lungs every 6 (six) hours as needed for wheezing or shortness of breath. 12/22/22   Cobb, Comer GAILS, NP  amLODipine-benazepril (LOTREL) 5-20 MG capsule Take 1 capsule by mouth in the morning.    [provider]  aspirin  EC 81 MG tablet Take 81 mg by mouth in the morning.    [provider]  azithromycin  (ZITHROMAX ) 250 MG tablet Take 2 tablets on day 1, then 1 tablet daily until complete. 09/05/23   Crawford Morna Pickle, NP  buPROPion (WELLBUTRIN XL) 150 MG 24 hr tablet Take 150 mg by mouth in the morning. 05/23/23   [provider]  clobetasol cream (TEMOVATE) 0.05 % Apply 1 Application topically 2 (two) times daily as needed (skin irritation).    [provider]  clopidogrel  (PLAVIX ) 75 MG tablet Take 1 tablet (75 mg total) by mouth daily. 07/07/22   Court Dorn PARAS, MD  cyanocobalamin  (VITAMIN B12) 500 MCG tablet Take 2 tablets (1,000 mcg total) by mouth daily. 07/27/23   Mansouraty, Gabriel Jr., MD  DULoxetine (CYMBALTA) 30 MG capsule Take 30 mg by mouth in the morning. Total dose 90 mg (30 mg and 60 mg)    [provider]  DULoxetine (CYMBALTA) 60 MG capsule Take 60 mg by mouth in the morning. Total dose 90 mg (30 mg and 60 mg)    [provider]  ferrous gluconate  (FERGON) 324 MG tablet Take 1 tablet (324 mg total) by mouth daily with breakfast. 07/27/23   Mansouraty, Aloha Raddle., MD  fluticasone  (FLONASE ) 50 MCG/ACT nasal spray Place 1-2 sprays into both nostrils daily. 06/19/23   Cobb, Comer GAILS, NP  furosemide  (LASIX ) 20 MG tablet Take 1 tab daily for 3 days, then take daily as needed after that (do not take more than 3 days in a row). 09/11/23   Crawford Morna Pickle, NP  gabapentin  (NEURONTIN ) 100 MG capsule Take 300 mg by mouth 2 (two) times daily.    [provider]  JANUMET XR 50-1000 MG TB24 Take 1 tablet by mouth in the morning and at bedtime. 02/23/23   [provider]  LANTUS  SOLOSTAR 100 UNIT/ML Solostar Pen Inject 50 Units into the skin at bedtime. 05/01/23   [provider]  lidocaine -prilocaine  (EMLA ) cream Apply to affected area once 07/12/23   Gudena, Vinay, MD  nicotine (NICODERM CQ - DOSED IN MG/24 HOURS) 21 mg/24hr patch Place 1 patch onto the skin daily. 05/23/23   [provider]  ondansetron  (ZOFRAN ) 8 MG tablet TAKE 1 TABLET EVERY 8 HOURS AS NEEDED FOR NAUSEA OR VOMITING 09/04/23   Odean Potts, MD  potassium chloride  (KLOR-CON ) 10 MEQ tablet Take 1 tablet (10 mEq total) by mouth 2 (two) times daily. 09/11/23   Crawford Morna Pickle, NP  prochlorperazine  (COMPAZINE ) 10 MG tablet TAKE 1 TABLET EVERY 6 HOURS AS NEEDED FOR NAUSEA OR VOMITING 09/04/23   Odean Potts, MD  rosuvastatin  (CRESTOR ) 40 MG tablet Take 1 tablet (40 mg total) by mouth daily. 01/12/23   O'NealDarryle Ned, MD  Tiotropium Bromide-Olodaterol (STIOLTO RESPIMAT ) 2.5-2.5 MCG/ACT AERS Inhale 2 puffs into the lungs daily. 06/19/23   Cobb, Comer GAILS, NP  triamcinolone  (KENALOG ) 0.025 % cream Apply 1 Application topically 2 (two) times daily. 09/11/23   Crawford Morna Pickle, NP     Physical Exam: Vitals:   09/17/23 1602 09/17/23 1606  BP:  (!) 155/67  Pulse:  88  Temp:  98.1 F (36.7 C)   TempSrc:  Oral  SpO2:  100%  Weight: 80.3 kg   Height: 5' 4 (1.626 m)     Physical Exam   Labs on Admission: I have personally reviewed following labs and imaging studies  CBC: Recent Labs  Lab 09/11/23 0744 09/17/23 1755  WBC 7.3 5.8  NEUTROABS 4.9 3.9  HGB 9.4* 10.0*  HCT 28.2* 31.5*  MCV 90.1  92.6  PLT 336 356   Basic Metabolic Panel: Recent Labs  Lab 09/11/23 0744 09/17/23 1755  NA 140 141  K 3.3* 3.4*  CL 105 103  CO2 29 26  GLUCOSE 126* 124*  BUN 5* 7  CREATININE 0.70 0.76  CALCIUM  7.3* 7.7*   GFR: Estimated Creatinine Clearance: 76.6 mL/min (by C-G formula based on SCr of 0.76 mg/dL). Liver Function Tests: Recent Labs  Lab 09/11/23 0744 09/17/23 1755  AST 12* 15  ALT 8 7  ALKPHOS 119 130*  BILITOT 0.2 0.3  PROT 5.8* 6.4*  ALBUMIN 3.3* 3.5   Recent Labs  Lab 09/17/23 1755  LIPASE 17   No results for input(s): AMMONIA in the last 168 hours. Coagulation Profile: No results for input(s): INR, PROTIME in the last 168 hours. Cardiac Enzymes: No results for input(s): CKTOTAL, CKMB, CKMBINDEX, TROPONINI, TROPONINIHS in the last 168 hours. BNP (last 3 results) No results for input(s): BNP in the last 8760 hours. HbA1C: No results for input(s): HGBA1C in the last 72 hours. CBG: No results for input(s): GLUCAP in the last 168 hours. Lipid Profile: No results for input(s): CHOL, HDL, LDLCALC, TRIG, CHOLHDL, LDLDIRECT in the last 72 hours. Thyroid Function Tests: No results for input(s): TSH, T4TOTAL, FREET4, T3FREE, THYROIDAB in the last 72 hours. Anemia Panel: No results for input(s): VITAMINB12, FOLATE, FERRITIN, TIBC, IRON, RETICCTPCT in the last 72 hours. Urine analysis:    Component Value Date/Time   COLORURINE STRAW (A) 09/17/2023 1759   APPEARANCEUR CLEAR 09/17/2023 1759   LABSPEC 1.003 (L) 09/17/2023 1759   PHURINE 6.0 09/17/2023 1759   GLUCOSEU NEGATIVE 09/17/2023 1759    HGBUR NEGATIVE 09/17/2023 1759   BILIRUBINUR NEGATIVE 09/17/2023 1759   KETONESUR NEGATIVE 09/17/2023 1759   PROTEINUR 30 (A) 09/17/2023 1759   UROBILINOGEN 0.2 10/13/2012 1937   NITRITE NEGATIVE 09/17/2023 1759   LEUKOCYTESUR NEGATIVE 09/17/2023 1759    Radiological Exams on Admission: I have personally reviewed images DG Chest 2 View Result Date: 09/17/2023 CLINICAL DATA:  cough EXAM: CHEST - 2 VIEW COMPARISON:  07/07/2023 FINDINGS: Cardiac silhouette is unremarkable. No pneumothorax or pleural effusion. The lungs are clear. The visualized skeletal structures are unremarkable. IMPRESSION: No acute cardiopulmonary process. Electronically Signed   By: Fonda Field M.D.   On: 09/17/2023 19:03   CT ABDOMEN PELVIS W CONTRAST Result Date: 09/17/2023 CLINICAL DATA:  Abdominal pain, acute, nonlocalized.  Emesis. EXAM: CT ABDOMEN AND PELVIS WITH CONTRAST TECHNIQUE: Multidetector CT imaging of the abdomen and pelvis was performed using the standard protocol following bolus administration of intravenous contrast. RADIATION DOSE REDUCTION: This exam was performed according to the departmental dose-optimization program which includes automated exposure control, adjustment of the mA and/or kV according to patient size and/or use of iterative reconstruction technique. CONTRAST:  100mL OMNIPAQUE  IOHEXOL  300 MG/ML  SOLN COMPARISON:  11/23/2020. FINDINGS: Lower chest: The heart is enlarged and there is a trace pericardial effusion. Hazy ground-glass attenuation is noted at the lung bases. Hepatobiliary: No focal liver abnormality is seen. Status post cholecystectomy. No biliary dilatation. Pancreas: Unremarkable. No pancreatic ductal dilatation or surrounding inflammatory changes. Spleen: Normal in size without focal abnormality. Adrenals/Urinary Tract: There is a stable 2 cm nodule in the right adrenal gland, previously characterized as adenoma. The left adrenal gland is within normal limits. The kidneys enhance  symmetrically. No renal calculus or hydronephrosis bilaterally. The bladder is unremarkable. Stomach/Bowel: The stomach is within normal limits. No bowel obstruction, free air, or pneumatosis is seen. There is  diffuse thickening of the:. Appendix appears normal. Scattered diverticula are noted along the colon with Peri colonic fat stranding in the sigmoid colon in the left lower quadrant. No abscess is seen. Vascular/Lymphatic: Aortic atherosclerosis. There is a prominent lymph node in the left lower quadrant along the iliac chain, likely reactive. Reproductive: Status post hysterectomy. No adnexal masses. Other: No abdominopelvic ascites. Multifocal fat containing umbilical hernias are noted in the anterior abdominal wall on the left superior to the umbilicus. Musculoskeletal: Degenerative changes are present in the thoracolumbar spine. No acute osseous abnormality. IMPRESSION: 1. Findings compatible with sigmoid diverticulitis. 2. Diffuse thickening of the walls of the rectosigmoid colon suggesting superimposed colitis. 3. Hazy ground-glass attenuation at the lung bases, possible edema versus air trapping. 4. Stable right adrenal nodule, previously characterized as adenoma. 5. Aortic atherosclerosis. Electronically Signed   By: Leita Birmingham M.D.   On: 09/17/2023 19:01     EKG: My personal interpretation of EKG shows: ***    Assessment/Plan: Active Problems:   * No active hospital problems. *    Assessment and Plan: No notes have been filed under this hospital service. Service: Hospitalist      DVT prophylaxis:  {Blank single:19197::Lovenox,SQ Heparin ,IV heparin  gtts,Xarelto,Eliquis,Coumadin,SCDs,***} Code Status:  {Blank single:19197::Full Code,DNR with Intubation,DNR/DNI(Do NOT Intubate),Comfort Care,***} Diet:  Family Communication:  *** Family was present at bedside, at the time of interview.  Opportunity was given to ask question and all questions were  answered satisfactorily.  Disposition Plan:  ***  Consults:  ***  Admission status:   {Blank single:19197::Observation,Inpatient}, {Blank single:19197::Med-Surg,Telemetry bed,Step Down Unit}  Severity of Illness: {Observation/Inpatient:21159}    Micaela Speaker, MD [facility]  How to contact the [facility] Attending or Consulting provider 7A - 7P or covering provider during after hours 7P -7A, for this patient.  Check the care team in [facility] and look for a) attending/consulting [facility] provider listed and b) the [facility] team listed Log into [email or web address] and use [facility] universal password to access. If you do not have the password, please contact the hospital operator. Locate the [facility] provider you are looking for under [facility] and page to a number that you can be directly reached. If you still have difficulty reaching the provider, please page the [facility] (Director on Call) for the Hospitalists listed on amion for assistance.  09/17/2023, 8:01 PM

## 2023-09-17 NOTE — ED Provider Notes (Cosign Needed Addendum)
 [place] EMERGENCY DEPARTMENT AT [facility] Provider Note   CSN: [id number] Arrival date & time: 09/17/23  1551     Patient presents with: Emesis   Mary Campbell is a 58 y.o. female with PMHx OA, COPD, DM, fibromyalgia, HLD, HTN breast cancer on chemotherapy who presents to ED concerned for left sided abdominal pain, nausea, and vomiting since yesterday. Patient also endorsing chronic diarrhea d/t chemotherapy - but notes that diarrhea has been worse recently. Patient also stating that she is straining to urinate. She was able to urinate last around 12PM today. She denies dysuria, hematuria, or malodorous urine. Patient also endorsing mild cough and rhinorrhea x1 week that she was placed on azithromycin  for.   Patient denies fever, chest pain, SOB, hematemesis. Patient endorses hx of hemorrhoids and has blood on wipe but denies bloody stool or melena.   Last dose of chemo was Monday.  Of note, patient also concerned for swelling and erythema of right foot progressing over the past 2 weeks. Patient was given lasix  for this which did not help symptoms.      Emesis      Prior to Admission medications   Medication Sig Start Date End Date Taking? Authorizing Provider  amoxicillin -clavulanate (AUGMENTIN ) 875-125 MG tablet Take 1 tablet by mouth every 12 (twelve) hours for 7 days. 09/17/23 09/24/23 Yes Corby Villasenor, Nidia F, PA-C  ondansetron  (ZOFRAN -ODT) 4 MG disintegrating tablet Take 1 tablet (4 mg total) by mouth every 8 (eight) hours as needed for nausea. 09/17/23  Yes Hoy Nidia F, PA-C  acetaminophen  (TYLENOL ) 650 MG CR tablet Take 1,300 mg by mouth in the morning, at noon, and at bedtime.    [provider]  albuterol  (VENTOLIN  HFA) 108 (90 Base) MCG/ACT inhaler Inhale 2 puffs into the lungs every 6 (six) hours as needed for wheezing or shortness of breath. 12/22/22   Cobb, Comer GAILS, NP  amLODipine-benazepril (LOTREL) 5-20 MG capsule Take 1 capsule by  mouth in the morning.    [provider]  aspirin  EC 81 MG tablet Take 81 mg by mouth in the morning.    [provider]  azithromycin  (ZITHROMAX ) 250 MG tablet Take 2 tablets on day 1, then 1 tablet daily until complete. 09/05/23   Crawford Morna Pickle, NP  buPROPion (WELLBUTRIN XL) 150 MG 24 hr tablet Take 150 mg by mouth in the morning. 05/23/23   [provider]  clobetasol cream (TEMOVATE) 0.05 % Apply 1 Application topically 2 (two) times daily as needed (skin irritation).    [provider]  clopidogrel  (PLAVIX ) 75 MG tablet Take 1 tablet (75 mg total) by mouth daily. 07/07/22   Court Dorn PARAS, MD  cyanocobalamin  (VITAMIN B12) 500 MCG tablet Take 2 tablets (1,000 mcg total) by mouth daily. 07/27/23   Mansouraty, Gabriel Jr., MD  DULoxetine (CYMBALTA) 30 MG capsule Take 30 mg by mouth in the morning. Total dose 90 mg (30 mg and 60 mg)    [provider]  DULoxetine (CYMBALTA) 60 MG capsule Take 60 mg by mouth in the morning. Total dose 90 mg (30 mg and 60 mg)    [provider]  ferrous gluconate  (FERGON) 324 MG tablet Take 1 tablet (324 mg total) by mouth daily with breakfast. 07/27/23   Mansouraty, Aloha Raddle., MD  fluticasone  (FLONASE ) 50 MCG/ACT nasal spray Place 1-2 sprays into both nostrils daily. 06/19/23   Cobb, Comer GAILS, NP  furosemide  (LASIX ) 20 MG tablet Take 1 tab daily for 3  days, then take daily as needed after that (do not take more than 3 days in a row). 09/11/23   Crawford Morna Pickle, NP  gabapentin  (NEURONTIN ) 100 MG capsule Take 300 mg by mouth 2 (two) times daily.    [provider]  JANUMET XR 50-1000 MG TB24 Take 1 tablet by mouth in the morning and at bedtime. 02/23/23   [provider]  LANTUS  SOLOSTAR 100 UNIT/ML Solostar Pen Inject 50 Units into the skin at bedtime. 05/01/23   [provider]  lidocaine -prilocaine  (EMLA ) cream Apply to affected area once 07/12/23   Gudena, Vinay, MD   nicotine (NICODERM CQ - DOSED IN MG/24 HOURS) 21 mg/24hr patch Place 1 patch onto the skin daily. 05/23/23   [provider]  ondansetron  (ZOFRAN ) 8 MG tablet TAKE 1 TABLET EVERY 8 HOURS AS NEEDED FOR NAUSEA OR VOMITING 09/04/23   Odean Potts, MD  potassium chloride  (KLOR-CON ) 10 MEQ tablet Take 1 tablet (10 mEq total) by mouth 2 (two) times daily. 09/11/23   Crawford Morna Pickle, NP  prochlorperazine  (COMPAZINE ) 10 MG tablet TAKE 1 TABLET EVERY 6 HOURS AS NEEDED FOR NAUSEA OR VOMITING 09/04/23   Odean Potts, MD  rosuvastatin  (CRESTOR ) 40 MG tablet Take 1 tablet (40 mg total) by mouth daily. 01/12/23   O'NealDarryle Ned, MD  Tiotropium Bromide-Olodaterol (STIOLTO RESPIMAT ) 2.5-2.5 MCG/ACT AERS Inhale 2 puffs into the lungs daily. 06/19/23   Cobb, Comer GAILS, NP  triamcinolone  (KENALOG ) 0.025 % cream Apply 1 Application topically 2 (two) times daily. 09/11/23   Crawford Morna Pickle, NP    Allergies: Codeine, Other, Sulfonamide derivatives, and Lisinopril    Review of Systems  Gastrointestinal:  Positive for vomiting.    Updated Vital Signs BP (!) 158/76   Pulse 94   Temp 98.5 F (36.9 C) (Oral)   Resp 18   Ht 5' 4 (1.626 m)   Wt 80.3 kg   SpO2 97%   BMI 30.38 kg/m   Physical Exam Vitals and nursing note reviewed.  Constitutional:      General: She is not in acute distress.    Appearance: She is not ill-appearing or toxic-appearing.  HENT:     Head: Normocephalic and atraumatic.     Mouth/Throat:     Mouth: Mucous membranes are moist.  Eyes:     General: No scleral icterus.       Right eye: No discharge.        Left eye: No discharge.     Conjunctiva/sclera: Conjunctivae normal.  Cardiovascular:     Rate and Rhythm: Normal rate and regular rhythm.     Pulses: Normal pulses.     Heart sounds: Normal heart sounds. No murmur heard. Pulmonary:     Effort: Pulmonary effort is normal. No respiratory distress.     Breath sounds: Normal breath sounds. No  wheezing, rhonchi or rales.  Abdominal:     General: Abdomen is flat. Bowel sounds are normal. There is no distension.     Palpations: Abdomen is soft. There is no mass.     Tenderness: There is abdominal tenderness.     Comments: LLQ tenderness to palpation  Musculoskeletal:     Right lower leg: No edema.     Left lower leg: No edema.  Skin:    General: Skin is warm and dry.     Findings: No rash.     Comments: Erythema and mild swelling of right lower leg. +2 pedal pulses. Sensation to light touch  intact. Area nontense.   Neurological:     General: No focal deficit present.     Mental Status: She is alert and oriented to person, place, and time. Mental status is at baseline.  Psychiatric:        Mood and Affect: Mood normal.        Behavior: Behavior normal.     (all labs ordered are listed, but only abnormal results are displayed) Labs Reviewed  COMPREHENSIVE METABOLIC PANEL WITH GFR - Abnormal; Notable for the following components:      Result Value   Potassium 3.4 (*)    Glucose, Bld 124 (*)    Calcium  7.7 (*)    Total Protein 6.4 (*)    Alkaline Phosphatase 130 (*)    All other components within normal limits  CBC WITH DIFFERENTIAL/PLATELET - Abnormal; Notable for the following components:   RBC 3.40 (*)    Hemoglobin 10.0 (*)    HCT 31.5 (*)    RDW 17.5 (*)    Abs Immature Granulocytes 0.08 (*)    All other components within normal limits  URINALYSIS, ROUTINE W REFLEX MICROSCOPIC - Abnormal; Notable for the following components:   Color, Urine STRAW (*)    Specific Gravity, Urine 1.003 (*)    Protein, ur 30 (*)    All other components within normal limits  CULTURE, BLOOD (ROUTINE X 2)  CULTURE, BLOOD (ROUTINE X 2)  LIPASE, BLOOD    EKG: None  Radiology: DG Chest 2 View Result Date: 09/17/2023 CLINICAL DATA:  cough EXAM: CHEST - 2 VIEW COMPARISON:  07/07/2023 FINDINGS: Cardiac silhouette is unremarkable. No pneumothorax or pleural effusion. The lungs are  clear. The visualized skeletal structures are unremarkable. IMPRESSION: No acute cardiopulmonary process. Electronically Signed   By: Fonda Field M.D.   On: 09/17/2023 19:03   CT ABDOMEN PELVIS W CONTRAST Result Date: 09/17/2023 CLINICAL DATA:  Abdominal pain, acute, nonlocalized.  Emesis. EXAM: CT ABDOMEN AND PELVIS WITH CONTRAST TECHNIQUE: Multidetector CT imaging of the abdomen and pelvis was performed using the standard protocol following bolus administration of intravenous contrast. RADIATION DOSE REDUCTION: This exam was performed according to the departmental dose-optimization program which includes automated exposure control, adjustment of the mA and/or kV according to patient size and/or use of iterative reconstruction technique. CONTRAST:  100mL OMNIPAQUE  IOHEXOL  300 MG/ML  SOLN COMPARISON:  11/23/2020. FINDINGS: Lower chest: The heart is enlarged and there is a trace pericardial effusion. Hazy ground-glass attenuation is noted at the lung bases. Hepatobiliary: No focal liver abnormality is seen. Status post cholecystectomy. No biliary dilatation. Pancreas: Unremarkable. No pancreatic ductal dilatation or surrounding inflammatory changes. Spleen: Normal in size without focal abnormality. Adrenals/Urinary Tract: There is a stable 2 cm nodule in the right adrenal gland, previously characterized as adenoma. The left adrenal gland is within normal limits. The kidneys enhance symmetrically. No renal calculus or hydronephrosis bilaterally. The bladder is unremarkable. Stomach/Bowel: The stomach is within normal limits. No bowel obstruction, free air, or pneumatosis is seen. There is diffuse thickening of the:. Appendix appears normal. Scattered diverticula are noted along the colon with Peri colonic fat stranding in the sigmoid colon in the left lower quadrant. No abscess is seen. Vascular/Lymphatic: Aortic atherosclerosis. There is a prominent lymph node in the left lower quadrant along the iliac chain,  likely reactive. Reproductive: Status post hysterectomy. No adnexal masses. Other: No abdominopelvic ascites. Multifocal fat containing umbilical hernias are noted in the anterior abdominal wall on the left superior to the umbilicus.  Musculoskeletal: Degenerative changes are present in the thoracolumbar spine. No acute osseous abnormality. IMPRESSION: 1. Findings compatible with sigmoid diverticulitis. 2. Diffuse thickening of the walls of the rectosigmoid colon suggesting superimposed colitis. 3. Hazy ground-glass attenuation at the lung bases, possible edema versus air trapping. 4. Stable right adrenal nodule, previously characterized as adenoma. 5. Aortic atherosclerosis. Electronically Signed   By: Leita Birmingham M.D.   On: 09/17/2023 19:01     Procedures   Medications Ordered in the ED  ondansetron  (ZOFRAN ) injection 4 mg (4 mg Intravenous Not Given 09/17/23 1756)  piperacillin -tazobactam (ZOSYN ) IVPB 3.375 g (3.375 g Intravenous New Bag/Given 09/17/23 2005)  acetaminophen  (TYLENOL ) tablet 650 mg (has no administration in time range)  potassium chloride  SA (KLOR-CON  M) CR tablet 40 mEq (has no administration in time range)  calcium  gluconate 1 g/ 50 mL sodium chloride  IVPB (has no administration in time range)  iohexol  (OMNIPAQUE ) 300 MG/ML solution 100 mL (100 mLs Intravenous Contrast Given 09/17/23 1850)                                    Medical Decision Making Amount and/or Complexity of Data Reviewed Labs: ordered. Radiology: ordered.  Risk OTC drugs. Prescription drug management.    This patient presents to the ED for concern of abdominal pain, this involves an extensive number of treatment options, and is a complaint that carries with it a high risk of complications and morbidity.  The differential diagnosis includes gastroenteritis, colitis, small bowel obstruction, appendicitis, cholecystitis, pancreatitis, nephrolithiasis, UTI, pyelonephritis   Co morbidities that  complicate the patient evaluation   OA, COPD, DM, fibromyalgia, HLD, HTN breast cancer on chemotherapy   Additional history obtained:  Dr. Barbaraann PCP   Problem List / ED Course / Critical interventions / Medication management  Patient presented for LLQ abdominal pain and nausea/vomiting. Patient also with worsening of her chronic diarrhea. Physical exam with LLQ tenderness to palpation. Rest of physical exam reassuring. Also with mild cough and rhinorrhea over the past 7 days. Patient endorsing straining with urination recently - bladder scan with <177ml. I Ordered, and personally interpreted labs.  UA without concern for infection.  CBC without leukocytosis.  There is mild anemia with hemoglobin at 10.0 today which is near patient's baseline.  CMP with mild hypokalemia 3.4.  ALP mildly elevated at 130.  Lipase within normal limits.  Blood cultures pending. I ordered imaging studies including CT Abd/Pelvis/chest xray. I independently visualized and interpreted imaging and I agree with the radiologist interpretation of diverticulitis. Shared all results with patient.  Answered all questions.  Patient tolerating PO well. I recommended inpatient treatment of patient's diverticulitis educating patient that her symptoms may worsen and could lead to further complications. Patient declining this and stated that she feels fine and would like to proceed with outpatient ABX management at this time. Patient refusing narcotic pain control and would prefer to continue to using tylenol  instead. Patient understands to seek emergency care if symptoms start worsening. Patient agrees to get one dose of IV ABX while in ED prior to discharge.  For patient's right foot cellulitis, the Augmentin  should help with this. VAS US  is not currently here so I will order patient outpatient US  for Tuesday to make sure that there is not a DVT causing the unilateral erythema and swelling. Staffed with Dr. Rogelia. I have reviewed  the patients home medicines and have made adjustments as  needed The patient has been appropriately medically screened and/or stabilized in the ED. I have low suspicion for any other emergent medical condition which would require further screening, evaluation or treatment in the ED or require inpatient management. At time of discharge the patient is hemodynamically stable and in no acute distress. I have discussed work-up results and diagnosis with patient and answered all questions. Patient is agreeable with discharge plan. We discussed strict return precautions for returning to the emergency department and they verbalized understanding.     Social Determinants of Health:  none       Final diagnoses:  Diverticulitis    ED Discharge Orders          Ordered    amoxicillin -clavulanate (AUGMENTIN ) 875-125 MG tablet  Every 12 hours        09/17/23 2004    ondansetron  (ZOFRAN -ODT) 4 MG disintegrating tablet  Every 8 hours PRN        09/17/23 2005               Hoy Nidia FALCON, PA-C 09/17/23 2022    Rogelia Jerilynn RAMAN, MD 09/20/23 [phone]

## 2023-09-17 NOTE — ED Triage Notes (Signed)
 Patient to ED by POV with c/o emesis, left side pain and swelling to R foot. She has HX of cancer and diabetes. Was given lasix  for edema but denies urinating or decrease in swelling.

## 2023-09-17 NOTE — ED Notes (Signed)
 Bladder scan conducted; 39 ml present

## 2023-09-17 NOTE — Discharge Instructions (Addendum)
 You need to follow-up with your primary care provider.  Seek emergency care if experiencing any new or worsening symptoms.

## 2023-09-19 ENCOUNTER — Inpatient Hospital Stay: Attending: Hematology and Oncology

## 2023-09-19 ENCOUNTER — Inpatient Hospital Stay

## 2023-09-19 DIAGNOSIS — Z79620 Long term (current) use of immunosuppressive biologic: Secondary | ICD-10-CM | POA: Insufficient documentation

## 2023-09-19 DIAGNOSIS — D45 Polycythemia vera: Secondary | ICD-10-CM | POA: Insufficient documentation

## 2023-09-19 DIAGNOSIS — E1151 Type 2 diabetes mellitus with diabetic peripheral angiopathy without gangrene: Secondary | ICD-10-CM | POA: Insufficient documentation

## 2023-09-19 DIAGNOSIS — Z171 Estrogen receptor negative status [ER-]: Secondary | ICD-10-CM | POA: Insufficient documentation

## 2023-09-19 DIAGNOSIS — Z5112 Encounter for antineoplastic immunotherapy: Secondary | ICD-10-CM | POA: Diagnosis not present

## 2023-09-19 DIAGNOSIS — F17210 Nicotine dependence, cigarettes, uncomplicated: Secondary | ICD-10-CM | POA: Insufficient documentation

## 2023-09-19 DIAGNOSIS — L03115 Cellulitis of right lower limb: Secondary | ICD-10-CM | POA: Diagnosis not present

## 2023-09-19 DIAGNOSIS — I10 Essential (primary) hypertension: Secondary | ICD-10-CM | POA: Diagnosis not present

## 2023-09-19 DIAGNOSIS — C50412 Malignant neoplasm of upper-outer quadrant of left female breast: Secondary | ICD-10-CM | POA: Diagnosis present

## 2023-09-19 DIAGNOSIS — Z79899 Other long term (current) drug therapy: Secondary | ICD-10-CM | POA: Diagnosis not present

## 2023-09-19 DIAGNOSIS — Z5111 Encounter for antineoplastic chemotherapy: Secondary | ICD-10-CM | POA: Diagnosis present

## 2023-09-19 LAB — CBC WITH DIFFERENTIAL (CANCER CENTER ONLY)
Abs Immature Granulocytes: 0.08 K/uL — ABNORMAL HIGH (ref 0.00–0.07)
Basophils Absolute: 0.1 K/uL (ref 0.0–0.1)
Basophils Relative: 1 %
Eosinophils Absolute: 0.2 K/uL (ref 0.0–0.5)
Eosinophils Relative: 3 %
HCT: 30 % — ABNORMAL LOW (ref 36.0–46.0)
Hemoglobin: 9.9 g/dL — ABNORMAL LOW (ref 12.0–15.0)
Immature Granulocytes: 1 %
Lymphocytes Relative: 20 %
Lymphs Abs: 1.6 K/uL (ref 0.7–4.0)
MCH: 30.4 pg (ref 26.0–34.0)
MCHC: 33 g/dL (ref 30.0–36.0)
MCV: 92 fL (ref 80.0–100.0)
Monocytes Absolute: 0.5 K/uL (ref 0.1–1.0)
Monocytes Relative: 6 %
Neutro Abs: 5.8 K/uL (ref 1.7–7.7)
Neutrophils Relative %: 69 %
Platelet Count: 375 K/uL (ref 150–400)
RBC: 3.26 MIL/uL — ABNORMAL LOW (ref 3.87–5.11)
RDW: 17.7 % — ABNORMAL HIGH (ref 11.5–15.5)
WBC Count: 8.2 K/uL (ref 4.0–10.5)
nRBC: 0 % (ref 0.0–0.2)

## 2023-09-19 LAB — CMP (CANCER CENTER ONLY)
ALT: 10 U/L (ref 0–44)
AST: 18 U/L (ref 15–41)
Albumin: 3.6 g/dL (ref 3.5–5.0)
Alkaline Phosphatase: 136 U/L — ABNORMAL HIGH (ref 38–126)
Anion gap: 13 (ref 5–15)
BUN: 6 mg/dL (ref 6–20)
CO2: 24 mmol/L (ref 22–32)
Calcium: 7.4 mg/dL — ABNORMAL LOW (ref 8.9–10.3)
Chloride: 103 mmol/L (ref 98–111)
Creatinine: 0.9 mg/dL (ref 0.44–1.00)
GFR, Estimated: >60 mL/min (ref >60)
Glucose, Bld: 171 mg/dL — ABNORMAL HIGH (ref 70–99)
Potassium: 3.4 mmol/L — ABNORMAL LOW (ref 3.5–5.1)
Sodium: 139 mmol/L (ref 135–145)
Total Bilirubin: <0.2 mg/dL (ref 0.0–1.2)
Total Protein: 6.4 g/dL — ABNORMAL LOW (ref 6.5–8.1)

## 2023-09-19 NOTE — Progress Notes (Signed)
 Per MD Gudena, no tx today due to recent antibiotics. See ED note from 09/18/2023. Pt verbalizes understanding and agrees with plan of. Pt is aware of upcoming appts next week.

## 2023-09-22 LAB — CULTURE, BLOOD (ROUTINE X 2)
Culture: NO GROWTH
Culture: NO GROWTH
Special Requests: ADEQUATE

## 2023-09-25 ENCOUNTER — Other Ambulatory Visit: Payer: Self-pay | Admitting: Adult Health

## 2023-09-25 ENCOUNTER — Inpatient Hospital Stay: Admitting: Licensed Clinical Social Worker

## 2023-09-25 DIAGNOSIS — Z171 Estrogen receptor negative status [ER-]: Secondary | ICD-10-CM

## 2023-09-25 DIAGNOSIS — R609 Edema, unspecified: Secondary | ICD-10-CM

## 2023-09-25 NOTE — Progress Notes (Signed)
 [facility] Healthcare Advance Directives Clinical Social Work  Patient presented to Advance Directives Clinic  to review and complete healthcare advance directives.  Clinical Social Worker met with patient and her daughter.  The patient designated Renea Shed (daughter [phone]) as their primary healthcare agent and Cy Gander (sister [phone]) as their secondary agent.  Patient also completed healthcare living will.    Documents were notarized and copies made for patient/family. Clinical Social Worker will send documents to medical records to be scanned into patient's chart. Clinical Social Worker encouraged patient/family to contact with any additional questions or concerns.   Devere JONELLE Manna, LCSW Clinical Social Worker [facility]        Patient is participating in a Managed Medicaid Plan:  Yes

## 2023-09-26 ENCOUNTER — Inpatient Hospital Stay

## 2023-09-26 ENCOUNTER — Other Ambulatory Visit (HOSPITAL_COMMUNITY): Payer: Self-pay

## 2023-09-26 ENCOUNTER — Inpatient Hospital Stay: Admitting: Licensed Clinical Social Worker

## 2023-09-26 ENCOUNTER — Inpatient Hospital Stay (HOSPITAL_BASED_OUTPATIENT_CLINIC_OR_DEPARTMENT_OTHER): Admitting: Adult Health

## 2023-09-26 ENCOUNTER — Ambulatory Visit (HOSPITAL_BASED_OUTPATIENT_CLINIC_OR_DEPARTMENT_OTHER)
Admission: RE | Admit: 2023-09-26 | Discharge: 2023-09-26 | Disposition: A | Discharge status: Home / Self Care | Source: Ambulatory Visit | Attending: Adult Health | Admitting: Adult Health

## 2023-09-26 ENCOUNTER — Encounter: Payer: Self-pay | Admitting: Adult Health

## 2023-09-26 VITALS — BP 168/63 | HR 90 | Temp 97.9°F | Resp 17 | Wt 176.6 lb

## 2023-09-26 DIAGNOSIS — Z09 Encounter for follow-up examination after completed treatment for conditions other than malignant neoplasm: Secondary | ICD-10-CM

## 2023-09-26 DIAGNOSIS — Z5112 Encounter for antineoplastic immunotherapy: Secondary | ICD-10-CM | POA: Diagnosis not present

## 2023-09-26 DIAGNOSIS — C50412 Malignant neoplasm of upper-outer quadrant of left female breast: Secondary | ICD-10-CM | POA: Insufficient documentation

## 2023-09-26 DIAGNOSIS — M7989 Other specified soft tissue disorders: Secondary | ICD-10-CM

## 2023-09-26 DIAGNOSIS — Z171 Estrogen receptor negative status [ER-]: Secondary | ICD-10-CM

## 2023-09-26 LAB — CBC WITH DIFFERENTIAL (CANCER CENTER ONLY)
Abs Immature Granulocytes: 0.1 K/uL — ABNORMAL HIGH (ref 0.00–0.07)
Basophils Absolute: 0.1 K/uL (ref 0.0–0.1)
Basophils Relative: 1 %
Eosinophils Absolute: 0.1 K/uL (ref 0.0–0.5)
Eosinophils Relative: 1 %
HCT: 29 % — ABNORMAL LOW (ref 36.0–46.0)
Hemoglobin: 9.7 g/dL — ABNORMAL LOW (ref 12.0–15.0)
Immature Granulocytes: 1 %
Lymphocytes Relative: 18 %
Lymphs Abs: 1.7 K/uL (ref 0.7–4.0)
MCH: 29.6 pg (ref 26.0–34.0)
MCHC: 33.4 g/dL (ref 30.0–36.0)
MCV: 88.4 fL (ref 80.0–100.0)
Monocytes Absolute: 0.7 K/uL (ref 0.1–1.0)
Monocytes Relative: 8 %
Neutro Abs: 7 K/uL (ref 1.7–7.7)
Neutrophils Relative %: 71 %
Platelet Count: 362 K/uL (ref 150–400)
RBC: 3.28 MIL/uL — ABNORMAL LOW (ref 3.87–5.11)
RDW: 16.7 % — ABNORMAL HIGH (ref 11.5–15.5)
WBC Count: 9.7 K/uL (ref 4.0–10.5)
nRBC: 0 % (ref 0.0–0.2)

## 2023-09-26 LAB — CMP (CANCER CENTER ONLY)
ALT: 9 U/L (ref 0–44)
AST: 11 U/L — ABNORMAL LOW (ref 15–41)
Albumin: 3.2 g/dL — ABNORMAL LOW (ref 3.5–5.0)
Alkaline Phosphatase: 135 U/L — ABNORMAL HIGH (ref 38–126)
Anion gap: 6 (ref 5–15)
BUN: 6 mg/dL (ref 6–20)
CO2: 29 mmol/L (ref 22–32)
Calcium: 7.4 mg/dL — ABNORMAL LOW (ref 8.9–10.3)
Chloride: 107 mmol/L (ref 98–111)
Creatinine: 0.84 mg/dL (ref 0.44–1.00)
GFR, Estimated: >60 mL/min (ref >60)
Glucose, Bld: 108 mg/dL — ABNORMAL HIGH (ref 70–99)
Potassium: 3.8 mmol/L (ref 3.5–5.1)
Sodium: 142 mmol/L (ref 135–145)
Total Bilirubin: 0.2 mg/dL (ref 0.0–1.2)
Total Protein: 6.1 g/dL — ABNORMAL LOW (ref 6.5–8.1)

## 2023-09-26 MED ORDER — POTASSIUM CHLORIDE ER 10 MEQ PO TBCR
10.0000 meq | EXTENDED_RELEASE_TABLET | Freq: Two times a day (BID) | ORAL | 0 refills | Status: DC
  Filled 2023-09-26 – 2023-10-04 (×2): qty 28, 14d supply, fill #0

## 2023-09-26 MED ORDER — SODIUM CHLORIDE 0.9 % IV SOLN: INTRAVENOUS | Status: DC

## 2023-09-26 MED ORDER — DIPHENHYDRAMINE HCL 25 MG PO CAPS
50.0000 mg | ORAL_CAPSULE | Freq: Once | ORAL | Status: AC
  Administered 2023-09-26: 50 mg via ORAL
  Filled 2023-09-26: qty 2

## 2023-09-26 MED ORDER — ACETAMINOPHEN 325 MG PO TABS
650.0000 mg | ORAL_TABLET | Freq: Once | ORAL | Status: AC
  Administered 2023-09-26: 650 mg via ORAL
  Filled 2023-09-26: qty 2

## 2023-09-26 MED ORDER — TRASTUZUMAB-ANNS CHEMO 150 MG IV SOLR
6.0000 mg/kg | Freq: Once | INTRAVENOUS | Status: AC
  Administered 2023-09-26: 483 mg via INTRAVENOUS
  Filled 2023-09-26: qty 23

## 2023-09-26 NOTE — Progress Notes (Signed)
 [facility] Cancer Follow up:    Pcp, No No address on file   DIAGNOSIS:  Cancer Staging  Malignant neoplasm of upper-outer quadrant of left breast in female, estrogen receptor negative (HCC) Staging form: Breast, AJCC 8th Edition - Clinical: Stage IA (cT1c, cN0, cM0, G2, ER-, PR-, HER2+) - Signed by Odean Potts, MD on 06/07/2023 Stage prefix: Initial diagnosis Histologic grading system: 3 grade system - Pathologic stage from 06/29/2023: Stage IA (pT1c, pN0, cM0, G2, ER-, PR-, HER2+) - Signed by Crawford Morna Pickle, NP on 09/11/2023 Stage prefix: Initial diagnosis Histologic grading system: 3 grade system    SUMMARY OF ONCOLOGIC HISTORY: Oncology History  Malignant neoplasm of upper-outer quadrant of left breast in female, estrogen receptor negative (HCC)  05/31/2023 Initial Diagnosis   Screening mammogram detected left breast mass and asymmetry retroareolar region, irregular spiculated mass 1.1 cm, axilla negative, ultrasound measured 1.2 cm, biopsy: Grade 2 IDC ER 0% PR 0% Ki67 15%, HER2 3+ positive   06/07/2023 Cancer Staging   Staging form: Breast, AJCC 8th Edition - Clinical: Stage IA (cT1c, cN0, cM0, G2, ER-, PR-, HER2+) - Signed by Odean Potts, MD on 06/07/2023 Stage prefix: Initial diagnosis Histologic grading system: 3 grade system   06/29/2023 Surgery   Left lumpectomy: 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, margins negative, 0/3 lymph nodes negative, ER 0%, PR 0%, HER2 3+ positive, Ki-67 15%    06/29/2023 Cancer Staging   Staging form: Breast, AJCC 8th Edition - Pathologic stage from 06/29/2023: Stage IA (pT1c, pN0, cM0, G2, ER-, PR-, HER2+) - Signed by Crawford Morna Pickle, NP on 09/11/2023 Stage prefix: Initial diagnosis Histologic grading system: 3 grade system   08/07/2023 -  Chemotherapy   Patient is on Treatment Plan : BREAST Paclitaxel  + Trastuzumab  q7d / Trastuzumab  q21d       CURRENT THERAPY: taxol /herceptin  #7  INTERVAL  HISTORY:  Discussed the use of AI scribe software for clinical note transcription with the patient, who gave verbal consent to proceed.  Mary Campbell 58 y.o. female returns for    Patient Active Problem List   Diagnosis Date Noted   Port-A-Cath in place 08/07/2023   Severe obstructive sleep apnea 06/19/2023   Antiplatelet or antithrombotic long-term use 06/15/2023   Positive colorectal cancer screening using Cologuard test 06/15/2023   Malignant neoplasm of upper-outer quadrant of left breast in female, estrogen receptor negative (HCC) 06/05/2023   Excessive daytime sleepiness 12/22/2022   DOE (dyspnea on exertion) 12/22/2022   Centrilobular emphysema (HCC) 12/22/2022   Claudication in peripheral vascular disease (HCC) 10/04/2018   Essential hypertension 10/04/2018   Hyperlipidemia 10/04/2018   Tobacco abuse 10/04/2018   Family history of heart disease 10/04/2018    is allergic to codeine, other, sulfonamide derivatives, and lisinopril.  MEDICAL HISTORY: Past Medical History:  Diagnosis Date   Anxiety    Arthritis    Breast cancer (HCC)    left   Chronic back pain    COPD (chronic obstructive pulmonary disease) (HCC)    Diabetes mellitus    Difficult intubation    Diverticulitis    Dyspnea    with exertion   Fibromyalgia    Hyperlipidemia    Hypertension    PAD (peripheral artery disease) (HCC)    a. PV angio 10/2018 - bilateral SFA disease s/p L intervention.   PAD (peripheral artery disease) (HCC)    PONV (postoperative nausea and vomiting)    Sleep apnea    Tobacco abuse  SURGICAL HISTORY: Past Surgical History:  Procedure Laterality Date   ABDOMINAL AORTOGRAM W/LOWER EXTREMITY Right 10/18/2018   Procedure: ABDOMINAL AORTOGRAM W/LOWER EXTREMITY;  Surgeon: Court Dorn PARAS, MD;  Location: [facility];  Service: Cardiovascular;  Laterality: Right;   ABDOMINAL AORTOGRAM W/LOWER EXTREMITY N/A 04/02/2020   Procedure: ABDOMINAL AORTOGRAM W/LOWER  EXTREMITY;  Surgeon: Court Dorn PARAS, MD;  Location: [facility];  Service: Cardiovascular;  Laterality: N/A;   ABDOMINAL AORTOGRAM W/LOWER EXTREMITY N/A 04/23/2020   Procedure: ABDOMINAL AORTOGRAM W/LOWER EXTREMITY;  Surgeon: Court Dorn PARAS, MD;  Location: [facility];  Service: Cardiovascular;  Laterality: N/A;   ABDOMINAL HYSTERECTOMY     BREAST BIOPSY Left 05/31/2023   US  LT BREAST BX W LOC DEV 1ST LESION IMG BX SPEC US  GUIDE 05/31/2023 [facility]   BREAST BIOPSY  06/27/2023   MM LT RADIOACTIVE SEED LOC MAMMO GUIDE 06/27/2023 [facility]   BREAST LUMPECTOMY WITH RADIOACTIVE SEED AND SENTINEL LYMPH NODE BIOPSY Left 06/29/2023   Procedure: BREAST LUMPECTOMY WITH RADIOACTIVE SEED AND SENTINEL LYMPH NODE BIOPSY;  Surgeon: Curvin Deward MOULD, MD;  Location: [facility];  Service: General;  Laterality: Left;  LEFT BREAST RADIOACTIVE SEED LOCALIZED LUMPECTOMY   SENTINEL NODE BIOPSY   CHOLECYSTECTOMY     PERIPHERAL VASCULAR ATHERECTOMY Left 10/18/2018   Procedure: PERIPHERAL VASCULAR ATHERECTOMY;  Surgeon: Court Dorn PARAS, MD;  Location: [facility];  Service: Cardiovascular;  Laterality: Left;  SFA   PERIPHERAL VASCULAR ATHERECTOMY Right 11/22/2018   Procedure: PERIPHERAL VASCULAR ATHERECTOMY;  Surgeon: Court Dorn PARAS, MD;  Location: [facility] CV [facility];  Service: Cardiovascular;  Laterality: Right;  SFA   PERIPHERAL VASCULAR ATHERECTOMY  04/23/2020   Procedure: PERIPHERAL VASCULAR ATHERECTOMY;  Surgeon: Court Dorn PARAS, MD;  Location: [facility] IN[facility];  Service: Cardiovascular;;   PERIPHERAL VASCULAR BALLOON ANGIOPLASTY Right 11/22/2018   Procedure: PERIPHERAL VASCULAR BALLOON ANGIOPLASTY;  Surgeon: Court Dorn PARAS, MD;  [facility]: [facility];  Service: Cardiovascular;  Laterality: Right;  SFA   PERIPHERAL VASCULAR BALLOON ANGIOPLASTY  04/23/2020   Procedure: PERIPHERAL VASCULAR BALLOON ANGIOPLASTY;  Surgeon: Court Dorn PARAS, MD;  Location: [facility];   Service: Cardiovascular;;   PERIPHERAL VASCULAR INTERVENTION Left 04/02/2020   Procedure: PERIPHERAL VASCULAR INTERVENTION;  Surgeon: Court Dorn PARAS, MD;  Location: [facility];  Service: Cardiovascular;  Laterality: Left;   PORTACATH PLACEMENT N/A 06/29/2023   Procedure: INSERTION, TUNNELED CENTRAL VENOUS DEVICE, WITH PORT;  Surgeon: Curvin Deward MOULD, MD;  Location: [facility];  Service: General;  Laterality: N/A;  PORT PLACEMENT WITH ULTRASOUND GUIDANCE GEN w/PEC BLOCK    SOCIAL HISTORY: Social History   Socioeconomic History   Marital status: Legally Separated    Spouse name: Not on file   Number of children: 2   Years of education: Not on file   Highest education level: Not on file  Occupational History   Occupation: disabled  Tobacco Use   Smoking status: Some Days    Current packs/day: 0.00    Types: Cigarettes    Last attempt to quit: 08/11/2022    Years since quitting: 1.1   Smokeless tobacco: Never   Tobacco comments:    Trying quit with patch  Vaping Use   Vaping status: Never Used  Substance and Sexual Activity   Alcohol use: No   Drug use: No   Sexual activity: Yes    Birth control/protection: Surgical  Other Topics Concern   Not on file  Social History Narrative   Not on file   Social Drivers of Health   Financial Resource Strain: Not on file  Food Insecurity: Food Insecurity Present (09/26/2023)   Hunger Vital Sign    Worried About Running Out of Food in the Last Year: Sometimes true    Ran Out of Food in the Last Year: Never true  Transportation Needs: No Transportation Needs (06/07/2023)   PRAPARE - Administrator, Civil Service (Medical): No    Lack of Transportation (Non-Medical): No  Physical Activity: Not on file  Stress: Not on file  Social Connections: Not on file  Intimate Partner Violence: Not At Risk (06/07/2023)   Humiliation, Afraid, Rape, and Kick questionnaire    Fear of Current or Ex-Partner: No    Emotionally Abused: No     Physically Abused: No    Sexually Abused: No    FAMILY HISTORY: Family History  Problem Relation Age of Onset   Diabetes Mother    Hypertension Mother    Hyperlipidemia Mother    Heart attack Father    Heart disease Father    Hypertension Father    Hyperlipidemia Father    Breast cancer Neg Hx    Colon cancer Neg Hx    Esophageal cancer Neg Hx     Review of Systems  Constitutional:  Negative for appetite change, chills, fatigue, fever and unexpected weight change.  HENT:   Negative for hearing loss, lump/mass and trouble swallowing.   Eyes:  Negative for eye problems and icterus.  Respiratory:  Negative for chest tightness, cough and shortness of breath.   Cardiovascular:  Negative for chest pain, leg swelling and palpitations.  Gastrointestinal:  Negative for abdominal distention, abdominal pain, constipation, diarrhea, nausea and vomiting.  Endocrine: Negative for hot flashes.  Genitourinary:  Negative for difficulty urinating.   Musculoskeletal:  Negative for arthralgias.  Skin:  Negative for itching and rash.  Neurological:  Positive for numbness. Negative for dizziness, extremity weakness and headaches.  Hematological:  Negative for adenopathy. Does not bruise/bleed easily.  Psychiatric/Behavioral:  Negative for depression. The patient is not nervous/anxious.       PHYSICAL EXAMINATION   Onc Performance Status - 09/26/23 0939       ECOG Perf Status   ECOG Perf Status Restricted in physically strenuous activity but ambulatory and able to carry out work of a light or sedentary nature, e.g., light house work, office work      KPS SCALE   KPS % SCORE Able to carry on normal activity, minor s/s of disease          Vitals:   09/26/23 0935  BP: (!) 168/63  Pulse: 90  Resp: 17  Temp: 97.9 F (36.6 C)  SpO2: 99%    Physical Exam Constitutional:      General: She is not in acute distress.    Appearance: Normal appearance. She is not toxic-appearing.  HENT:      Head: Normocephalic and atraumatic.     Mouth/Throat:     Mouth: Mucous membranes are moist.     Pharynx: Oropharynx is clear. No oropharyngeal exudate or posterior oropharyngeal erythema.  Eyes:     General: No scleral icterus. Cardiovascular:     Rate and Rhythm: Normal rate and regular rhythm.     Pulses: Normal pulses.     Heart sounds: Normal heart sounds.  Pulmonary:     Effort: Pulmonary effort is normal.     Breath sounds: Normal breath  sounds.  Abdominal:     General: Abdomen is flat. Bowel sounds are normal. There is no distension.     Palpations: Abdomen is soft.     Tenderness: There is no abdominal tenderness.  Musculoskeletal:        General: No swelling (right lower leg swollen, more than left).     Cervical back: Neck supple.  Lymphadenopathy:     Cervical: No cervical adenopathy.  Skin:    General: Skin is warm and dry.     Findings: No rash.  Neurological:     General: No focal deficit present.     Mental Status: She is alert.  Psychiatric:        Mood and Affect: Mood normal.        Behavior: Behavior normal.     LABORATORY DATA:  CBC    Component Value Date/Time   WBC 9.7 09/26/2023 0919   WBC 5.8 09/17/2023 1755   RBC 3.28 (L) 09/26/2023 0919   HGB 9.7 (L) 09/26/2023 0919   HGB 12.0 04/15/2020 1537   HCT 29.0 (L) 09/26/2023 0919   HCT 36.5 04/15/2020 1537   PLT 362 09/26/2023 0919   PLT 323 04/15/2020 1537   MCV 88.4 09/26/2023 0919   MCV 87 04/15/2020 1537   MCH 29.6 09/26/2023 0919   MCHC 33.4 09/26/2023 0919   RDW 16.7 (H) 09/26/2023 0919   RDW 14.4 04/15/2020 1537   LYMPHSABS 1.7 09/26/2023 0919   LYMPHSABS 2.5 10/25/2018 1552   MONOABS 0.7 09/26/2023 0919   EOSABS 0.1 09/26/2023 0919   EOSABS 0.2 10/25/2018 1552   BASOSABS 0.1 09/26/2023 0919   BASOSABS 0.1 10/25/2018 1552    CMP     Component Value Date/Time   NA 142 09/26/2023 0919   NA 139 04/15/2020 1537   K 3.8 09/26/2023 0919   CL 107 09/26/2023 0919   CO2 29  09/26/2023 0919   GLUCOSE 108 (H) 09/26/2023 0919   BUN 6 09/26/2023 0919   BUN 9 04/15/2020 1537   CREATININE 0.84 09/26/2023 0919   CALCIUM  7.4 (L) 09/26/2023 0919   PROT 6.1 (L) 09/26/2023 0919   PROT 7.0 03/11/2020 1005   ALBUMIN 3.2 (L) 09/26/2023 0919   ALBUMIN 4.3 03/11/2020 1005   AST 11 (L) 09/26/2023 0919   ALT 9 09/26/2023 0919   ALKPHOS 135 (H) 09/26/2023 0919   BILITOT 0.2 09/26/2023 0919   GFRNONAA >60 09/26/2023 0919   GFRAA 73 03/11/2020 1005     ASSESSMENT and THERAPY PLAN:   Malignant neoplasm of upper-outer quadrant of left breast in female, estrogen receptor negative (HCC) 06/29/2023: Left lumpectomy: 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, margins negative, 0/3 lymph nodes negative, ER 0%, PR 0%, HER2 3+ positive, Ki-67 15%   Treatment plan: Adjuvant chemotherapy with Taxol  Herceptin  followed by Herceptin  maintenance for 1 year Adjuvant radiation therapy --------------------------------------------------------------------------------------------------- Current treatment: Taxol  Herceptin  Echo 07/03/2023: EF 70 to 75% Assessment and Plan Assessment & Plan Malignant neoplasm of upper-outer quadrant of left breast Taxol  discontinued due to neuropathy. Radiation therapy planned. - Administer Herceptin  every three weeks. - Re-referred to Dr. Izell in radiation oncology.  - Repeat echo ordered - RCT in 3 weeks for next treatment.    Chemotherapy-induced peripheral neuropathy Experiencing worsening numbness and tingling. Discontinued Taxol  to prevent further deterioration. - Discontinue Taxol .  Right lower extremity swelling, rule out deep vein thrombosis Right leg swelling with numbness. Rule out deep vein thrombosis. - Schedule Doppler ultrasound to rule  out deep vein thrombosis.     All questions were answered. The patient knows to call the clinic with any problems, questions or concerns. We can certainly see the patient much sooner if  necessary.  Total encounter time:30 minutes*in face-to-face visit time, chart review, lab review, care coordination, order entry, and documentation of the encounter time.    Morna Kendall, NP 09/26/23 4:24 PM Medical Oncology and Hematology [facility] [address] [place], [place] [address] Tel. [phone]    Fax. [phone]  *Total Encounter Time as defined by the Centers for Medicare and Medicaid Services includes, in addition to the face-to-face time of a patient visit (documented in the note above) non-face-to-face time: obtaining and reviewing outside history, ordering and reviewing medications, tests or procedures, care coordination (communications with other health care professionals or caregivers) and documentation in the medical record.

## 2023-09-26 NOTE — Progress Notes (Signed)
 [facility] Clinical Social Work  Clinical Social Work was referred by self for financial concerns.  Clinical Social Worker met with patient to offer support and assess for needs.    Pt is on disability and lives with her daughter who works but income varies monthly.  Their rent has increased and other expenses are high currently, leading to financial difficulty.  Pt does receive SNAP and has qualified for the [employer].    Interventions: Referred patient to community resources: [employer]  Provided information on [employer] and document required for application Provided information on local resources & food pantries Bag of food from [employer] given today     Follow Up Plan:  Patient will send CSW a bill for submission with [employer] application. Pt will notify CSW when she is here for an appt and in need of a food bag    Mary Campbell E Fredda Clarida, LCSW  Clinical Social Worker [facility]        Patient is participating in a Managed Medicaid Plan:  Yes

## 2023-09-26 NOTE — Assessment & Plan Note (Signed)
 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

## 2023-09-28 ENCOUNTER — Other Ambulatory Visit: Payer: Self-pay | Admitting: *Deleted

## 2023-09-28 ENCOUNTER — Ambulatory Visit (HOSPITAL_COMMUNITY)
Admission: RE | Admit: 2023-09-28 | Discharge: 2023-09-28 | Disposition: A | Discharge status: Home / Self Care | Source: Ambulatory Visit | Attending: Adult Health | Admitting: Adult Health

## 2023-09-28 DIAGNOSIS — Z5181 Encounter for therapeutic drug level monitoring: Secondary | ICD-10-CM

## 2023-09-28 DIAGNOSIS — Z01818 Encounter for other preprocedural examination: Secondary | ICD-10-CM | POA: Insufficient documentation

## 2023-09-28 DIAGNOSIS — Z79899 Other long term (current) drug therapy: Secondary | ICD-10-CM | POA: Insufficient documentation

## 2023-09-28 DIAGNOSIS — R0600 Dyspnea, unspecified: Secondary | ICD-10-CM | POA: Diagnosis not present

## 2023-09-28 DIAGNOSIS — I10 Essential (primary) hypertension: Secondary | ICD-10-CM | POA: Diagnosis not present

## 2023-09-28 LAB — ECHOCARDIOGRAM COMPLETE
AR max vel: 2.11 cm2
AV Area VTI: 2.21 cm2
AV Area mean vel: 2.42 cm2
AV Mean grad: 8 mmHg
AV Peak grad: 18 mmHg
Ao pk vel: 2.12 m/s
Area-P 1/2: 5.34 cm2
Calc EF: 71.5 %
S' Lateral: 4.1 cm
Single Plane A2C EF: 75.1 %
Single Plane A4C EF: 71 %

## 2023-10-02 ENCOUNTER — Other Ambulatory Visit

## 2023-10-02 ENCOUNTER — Ambulatory Visit

## 2023-10-04 ENCOUNTER — Other Ambulatory Visit (HOSPITAL_COMMUNITY): Payer: Self-pay

## 2023-10-04 ENCOUNTER — Other Ambulatory Visit: Payer: Self-pay

## 2023-10-09 ENCOUNTER — Ambulatory Visit: Admitting: Hematology and Oncology

## 2023-10-09 ENCOUNTER — Other Ambulatory Visit

## 2023-10-09 ENCOUNTER — Ambulatory Visit: Payer: Self-pay

## 2023-10-09 ENCOUNTER — Encounter: Payer: Self-pay | Admitting: *Deleted

## 2023-10-09 ENCOUNTER — Ambulatory Visit

## 2023-10-10 ENCOUNTER — Telehealth: Payer: Self-pay | Admitting: *Deleted

## 2023-10-10 NOTE — Telephone Encounter (Signed)
 Received call from pt stating she would like to discuss at next visit the possibility of having dental work done.  Pt states she would need all of her upper teeth extracted and a few lower teeth extracted as well.  Pt states she does not currently have any dental infections but does have a few loose teeth.  Note placed in appt note for NP to further discuss next week.

## 2023-10-12 ENCOUNTER — Other Ambulatory Visit: Payer: Self-pay | Admitting: Adult Health

## 2023-10-12 DIAGNOSIS — Z171 Estrogen receptor negative status [ER-]: Secondary | ICD-10-CM

## 2023-10-12 DIAGNOSIS — R609 Edema, unspecified: Secondary | ICD-10-CM

## 2023-10-13 ENCOUNTER — Other Ambulatory Visit (HOSPITAL_COMMUNITY): Payer: Self-pay

## 2023-10-15 ENCOUNTER — Other Ambulatory Visit: Payer: Self-pay | Admitting: Cardiovascular Disease

## 2023-10-15 ENCOUNTER — Other Ambulatory Visit: Payer: Self-pay | Admitting: Nurse Practitioner

## 2023-10-15 DIAGNOSIS — J432 Centrilobular emphysema: Secondary | ICD-10-CM

## 2023-10-16 ENCOUNTER — Other Ambulatory Visit

## 2023-10-16 ENCOUNTER — Ambulatory Visit

## 2023-10-16 ENCOUNTER — Telehealth: Payer: Self-pay | Admitting: *Deleted

## 2023-10-16 ENCOUNTER — Other Ambulatory Visit (HOSPITAL_COMMUNITY): Payer: Self-pay

## 2023-10-16 NOTE — Telephone Encounter (Signed)
 Copied from CRM #[id number]. Topic: Clinical - Order For Equipment >> Oct 12, 2023  9:14 AM Leila C wrote: Reason for CRM: Rogilo from [facility] [facility] [phone] and fax# [phone] is checking on a new order for prescription request for cpap supplies and chart notes relating to patient being on a cpap faxed today 10/12/23 and when the last time patient was seen in the office. Informed Roglio, patient was last seen in the office with Dr. Neda was 09/06/23. Per CAL, received the fax and the turn around time is 7-10 days until Dr. Neda is the office. Roglio verbalized understanding and denies any other concerns.  Routing to Amy as FYI.

## 2023-10-17 ENCOUNTER — Other Ambulatory Visit (HOSPITAL_COMMUNITY): Payer: Self-pay

## 2023-10-17 ENCOUNTER — Inpatient Hospital Stay

## 2023-10-17 ENCOUNTER — Encounter: Payer: Self-pay | Admitting: Adult Health

## 2023-10-17 ENCOUNTER — Inpatient Hospital Stay: Admitting: Adult Health

## 2023-10-17 VITALS — BP 179/72 | HR 79 | Temp 98.4°F | Resp 18

## 2023-10-17 VITALS — BP 178/65 | HR 81 | Temp 98.0°F | Resp 18 | Ht 64.0 in | Wt 165.6 lb

## 2023-10-17 DIAGNOSIS — C50412 Malignant neoplasm of upper-outer quadrant of left female breast: Secondary | ICD-10-CM | POA: Diagnosis not present

## 2023-10-17 DIAGNOSIS — L0390 Cellulitis, unspecified: Secondary | ICD-10-CM

## 2023-10-17 DIAGNOSIS — Z5112 Encounter for antineoplastic immunotherapy: Secondary | ICD-10-CM | POA: Diagnosis not present

## 2023-10-17 DIAGNOSIS — Z171 Estrogen receptor negative status [ER-]: Secondary | ICD-10-CM

## 2023-10-17 LAB — CBC WITH DIFFERENTIAL (CANCER CENTER ONLY)
Abs Immature Granulocytes: 0.03 K/uL (ref 0.00–0.07)
Basophils Absolute: 0.1 K/uL (ref 0.0–0.1)
Basophils Relative: 1 %
Eosinophils Absolute: 0.4 K/uL (ref 0.0–0.5)
Eosinophils Relative: 3 %
HCT: 34.8 % — ABNORMAL LOW (ref 36.0–46.0)
Hemoglobin: 11.4 g/dL — ABNORMAL LOW (ref 12.0–15.0)
Immature Granulocytes: 0 %
Lymphocytes Relative: 18 %
Lymphs Abs: 2.1 K/uL (ref 0.7–4.0)
MCH: 28.9 pg (ref 26.0–34.0)
MCHC: 32.8 g/dL (ref 30.0–36.0)
MCV: 88.3 fL (ref 80.0–100.0)
Monocytes Absolute: 0.7 K/uL (ref 0.1–1.0)
Monocytes Relative: 6 %
Neutro Abs: 8.5 K/uL — ABNORMAL HIGH (ref 1.7–7.7)
Neutrophils Relative %: 72 %
Platelet Count: 309 K/uL (ref 150–400)
RBC: 3.94 MIL/uL (ref 3.87–5.11)
RDW: 16.4 % — ABNORMAL HIGH (ref 11.5–15.5)
WBC Count: 11.7 K/uL — ABNORMAL HIGH (ref 4.0–10.5)
nRBC: 0 % (ref 0.0–0.2)

## 2023-10-17 LAB — CMP (CANCER CENTER ONLY)
ALT: 7 U/L (ref 0–44)
AST: 12 U/L — ABNORMAL LOW (ref 15–41)
Albumin: 3.7 g/dL (ref 3.5–5.0)
Alkaline Phosphatase: 153 U/L — ABNORMAL HIGH (ref 38–126)
Anion gap: 5 (ref 5–15)
BUN: 10 mg/dL (ref 6–20)
CO2: 28 mmol/L (ref 22–32)
Calcium: 8.8 mg/dL — ABNORMAL LOW (ref 8.9–10.3)
Chloride: 105 mmol/L (ref 98–111)
Creatinine: 0.87 mg/dL (ref 0.44–1.00)
GFR, Estimated: >60 mL/min (ref >60)
Glucose, Bld: 86 mg/dL (ref 70–99)
Potassium: 4.2 mmol/L (ref 3.5–5.1)
Sodium: 138 mmol/L (ref 135–145)
Total Bilirubin: 0.2 mg/dL (ref 0.0–1.2)
Total Protein: 6.8 g/dL (ref 6.5–8.1)

## 2023-10-17 MED ORDER — SODIUM CHLORIDE 0.9 % IV SOLN: INTRAVENOUS | Status: DC

## 2023-10-17 MED ORDER — SODIUM CHLORIDE 0.9% FLUSH
10.0000 mL | INTRAVENOUS | Status: DC | PRN
  Administered 2023-10-17: 10 mL

## 2023-10-17 MED ORDER — DIPHENHYDRAMINE HCL 25 MG PO CAPS
50.0000 mg | ORAL_CAPSULE | Freq: Once | ORAL | Status: AC
  Administered 2023-10-17: 50 mg via ORAL
  Filled 2023-10-17: qty 2

## 2023-10-17 MED ORDER — AMOXICILLIN-POT CLAVULANATE 875-125 MG PO TABS
1.0000 | ORAL_TABLET | Freq: Two times a day (BID) | ORAL | 0 refills | Status: DC
  Filled 2023-10-17: qty 14, 7d supply, fill #0

## 2023-10-17 MED ORDER — TRASTUZUMAB-ANNS CHEMO 150 MG IV SOLR
6.0000 mg/kg | Freq: Once | INTRAVENOUS | Status: AC
  Administered 2023-10-17: 483 mg via INTRAVENOUS
  Filled 2023-10-17: qty 23

## 2023-10-17 MED ORDER — ACETAMINOPHEN 325 MG PO TABS
650.0000 mg | ORAL_TABLET | Freq: Once | ORAL | Status: AC
  Administered 2023-10-17: 650 mg via ORAL
  Filled 2023-10-17: qty 2

## 2023-10-17 NOTE — Progress Notes (Signed)
 [facility] Cancer Follow up:    Pcp, No No address on file   DIAGNOSIS: Cancer Staging  Malignant neoplasm of upper-outer quadrant of left breast in female, estrogen receptor negative (HCC) Staging form: Breast, AJCC 8th Edition - Clinical: Stage IA (cT1c, cN0, cM0, G2, ER-, PR-, HER2+) - Signed by Odean Potts, MD on 06/07/2023 Stage prefix: Initial diagnosis Histologic grading system: 3 grade system - Pathologic stage from 06/29/2023: Stage IA (pT1c, pN0, cM0, G2, ER-, PR-, HER2+) - Signed by Crawford Morna Pickle, NP on 09/11/2023 Stage prefix: Initial diagnosis Histologic grading system: 3 grade system    SUMMARY OF ONCOLOGIC HISTORY: Oncology History  Malignant neoplasm of upper-outer quadrant of left breast in female, estrogen receptor negative (HCC)  05/31/2023 Initial Diagnosis   Screening mammogram detected left breast mass and asymmetry retroareolar region, irregular spiculated mass 1.1 cm, axilla negative, ultrasound measured 1.2 cm, biopsy: Grade 2 IDC ER 0% PR 0% Ki67 15%, HER2 3+ positive   06/07/2023 Cancer Staging   Staging form: Breast, AJCC 8th Edition - Clinical: Stage IA (cT1c, cN0, cM0, G2, ER-, PR-, HER2+) - Signed by Odean Potts, MD on 06/07/2023 Stage prefix: Initial diagnosis Histologic grading system: 3 grade system   06/29/2023 Surgery   Left lumpectomy: 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, margins negative, 0/3 lymph nodes negative, ER 0%, PR 0%, HER2 3+ positive, Ki-67 15%    06/29/2023 Cancer Staging   Staging form: Breast, AJCC 8th Edition - Pathologic stage from 06/29/2023: Stage IA (pT1c, pN0, cM0, G2, ER-, PR-, HER2+) - Signed by Crawford Morna Pickle, NP on 09/11/2023 Stage prefix: Initial diagnosis Histologic grading system: 3 grade system   08/07/2023 -  Chemotherapy   Patient is on Treatment Plan : BREAST Paclitaxel  + Trastuzumab  q7d / Trastuzumab  q21d       CURRENT THERAPY: Herceptin   INTERVAL  HISTORY:  Discussed the use of AI scribe software for clinical note transcription with the patient, who gave verbal consent to proceed.  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  She experiences redness and swelling in the right lower extremity, which is sore to touch but not painful while walking. The swelling can become painful when severe. A recent Doppler study was negative. She was taken off amlodipine due to its potential to cause swelling and was switched to Cardizem after losartan was ineffective. She is currently waiting to receive more Cardizem after running out following a 14-day trial.  She has arterial disease in her bilateral lower extremities. She performs ankle motion exercises and uses a ball under her foot to manage stiffness.     Patient Active Problem List   Diagnosis Date Noted   Port-A-Cath in place 08/07/2023   Severe obstructive sleep apnea 06/19/2023   Antiplatelet or antithrombotic long-term use 06/15/2023   Positive colorectal cancer screening using Cologuard test 06/15/2023   Malignant neoplasm of upper-outer quadrant of left breast in female, estrogen receptor negative (HCC) 06/05/2023   Excessive daytime sleepiness 12/22/2022   DOE (dyspnea on exertion) 12/22/2022   Centrilobular emphysema (HCC) 12/22/2022   Claudication in peripheral vascular disease 10/04/2018   Essential hypertension 10/04/2018   Hyperlipidemia 10/04/2018   Tobacco abuse 10/04/2018   Family history of heart disease 10/04/2018    is allergic to codeine, other, sulfonamide derivatives, and lisinopril.  MEDICAL HISTORY:  Past Medical History:  Diagnosis Date   Anxiety    Arthritis    Breast cancer (HCC)    left   Chronic back pain    COPD (chronic obstructive pulmonary disease) (HCC)    Diabetes mellitus     Difficult intubation    Diverticulitis    Dyspnea    with exertion   Fibromyalgia    Hyperlipidemia    Hypertension    PAD (peripheral artery disease)    a. PV angio 10/2018 - bilateral SFA disease s/p L intervention.   PAD (peripheral artery disease)    PONV (postoperative nausea and vomiting)    Sleep apnea    Tobacco abuse     SURGICAL HISTORY: Past Surgical History:  Procedure Laterality Date   ABDOMINAL AORTOGRAM W/LOWER EXTREMITY Right 10/18/2018   Procedure: ABDOMINAL AORTOGRAM W/LOWER EXTREMITY;  Surgeon: Court Dorn PARAS, MD;  Location: [facility];  Service: Cardiovascular;  Laterality: Right;   ABDOMINAL AORTOGRAM W/LOWER EXTREMITY N/A 04/02/2020   Procedure: ABDOMINAL AORTOGRAM W/LOWER EXTREMITY;  Surgeon: Court Dorn PARAS, MD;  Location: [facility];  Service: Cardiovascular;  Laterality: N/A;   ABDOMINAL AORTOGRAM W/LOWER EXTREMITY N/A 04/23/2020   Procedure: ABDOMINAL AORTOGRAM W/LOWER EXTREMITY;  Surgeon: Court Dorn PARAS, MD;  Location: [facility];  Service: Cardiovascular;  Laterality: N/A;   ABDOMINAL HYSTERECTOMY     BREAST BIOPSY Left 05/31/2023   US  LT BREAST BX W LOC DEV 1ST LESION IMG BX SPEC US  GUIDE 05/31/2023 [facility]   BREAST BIOPSY  06/27/2023   MM LT RADIOACTIVE SEED LOC MAMMO GUIDE 06/27/2023 [facility]   BREAST LUMPECTOMY WITH RADIOACTIVE SEED AND SENTINEL LYMPH NODE BIOPSY Left 06/29/2023   Procedure: BREAST LUMPECTOMY WITH RADIOACTIVE SEED AND SENTINEL LYMPH NODE BIOPSY;  Surgeon: Curvin Deward MOULD, MD;  Location: [facility];  Service: General;  Laterality: Left;  LEFT BREAST RADIOACTIVE SEED LOCALIZED LUMPECTOMY   SENTINEL NODE BIOPSY   CHOLECYSTECTOMY     PERIPHERAL VASCULAR ATHERECTOMY Left 10/18/2018   Procedure: PERIPHERAL VASCULAR ATHERECTOMY;  Surgeon: Court Dorn PARAS, MD;  [facility]: [facility];  Service: Cardiovascular;  Laterality: Left;  SFA   PERIPHERAL VASCULAR ATHERECTOMY Right 11/22/2018    Procedure: PERIPHERAL VASCULAR ATHERECTOMY;  Surgeon: Court Dorn PARAS, MD;  Location: [facility] INVASIVE CV LAB;  Service: Cardiovascular;  Laterality: Right;  SFA   PERIPHERAL VASCULAR ATHERECTOMY  04/23/2020   Procedure: PERIPHERAL VASCULAR ATHERECTOMY;  Surgeon: Court Dorn PARAS, MD;  Location: [facility] IN[facility]ASIVE CV LAB;  Service: Cardiovascular;;   PERIPHERAL VASCULAR BALLOON ANGIOPLASTY Right 11/22/2018   Procedure: PERIPHERAL VASCULAR BALLOON ANGIOPLASTY;  Surgeon: Court Dorn PARAS, MD;  [facility]: [facility];  Service: Cardiovascular;  Laterality: Right;  SFA   PERIPHERAL VASCULAR BALLOON ANGIOPLASTY  04/23/2020   Procedure: PERIPHERAL VASCULAR BALLOON ANGIOPLASTY;  Surgeon: Court Dorn PARAS, MD;  [facility]: [facility];  Service: Cardiovascular;;   PERIPHERAL VASCULAR INTERVENTION Left 04/02/2020   Procedure: PERIPHERAL VASCULAR INTERVENTION;  Surgeon: Court Dorn PARAS, MD;  [facility]: [facility];  Service: Cardiovascular;  Laterality: Left;   PORTACATH PLACEMENT N/A 06/29/2023   Procedure: INSERTION, TUNNELED CENTRAL VENOUS DEVICE, WITH PORT;  Surgeon: Curvin Deward MOULD, MD;  Location: [facility];  Service: General;  Laterality: N/A;  PORT PLACEMENT WITH ULTRASOUND GUIDANCE GEN w/PEC BLOCK    SOCIAL HISTORY: Social History   Socioeconomic History   Marital status: Legally Separated    Spouse name: Not on file   Number of children:  2   Years of education: Not on file   Highest education level: Not on file  Occupational History   Occupation: disabled  Tobacco Use   Smoking status: Some Days    Current packs/day: 0.00    Types: Cigarettes    Last attempt to quit: 08/11/2022    Years since quitting: 1.1   Smokeless tobacco: Never   Tobacco comments:    Trying quit with patch  Vaping Use   Vaping status: Never Used  Substance and Sexual Activity   Alcohol use: No   Drug use: No   Sexual activity: Yes    Birth control/protection: Surgical  Other Topics Concern   Not on file   Social History Narrative   Not on file   Social Drivers of Health   Financial Resource Strain: Not on file  Food Insecurity: Food Insecurity Present (09/26/2023)   Hunger Vital Sign    Worried About Running Out of Food in the Last Year: Sometimes true    Ran Out of Food in the Last Year: Never true  Transportation Needs: No Transportation Needs (06/07/2023)   PRAPARE - Administrator, Civil Service (Medical): No    Lack of Transportation (Non-Medical): No  Physical Activity: Not on file  Stress: Not on file  Social Connections: Not on file  Intimate Partner Violence: Not At Risk (06/07/2023)   Humiliation, Afraid, Rape, and Kick questionnaire    Fear of Current or Ex-Partner: No    Emotionally Abused: No    Physically Abused: No    Sexually Abused: No    FAMILY HISTORY: Family History  Problem Relation Age of Onset   Diabetes Mother    Hypertension Mother    Hyperlipidemia Mother    Heart attack Father    Heart disease Father    Hypertension Father    Hyperlipidemia Father    Breast cancer Neg Hx    Colon cancer Neg Hx    Esophageal cancer Neg Hx     Review of Systems  Constitutional:  Negative for appetite change, chills, fatigue, fever and unexpected weight change.  HENT:   Negative for hearing loss, lump/mass, mouth sores and trouble swallowing.   Eyes:  Negative for eye problems and icterus.  Respiratory:  Negative for chest tightness, cough and shortness of breath.   Cardiovascular:  Negative for chest pain, leg swelling and palpitations.  Gastrointestinal:  Negative for abdominal distention, abdominal pain, constipation, diarrhea, nausea and vomiting.  Endocrine: Negative for hot flashes.  Genitourinary:  Negative for difficulty urinating.   Musculoskeletal:  Negative for arthralgias.  Skin:  Negative for itching and rash.  Neurological:  Positive for numbness. Negative for dizziness, extremity weakness and headaches.  Hematological:  Negative for  adenopathy. Does not bruise/bleed easily.  Psychiatric/Behavioral:  Negative for depression. The patient is not nervous/anxious.       PHYSICAL EXAMINATION   Onc Performance Status - 10/17/23 0937       ECOG Perf Status   ECOG Perf Status Ambulatory and capable of all selfcare but unable to carry out any work activities.  Up and about more than 50% of waking hours      KPS SCALE   KPS % SCORE Able to carry on normal activity, minor s/s of disease          Vitals:   10/17/23 0929  BP: (!) 178/65  Pulse: 81  Resp: 18  Temp: 98 F (36.7 C)  SpO2: 98%    Physical  Exam Constitutional:      General: She is not in acute distress.    Appearance: Normal appearance. She is not toxic-appearing.  HENT:     Head: Normocephalic and atraumatic.     Mouth/Throat:     Mouth: Mucous membranes are moist.     Pharynx: Oropharynx is clear. No oropharyngeal exudate or posterior oropharyngeal erythema.  Eyes:     General: No scleral icterus. Cardiovascular:     Rate and Rhythm: Normal rate and regular rhythm.     Pulses: Normal pulses.     Heart sounds: Normal heart sounds.  Pulmonary:     Effort: Pulmonary effort is normal.     Breath sounds: Normal breath sounds.  Abdominal:     General: Abdomen is flat. Bowel sounds are normal. There is no distension.     Palpations: Abdomen is soft.     Tenderness: There is no abdominal tenderness.  Musculoskeletal:        General: No swelling.     Cervical back: Neck supple.     Comments: Right lower leg is erythematous, swollen, slightly tender, and warm to touch beginning at midway of calf and extending down foot.  She has no open wounds, she has good cap refill noted, DP pulse is 1+.    Lymphadenopathy:     Cervical: No cervical adenopathy.  Skin:    General: Skin is warm and dry.     Findings: No rash.  Neurological:     General: No focal deficit present.     Mental Status: She is alert.  Psychiatric:        Mood and Affect: Mood  normal.        Behavior: Behavior normal.     LABORATORY DATA:  CBC    Component Value Date/Time   WBC 9.7 09/26/2023 0919   WBC 5.8 09/17/2023 1755   RBC 3.28 (L) 09/26/2023 0919   HGB 9.7 (L) 09/26/2023 0919   HGB 12.0 04/15/2020 1537   HCT 29.0 (L) 09/26/2023 0919   HCT 36.5 04/15/2020 1537   PLT 362 09/26/2023 0919   PLT 323 04/15/2020 1537   MCV 88.4 09/26/2023 0919   MCV 87 04/15/2020 1537   MCH 29.6 09/26/2023 0919   MCHC 33.4 09/26/2023 0919   RDW 16.7 (H) 09/26/2023 0919   RDW 14.4 04/15/2020 1537   LYMPHSABS 1.7 09/26/2023 0919   LYMPHSABS 2.5 10/25/2018 1552   MONOABS 0.7 09/26/2023 0919   EOSABS 0.1 09/26/2023 0919   EOSABS 0.2 10/25/2018 1552   BASOSABS 0.1 09/26/2023 0919   BASOSABS 0.1 10/25/2018 1552    CMP     Component Value Date/Time   NA 142 09/26/2023 0919   NA 139 04/15/2020 1537   K 3.8 09/26/2023 0919   CL 107 09/26/2023 0919   CO2 29 09/26/2023 0919   GLUCOSE 108 (H) 09/26/2023 0919   BUN 6 09/26/2023 0919   BUN 9 04/15/2020 1537   CREATININE 0.84 09/26/2023 0919   CALCIUM  7.4 (L) 09/26/2023 0919   PROT 6.1 (L) 09/26/2023 0919   PROT 7.0 03/11/2020 1005   ALBUMIN 3.2 (L) 09/26/2023 0919   ALBUMIN 4.3 03/11/2020 1005   AST 11 (L) 09/26/2023 0919   ALT 9 09/26/2023 0919   ALKPHOS 135 (H) 09/26/2023 0919   BILITOT 0.2 09/26/2023 0919   GFRNONAA >60 09/26/2023 0919   GFRAA 73 03/11/2020 1005     ASSESSMENT and THERAPY PLAN:   Assessment and Plan Assessment & Plan Stage 1A  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 Echocardiogram suitable for Herceptin  treatment. - Administer Herceptin  as scheduled. - Follow up with Dr. Izell on October 24, 2023 to discuss adjuvant radiation therapy.  Right lower extremity cellulitis and localized edema Presentation is clinically consistent with cellulitis..   - Prescribe Augmentin  BID x 10 days - Instruct  to call if symptoms do not improve by the end of the week. - Consult Dr. Court if needed in future.   Peripheral arterial disease of right lower extremity No signs of ischemia, adequate blood flow throughout.   Hypertension Hypertension management adjusted by primary care provider, currently on Cardizem. - Continue Cardizem as prescribed. - Monitor blood pressure. - Consider reaching out to Dr. Court if HTN remains uncontrolled.  RTC in 3 weeks for labs, f/u, and next treatment.    All questions were answered. The patient knows to call the clinic with any problems, questions or concerns. We can certainly see the patient much sooner if necessary.  Total encounter time:30 minutes*in face-to-face visit time, chart review, lab review, care coordination, order entry, and documentation of the encounter time.    Morna Kendall, NP 10/17/23 9:53 AM Medical Oncology and Hematology [facility] [address] [place], [place] [address] Tel. [phone]    Fax. [phone]  *Total Encounter Time as defined by the Centers for Medicare and Medicaid Services includes, in addition to the face-to-face time of a patient visit (documented in the note above) non-face-to-face time: obtaining and reviewing outside history, ordering and reviewing medications, tests or procedures, care coordination (communications with other health care professionals or caregivers) and documentation in the medical record.

## 2023-10-17 NOTE — Patient Instructions (Signed)
 [facility] - A DEPT OF [facility]   Discharge Instructions: Thank you for choosing [facility] to provide your oncology and hematology care.   If you have a lab appointment with the Cancer Center, please go directly to the Cancer Center and check in at the registration area.   Wear comfortable clothing and clothing appropriate for easy access to any Portacath or PICC line.   We strive to give you quality time with your provider. You may need to reschedule your appointment if you arrive late (15 or more minutes).  Arriving late affects you and other patients whose appointments are after yours.  Also, if you miss three or more appointments without notifying the office, you may be dismissed from the clinic at the provider's discretion.      For prescription refill requests, have your pharmacy contact our office and allow 72 hours for refills to be completed.    Today you received the following chemotherapy and/or immunotherapy agents: Trastuzumab (Kanjinti)      To help prevent nausea and vomiting after your treatment, we encourage you to take your nausea medication as directed.  BELOW ARE SYMPTOMS THAT SHOULD BE REPORTED IMMEDIATELY: *FEVER GREATER THAN 100.4 F (38 C) OR HIGHER *CHILLS OR SWEATING *NAUSEA AND VOMITING THAT IS NOT CONTROLLED WITH YOUR NAUSEA MEDICATION *UNUSUAL SHORTNESS OF BREATH *UNUSUAL BRUISING OR BLEEDING *URINARY PROBLEMS (pain or burning when urinating, or frequent urination) *BOWEL PROBLEMS (unusual diarrhea, constipation, pain near the anus) TENDERNESS IN MOUTH AND THROAT WITH OR WITHOUT PRESENCE OF ULCERS (sore throat, sores in mouth, or a toothache) UNUSUAL RASH, SWELLING OR PAIN  UNUSUAL VAGINAL DISCHARGE OR ITCHING   Items with * indicate a potential emergency and should be followed up as soon as possible or go to the Emergency Department if any problems should occur.  Please show the CHEMOTHERAPY ALERT CARD or  IMMUNOTHERAPY ALERT CARD at check-in to the Emergency Department and triage nurse.  Should you have questions after your visit or need to cancel or reschedule your appointment, please contact [facility] - A DEPT OF [facility]  Dept: [phone]  and follow the prompts.  Office hours are 8:00 a.m. to 4:30 p.m. Monday - Friday. Please note that voicemails left after 4:00 p.m. may not be returned until the following business day.  We are closed weekends and major holidays. You have access to a nurse at all times for urgent questions. Please call the main number to the clinic Dept: [phone] and follow the prompts.   For any non-urgent questions, you may also contact your provider using MyChart. We now offer e-Visits for anyone 33 and older to request care online for non-urgent symptoms. For details visit [email or web address].   Also download the MyChart app! Go to the app store, search "MyChart", open the app, select [facility], and log in with your MyChart username and password.

## 2023-10-20 ENCOUNTER — Other Ambulatory Visit (HOSPITAL_COMMUNITY): Payer: Self-pay

## 2023-10-20 NOTE — Progress Notes (Addendum)
 Radiation Oncology         [phone] ________________________________  Name: Mary Campbell MRN: [id number]  Date: 10/24/2023  DOB: May 14, 1965  Re-Evaluation Note  CC: Pcp, No  Odean Potts, MD    ICD-10-CM   1. Malignant neoplasm of upper-outer quadrant of left breast in female, estrogen receptor negative (HCC)  C50.412    Z17.1         Cancer Staging  Malignant neoplasm of upper-outer quadrant of left breast in female, estrogen receptor negative (HCC) Staging form: Breast, AJCC 8th Edition - Clinical: Stage IA (cT1c, cN0, cM0, G2, ER-, PR-, HER2+) - Signed by Odean Potts, MD on 06/07/2023 Stage prefix: Initial diagnosis Histologic grading system: 3 grade system - Pathologic stage from 06/29/2023: Stage IA (pT1c, pN0, cM0, G2, ER-, PR-, HER2+) - Signed by Crawford Morna Pickle, NP on 09/11/2023 Stage prefix: Initial diagnosis Histologic grading system: 3 grade system   Diagnosis:   Stage IA (pT1c, N0, M0) 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 She was seen in the multidisciplinary breast clinic on 06/07/2023  She opted to proceed with lumpectomy with nodal biopsy on 06/29/2023. Pathology from the procedure revealed: Intermediate grade Invasive ductal carcinoma, measuring 1.6 cm in greatest dimension with intermediate grade DCIS. All margins were negative and were > 10 mm from all margins. 3/3 sampled lymph nodes were negative for carcinoma.   She has been treated with Taxol  and trastuzumab  from 08/07/2023 under Dr. Gudena. Her last Taxol  infusion was on 09/11/2023. She is currently receiving trastuzumab  q3w. She was last seen by medical oncology on 10/17/2022. At that time she was experiencing redness and swelling in the right lower extremity. She was given Augmentin  for suspected cellulitis and proceeding with Herceptin  as scheduled.   On review of  systems, the patient reports to be doing well overall. She notes residual redness in her right calf with some associated ankle pain. She also notes residual neuropathy. This has been present since her chemotherapy. She denies breast pain, swelling, or issues with left shoulder ROM.    Allergies:  is allergic to codeine, other, sulfonamide derivatives, and lisinopril.  Meds: Current Outpatient Medications  Medication Sig Dispense Refill   acetaminophen  (TYLENOL ) 650 MG CR tablet Take 1,300 mg by mouth in the morning, at noon, and at bedtime.     amoxicillin -clavulanate (AUGMENTIN ) 875-125 MG tablet Take 1 tablet by mouth 2 (two) times daily. 14 tablet 0   aspirin  EC 81 MG tablet Take 81 mg by mouth in the morning.     buPROPion (WELLBUTRIN XL) 150 MG 24 hr tablet Take 150 mg by mouth in the morning.     CARDIZEM 60 MG tablet Take 60 mg by mouth daily.     clobetasol cream (TEMOVATE) 0.05 % Apply 1 Application topically 2 (two) times daily as needed (skin irritation).     clopidogrel  (PLAVIX ) 75 MG tablet Take 1 tablet (75 mg total) by mouth daily. 90 tablet 1   cyanocobalamin  (VITAMIN B12) 500 MCG tablet Take 2 tablets (1,000 mcg total) by mouth daily. 60 tablet 3   cyclobenzaprine (FLEXERIL) 5 MG tablet Take 5 mg by mouth 3 (three) times daily.     DULoxetine (CYMBALTA) 30 MG capsule Take 30 mg by mouth in the morning. Total dose 90 mg (30 mg and 60 mg)     DULoxetine (CYMBALTA) 60 MG capsule Take 60  mg by mouth in the morning. Total dose 90 mg (30 mg and 60 mg)     ferrous gluconate  (FERGON) 324 MG tablet Take 1 tablet (324 mg total) by mouth daily with breakfast. 30 tablet 3   fluticasone  (FLONASE ) 50 MCG/ACT nasal spray Place 1-2 sprays into both nostrils daily. 18.2 mL 2   furosemide  (LASIX ) 20 MG tablet Take 1 tab daily for 3 days, then take daily as needed after that (do not take more than 3 days in a row). 30 tablet 0   gabapentin  (NEURONTIN ) 100 MG capsule Take 300 mg by mouth 2 (two)  times daily.     JANUMET XR 50-1000 MG TB24 Take 1 tablet by mouth in the morning and at bedtime.     LANTUS  SOLOSTAR 100 UNIT/ML Solostar Pen Inject 50 Units into the skin at bedtime.     lidocaine -prilocaine  (EMLA ) cream Apply to affected area once 30 g 3   losartan (COZAAR) 50 MG tablet Take 50 mg by mouth daily.     ondansetron  (ZOFRAN ) 8 MG tablet TAKE 1 TABLET EVERY 8 HOURS AS NEEDED FOR NAUSEA OR VOMITING 30 tablet 35   ondansetron  (ZOFRAN -ODT) 4 MG disintegrating tablet Take 1 tablet (4 mg total) by mouth every 8 (eight) hours as needed for nausea. 10 tablet 0   potassium chloride  (KLOR-CON ) 10 MEQ tablet Take 1 tablet (10 mEq total) by mouth 2 (two) times daily. 28 tablet 0   prochlorperazine  (COMPAZINE ) 10 MG tablet TAKE 1 TABLET EVERY 6 HOURS AS NEEDED FOR NAUSEA OR VOMITING 30 tablet 44   rosuvastatin  (CRESTOR ) 40 MG tablet Take 1 tablet (40 mg total) by mouth daily. 90 tablet 3   Tiotropium Bromide-Olodaterol (STIOLTO RESPIMAT ) 2.5-2.5 MCG/ACT AERS Inhale 2 puffs into the lungs daily. 4 g 11   traMADol  (ULTRAM ) 50 MG tablet Take 50 mg by mouth every 6 (six) hours as needed.     triamcinolone  (KENALOG ) 0.025 % cream Apply 1 Application topically 2 (two) times daily. 30 g 0   VENTOLIN  HFA 108 (90 Base) MCG/ACT inhaler USE 2 INHALATIONS EVERY 6 HOURS AS NEEDED FOR WHEEZING OR SHORTNESS OF BREATH 54 g 3   Current Facility-Administered Medications  Medication Dose Route Frequency Provider Last Rate Last Admin   sodium chloride  flush (NS) 0.9 % injection 3 mL  3 mL Intravenous Q12H Court Dorn PARAS, MD       sodium chloride  flush (NS) 0.9 % injection 3 mL  3 mL Intravenous Q12H Court Dorn PARAS, MD        Physical Findings: The patient is in no acute distress. Patient is alert and oriented.  height is 5' 4 (1.626 m) and weight is 161 lb 3.2 oz (73.1 kg). Her temperature is 97.3 F (36.3 C) (abnormal). Her blood pressure is 159/71 (abnormal) and her pulse is 86. Her respiration is 20  and oxygen saturation is 100%.  No significant changes. Lungs are clear to auscultation bilaterally. Heart has regular rate and rhythm. No palpable cervical, supraclavicular, or axillary adenopathy. Abdomen soft, non-tender, normal bowel sounds. MSK: erythematous right calf and right digits with no obvious swelling, consistent with baseline since her chemotherapy.   Right Breast: no palpable mass, nipple discharge or bleeding. Left Breast: Well healed lumpectomy and lymph node incisions. Skin edges are well approximated. Small seroma palpated beneath lymph node incision with no associated tenderness.  Full ROM in bilateral shoulders.    Lab Findings: Lab Results  Component Value Date   WBC 11.7 (  H) 10/17/2023   HGB 11.4 (L) 10/17/2023   HCT 34.8 (L) 10/17/2023   MCV 88.3 10/17/2023   PLT 309 10/17/2023    Radiographic Findings: ECHOCARDIOGRAM COMPLETE Result Date: 09/28/2023    ECHOCARDIOGRAM REPORT   Patient Name:   Mary Campbell Date of Exam: 09/28/2023 Medical Rec #:  [id number]     Height:       64.0 in Accession #:    [id number]    Weight:       176.6 lb Date of Birth:  Nov 17, 1965     BSA:          1.856 m Patient Age:    58 years      BP:           160/60 mmHg Patient Gender: F             HR:           95 bpm. Exam Location:  Outpatient Procedure: 2D Echo and Strain Analysis (Both Spectral and Color Flow Doppler            were utilized during procedure). Indications:    Chemotherapy  History:        Patient has prior history of Echocardiogram examinations.                 Signs/Symptoms:Dyspnea; Risk Factors:Hypertension.  Sonographer:    Charmaine Gaskins Referring Phys: 37 LINDSEY CORNETTO CAUSEY IMPRESSIONS  1. Left ventricular ejection fraction, by estimation, is 70 to 75%. Left ventricular ejection fraction by 2D MOD biplane is 71.5 %. The left ventricle has hyperdynamic function. The left ventricle has no regional wall motion abnormalities. Left ventricular diastolic parameters are  consistent with Grade I diastolic dysfunction (impaired relaxation). The average left ventricular global longitudinal strain is -16.7 %.  2. Right ventricular systolic function is normal. The right ventricular size is normal.  3. The mitral valve is grossly normal. Trivial mitral valve regurgitation.  4. The aortic valve is tricuspid. Aortic valve regurgitation is not visualized.  5. The inferior vena cava is normal in size with greater than 50% respiratory variability, suggesting right atrial pressure of 3 mmHg. Comparison(s): Changes from prior study are noted. 07/03/2023: LVEF 70-75%, GLS -20.2%. FINDINGS  Left Ventricle: Left ventricular ejection fraction, by estimation, is 70 to 75%. Left ventricular ejection fraction by 2D MOD biplane is 71.5 %. The left ventricle has hyperdynamic function. The left ventricle has no regional wall motion abnormalities. The average left ventricular global longitudinal strain is -16.7 %. Strain was performed and the global longitudinal strain is indeterminate. The left ventricular internal cavity size was normal in size. There is no left ventricular hypertrophy. Left ventricular diastolic parameters are consistent with Grade I diastolic dysfunction (impaired relaxation). Indeterminate filling pressures. Right Ventricle: The right ventricular size is normal. No increase in right ventricular wall thickness. Right ventricular systolic function is normal. Left Atrium: Left atrial size was normal in size. Right Atrium: Right atrial size was normal in size. Pericardium: There is no evidence of pericardial effusion. Mitral Valve: The mitral valve is grossly normal. Trivial mitral valve regurgitation. Tricuspid Valve: The tricuspid valve is grossly normal. Tricuspid valve regurgitation is trivial. Aortic Valve: The aortic valve is tricuspid. Aortic valve regurgitation is not visualized. Aortic valve mean gradient measures 8.0 mmHg. Aortic valve peak gradient measures 18.0 mmHg. Aortic  valve area, by VTI measures 2.21 cm. Pulmonic Valve: The pulmonic valve was normal in structure. Pulmonic valve regurgitation is not visualized. Aorta: The aortic  root and ascending aorta are structurally normal, with no evidence of dilitation. Venous: The inferior vena cava is normal in size with greater than 50% respiratory variability, suggesting right atrial pressure of 3 mmHg. IAS/Shunts: No atrial level shunt detected by color flow Doppler.  LEFT VENTRICLE PLAX 2D                        Biplane EF (MOD) LVIDd:         5.50 cm         LV Biplane EF:   Left LVIDs:         4.10 cm                          ventricular LV PW:         0.90 cm                          ejection LV IVS:        0.80 cm                          fraction by LVOT diam:     2.10 cm                          2D MOD LV SV:         78                               biplane is LV SV Index:   42                               71.5 %. LVOT Area:     3.46 cm                                Diastology                                LV e' medial:    8.81 cm/s LV Volumes (MOD)               LV E/e' medial:  10.5 LV vol d, MOD    57.5 ml       LV e' lateral:   7.96 cm/s A2C:                           LV E/e' lateral: 11.6 LV vol d, MOD    72.1 ml A4C:                           2D Longitudinal LV vol s, MOD    14.3 ml       Strain A2C:                           2D Strain GLS   -17.1 % LV vol s, MOD    20.9 ml       (A4C): A4C:  2D Strain GLS   -12.9 % LV SV MOD A2C:   43.2 ml       (A3C): LV SV MOD A4C:   72.1 ml       2D Strain GLS   -20.1 % LV SV MOD BP:    46.2 ml       (A2C):                                2D Strain GLS   -16.7 %                                Avg: RIGHT VENTRICLE RV Basal diam:  2.90 cm RV Mid diam:    2.40 cm RV S prime:     15.30 cm/s LEFT ATRIUM             Index        RIGHT ATRIUM           Index LA diam:        3.90 cm 2.10 cm/m   RA Area:     16.60 cm LA Vol (A2C):   46.6 ml 25.11 ml/m  RA Volume:    40.30 ml  21.72 ml/m LA Vol (A4C):   52.2 ml 28.13 ml/m LA Biplane Vol: 50.2 ml 27.05 ml/m  AORTIC VALVE AV Area (Vmax):    2.11 cm AV Area (Vmean):   2.42 cm AV Area (VTI):     2.21 cm AV Vmax:           212.00 cm/s AV Vmean:          124.000 cm/s AV VTI:            0.354 m AV Peak Grad:      18.0 mmHg AV Mean Grad:      8.0 mmHg LVOT Vmax:         129.00 cm/s LVOT Vmean:        86.800 cm/s LVOT VTI:          0.226 m LVOT/AV VTI ratio: 0.64  AORTA Ao Root diam: 2.70 cm Ao Asc diam:  3.40 cm MITRAL VALVE MV Area (PHT): 5.34 cm     SHUNTS MV Decel Time: 142 msec     Systemic VTI:  0.23 m MV E velocity: 92.40 cm/s   Systemic Diam: 2.10 cm MV A velocity: 126.00 cm/s MV E/A ratio:  0.73 Vinie Maxcy MD Electronically signed by Vinie Maxcy MD Signature Date/Time: 09/28/2023/4:08:54 PM    Final    VAS US  LOWER EXTREMITY VENOUS (DVT) Result Date: 09/26/2023  Lower Venous DVT Study Patient Name:  Mary Campbell  Date of Exam:   09/26/2023 Medical Rec #: [id number]      Accession #:    [id number] Date of Birth: 20-Dec-1965      Patient Gender: F Patient Age:   24 years Exam Location:  [address] Procedure:      VAS US  LOWER EXTREMITY VENOUS (DVT) Referring Phys: MORNA CAUSEY --------------------------------------------------------------------------------  Indications: Swelling.  Performing Technologist: Duwaine Hives RVS  Examination Guidelines: A complete evaluation includes B-mode imaging, spectral Doppler, color Doppler, and power Doppler as needed of all accessible portions of each vessel. Bilateral testing is considered an integral part of a complete examination. Limited examinations for reoccurring indications may be performed as noted. The reflux portion of the exam is  performed with the patient in reverse Trendelenburg.  +---------+---------------+---------+-----------+----------+--------------+ RIGHT    CompressibilityPhasicitySpontaneityPropertiesThrombus Aging  +---------+---------------+---------+-----------+----------+--------------+ CFV      Full           Yes      Yes                                 +---------+---------------+---------+-----------+----------+--------------+ SFJ      Full                                                        +---------+---------------+---------+-----------+----------+--------------+ FV Prox  Full                                                        +---------+---------------+---------+-----------+----------+--------------+ FV Mid   Full                                                        +---------+---------------+---------+-----------+----------+--------------+ FV DistalFull                                                        +---------+---------------+---------+-----------+----------+--------------+ PFV      Full                                                        +---------+---------------+---------+-----------+----------+--------------+ POP      Full           Yes      Yes                                 +---------+---------------+---------+-----------+----------+--------------+ PTV      Full                                                        +---------+---------------+---------+-----------+----------+--------------+ PERO     Full           Yes      Yes                                 +---------+---------------+---------+-----------+----------+--------------+   +----+---------------+---------+-----------+----------+--------------+ LEFTCompressibilityPhasicitySpontaneityPropertiesThrombus Aging +----+---------------+---------+-----------+----------+--------------+ CFV Full           Yes      Yes                                 +----+---------------+---------+-----------+----------+--------------+  Summary: RIGHT: - There is no evidence of deep vein thrombosis in the lower extremity.  - No cystic structure found in the popliteal fossa.   LEFT: - No evidence of common femoral vein obstruction.   *See table(s) above for measurements and observations. Electronically signed by Lonni Gaskins MD on 09/26/2023 at 2:54:10 PM.    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 Izell recommends radiation to reduce the risk of locoregional disease recurrence. Today we discussed the natural course of breast cancer. We highlighted the role of radiotherapy in the management and focused on the details of logistics and delivery. We reviewed the anticipated acute and late sequelae associated with radiation in this setting. The patient was encouraged to ask questions that I answered to the best of my ability. Patient expressed readiness to proceed with treatment. A consent form was reviewed and signed today.    Plan:  Patient is scheduled for CT simulation on 10/25/2023. Dr. Izell anticipates 4 weeks of radiation directed at the left breast.   Patient will continue to follow with medical oncology for management of chemotherapy related side effects. She is scheduled to see Dr. Odean next on 11/28/2023.   -----------------------------------   Leeroy Due, PA-C

## 2023-10-23 ENCOUNTER — Ambulatory Visit: Admitting: Hematology and Oncology

## 2023-10-23 ENCOUNTER — Other Ambulatory Visit

## 2023-10-23 ENCOUNTER — Ambulatory Visit

## 2023-10-23 NOTE — Progress Notes (Signed)
 Location of Breast Cancer:Malignant neoplasm of upper-outer quadrant of left breast in female, estrogen receptor negative (HCC)   The patient returns today to discuss radiation treatment options. She was seen in the multidisciplinary breast clinic on 06/07/2023. Histology per Pathology Report:   Receptor Status: ER(Negative), PR (Negative), Her2-neu (Positive), Ki-67(15%)  Did patient present with symptoms (if so, please note symptoms) or was this found on screening mammography?:   Past/Anticipated interventions by surgeon, if any: BREAST LUMPECTOMY WITH RADIOACTIVE SEED AND SENTINEL LYMPH NODE BIOPSY (Left: Breast) INSERTION, TUNNELED CENTRAL VENOUS DEVICE, WITH PORT 06/29/2023  Return in about 6 months (around 02/04/2024).  DEWARD GARNETTE NULL, MD  Past/Anticipated interventions by medical oncology, if any: Chemotherapy  Assessment & Plan Stage 1A 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 Echocardiogram suitable for Herceptin  treatment. - Administer Herceptin  as scheduled. - Follow up with Dr. Izell on October 24, 2023 to discuss adjuvant radiation therapy. Morna Kendall, NP 10/17/23 9:53 AM   Treatment plan: Adjuvant chemotherapy with Taxol  Herceptin  followed by Herceptin  maintenance for 1 year Adjuvant radiation therapy  Naomi MARLA Chad, MD 08/21/23 Lymphedema issues, if any:  {:18581} {t:21944}   Pain issues, if any:  {:18581} {PAIN DESCRIPTION:21022940}  SAFETY ISSUES: Prior radiation? {:18581} Pacemaker/ICD? {:18581} Possible current pregnancy?{:18581} Is the patient on methotrexate? {:18581}  Current Complaints / other details:  ***

## 2023-10-24 ENCOUNTER — Other Ambulatory Visit (HOSPITAL_COMMUNITY): Payer: Self-pay

## 2023-10-24 ENCOUNTER — Ambulatory Visit
Admission: RE | Admit: 2023-10-24 | Discharge: 2023-10-24 | Disposition: A | Discharge status: Home / Self Care | Source: Ambulatory Visit | Attending: Radiology | Admitting: Radiology

## 2023-10-24 ENCOUNTER — Encounter: Payer: Self-pay | Admitting: Radiology

## 2023-10-24 ENCOUNTER — Ambulatory Visit
Admission: RE | Admit: 2023-10-24 | Discharge: 2023-10-24 | Disposition: A | Discharge status: Home / Self Care | Source: Ambulatory Visit | Attending: Radiation Oncology | Admitting: Radiation Oncology

## 2023-10-24 VITALS — BP 159/71 | HR 86 | Temp 97.3°F | Resp 20 | Ht 64.0 in | Wt 161.2 lb

## 2023-10-24 DIAGNOSIS — Z7984 Long term (current) use of oral hypoglycemic drugs: Secondary | ICD-10-CM | POA: Insufficient documentation

## 2023-10-24 DIAGNOSIS — Z794 Long term (current) use of insulin: Secondary | ICD-10-CM | POA: Insufficient documentation

## 2023-10-24 DIAGNOSIS — Z171 Estrogen receptor negative status [ER-]: Secondary | ICD-10-CM | POA: Insufficient documentation

## 2023-10-24 DIAGNOSIS — Z79899 Other long term (current) drug therapy: Secondary | ICD-10-CM | POA: Insufficient documentation

## 2023-10-24 DIAGNOSIS — Z7902 Long term (current) use of antithrombotics/antiplatelets: Secondary | ICD-10-CM | POA: Diagnosis not present

## 2023-10-24 DIAGNOSIS — Z1722 Progesterone receptor negative status: Secondary | ICD-10-CM | POA: Diagnosis not present

## 2023-10-24 DIAGNOSIS — Z1732 Human epidermal growth factor receptor 2 negative status: Secondary | ICD-10-CM | POA: Diagnosis not present

## 2023-10-24 DIAGNOSIS — C50412 Malignant neoplasm of upper-outer quadrant of left female breast: Secondary | ICD-10-CM

## 2023-10-24 DIAGNOSIS — G629 Polyneuropathy, unspecified: Secondary | ICD-10-CM | POA: Insufficient documentation

## 2023-10-24 DIAGNOSIS — Z9221 Personal history of antineoplastic chemotherapy: Secondary | ICD-10-CM | POA: Diagnosis not present

## 2023-10-25 ENCOUNTER — Ambulatory Visit
Admission: RE | Admit: 2023-10-25 | Discharge: 2023-10-25 | Disposition: A | Discharge status: Home / Self Care | Source: Ambulatory Visit | Attending: Radiation Oncology | Admitting: Radiation Oncology

## 2023-10-25 DIAGNOSIS — D45 Polycythemia vera: Secondary | ICD-10-CM | POA: Insufficient documentation

## 2023-10-25 DIAGNOSIS — Z5111 Encounter for antineoplastic chemotherapy: Secondary | ICD-10-CM | POA: Diagnosis present

## 2023-10-25 DIAGNOSIS — C50412 Malignant neoplasm of upper-outer quadrant of left female breast: Secondary | ICD-10-CM | POA: Insufficient documentation

## 2023-10-25 DIAGNOSIS — Z923 Personal history of irradiation: Secondary | ICD-10-CM | POA: Insufficient documentation

## 2023-10-25 DIAGNOSIS — Z171 Estrogen receptor negative status [ER-]: Secondary | ICD-10-CM | POA: Insufficient documentation

## 2023-10-25 DIAGNOSIS — F17210 Nicotine dependence, cigarettes, uncomplicated: Secondary | ICD-10-CM | POA: Insufficient documentation

## 2023-10-25 DIAGNOSIS — R600 Localized edema: Secondary | ICD-10-CM | POA: Diagnosis not present

## 2023-10-25 DIAGNOSIS — E1151 Type 2 diabetes mellitus with diabetic peripheral angiopathy without gangrene: Secondary | ICD-10-CM | POA: Diagnosis not present

## 2023-10-25 DIAGNOSIS — Z79899 Other long term (current) drug therapy: Secondary | ICD-10-CM | POA: Diagnosis not present

## 2023-10-25 DIAGNOSIS — Z510 Encounter for antineoplastic radiation therapy: Secondary | ICD-10-CM | POA: Insufficient documentation

## 2023-10-26 ENCOUNTER — Inpatient Hospital Stay: Attending: Hematology and Oncology | Admitting: Adult Health

## 2023-10-26 ENCOUNTER — Encounter: Payer: Self-pay | Admitting: Adult Health

## 2023-10-26 VITALS — BP 161/57 | HR 93 | Temp 97.6°F | Resp 18 | Wt 166.4 lb

## 2023-10-26 DIAGNOSIS — F17210 Nicotine dependence, cigarettes, uncomplicated: Secondary | ICD-10-CM | POA: Insufficient documentation

## 2023-10-26 DIAGNOSIS — Z79899 Other long term (current) drug therapy: Secondary | ICD-10-CM | POA: Insufficient documentation

## 2023-10-26 DIAGNOSIS — D45 Polycythemia vera: Secondary | ICD-10-CM | POA: Insufficient documentation

## 2023-10-26 DIAGNOSIS — R600 Localized edema: Secondary | ICD-10-CM | POA: Insufficient documentation

## 2023-10-26 DIAGNOSIS — Z171 Estrogen receptor negative status [ER-]: Secondary | ICD-10-CM | POA: Insufficient documentation

## 2023-10-26 DIAGNOSIS — E1151 Type 2 diabetes mellitus with diabetic peripheral angiopathy without gangrene: Secondary | ICD-10-CM | POA: Insufficient documentation

## 2023-10-26 DIAGNOSIS — C50412 Malignant neoplasm of upper-outer quadrant of left female breast: Secondary | ICD-10-CM | POA: Diagnosis not present

## 2023-10-26 DIAGNOSIS — Z5111 Encounter for antineoplastic chemotherapy: Secondary | ICD-10-CM | POA: Diagnosis not present

## 2023-10-26 DIAGNOSIS — Z923 Personal history of irradiation: Secondary | ICD-10-CM | POA: Insufficient documentation

## 2023-10-26 NOTE — Progress Notes (Signed)
 [facility] Cancer Follow up:    Pcp, No No address on file   DIAGNOSIS: Cancer Staging  Malignant neoplasm of upper-outer quadrant of left breast in female, estrogen receptor negative (HCC) Staging form: Breast, AJCC 8th Edition - Clinical: Stage IA (cT1c, cN0, cM0, G2, ER-, PR-, HER2+) - Signed by Odean Potts, MD on 06/07/2023 Stage prefix: Initial diagnosis Histologic grading system: 3 grade system - Pathologic stage from 06/29/2023: Stage IA (pT1c, pN0, cM0, G2, ER-, PR-, HER2+) - Signed by Crawford Morna Pickle, NP on 09/11/2023 Stage prefix: Initial diagnosis Histologic grading system: 3 grade system    SUMMARY OF ONCOLOGIC HISTORY: Oncology History  Malignant neoplasm of upper-outer quadrant of left breast in female, estrogen receptor negative (HCC)  05/31/2023 Initial Diagnosis   Screening mammogram detected left breast mass and asymmetry retroareolar region, irregular spiculated mass 1.1 cm, axilla negative, ultrasound measured 1.2 cm, biopsy: Grade 2 IDC ER 0% PR 0% Ki67 15%, HER2 3+ positive   06/07/2023 Cancer Staging   Staging form: Breast, AJCC 8th Edition - Clinical: Stage IA (cT1c, cN0, cM0, G2, ER-, PR-, HER2+) - Signed by Odean Potts, MD on 06/07/2023 Stage prefix: Initial diagnosis Histologic grading system: 3 grade system   06/29/2023 Surgery   Left lumpectomy: 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, margins negative, 0/3 lymph nodes negative, ER 0%, PR 0%, HER2 3+ positive, Ki-67 15%    06/29/2023 Cancer Staging   Staging form: Breast, AJCC 8th Edition - Pathologic stage from 06/29/2023: Stage IA (pT1c, pN0, cM0, G2, ER-, PR-, HER2+) - Signed by Crawford Morna Pickle, NP on 09/11/2023 Stage prefix: Initial diagnosis Histologic grading system: 3 grade system   08/07/2023 -  Chemotherapy   Patient is on Treatment Plan : BREAST Paclitaxel  + Trastuzumab  q7d / Trastuzumab  q21d       CURRENT THERAPY: Herceptin , to begin adjuvant  radaition  INTERVAL HISTORY:  Discussed the use of AI scribe software for clinical note transcription with the patient, who gave verbal consent to proceed.  History of Present Illness Mary Campbell is a 58 year old female with breast cancer who presents with right lower extremity redness and swelling.  She is undergoing treatment for breast cancer with Herceptin  every three weeks and is about to begin adjuvant radiation therapy.  Right lower extremity redness and swelling have persisted since September 30th, 2025. The area is stiff but not painful or sore. She does not use compression stockings.  She experiences numbness in the right tips of her fingers, which began with her treatment, accompanied by pain and swelling in the hand and wrist area. She does not use a wrist brace. No recent fevers or chills.    Patient Active Problem List   Diagnosis Date Noted   Port-A-Cath in place 08/07/2023   Severe obstructive sleep apnea 06/19/2023   Antiplatelet or antithrombotic long-term use 06/15/2023   Positive colorectal cancer screening using Cologuard test 06/15/2023   Malignant neoplasm of upper-outer quadrant of left breast in female, estrogen receptor negative (HCC) 06/05/2023   Excessive daytime sleepiness 12/22/2022   DOE (dyspnea on exertion) 12/22/2022   Centrilobular emphysema (HCC) 12/22/2022   Claudication in peripheral vascular disease 10/04/2018   Essential hypertension 10/04/2018   Hyperlipidemia 10/04/2018   Tobacco abuse 10/04/2018   Family history of heart disease 10/04/2018    is allergic to codeine, other, sulfonamide derivatives, and lisinopril.  MEDICAL HISTORY: Past Medical History:  Diagnosis Date   Anxiety    Arthritis  Breast cancer (HCC)    left   Chronic back pain    COPD (chronic obstructive pulmonary disease) (HCC)    Diabetes mellitus    Difficult intubation    Diverticulitis    Dyspnea    with exertion   Fibromyalgia    Hyperlipidemia     Hypertension    PAD (peripheral artery disease)    a. PV angio 10/2018 - bilateral SFA disease s/p L intervention.   PAD (peripheral artery disease)    PONV (postoperative nausea and vomiting)    Sleep apnea    Tobacco abuse     SURGICAL HISTORY: Past Surgical History:  Procedure Laterality Date   ABDOMINAL AORTOGRAM W/LOWER EXTREMITY Right 10/18/2018   Procedure: ABDOMINAL AORTOGRAM W/LOWER EXTREMITY;  Surgeon: Court Dorn PARAS, MD;  [facility];  Service: Cardiovascular;  Laterality: Right;   ABDOMINAL AORTOGRAM W/LOWER EXTREMITY N/A 04/02/2020   Procedure: ABDOMINAL AORTOGRAM W/LOWER EXTREMITY;  Surgeon: Court Dorn PARAS, MD;  [facility];  Service: Cardiovascular;  Laterality: N/A;   ABDOMINAL AORTOGRAM W/LOWER EXTREMITY N/A 04/23/2020   Procedure: ABDOMINAL AORTOGRAM W/LOWER EXTREMITY;  Surgeon: Court Dorn PARAS, MD;  [facility];  Service: Cardiovascular;  Laterality: N/A;   ABDOMINAL HYSTERECTOMY     BREAST BIOPSY Left 05/31/2023   US  LT BREAST BX W LOC DEV 1ST LESION IMG BX SPEC US  GUIDE 05/31/2023 [facility]   BREAST BIOPSY  06/27/2023   MM LT RADIOACTIVE SEED LOC MAMMO GUIDE 06/27/2023 [facility]   BREAST LUMPECTOMY WITH RADIOACTIVE SEED AND SENTINEL LYMPH NODE BIOPSY Left 06/29/2023   Procedure: BREAST LUMPECTOMY WITH RADIOACTIVE SEED AND SENTINEL LYMPH NODE BIOPSY;  Surgeon: Curvin Deward MOULD, MD;  Location: [facility];  Service: General;  Laterality: Left;  LEFT BREAST RADIOACTIVE SEED LOCALIZED LUMPECTOMY   SENTINEL NODE BIOPSY   CHOLECYSTECTOMY     PERIPHERAL VASCULAR ATHERECTOMY Left 10/18/2018   Procedure: PERIPHERAL VASCULAR ATHERECTOMY;  Surgeon: Court Dorn PARAS, MD;  [facility];  Service: Cardiovascular;  Laterality: Left;  SFA   PERIPHERAL VASCULAR ATHERECTOMY Right 11/22/2018   Procedure: PERIPHERAL VASCULAR ATHERECTOMY;  Surgeon: Court Dorn PARAS, MD;  Location: [facility] INVASIVE CV LAB;  Service:  Cardiovascular;  Laterality: Right;  SFA   PERIPHERAL VASCULAR ATHERECTOMY  04/23/2020   Procedure: PERIPHERAL VASCULAR ATHERECTOMY;  Surgeon: Court Dorn PARAS, MD;  Location: [facility] INVASIVE CV LAB;  Service: Cardiovascular;;   PERIPHERAL VASCULAR BALLOON ANGIOPLASTY Right 11/22/2018   Procedure: PERIPHERAL VASCULAR BALLOON ANGIOPLASTY;  Surgeon: Court Dorn PARAS, MD;  [facility];  Service: Cardiovascular;  Laterality: Right;  SFA   PERIPHERAL VASCULAR BALLOON ANGIOPLASTY  04/23/2020   Procedure: PERIPHERAL VASCULAR BALLOON ANGIOPLASTY;  Surgeon: Court Dorn PARAS, MD;  [facility];  Service: Cardiovascular;;   PERIPHERAL VASCULAR INTERVENTION Left 04/02/2020   Procedure: PERIPHERAL VASCULAR INTERVENTION;  Surgeon: Court Dorn PARAS, MD;  [facility];  Service: Cardiovascular;  Laterality: Left;   PORTACATH PLACEMENT N/A 06/29/2023   Procedure: INSERTION, TUNNELED CENTRAL VENOUS DEVICE, WITH PORT;  Surgeon: Curvin Deward MOULD, MD;  [facility]: [facility];  Service: General;  Laterality: N/A;  PORT PLACEMENT WITH ULTRASOUND GUIDANCE GEN w/PEC BLOCK    SOCIAL HISTORY: Social History   Socioeconomic History   Marital status: Legally Separated    Spouse name: Not on file   Number of children: 2   Years of education: Not on file   Highest education level: Not on  file  Occupational History   Occupation: disabled  Tobacco Use   Smoking status: Some Days    Current packs/day: 0.00    Types: Cigarettes    Last attempt to quit: 08/11/2022    Years since quitting: 1.2   Smokeless tobacco: Never   Tobacco comments:    Trying quit with patch  Vaping Use   Vaping status: Never Used  Substance and Sexual Activity   Alcohol use: No   Drug use: No   Sexual activity: Yes    Birth control/protection: Surgical  Other Topics Concern   Not on file  Social History Narrative   Not on file   Social Drivers of Health   Financial Resource Strain: Not on file   Food Insecurity: No Food Insecurity (10/24/2023)   Hunger Vital Sign    Worried About Running Out of Food in the Last Year: Never true    Ran Out of Food in the Last Year: Never true  Recent Concern: Food Insecurity - Food Insecurity Present (09/26/2023)   Hunger Vital Sign    Worried About Running Out of Food in the Last Year: Sometimes true    Ran Out of Food in the Last Year: Never true  Transportation Needs: No Transportation Needs (10/24/2023)   PRAPARE - Administrator, Civil Service (Medical): No    Lack of Transportation (Non-Medical): No  Physical Activity: Not on file  Stress: Not on file  Social Connections: Not on file  Intimate Partner Violence: Not At Risk (10/24/2023)   Humiliation, Afraid, Rape, and Kick questionnaire    Fear of Current or Ex-Partner: No    Emotionally Abused: No    Physically Abused: No    Sexually Abused: No    FAMILY HISTORY: Family History  Problem Relation Age of Onset   Diabetes Mother    Hypertension Mother    Hyperlipidemia Mother    Heart attack Father    Heart disease Father    Hypertension Father    Hyperlipidemia Father    Breast cancer Neg Hx    Colon cancer Neg Hx    Esophageal cancer Neg Hx     Review of Systems  Constitutional:  Negative for appetite change, chills, fatigue, fever and unexpected weight change.  HENT:   Negative for hearing loss, lump/mass and trouble swallowing.   Eyes:  Negative for eye problems and icterus.  Respiratory:  Negative for chest tightness, cough and shortness of breath.   Cardiovascular:  Negative for chest pain, leg swelling and palpitations.  Gastrointestinal:  Negative for abdominal distention, abdominal pain, constipation, diarrhea, nausea and vomiting.  Endocrine: Negative for hot flashes.  Genitourinary:  Negative for difficulty urinating.   Musculoskeletal:  Negative for arthralgias.  Skin:  Negative for itching and rash.  Neurological:  Negative for dizziness, extremity  weakness, headaches and numbness.  Hematological:  Negative for adenopathy. Does not bruise/bleed easily.  Psychiatric/Behavioral:  Negative for depression. The patient is not nervous/anxious.     PHYSICAL EXAMINATION  Onc Performance Status - 10/26/23 1119       ECOG Perf Status   ECOG Perf Status Restricted in physically strenuous activity but ambulatory and able to carry out work of a light or sedentary nature, e.g., light house work, office work      KPS SCALE   KPS % SCORE Able to carry on normal activity, minor s/s of disease          Vitals:   10/26/23 1115  BP: ROLLEN)  161/57  Pulse: 93  Resp: 18  Temp: 97.6 F (36.4 C)  SpO2: 100%    Physical Exam Constitutional:      General: She is not in acute distress.    Appearance: Normal appearance. She is not toxic-appearing.  HENT:     Head: Normocephalic and atraumatic.     Mouth/Throat:     Mouth: Mucous membranes are moist.     Pharynx: Oropharynx is clear. No oropharyngeal exudate or posterior oropharyngeal erythema.  Eyes:     General: No scleral icterus. Cardiovascular:     Rate and Rhythm: Normal rate and regular rhythm.     Pulses: Normal pulses.     Heart sounds: Normal heart sounds.  Pulmonary:     Effort: Pulmonary effort is normal.     Breath sounds: Normal breath sounds.  Abdominal:     General: Abdomen is flat. Bowel sounds are normal. There is no distension.     Palpations: Abdomen is soft.     Tenderness: There is no abdominal tenderness.  Musculoskeletal:        General: No swelling.     Cervical back: Neck supple.  Lymphadenopathy:     Cervical: No cervical adenopathy.  Skin:    General: Skin is warm and dry.     Findings: No rash.     Comments: Right leg markedly improved.  Swelling, redness improved.  Patient erythema improves with leg elevation.   Neurological:     General: No focal deficit present.     Mental Status: She is alert.  Psychiatric:        Mood and Affect: Mood normal.         Behavior: Behavior normal.     LABORATORY DATA:  CBC    Component Value Date/Time   WBC 11.7 (H) 10/17/2023 1010   WBC 5.8 09/17/2023 1755   RBC 3.94 10/17/2023 1010   HGB 11.4 (L) 10/17/2023 1010   HGB 12.0 04/15/2020 1537   HCT 34.8 (L) 10/17/2023 1010   HCT 36.5 04/15/2020 1537   PLT 309 10/17/2023 1010   PLT 323 04/15/2020 1537   MCV 88.3 10/17/2023 1010   MCV 87 04/15/2020 1537   MCH 28.9 10/17/2023 1010   MCHC 32.8 10/17/2023 1010   RDW 16.4 (H) 10/17/2023 1010   RDW 14.4 04/15/2020 1537   LYMPHSABS 2.1 10/17/2023 1010   LYMPHSABS 2.5 10/25/2018 1552   MONOABS 0.7 10/17/2023 1010   EOSABS 0.4 10/17/2023 1010   EOSABS 0.2 10/25/2018 1552   BASOSABS 0.1 10/17/2023 1010   BASOSABS 0.1 10/25/2018 1552    CMP     Component Value Date/Time   NA 138 10/17/2023 1010   NA 139 04/15/2020 1537   K 4.2 10/17/2023 1010   CL 105 10/17/2023 1010   CO2 28 10/17/2023 1010   GLUCOSE 86 10/17/2023 1010   BUN 10 10/17/2023 1010   BUN 9 04/15/2020 1537   CREATININE 0.87 10/17/2023 1010   CALCIUM  8.8 (L) 10/17/2023 1010   PROT 6.8 10/17/2023 1010   PROT 7.0 03/11/2020 1005   ALBUMIN 3.7 10/17/2023 1010   ALBUMIN 4.3 03/11/2020 1005   AST 12 (L) 10/17/2023 1010   ALT 7 10/17/2023 1010   ALKPHOS 153 (H) 10/17/2023 1010   BILITOT 0.2 10/17/2023 1010   GFRNONAA >60 10/17/2023 1010   GFRAA 73 03/11/2020 1005     ASSESSMENT and THERAPY PLAN:   Assessment and Plan Assessment & Plan Breast cancer, status post Herceptin  therapy and planned  adjuvant radiation Receiving Herceptin  every three weeks. Adjuvant radiation therapy planned, simulation completed. Herceptin  therapy to continue through June or July next year. Annual mammogram due in May. - Continue Herceptin  therapy every three weeks. - Start adjuvant radiation therapy next week. - Schedule mammogram in May. - Discuss treatment efficacy with Dr. Gudena during appointment with him in November, including potential  blood test.  Right lower extremity edema and erythema Improvement in redness and swelling. Doppler negative for DVT. Symptoms improve with leg elevation. - Elevate right lower extremity. - Obtain compression stockings, preferably diabetic type.  Right hand numbness and swelling, possible median nerve compression Numbness and swelling possibly due to median nerve compression. Wrist brace recommended. - Obtain wrist brace for right hand.  Peripheral arterial disease of bilateral lower extremities Peripheral arterial disease present. Pulses in legs are good.  RTC every 3 weeks for herceptin , see provider with every other Herceptin     All questions were answered. The patient knows to call the clinic with any problems, questions or concerns. We can certainly see the patient much sooner if necessary.  Total encounter time:20 minutes*in face-to-face visit time, chart review, lab review, care coordination, order entry, and documentation of the encounter time.    Morna Kendall, NP 10/26/23 11:34 AM Medical Oncology and Hematology [facility] [address] [place], [place] [address] Tel. [phone]    Fax. [phone]  *Total Encounter Time as defined by the Centers for Medicare and Medicaid Services includes, in addition to the face-to-face time of a patient visit (documented in the note above) non-face-to-face time: obtaining and reviewing outside history, ordering and reviewing medications, tests or procedures, care coordination (communications with other health care professionals or caregivers) and documentation in the medical record.

## 2023-10-29 ENCOUNTER — Encounter: Payer: Self-pay | Admitting: Hematology and Oncology

## 2023-10-30 ENCOUNTER — Other Ambulatory Visit: Payer: Self-pay | Admitting: Adult Health

## 2023-10-30 ENCOUNTER — Other Ambulatory Visit: Payer: Self-pay

## 2023-10-30 ENCOUNTER — Other Ambulatory Visit (HOSPITAL_COMMUNITY): Payer: Self-pay

## 2023-10-30 DIAGNOSIS — R609 Edema, unspecified: Secondary | ICD-10-CM

## 2023-10-30 DIAGNOSIS — C50412 Malignant neoplasm of upper-outer quadrant of left female breast: Secondary | ICD-10-CM

## 2023-10-30 MED ORDER — FUROSEMIDE 20 MG PO TABS
ORAL_TABLET | ORAL | 0 refills | Status: DC
  Filled 2023-10-30: qty 30, 30d supply, fill #0

## 2023-10-30 MED ORDER — POTASSIUM CHLORIDE ER 10 MEQ PO TBCR
10.0000 meq | EXTENDED_RELEASE_TABLET | Freq: Two times a day (BID) | ORAL | 0 refills | Status: DC
  Filled 2023-10-30: qty 28, 14d supply, fill #0

## 2023-10-31 ENCOUNTER — Encounter: Payer: Self-pay | Admitting: *Deleted

## 2023-10-31 DIAGNOSIS — Z5111 Encounter for antineoplastic chemotherapy: Secondary | ICD-10-CM | POA: Diagnosis not present

## 2023-11-01 ENCOUNTER — Ambulatory Visit
Admission: RE | Admit: 2023-11-01 | Discharge: 2023-11-01 | Disposition: A | Discharge status: Home / Self Care | Source: Ambulatory Visit | Attending: Radiation Oncology | Admitting: Radiation Oncology

## 2023-11-01 ENCOUNTER — Other Ambulatory Visit: Payer: Self-pay

## 2023-11-01 DIAGNOSIS — Z5111 Encounter for antineoplastic chemotherapy: Secondary | ICD-10-CM | POA: Diagnosis not present

## 2023-11-01 LAB — RAD ONC ARIA SESSION SUMMARY
Course Elapsed Days: 0
Plan Fractions Treated to Date: 1
Plan Prescribed Dose Per Fraction: 2.67 Gy
Plan Total Fractions Prescribed: 15
Plan Total Prescribed Dose: 40.05 Gy
Reference Point Dosage Given to Date: 2.67 Gy
Reference Point Session Dosage Given: 2.67 Gy
Session Number: 1

## 2023-11-02 ENCOUNTER — Other Ambulatory Visit: Payer: Self-pay

## 2023-11-02 ENCOUNTER — Ambulatory Visit
Admission: RE | Admit: 2023-11-02 | Discharge: 2023-11-02 | Disposition: A | Discharge status: Home / Self Care | Source: Ambulatory Visit | Attending: Radiation Oncology | Admitting: Radiation Oncology

## 2023-11-02 DIAGNOSIS — Z5111 Encounter for antineoplastic chemotherapy: Secondary | ICD-10-CM | POA: Diagnosis not present

## 2023-11-02 LAB — RAD ONC ARIA SESSION SUMMARY
Course Elapsed Days: 1
Plan Fractions Treated to Date: 2
Plan Prescribed Dose Per Fraction: 2.67 Gy
Plan Total Fractions Prescribed: 15
Plan Total Prescribed Dose: 40.05 Gy
Reference Point Dosage Given to Date: 5.34 Gy
Reference Point Session Dosage Given: 2.67 Gy
Session Number: 2

## 2023-11-03 ENCOUNTER — Ambulatory Visit: Admitting: Cardiovascular Disease

## 2023-11-03 ENCOUNTER — Other Ambulatory Visit: Payer: Self-pay

## 2023-11-03 ENCOUNTER — Ambulatory Visit
Admission: RE | Admit: 2023-11-03 | Discharge: 2023-11-03 | Disposition: A | Discharge status: Home / Self Care | Source: Ambulatory Visit | Attending: Radiation Oncology | Admitting: Radiation Oncology

## 2023-11-03 DIAGNOSIS — Z5111 Encounter for antineoplastic chemotherapy: Secondary | ICD-10-CM | POA: Diagnosis not present

## 2023-11-03 LAB — RAD ONC ARIA SESSION SUMMARY
Course Elapsed Days: 2
Plan Fractions Treated to Date: 3
Plan Prescribed Dose Per Fraction: 2.67 Gy
Plan Total Fractions Prescribed: 15
Plan Total Prescribed Dose: 40.05 Gy
Reference Point Dosage Given to Date: 8.01 Gy
Reference Point Session Dosage Given: 2.67 Gy
Session Number: 3

## 2023-11-06 ENCOUNTER — Other Ambulatory Visit: Payer: Self-pay

## 2023-11-06 ENCOUNTER — Ambulatory Visit
Admission: RE | Admit: 2023-11-06 | Discharge: 2023-11-06 | Disposition: A | Source: Ambulatory Visit | Attending: Radiation Oncology

## 2023-11-06 ENCOUNTER — Ambulatory Visit
Admission: RE | Admit: 2023-11-06 | Discharge: 2023-11-06 | Disposition: A | Discharge status: Home / Self Care | Source: Ambulatory Visit | Attending: Radiation Oncology | Admitting: Radiation Oncology

## 2023-11-06 DIAGNOSIS — Z5111 Encounter for antineoplastic chemotherapy: Secondary | ICD-10-CM | POA: Diagnosis not present

## 2023-11-06 DIAGNOSIS — Z171 Estrogen receptor negative status [ER-]: Secondary | ICD-10-CM

## 2023-11-06 LAB — RAD ONC ARIA SESSION SUMMARY
Course Elapsed Days: 5
Plan Fractions Treated to Date: 4
Plan Prescribed Dose Per Fraction: 2.67 Gy
Plan Total Fractions Prescribed: 15
Plan Total Prescribed Dose: 40.05 Gy
Reference Point Dosage Given to Date: 10.68 Gy
Reference Point Session Dosage Given: 2.67 Gy
Session Number: 4

## 2023-11-06 MED ORDER — ALRA NON-METALLIC DEODORANT (RAD-ONC)
1.0000 | Freq: Once | TOPICAL | Status: AC
  Administered 2023-11-06: 1 via TOPICAL

## 2023-11-06 MED ORDER — RADIAPLEXRX EX GEL: Freq: Once | CUTANEOUS | Status: AC

## 2023-11-07 ENCOUNTER — Inpatient Hospital Stay

## 2023-11-07 ENCOUNTER — Other Ambulatory Visit: Payer: Self-pay

## 2023-11-07 ENCOUNTER — Ambulatory Visit
Admission: RE | Admit: 2023-11-07 | Discharge: 2023-11-07 | Disposition: A | Source: Ambulatory Visit | Attending: Radiation Oncology

## 2023-11-07 ENCOUNTER — Telehealth: Payer: Self-pay | Admitting: *Deleted

## 2023-11-07 VITALS — BP 185/72 | HR 78 | Temp 98.4°F | Resp 18 | Ht 64.0 in | Wt 170.8 lb

## 2023-11-07 DIAGNOSIS — Z171 Estrogen receptor negative status [ER-]: Secondary | ICD-10-CM

## 2023-11-07 DIAGNOSIS — Z5111 Encounter for antineoplastic chemotherapy: Secondary | ICD-10-CM | POA: Diagnosis not present

## 2023-11-07 LAB — CBC WITH DIFFERENTIAL (CANCER CENTER ONLY)
Abs Immature Granulocytes: 0.05 K/uL (ref 0.00–0.07)
Basophils Absolute: 0.1 K/uL (ref 0.0–0.1)
Basophils Relative: 1 %
Eosinophils Absolute: 0.2 K/uL (ref 0.0–0.5)
Eosinophils Relative: 2 %
HCT: 32.3 % — ABNORMAL LOW (ref 36.0–46.0)
Hemoglobin: 10.8 g/dL — ABNORMAL LOW (ref 12.0–15.0)
Immature Granulocytes: 0 %
Lymphocytes Relative: 16 %
Lymphs Abs: 1.9 K/uL (ref 0.7–4.0)
MCH: 29.3 pg (ref 26.0–34.0)
MCHC: 33.4 g/dL (ref 30.0–36.0)
MCV: 87.5 fL (ref 80.0–100.0)
Monocytes Absolute: 0.5 K/uL (ref 0.1–1.0)
Monocytes Relative: 5 %
Neutro Abs: 8.7 K/uL — ABNORMAL HIGH (ref 1.7–7.7)
Neutrophils Relative %: 76 %
Platelet Count: 257 K/uL (ref 150–400)
RBC: 3.69 MIL/uL — ABNORMAL LOW (ref 3.87–5.11)
RDW: 15.6 % — ABNORMAL HIGH (ref 11.5–15.5)
WBC Count: 11.4 K/uL — ABNORMAL HIGH (ref 4.0–10.5)
nRBC: 0 % (ref 0.0–0.2)

## 2023-11-07 LAB — CMP (CANCER CENTER ONLY)
ALT: 7 U/L (ref 0–44)
AST: <10 U/L — ABNORMAL LOW (ref 15–41)
Albumin: 3.5 g/dL (ref 3.5–5.0)
Alkaline Phosphatase: 141 U/L — ABNORMAL HIGH (ref 38–126)
Anion gap: 6 (ref 5–15)
BUN: 15 mg/dL (ref 6–20)
CO2: 27 mmol/L (ref 22–32)
Calcium: 8.9 mg/dL (ref 8.9–10.3)
Chloride: 106 mmol/L (ref 98–111)
Creatinine: 0.86 mg/dL (ref 0.44–1.00)
GFR, Estimated: >60 mL/min (ref >60)
Glucose, Bld: 141 mg/dL — ABNORMAL HIGH (ref 70–99)
Potassium: 3.9 mmol/L (ref 3.5–5.1)
Sodium: 139 mmol/L (ref 135–145)
Total Bilirubin: 0.3 mg/dL (ref 0.0–1.2)
Total Protein: 6.4 g/dL — ABNORMAL LOW (ref 6.5–8.1)

## 2023-11-07 LAB — RAD ONC ARIA SESSION SUMMARY
Course Elapsed Days: 6
Plan Fractions Treated to Date: 5
Plan Prescribed Dose Per Fraction: 2.67 Gy
Plan Total Fractions Prescribed: 15
Plan Total Prescribed Dose: 40.05 Gy
Reference Point Dosage Given to Date: 13.35 Gy
Reference Point Session Dosage Given: 2.67 Gy
Session Number: 5

## 2023-11-07 MED ORDER — TRASTUZUMAB-ANNS CHEMO 150 MG IV SOLR
6.0000 mg/kg | Freq: Once | INTRAVENOUS | Status: AC
  Administered 2023-11-07: 483 mg via INTRAVENOUS
  Filled 2023-11-07: qty 23

## 2023-11-07 MED ORDER — SODIUM CHLORIDE 0.9 % IV SOLN: INTRAVENOUS | Status: DC

## 2023-11-07 MED ORDER — ACETAMINOPHEN 325 MG PO TABS
650.0000 mg | ORAL_TABLET | Freq: Once | ORAL | Status: AC
  Administered 2023-11-07: 650 mg via ORAL
  Filled 2023-11-07: qty 2

## 2023-11-07 MED ORDER — CLONIDINE HCL 0.1 MG PO TABS
0.1000 mg | ORAL_TABLET | Freq: Once | ORAL | Status: AC
  Administered 2023-11-07: 0.1 mg via ORAL
  Filled 2023-11-07: qty 1

## 2023-11-07 MED ORDER — DIPHENHYDRAMINE HCL 25 MG PO CAPS
50.0000 mg | ORAL_CAPSULE | Freq: Once | ORAL | Status: AC
  Administered 2023-11-07: 50 mg via ORAL
  Filled 2023-11-07: qty 2

## 2023-11-07 NOTE — Patient Instructions (Signed)
 [facility] - A DEPT OF [facility]   Discharge Instructions: Thank you for choosing [facility] to provide your oncology and hematology care.   If you have a lab appointment with the Cancer Center, please go directly to the Cancer Center and check in at the registration area.   Wear comfortable clothing and clothing appropriate for easy access to any Portacath or PICC line.   We strive to give you quality time with your provider. You may need to reschedule your appointment if you arrive late (15 or more minutes).  Arriving late affects you and other patients whose appointments are after yours.  Also, if you miss three or more appointments without notifying the office, you may be dismissed from the clinic at the provider's discretion.      For prescription refill requests, have your pharmacy contact our office and allow 72 hours for refills to be completed.    Today you received the following chemotherapy and/or immunotherapy agents: Trastuzumab (Kanjinti)      To help prevent nausea and vomiting after your treatment, we encourage you to take your nausea medication as directed.  BELOW ARE SYMPTOMS THAT SHOULD BE REPORTED IMMEDIATELY: *FEVER GREATER THAN 100.4 F (38 C) OR HIGHER *CHILLS OR SWEATING *NAUSEA AND VOMITING THAT IS NOT CONTROLLED WITH YOUR NAUSEA MEDICATION *UNUSUAL SHORTNESS OF BREATH *UNUSUAL BRUISING OR BLEEDING *URINARY PROBLEMS (pain or burning when urinating, or frequent urination) *BOWEL PROBLEMS (unusual diarrhea, constipation, pain near the anus) TENDERNESS IN MOUTH AND THROAT WITH OR WITHOUT PRESENCE OF ULCERS (sore throat, sores in mouth, or a toothache) UNUSUAL RASH, SWELLING OR PAIN  UNUSUAL VAGINAL DISCHARGE OR ITCHING   Items with * indicate a potential emergency and should be followed up as soon as possible or go to the Emergency Department if any problems should occur.  Please show the CHEMOTHERAPY ALERT CARD or  IMMUNOTHERAPY ALERT CARD at check-in to the Emergency Department and triage nurse.  Should you have questions after your visit or need to cancel or reschedule your appointment, please contact [facility] - A DEPT OF [facility]  Dept: [phone]  and follow the prompts.  Office hours are 8:00 a.m. to 4:30 p.m. Monday - Friday. Please note that voicemails left after 4:00 p.m. may not be returned until the following business day.  We are closed weekends and major holidays. You have access to a nurse at all times for urgent questions. Please call the main number to the clinic Dept: [phone] and follow the prompts.   For any non-urgent questions, you may also contact your provider using MyChart. We now offer e-Visits for anyone 33 and older to request care online for non-urgent symptoms. For details visit [email or web address].   Also download the MyChart app! Go to the app store, search "MyChart", open the app, select [facility], and log in with your MyChart username and password.

## 2023-11-07 NOTE — Telephone Encounter (Signed)
 Pt BP remains elevated (186/72) post p.o clonidine 0.1 mg tablet.  Per MD pt PCP needing to be notified for BP management.  RN placed call and LVM at [facility] in [place] for Annabella Grosser FNP to see and evaluate pt.

## 2023-11-07 NOTE — Progress Notes (Signed)
 Pts BP elevated post treatment, given clonidine 0.1mg  per Dr Odean, waited 30 min, bp recheck still elevated 185/72, per dr Odean ok to dc to radiation appointment and followup with pcp regarding bp medications, per pt bp medications have been changed prior to infusion within the last 2 weeks, pt was asymptomatic, denied sob, chest pain, dizziness, pt stated just tired from the benadryl , pt dc to radiation in a wheelchair

## 2023-11-08 ENCOUNTER — Ambulatory Visit
Admission: RE | Admit: 2023-11-08 | Discharge: 2023-11-08 | Disposition: A | Discharge status: Home / Self Care | Source: Ambulatory Visit | Attending: Radiation Oncology | Admitting: Radiation Oncology

## 2023-11-08 ENCOUNTER — Other Ambulatory Visit: Payer: Self-pay

## 2023-11-08 DIAGNOSIS — Z5111 Encounter for antineoplastic chemotherapy: Secondary | ICD-10-CM | POA: Diagnosis not present

## 2023-11-08 LAB — RAD ONC ARIA SESSION SUMMARY
Course Elapsed Days: 7
Plan Fractions Treated to Date: 6
Plan Prescribed Dose Per Fraction: 2.67 Gy
Plan Total Fractions Prescribed: 15
Plan Total Prescribed Dose: 40.05 Gy
Reference Point Dosage Given to Date: 16.02 Gy
Reference Point Session Dosage Given: 2.67 Gy
Session Number: 6

## 2023-11-09 ENCOUNTER — Encounter: Payer: Self-pay | Admitting: Licensed Clinical Social Worker

## 2023-11-09 ENCOUNTER — Inpatient Hospital Stay: Admitting: Licensed Clinical Social Worker

## 2023-11-09 ENCOUNTER — Ambulatory Visit
Admission: RE | Admit: 2023-11-09 | Discharge: 2023-11-09 | Disposition: A | Source: Ambulatory Visit | Attending: Radiation Oncology

## 2023-11-09 ENCOUNTER — Other Ambulatory Visit: Payer: Self-pay

## 2023-11-09 DIAGNOSIS — Z5111 Encounter for antineoplastic chemotherapy: Secondary | ICD-10-CM | POA: Diagnosis not present

## 2023-11-09 LAB — RAD ONC ARIA SESSION SUMMARY
Course Elapsed Days: 8
Plan Fractions Treated to Date: 7
Plan Prescribed Dose Per Fraction: 2.67 Gy
Plan Total Fractions Prescribed: 15
Plan Total Prescribed Dose: 40.05 Gy
Reference Point Dosage Given to Date: 18.69 Gy
Reference Point Session Dosage Given: 2.67 Gy
Session Number: 7

## 2023-11-09 NOTE — Progress Notes (Signed)
 [facility] CSW Progress Note  Clinical Child psychotherapist contacted patient by phone to follow-up on need for community resources. Funds are tighter as her daughter's hours are less consistent with daughter helping bring her to appts. They have had to borrow funds to help keep their water on.    Interventions: Referred patient to community resources: Devere [name] [name] [name]  Will provide weekly bags from [employer] during radiation & local food pantry list Discussed needed bill for [employer] application   Pt has utilized her Licensed conveyancer and [employer].  She does have a transportation benefit through [employer] and CSW encouraged pt to ask if they can do gas reimbursement rather than just rides.     Follow Up Plan:  Patient will provide bill for [employer] application. CSW will provide [facility] food pantry bags every Friday during radiation    Mary Campbell E Mary Friend, LCSW Clinical Social Worker [facility]    Patient is participating in a Managed Medicaid Plan:  Yes

## 2023-11-10 ENCOUNTER — Inpatient Hospital Stay: Admitting: Licensed Clinical Social Worker

## 2023-11-10 ENCOUNTER — Other Ambulatory Visit: Payer: Self-pay

## 2023-11-10 ENCOUNTER — Ambulatory Visit
Admission: RE | Admit: 2023-11-10 | Discharge: 2023-11-10 | Disposition: A | Discharge status: Home / Self Care | Source: Ambulatory Visit | Attending: Radiation Oncology | Admitting: Radiation Oncology

## 2023-11-10 DIAGNOSIS — Z5111 Encounter for antineoplastic chemotherapy: Secondary | ICD-10-CM | POA: Diagnosis not present

## 2023-11-10 LAB — RAD ONC ARIA SESSION SUMMARY
Course Elapsed Days: 9
Plan Fractions Treated to Date: 8
Plan Prescribed Dose Per Fraction: 2.67 Gy
Plan Total Fractions Prescribed: 15
Plan Total Prescribed Dose: 40.05 Gy
Reference Point Dosage Given to Date: 21.36 Gy
Reference Point Session Dosage Given: 2.67 Gy
Session Number: 8

## 2023-11-10 NOTE — Progress Notes (Signed)
 [facility] CSW Progress Note  Clinical Child psychotherapist received eligible bill from patient for [employer]. Application submitted and pt informed of next steps from the foundation.       Follow Up Plan:  Patient will contact CSW with any support or resource needs. CSW will provide food bags weekly during radiation    Mary Myhre [name], LCSW Clinical Social Worker [facility]    Patient is participating in a Managed Medicaid Plan:  Yes

## 2023-11-13 ENCOUNTER — Telehealth: Payer: Self-pay | Admitting: *Deleted

## 2023-11-13 ENCOUNTER — Ambulatory Visit
Admission: RE | Admit: 2023-11-13 | Discharge: 2023-11-13 | Disposition: A | Discharge status: Home / Self Care | Source: Ambulatory Visit | Attending: Radiation Oncology | Admitting: Radiation Oncology

## 2023-11-13 ENCOUNTER — Other Ambulatory Visit: Payer: Self-pay

## 2023-11-13 DIAGNOSIS — Z5111 Encounter for antineoplastic chemotherapy: Secondary | ICD-10-CM | POA: Diagnosis not present

## 2023-11-13 DIAGNOSIS — M7989 Other specified soft tissue disorders: Secondary | ICD-10-CM

## 2023-11-13 LAB — RAD ONC ARIA SESSION SUMMARY
Course Elapsed Days: 12
Plan Fractions Treated to Date: 9
Plan Prescribed Dose Per Fraction: 2.67 Gy
Plan Total Fractions Prescribed: 15
Plan Total Prescribed Dose: 40.05 Gy
Reference Point Dosage Given to Date: 24.03 Gy
Reference Point Session Dosage Given: 2.67 Gy
Session Number: 9

## 2023-11-13 NOTE — Telephone Encounter (Signed)
 Called patient to inform of appt. for doppler on 11-14-23- arrival time- 10:45 am @ [address] lvm for a return call

## 2023-11-14 ENCOUNTER — Other Ambulatory Visit: Payer: Self-pay | Admitting: Hematology and Oncology

## 2023-11-14 ENCOUNTER — Ambulatory Visit (HOSPITAL_BASED_OUTPATIENT_CLINIC_OR_DEPARTMENT_OTHER)
Admission: RE | Admit: 2023-11-14 | Discharge: 2023-11-14 | Disposition: A | Discharge status: Home / Self Care | Source: Ambulatory Visit | Attending: Vascular Surgery | Admitting: Vascular Surgery

## 2023-11-14 ENCOUNTER — Other Ambulatory Visit: Payer: Self-pay | Admitting: Gastroenterology

## 2023-11-14 ENCOUNTER — Ambulatory Visit
Admission: RE | Admit: 2023-11-14 | Discharge: 2023-11-14 | Disposition: A | Source: Ambulatory Visit | Attending: Radiation Oncology

## 2023-11-14 ENCOUNTER — Other Ambulatory Visit: Payer: Self-pay

## 2023-11-14 DIAGNOSIS — M7989 Other specified soft tissue disorders: Secondary | ICD-10-CM | POA: Diagnosis not present

## 2023-11-14 DIAGNOSIS — Z171 Estrogen receptor negative status [ER-]: Secondary | ICD-10-CM

## 2023-11-14 DIAGNOSIS — Z5111 Encounter for antineoplastic chemotherapy: Secondary | ICD-10-CM | POA: Diagnosis not present

## 2023-11-14 LAB — RAD ONC ARIA SESSION SUMMARY
Course Elapsed Days: 13
Plan Fractions Treated to Date: 10
Plan Prescribed Dose Per Fraction: 2.67 Gy
Plan Total Fractions Prescribed: 15
Plan Total Prescribed Dose: 40.05 Gy
Reference Point Dosage Given to Date: 26.7 Gy
Reference Point Session Dosage Given: 2.67 Gy
Session Number: 10

## 2023-11-15 ENCOUNTER — Other Ambulatory Visit: Payer: Self-pay

## 2023-11-15 ENCOUNTER — Ambulatory Visit
Admission: RE | Admit: 2023-11-15 | Discharge: 2023-11-15 | Disposition: A | Discharge status: Home / Self Care | Source: Ambulatory Visit | Attending: Radiation Oncology | Admitting: Radiation Oncology

## 2023-11-15 DIAGNOSIS — Z5111 Encounter for antineoplastic chemotherapy: Secondary | ICD-10-CM | POA: Diagnosis not present

## 2023-11-15 LAB — RAD ONC ARIA SESSION SUMMARY
Course Elapsed Days: 14
Plan Fractions Treated to Date: 11
Plan Prescribed Dose Per Fraction: 2.67 Gy
Plan Total Fractions Prescribed: 15
Plan Total Prescribed Dose: 40.05 Gy
Reference Point Dosage Given to Date: 29.37 Gy
Reference Point Session Dosage Given: 2.67 Gy
Session Number: 11

## 2023-11-16 ENCOUNTER — Other Ambulatory Visit: Payer: Self-pay

## 2023-11-16 ENCOUNTER — Ambulatory Visit
Admission: RE | Admit: 2023-11-16 | Discharge: 2023-11-16 | Disposition: A | Discharge status: Home / Self Care | Source: Ambulatory Visit | Attending: Radiation Oncology | Admitting: Radiation Oncology

## 2023-11-16 DIAGNOSIS — Z5111 Encounter for antineoplastic chemotherapy: Secondary | ICD-10-CM | POA: Diagnosis not present

## 2023-11-16 LAB — RAD ONC ARIA SESSION SUMMARY
Course Elapsed Days: 15
Plan Fractions Treated to Date: 12
Plan Prescribed Dose Per Fraction: 2.67 Gy
Plan Total Fractions Prescribed: 15
Plan Total Prescribed Dose: 40.05 Gy
Reference Point Dosage Given to Date: 32.04 Gy
Reference Point Session Dosage Given: 2.67 Gy
Session Number: 12

## 2023-11-17 ENCOUNTER — Ambulatory Visit
Admission: RE | Admit: 2023-11-17 | Discharge: 2023-11-17 | Disposition: A | Discharge status: Home / Self Care | Source: Ambulatory Visit | Attending: Radiation Oncology | Admitting: Radiation Oncology

## 2023-11-17 ENCOUNTER — Other Ambulatory Visit: Payer: Self-pay

## 2023-11-17 DIAGNOSIS — Z5111 Encounter for antineoplastic chemotherapy: Secondary | ICD-10-CM | POA: Diagnosis not present

## 2023-11-17 LAB — RAD ONC ARIA SESSION SUMMARY
Course Elapsed Days: 16
Plan Fractions Treated to Date: 13
Plan Prescribed Dose Per Fraction: 2.67 Gy
Plan Total Fractions Prescribed: 15
Plan Total Prescribed Dose: 40.05 Gy
Reference Point Dosage Given to Date: 34.71 Gy
Reference Point Session Dosage Given: 2.67 Gy
Session Number: 13

## 2023-11-20 ENCOUNTER — Ambulatory Visit: Admitting: Radiation Oncology

## 2023-11-20 ENCOUNTER — Ambulatory Visit
Admission: RE | Admit: 2023-11-20 | Discharge: 2023-11-20 | Disposition: A | Discharge status: Home / Self Care | Source: Ambulatory Visit | Attending: Radiation Oncology | Admitting: Radiation Oncology

## 2023-11-20 ENCOUNTER — Ambulatory Visit
Admission: RE | Admit: 2023-11-20 | Discharge: 2023-11-20 | Disposition: A | Source: Ambulatory Visit | Attending: Radiation Oncology

## 2023-11-20 ENCOUNTER — Other Ambulatory Visit: Payer: Self-pay

## 2023-11-20 DIAGNOSIS — Z510 Encounter for antineoplastic radiation therapy: Secondary | ICD-10-CM | POA: Insufficient documentation

## 2023-11-20 DIAGNOSIS — Z79899 Other long term (current) drug therapy: Secondary | ICD-10-CM | POA: Insufficient documentation

## 2023-11-20 DIAGNOSIS — Z5111 Encounter for antineoplastic chemotherapy: Secondary | ICD-10-CM | POA: Insufficient documentation

## 2023-11-20 DIAGNOSIS — D0512 Intraductal carcinoma in situ of left breast: Secondary | ICD-10-CM | POA: Insufficient documentation

## 2023-11-20 DIAGNOSIS — Z171 Estrogen receptor negative status [ER-]: Secondary | ICD-10-CM | POA: Insufficient documentation

## 2023-11-20 DIAGNOSIS — Z5112 Encounter for antineoplastic immunotherapy: Secondary | ICD-10-CM | POA: Insufficient documentation

## 2023-11-20 DIAGNOSIS — G629 Polyneuropathy, unspecified: Secondary | ICD-10-CM | POA: Insufficient documentation

## 2023-11-20 DIAGNOSIS — C50412 Malignant neoplasm of upper-outer quadrant of left female breast: Secondary | ICD-10-CM | POA: Insufficient documentation

## 2023-11-20 DIAGNOSIS — D649 Anemia, unspecified: Secondary | ICD-10-CM | POA: Insufficient documentation

## 2023-11-20 LAB — RAD ONC ARIA SESSION SUMMARY
Course Elapsed Days: 19
Plan Fractions Treated to Date: 14
Plan Prescribed Dose Per Fraction: 2.67 Gy
Plan Total Fractions Prescribed: 15
Plan Total Prescribed Dose: 40.05 Gy
Reference Point Dosage Given to Date: 37.38 Gy
Reference Point Session Dosage Given: 2.67 Gy
Session Number: 14

## 2023-11-21 ENCOUNTER — Ambulatory Visit
Admission: RE | Admit: 2023-11-21 | Discharge: 2023-11-21 | Disposition: A | Source: Ambulatory Visit | Attending: Radiation Oncology

## 2023-11-21 ENCOUNTER — Other Ambulatory Visit: Payer: Self-pay

## 2023-11-21 DIAGNOSIS — Z5112 Encounter for antineoplastic immunotherapy: Secondary | ICD-10-CM | POA: Diagnosis not present

## 2023-11-21 LAB — RAD ONC ARIA SESSION SUMMARY
Course Elapsed Days: 20
Plan Fractions Treated to Date: 15
Plan Prescribed Dose Per Fraction: 2.67 Gy
Plan Total Fractions Prescribed: 15
Plan Total Prescribed Dose: 40.05 Gy
Reference Point Dosage Given to Date: 40.05 Gy
Reference Point Session Dosage Given: 2.67 Gy
Session Number: 15

## 2023-11-22 ENCOUNTER — Other Ambulatory Visit: Payer: Self-pay

## 2023-11-22 ENCOUNTER — Ambulatory Visit
Admission: RE | Admit: 2023-11-22 | Discharge: 2023-11-22 | Disposition: A | Discharge status: Home / Self Care | Source: Ambulatory Visit | Attending: Radiation Oncology | Admitting: Radiation Oncology

## 2023-11-22 DIAGNOSIS — Z5112 Encounter for antineoplastic immunotherapy: Secondary | ICD-10-CM | POA: Diagnosis not present

## 2023-11-22 LAB — RAD ONC ARIA SESSION SUMMARY
Course Elapsed Days: 21
Plan Fractions Treated to Date: 1
Plan Prescribed Dose Per Fraction: 2 Gy
Plan Total Fractions Prescribed: 5
Plan Total Prescribed Dose: 10 Gy
Reference Point Dosage Given to Date: 2 Gy
Reference Point Session Dosage Given: 2 Gy
Session Number: 16

## 2023-11-23 ENCOUNTER — Other Ambulatory Visit: Payer: Self-pay

## 2023-11-23 ENCOUNTER — Ambulatory Visit
Admission: RE | Admit: 2023-11-23 | Discharge: 2023-11-23 | Disposition: A | Discharge status: Home / Self Care | Source: Ambulatory Visit | Attending: Radiation Oncology | Admitting: Radiation Oncology

## 2023-11-23 DIAGNOSIS — Z5112 Encounter for antineoplastic immunotherapy: Secondary | ICD-10-CM | POA: Diagnosis not present

## 2023-11-23 LAB — RAD ONC ARIA SESSION SUMMARY
Course Elapsed Days: 22
Plan Fractions Treated to Date: 2
Plan Prescribed Dose Per Fraction: 2 Gy
Plan Total Fractions Prescribed: 5
Plan Total Prescribed Dose: 10 Gy
Reference Point Dosage Given to Date: 4 Gy
Reference Point Session Dosage Given: 2 Gy
Session Number: 17

## 2023-11-24 ENCOUNTER — Ambulatory Visit
Admission: RE | Admit: 2023-11-24 | Discharge: 2023-11-24 | Disposition: A | Discharge status: Home / Self Care | Source: Ambulatory Visit | Attending: Radiation Oncology | Admitting: Radiation Oncology

## 2023-11-24 ENCOUNTER — Other Ambulatory Visit: Payer: Self-pay

## 2023-11-24 ENCOUNTER — Encounter: Payer: Self-pay | Admitting: Licensed Clinical Social Worker

## 2023-11-24 DIAGNOSIS — Z5112 Encounter for antineoplastic immunotherapy: Secondary | ICD-10-CM | POA: Diagnosis not present

## 2023-11-24 LAB — RAD ONC ARIA SESSION SUMMARY
Course Elapsed Days: 23
Plan Fractions Treated to Date: 3
Plan Prescribed Dose Per Fraction: 2 Gy
Plan Total Fractions Prescribed: 5
Plan Total Prescribed Dose: 10 Gy
Reference Point Dosage Given to Date: 6 Gy
Reference Point Session Dosage Given: 2 Gy
Session Number: 18

## 2023-11-24 NOTE — Progress Notes (Signed)
 Bag provided from [employer].   Damali Broadfoot E Lexine Jaspers, LCSW

## 2023-11-27 ENCOUNTER — Ambulatory Visit: Attending: General Surgery | Admitting: Physical Therapy

## 2023-11-27 ENCOUNTER — Ambulatory Visit
Admission: RE | Admit: 2023-11-27 | Discharge: 2023-11-27 | Disposition: A | Discharge status: Home / Self Care | Source: Ambulatory Visit | Attending: Radiation Oncology | Admitting: Radiation Oncology

## 2023-11-27 ENCOUNTER — Other Ambulatory Visit: Payer: Self-pay

## 2023-11-27 ENCOUNTER — Encounter: Payer: Self-pay | Admitting: *Deleted

## 2023-11-27 ENCOUNTER — Other Ambulatory Visit: Payer: Self-pay | Admitting: Radiation Oncology

## 2023-11-27 DIAGNOSIS — C50412 Malignant neoplasm of upper-outer quadrant of left female breast: Secondary | ICD-10-CM | POA: Insufficient documentation

## 2023-11-27 DIAGNOSIS — Z171 Estrogen receptor negative status [ER-]: Secondary | ICD-10-CM | POA: Insufficient documentation

## 2023-11-27 DIAGNOSIS — Z5112 Encounter for antineoplastic immunotherapy: Secondary | ICD-10-CM | POA: Diagnosis not present

## 2023-11-27 LAB — RAD ONC ARIA SESSION SUMMARY
Course Elapsed Days: 26
Plan Fractions Treated to Date: 4
Plan Prescribed Dose Per Fraction: 2 Gy
Plan Total Fractions Prescribed: 5
Plan Total Prescribed Dose: 10 Gy
Reference Point Dosage Given to Date: 8 Gy
Reference Point Session Dosage Given: 2 Gy
Session Number: 19

## 2023-11-27 NOTE — Therapy (Signed)
 OUTPATIENT PHYSICAL THERAPY SOZO SCREENING NOTE   Patient Name: Mary Campbell MRN: [id number] DOB:06/29/1965, 58 y.o., female Today's Date: 11/27/2023  PCP: Durel Riggs, FNP REFERRING PROVIDER: Curvin Deward MOULD, MD   PT End of Session - 11/27/23 1449     Visit Number 2   unchanged due to screen only   PT Start Time 1446    PT Stop Time 1450    PT Time Calculation (min) 4 min    Activity Tolerance Patient tolerated treatment well    Behavior During Therapy WFL for tasks assessed/performed          Past Medical History:  Diagnosis Date   Anxiety    Arthritis    Breast cancer (HCC)    left   Chronic back pain    COPD (chronic obstructive pulmonary disease) (HCC)    Diabetes mellitus    Difficult intubation    Diverticulitis    Dyspnea    with exertion   Fibromyalgia    Hyperlipidemia    Hypertension    PAD (peripheral artery disease)    a. PV angio 10/2018 - bilateral SFA disease s/p L intervention.   PAD (peripheral artery disease)    PONV (postoperative nausea and vomiting)    Sleep apnea    Tobacco abuse    Past Surgical History:  Procedure Laterality Date   ABDOMINAL AORTOGRAM W/LOWER EXTREMITY Right 10/18/2018   Procedure: ABDOMINAL AORTOGRAM W/LOWER EXTREMITY;  Surgeon: Court Dorn PARAS, MD;  [facility];  Service: Cardiovascular;  Laterality: Right;   ABDOMINAL AORTOGRAM W/LOWER EXTREMITY N/A 04/02/2020   Procedure: ABDOMINAL AORTOGRAM W/LOWER EXTREMITY;  Surgeon: Court Dorn PARAS, MD;  [facility];  Service: Cardiovascular;  Laterality: N/A;   ABDOMINAL AORTOGRAM W/LOWER EXTREMITY N/A 04/23/2020   Procedure: ABDOMINAL AORTOGRAM W/LOWER EXTREMITY;  Surgeon: Court Dorn PARAS, MD;  [facility];  Service: Cardiovascular;  Laterality: N/A;   ABDOMINAL HYSTERECTOMY     BREAST BIOPSY Left 05/31/2023   US  LT BREAST BX W LOC DEV 1ST LESION IMG BX SPEC US  GUIDE 05/31/2023 [facility]MO[facility]APHY   BREAST BIOPSY   06/27/2023   MM LT RADIOACTIVE SEED LOC MAMMO GUIDE 06/27/2023 [facility]AMMO[facility]HY   BREAST LUMPECTOMY WITH RADIOACTIVE SEED AND SENTINEL LYMPH NODE BIOPSY Left 06/29/2023   Procedure: BREAST LUMPECTOMY WITH RADIOACTIVE SEED AND SENTINEL LYMPH NODE BIOPSY;  Surgeon: Curvin Deward MOULD, MD;  [facility];  Service: General;  Laterality: Left;  LEFT BREAST RADIOACTIVE SEED LOCALIZED LUMPECTOMY   SENTINEL NODE BIOPSY   CHOLECYSTECTOMY     PERIPHERAL VASCULAR ATHERECTOMY Left 10/18/2018   Procedure: PERIPHERAL VASCULAR ATHERECTOMY;  Surgeon: Court Dorn PARAS, MD;  [facility];  Service: Cardiovascular;  Laterality: Left;  SFA   PERIPHERAL VASCULAR ATHERECTOMY Right 11/22/2018   Procedure: PERIPHERAL VASCULAR ATHERECTOMY;  Surgeon: Court Dorn PARAS, MD;  Location: [facility] INVASIVE CV LAB;  Service: Cardiovascular;  Laterality: Right;  SFA   PERIPHERAL VASCULAR ATHERECTOMY  04/23/2020   Procedure: PERIPHERAL VASCULAR ATHERECTOMY;  Surgeon: Court Dorn PARAS, MD;  Location: [facility] INVASIVE CV LAB;  Service: Cardiovascular;;   PERIPHERAL VASCULAR BALLOON ANGIOPLASTY Right 11/22/2018   Procedure: PERIPHERAL VASCULAR BALLOON ANGIOPLASTY;  Surgeon: Court Dorn PARAS, MD;  [facility];  Service: Cardiovascular;  Laterality: Right;  SFA   PERIPHERAL VASCULAR BALLOON ANGIOPLASTY  04/23/2020   Procedure: PERIPHERAL VASCULAR BALLOON ANGIOPLASTY;  Surgeon: Court Dorn PARAS, MD;  [facility];  Service: Cardiovascular;;  PERIPHERAL VASCULAR INTERVENTION Left 04/02/2020   Procedure: PERIPHERAL VASCULAR INTERVENTION;  Surgeon: Court Dorn PARAS, MD;  Location: [facility] INVASIVE CV LAB;  Service: Cardiovascular;  Laterality: Left;   PORTACATH PLACEMENT N/A 06/29/2023   Procedure: INSERTION, TUNNELED CENTRAL VENOUS DEVICE, WITH PORT;  Surgeon: Curvin Deward MOULD, MD;  [facility];  Service: General;  Laterality: N/A;  PORT PLACEMENT WITH ULTRASOUND GUIDANCE GEN w/PEC BLOCK   Patient Active  Problem List   Diagnosis Date Noted   Port-A-Cath in place 08/07/2023   Severe obstructive sleep apnea 06/19/2023   Antiplatelet or antithrombotic long-term use 06/15/2023   Positive colorectal cancer screening using Cologuard test 06/15/2023   Malignant neoplasm of upper-outer quadrant of left breast in female, estrogen receptor negative (HCC) 06/05/2023   Excessive daytime sleepiness 12/22/2022   DOE (dyspnea on exertion) 12/22/2022   Centrilobular emphysema (HCC) 12/22/2022   Claudication in peripheral vascular disease 10/04/2018   Essential hypertension 10/04/2018   Hyperlipidemia 10/04/2018   Tobacco abuse 10/04/2018   Family history of heart disease 10/04/2018    REFERRING DIAG: left breast cancer at risk for lymphedema  THERAPY DIAG:  Malignant neoplasm of upper-outer quadrant of left breast in female, estrogen receptor negative (HCC)  PERTINENT HISTORY: Left lumpectomy and SLNB 06/29/23 on due to left grade 2 invasive ductal carcinoma breast cancer. 3 negative nodes removed.  It measured 1.2 cm and is located in the upper outer quadrant. It is ER/PR negative and HER2 positive with a Ki67 of 15%. She is on Plavix  for peripheral artery disease and smokes 1 pack/day. Fibromyalgia pain at baseline. Will have chemotherapy and radiation.    PRECAUTIONS: left UE Lymphedema risk, Fall  SUBJECTIVE: Pt is here for 3 month SOZO screen.   PAIN:  Are you having pain? No  SOZO SCREENING:    L-DEX FLOWSHEETS - 11/27/23 1400       L-DEX LYMPHEDEMA SCREENING   Measurement Type Unilateral    L-DEX MEASUREMENT EXTREMITY Upper Extremity    POSITION  Standing    DOMINANT SIDE Right    At Risk Side Left    BASELINE SCORE (UNILATERAL) 0.7    L-DEX SCORE (UNILATERAL) 1.4    VALUE CHANGE (UNILAT) 0.7          Patient was assessed today using the SOZO machine to determine the lymphedema index score. This was compared to her baseline score. It was determined that she is within the  recommended range when compared to her baseline and no further action is needed at this time. She will continue SOZO screenings. These are done every 3 months for 2 years post operatively followed by every 6 months for 2 years, and then annually.    [employer], PT 11/27/2023, 2:54 PM    P: 3 month SOZO

## 2023-11-27 NOTE — Assessment & Plan Note (Signed)
 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   Anemia: Monitoring closely.  Patient reports that she is taking oral iron. Diarrhea 2-3 loose stools per day.  She takes Imodium as needed Nausea after cycle 2: I added Zofran  to her treatment Severe hyperglycemia from diabetes after treatment secondary to Decadron  (sugar went up to 600s): Decadron  has been discontinued.   Return to clinic every 3 weeks for follow-up on Herceptin  and every 6 weeks for follow-up with me.

## 2023-11-28 ENCOUNTER — Inpatient Hospital Stay

## 2023-11-28 ENCOUNTER — Ambulatory Visit
Admission: RE | Admit: 2023-11-28 | Discharge: 2023-11-28 | Disposition: A | Discharge status: Home / Self Care | Source: Ambulatory Visit | Attending: Radiation Oncology | Admitting: Radiation Oncology

## 2023-11-28 ENCOUNTER — Other Ambulatory Visit: Payer: Self-pay

## 2023-11-28 ENCOUNTER — Inpatient Hospital Stay: Attending: Hematology and Oncology | Admitting: Hematology and Oncology

## 2023-11-28 VITALS — BP 172/60 | HR 76 | Temp 97.7°F | Resp 18 | Ht 64.0 in | Wt 165.9 lb

## 2023-11-28 DIAGNOSIS — D649 Anemia, unspecified: Secondary | ICD-10-CM | POA: Insufficient documentation

## 2023-11-28 DIAGNOSIS — D0512 Intraductal carcinoma in situ of left breast: Secondary | ICD-10-CM | POA: Insufficient documentation

## 2023-11-28 DIAGNOSIS — Z5111 Encounter for antineoplastic chemotherapy: Secondary | ICD-10-CM | POA: Insufficient documentation

## 2023-11-28 DIAGNOSIS — Z171 Estrogen receptor negative status [ER-]: Secondary | ICD-10-CM

## 2023-11-28 DIAGNOSIS — G629 Polyneuropathy, unspecified: Secondary | ICD-10-CM | POA: Insufficient documentation

## 2023-11-28 DIAGNOSIS — C50412 Malignant neoplasm of upper-outer quadrant of left female breast: Secondary | ICD-10-CM

## 2023-11-28 DIAGNOSIS — Z5112 Encounter for antineoplastic immunotherapy: Secondary | ICD-10-CM | POA: Diagnosis not present

## 2023-11-28 DIAGNOSIS — Z79899 Other long term (current) drug therapy: Secondary | ICD-10-CM | POA: Insufficient documentation

## 2023-11-28 DIAGNOSIS — Z95828 Presence of other vascular implants and grafts: Secondary | ICD-10-CM

## 2023-11-28 LAB — CMP (CANCER CENTER ONLY)
ALT: 7 U/L (ref 0–44)
AST: 10 U/L — ABNORMAL LOW (ref 15–41)
Albumin: 3.7 g/dL (ref 3.5–5.0)
Alkaline Phosphatase: 159 U/L — ABNORMAL HIGH (ref 38–126)
Anion gap: 7 (ref 5–15)
BUN: 17 mg/dL (ref 6–20)
CO2: 26 mmol/L (ref 22–32)
Calcium: 8.8 mg/dL — ABNORMAL LOW (ref 8.9–10.3)
Chloride: 105 mmol/L (ref 98–111)
Creatinine: 1.04 mg/dL — ABNORMAL HIGH (ref 0.44–1.00)
GFR, Estimated: >60 mL/min (ref >60)
Glucose, Bld: 145 mg/dL — ABNORMAL HIGH (ref 70–99)
Potassium: 4.1 mmol/L (ref 3.5–5.1)
Sodium: 138 mmol/L (ref 135–145)
Total Bilirubin: 0.2 mg/dL (ref 0.0–1.2)
Total Protein: 7 g/dL (ref 6.5–8.1)

## 2023-11-28 LAB — RAD ONC ARIA SESSION SUMMARY
Course Elapsed Days: 27
Plan Fractions Treated to Date: 5
Plan Prescribed Dose Per Fraction: 2 Gy
Plan Total Fractions Prescribed: 5
Plan Total Prescribed Dose: 10 Gy
Reference Point Dosage Given to Date: 10 Gy
Reference Point Session Dosage Given: 2 Gy
Session Number: 20

## 2023-11-28 LAB — CBC WITH DIFFERENTIAL (CANCER CENTER ONLY)
Abs Immature Granulocytes: 0.02 K/uL (ref 0.00–0.07)
Basophils Absolute: 0.1 K/uL (ref 0.0–0.1)
Basophils Relative: 1 %
Eosinophils Absolute: 0.2 K/uL (ref 0.0–0.5)
Eosinophils Relative: 2 %
HCT: 34.7 % — ABNORMAL LOW (ref 36.0–46.0)
Hemoglobin: 11.4 g/dL — ABNORMAL LOW (ref 12.0–15.0)
Immature Granulocytes: 0 %
Lymphocytes Relative: 13 %
Lymphs Abs: 1.2 K/uL (ref 0.7–4.0)
MCH: 28.6 pg (ref 26.0–34.0)
MCHC: 32.9 g/dL (ref 30.0–36.0)
MCV: 87 fL (ref 80.0–100.0)
Monocytes Absolute: 0.6 K/uL (ref 0.1–1.0)
Monocytes Relative: 7 %
Neutro Abs: 7.1 K/uL (ref 1.7–7.7)
Neutrophils Relative %: 77 %
Platelet Count: 276 K/uL (ref 150–400)
RBC: 3.99 MIL/uL (ref 3.87–5.11)
RDW: 16 % — ABNORMAL HIGH (ref 11.5–15.5)
WBC Count: 9.2 K/uL (ref 4.0–10.5)
nRBC: 0 % (ref 0.0–0.2)

## 2023-11-28 MED ORDER — SODIUM CHLORIDE 0.9 % IV SOLN: INTRAVENOUS | Status: DC

## 2023-11-28 MED ORDER — TRASTUZUMAB-ANNS CHEMO 150 MG IV SOLR
6.0000 mg/kg | Freq: Once | INTRAVENOUS | Status: AC
  Administered 2023-11-28: 483 mg via INTRAVENOUS
  Filled 2023-11-28: qty 23

## 2023-11-28 MED ORDER — ACETAMINOPHEN 325 MG PO TABS
650.0000 mg | ORAL_TABLET | Freq: Once | ORAL | Status: AC
  Administered 2023-11-28: 650 mg via ORAL
  Filled 2023-11-28: qty 2

## 2023-11-28 MED ORDER — SODIUM CHLORIDE 0.9% FLUSH
10.0000 mL | INTRAVENOUS | Status: DC | PRN
  Administered 2023-11-28: 10 mL

## 2023-11-28 MED ORDER — DIPHENHYDRAMINE HCL 25 MG PO CAPS
50.0000 mg | ORAL_CAPSULE | Freq: Once | ORAL | Status: AC
  Administered 2023-11-28: 50 mg via ORAL
  Filled 2023-11-28: qty 2

## 2023-11-28 NOTE — Progress Notes (Signed)
 Patient Care Team: Durel Riggs, FNP as PCP - General (Nurse Practitioner) Barbaraann, Darryle Ned, MD as PCP - Cardiology (Cardiology) Odean Potts, MD as Consulting Physician (Hematology and Oncology) Tyree Nanetta SAILOR, RN as Oncology Nurse Navigator Curvin Deward MOULD, MD as Consulting Physician (General Surgery) Izell Domino, MD as Attending Physician (Radiation Oncology)  DIAGNOSIS:  Encounter Diagnosis  Name Primary?   Malignant neoplasm of upper-outer quadrant of left breast in female, estrogen receptor negative (HCC) Yes    SUMMARY OF ONCOLOGIC HISTORY: Oncology History  Malignant neoplasm of upper-outer quadrant of left breast in female, estrogen receptor negative (HCC)  05/31/2023 Initial Diagnosis   Screening mammogram detected left breast mass and asymmetry retroareolar region, irregular spiculated mass 1.1 cm, axilla negative, ultrasound measured 1.2 cm, biopsy: Grade 2 IDC ER 0% PR 0% Ki67 15%, HER2 3+ positive   06/07/2023 Cancer Staging   Staging form: Breast, AJCC 8th Edition - Clinical: Stage IA (cT1c, cN0, cM0, G2, ER-, PR-, HER2+) - Signed by Odean Potts, MD on 06/07/2023 Stage prefix: Initial diagnosis Histologic grading system: 3 grade system   06/29/2023 Surgery   Left lumpectomy: 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 Goosby is a 58 year old with above-mentioned history of breast cancer is  finished chemotherapy and is now on Herceptin  maintenance.  She is tolerating Herceptin  extremely well without any problems or concerns.      ALLERGIES:  is allergic to codeine, other, sulfonamide derivatives, and lisinopril.  MEDICATIONS:  Current Outpatient Medications  Medication Sig Dispense Refill   acetaminophen  (TYLENOL ) 650 MG CR tablet Take 1,300 mg by mouth in the morning, at noon, and at bedtime.     aspirin  EC 81 MG tablet Take 81 mg by mouth in the morning.     buPROPion (WELLBUTRIN XL) 150 MG 24 hr tablet Take 150 mg by mouth in the morning.     CARDIZEM 60 MG tablet Take 60 mg by mouth daily.     clobetasol cream (TEMOVATE) 0.05 % Apply 1 Application topically 2 (two) times daily as needed (skin irritation).     clopidogrel  (PLAVIX ) 75 MG tablet Take 1 tablet (75 mg total) by mouth daily. 90 tablet 1   cyanocobalamin  (VITAMIN B12) 500 MCG tablet Take 2 tablets (1,000 mcg total) by mouth daily. 60 tablet 3   cyclobenzaprine (FLEXERIL) 5 MG tablet Take 5 mg by mouth 3 (three) times daily.     DULoxetine (CYMBALTA) 30 MG capsule Take 30 mg by mouth in the morning. Total dose 90 mg (30 mg and 60 mg)     DULoxetine (CYMBALTA) 60 MG capsule Take 60 mg by mouth in the morning. Total dose 90 mg (30 mg and 60 mg)     ferrous gluconate  (FERGON) 324 MG tablet Take 1 tablet by mouth once daily with breakfast 30 tablet 0   fluticasone  (FLONASE ) 50 MCG/ACT nasal spray Place 1-2 sprays into  both nostrils daily. 18.2 mL 2   furosemide  (LASIX ) 20 MG tablet Take 1 tab daily for 3 days, then take daily as needed after that (do not take more than 3 days in a row). 30 tablet 0   gabapentin  (NEURONTIN ) 100 MG capsule Take 300 mg by mouth 2 (two) times daily.     JANUMET XR 50-1000 MG TB24 Take 1 tablet by mouth in the morning and at bedtime.     LANTUS  SOLOSTAR 100 UNIT/ML Solostar Pen Inject 50 Units into the skin at bedtime.     lidocaine -prilocaine  (EMLA ) cream APPLY TO THE AFFECTED AREA ONCE  DAILY 30 g 11   ondansetron  (ZOFRAN ) 8 MG tablet TAKE 1 TABLET EVERY 8 HOURS AS NEEDED FOR NAUSEA OR VOMITING 30 tablet 35   ondansetron  (ZOFRAN -ODT) 4 MG disintegrating tablet Take 1 tablet (4 mg total) by mouth every 8 (eight) hours as needed for nausea. 10 tablet 0   potassium chloride  (KLOR-CON ) 10 MEQ tablet Take 1 tablet (10 mEq total) by mouth 2 (two) times daily. 28 tablet 0   prochlorperazine  (COMPAZINE ) 10 MG tablet TAKE 1 TABLET EVERY 6 HOURS AS NEEDED FOR NAUSEA OR VOMITING 30 tablet 44   rosuvastatin  (CRESTOR ) 40 MG tablet Take 1 tablet (40 mg total) by mouth daily. 90 tablet 3   spironolactone (ALDACTONE) 25 MG tablet Take 12.5 mg by mouth. Twice daily     Tiotropium Bromide-Olodaterol (STIOLTO RESPIMAT ) 2.5-2.5 MCG/ACT AERS Inhale 2 puffs into the lungs daily. 4 g 11   traMADol  (ULTRAM ) 50 MG tablet Take 50 mg by mouth every 6 (six) hours as needed.     triamcinolone  (KENALOG ) 0.025 % cream Apply 1 Application topically 2 (two) times daily. 30 g 0   VENTOLIN  HFA 108 (90 Base) MCG/ACT inhaler USE 2 INHALATIONS EVERY 6 HOURS AS NEEDED FOR WHEEZING OR SHORTNESS OF BREATH 54 g 3   Current Facility-Administered Medications  Medication Dose Route Frequency Provider Last Rate Last Admin   sodium chloride  flush (NS) 0.9 % injection 3 mL  3 mL Intravenous Q12H Court Dorn PARAS, MD       sodium chloride  flush (NS) 0.9 % injection 3 mL  3 mL Intravenous Q12H Court Dorn PARAS, MD        PHYSICAL EXAMINATION: ECOG PERFORMANCE STATUS: 1 - Symptomatic but completely ambulatory  Vitals:   11/28/23 0854  BP: (!) 172/60  Pulse: 76  Resp: 18  Temp: 97.7 F (36.5 C)  SpO2: 99%   Filed Weights   11/28/23 0854  Weight: 165 lb 14.4 oz (75.3 kg)      LABORATORY DATA:  I have reviewed the data as listed    Latest Ref Rng & Units 11/28/2023    8:33 AM 11/07/2023    8:38 AM 10/17/2023   10:10 AM  CMP  Glucose 70 - 99 mg/dL 854  858  86   BUN 6 - 20 mg/dL 17  15  10    Creatinine  0.44 - 1.00 mg/dL 8.95  9.13  9.12   Sodium 135 - 145 mmol/L 138  139  138   Potassium 3.5 - 5.1 mmol/L 4.1  3.9  4.2   Chloride 98 - 111 mmol/L 105  106  105   CO2 22 - 32 mmol/L 26  27  28    Calcium  8.9 - 10.3 mg/dL 8.8  8.9  8.8   Total Protein 6.5 - 8.1 g/dL 7.0  6.4  6.8   Total Bilirubin 0.0 - 1.2  mg/dL 0.2  0.3  0.2   Alkaline Phos 38 - 126 U/L 159  141  153   AST 15 - 41 U/L 10  <10  12   ALT 0 - 44 U/L 7  7  7      Lab Results  Component Value Date   WBC 9.2 11/28/2023   HGB 11.4 (L) 11/28/2023   HCT 34.7 (L) 11/28/2023   MCV 87.0 11/28/2023   PLT 276 11/28/2023   NEUTROABS 7.1 11/28/2023    ASSESSMENT & PLAN:  Malignant neoplasm of upper-outer quadrant of left breast in female, estrogen receptor negative (HCC) 06/29/2023: Left lumpectomy: 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   Anemia: Hemoglobin 11.4.  Patient reports that she is taking oral iron. Diarrhea 2-3 loose stools per day.  She takes Imodium as needed Nausea after cycle 2: I added Zofran  to her treatment    Return to clinic every 3 weeks for follow-up on Herceptin  and every 6 weeks for follow-up with me.   No orders of the defined types were placed in this encounter.  The patient has a good understanding of the overall plan. she agrees with it. she will call with any problems that may develop before the next visit here.  I personally spent a  total of 30 minutes in the care of the patient today including preparing to see the patient, getting/reviewing separately obtained history, performing a medically appropriate exam/evaluation, counseling and educating, placing orders, referring and communicating with other health care professionals, documenting clinical information in the EHR, independently interpreting results, communicating results, and coordinating care.   Viinay K Kenyona Rena, MD 11/28/23

## 2023-11-28 NOTE — Patient Instructions (Signed)

## 2023-11-29 ENCOUNTER — Ambulatory Visit: Payer: Self-pay

## 2023-11-29 ENCOUNTER — Other Ambulatory Visit: Payer: Self-pay

## 2023-11-29 DIAGNOSIS — C50412 Malignant neoplasm of upper-outer quadrant of left female breast: Secondary | ICD-10-CM

## 2023-11-29 NOTE — Radiation Completion Notes (Signed)
 Patient Name: Mary Campbell, Mary Campbell MRN: [id number] Date of Birth: 1965/04/08 Referring Physician: DEWARD CURVIN MOULD, M.D. Date of Service: 2023-11-29 Radiation Oncologist: Lauraine Golden, M.D. [facility] [place]                             RADIATION ONCOLOGY END OF TREATMENT NOTE     Diagnosis: C50.412 Malignant neoplasm of upper-outer quadrant of left female breast Staging on 2023-06-29: Malignant neoplasm of upper-outer quadrant of left breast in female, estrogen receptor negative (HCC) T=pT1c, N=pN0, M=cM0 Staging on 2023-06-07: Malignant neoplasm of upper-outer quadrant of left breast in female, estrogen receptor negative (HCC) T=cT1c, N=cN0, M=cM0 Intent: Curative     ==========DELIVERED PLANS==========  First Treatment Date: 2023-11-01 Last Treatment Date: 2023-11-28   Plan Name: Breast_L_BH Site: Breast, Left Technique: 3D Mode: Photon Dose Per Fraction: 2.67 Gy Prescribed Dose (Delivered / Prescribed): 40.05 Gy / 40.05 Gy Prescribed Fxs (Delivered / Prescribed): 15 / 15   Plan Name: Brst_L_Bst_BH Site: Breast, Left Technique: 3D Mode: Photon Dose Per Fraction: 2 Gy Prescribed Dose (Delivered / Prescribed): 10 Gy / 10 Gy Prescribed Fxs (Delivered / Prescribed): 5 / 5     ==========ON TREATMENT VISIT DATES========== 2023-11-06, 2023-11-13, 2023-11-20, 2023-11-27     ==========UPCOMING VISITS========== 02/26/2024 [facility] REH AT [facility] SCREEN Aden Kins A, PTA  02/20/2024 [facility] INFUSION 2HR (120) [facility] INFUSION  02/20/2024 [facility] EST PT 15 Odean Potts, MD  01/30/2024 [facility] INFUSION 2HR (120) [facility] INFUSION  01/09/2024 [facility] INFUSION 2HR (120) [facility] INFUSION  01/09/2024 [facility] EST PT 15 Odean Potts, MD  01/08/2024 [facility] VISIT - LINZIE Neda Jennet LABOR, MD  12/28/2023 [facility] FOLLOW UP 20 Golden Lauraine,  MD  12/19/2023 [facility] PORT FLUSH W/LAB [facility] FLUSH  12/19/2023 [facility] INFUSION 2HR (120) [facility] INFUSION  12/04/2023 [facility] OFFICE VISIT O'Neal, Darryle Ned, MD        ==========APPENDIX - ON TREATMENT VISIT NOTES==========   See weekly On Treatment Notes in Epic for details in the Media tab (listed as Progress notes on the On Treatment Visit Dates listed above).

## 2023-12-03 NOTE — Progress Notes (Unsigned)
 Cardiology Office Note:  .   Date:  12/04/2023  ID:  Mary Campbell, DOB [birth year], MRN [id number] PCP: Durel Riggs, FNP  [facility] Providers Cardiologist:  Darryle ONEIDA Decent, MD    History of Present Illness: .    Chief Complaint  Patient presents with   PAD (peripheral artery disease)   Follow-up    1 year    Mary Campbell is a 57 y.o. female with history of PAD who presents for follow-up.    History of Present Illness   Mary Campbell is a 58 year old female with peripheral artery disease, diabetes, hypertension, hyperlipidemia, and a history of breast cancer who presents for follow-up.  She has a history of peripheral artery disease and has undergone left superficial femoral artery angioplasty in 2020 and 2022. She currently has no issues with her legs, although she recently experienced swelling and was checked for clots. She is on clopidogrel  and spironolactone.  Her breast cancer treatment includes recent completion of radiation therapy, with ongoing chemotherapy every three weeks. She experiences nausea and diarrhea related to chemotherapy. She is also on Herceptin  therapy.  Her diabetes is managed by her primary care doctor, with a recent A1c of 6.4. She is unsure about her cholesterol levels as they have not been checked recently. She is on rosuvastatin  for hyperlipidemia and requires a refill.  Hypertension has been difficult to control, with elevated blood pressure for a couple of months. Her medication has been adjusted multiple times. She is currently on Cardizem 60 mg daily and spironolactone. Recent home measurements show 139/72 mmHg.  She has a history of tobacco abuse but has quit smoking. No chest pain or trouble breathing.          Problem List 1. PAD -angioplasty L SFA 10/19/2018 and 04/02/2020 -R SFA angioplasty  2. DM -A1c 6.4 3. HLD -T chol 112, HDL 29, LDL 56, TG 154 4. Tobacco abuse 5. Obesity 6. Breast CA -ER- PR- HER2+    ROS: All  other ROS reviewed and negative. Pertinent positives noted in the HPI.     Studies Reviewed: SABRA   EKG Interpretation Date/Time:  Monday December 04 2023 08:34:58 EST Ventricular Rate:  82 PR Interval:  158 QRS Duration:  76 QT Interval:  378 QTC Calculation: 441 R Axis:   -12  Text Interpretation: Normal sinus rhythm Septal infarct (cited on or before 19-Dec-2022) Confirmed by Decent Darryle [phone]) on 12/04/2023 8:38:34 AM   TTE 09/28/2023  1. Left ventricular ejection fraction, by estimation, is 70 to 75%. Left  ventricular ejection fraction by 2D MOD biplane is 71.5 %. The left  ventricle has hyperdynamic function. The left ventricle has no regional  wall motion abnormalities. Left  ventricular diastolic parameters are consistent with Grade I diastolic  dysfunction (impaired relaxation). The average left ventricular global  longitudinal strain is -16.7 %.   2. Right ventricular systolic function is normal. The right ventricular  size is normal.   3. The mitral valve is grossly normal. Trivial mitral valve  regurgitation.   4. The aortic valve is tricuspid. Aortic valve regurgitation is not  visualized.   5. The inferior vena cava is normal in size with greater than 50%  respiratory variability, suggesting right atrial pressure of 3 mmHg.   NM Stress 02/01/2023   The study is normal. The study is low risk.   No ST deviation was noted.   LV perfusion is normal. There is no evidence of ischemia. There is no  evidence of infarction.   Left ventricular function is normal. Nuclear stress EF: 59%. The left ventricular ejection fraction is normal (55-65%). End diastolic cavity size is normal. End systolic cavity size is normal. No evidence of transient ischemic dilation (TID) noted.   Prior study available for comparison. No changes compared to prior study. Physical Exam:   VS:  BP (!) 150/74 (BP Location: Right Arm, Patient Position: Sitting, Cuff Size: Normal)   Pulse 82   Resp 16    Ht 5' 4 (1.626 m)   Wt 164 lb 6.4 oz (74.6 kg)   SpO2 97%   BMI 28.22 kg/m    Wt Readings from Last 3 Encounters:  12/04/23 164 lb 6.4 oz (74.6 kg)  11/28/23 165 lb 14.4 oz (75.3 kg)  11/07/23 170 lb 12.8 oz (77.5 kg)    GEN: Well nourished, well developed in no acute distress NECK: No JVD; No carotid bruits CARDIAC: RRR, no murmurs, rubs, gallops RESPIRATORY:  Clear to auscultation without rales, wheezing or rhonchi  ABDOMEN: Soft, non-tender, non-distended EXTREMITIES:  No edema; No deformity  ASSESSMENT AND PLAN: .   Assessment and Plan    Peripheral arterial disease with bilateral superficial femoral artery stenosis Significant stenosis in bilateral superficial femoral arteries, stable condition. - Continue follow-up with Dr. Court for leg artery monitoring. - Discontinued aspirin , continued clopidogrel  monotherapy.  Essential hypertension, suboptimally controlled Blood pressure elevated, current regimen ineffective. - Discontinued Cardizem. - Initiated carvedilol 25 mg twice daily. Continue aldactone 12.5 mg daily.  - Instructed to check blood pressure twice daily and maintain a log. - Scheduled follow-up in six weeks for blood pressure re-evaluation.  Mixed hyperlipidemia Cholesterol levels not recently checked. - Ordered lipid panel today. - Continue rosuvastatin  40 mg daily.  Type 2 diabetes mellitus, controlled Recent A1c 6.4%, indicating good control.  Breast cancer on active chemotherapy and trastuzumab  therapy Undergoing chemotherapy with trastuzumab , mild nausea and diarrhea, monitoring cardiac effects. - Recommended periodic echocardiograms every six months to monitor cardiac function.              Follow-up: Return in about 6 weeks (around 01/15/2024).  Signed, Darryle DASEN. Barbaraann, MD, [facility]  [facility]  [address] [place], [place] [address] [phone]  9:00 AM

## 2023-12-04 ENCOUNTER — Encounter: Payer: Self-pay | Admitting: *Deleted

## 2023-12-04 ENCOUNTER — Ambulatory Visit: Attending: Cardiovascular Disease | Admitting: Cardiovascular Disease

## 2023-12-04 ENCOUNTER — Encounter: Payer: Self-pay | Admitting: Cardiovascular Disease

## 2023-12-04 VITALS — BP 150/74 | HR 82 | Resp 16 | Ht 64.0 in | Wt 164.4 lb

## 2023-12-04 DIAGNOSIS — I10 Essential (primary) hypertension: Secondary | ICD-10-CM | POA: Diagnosis not present

## 2023-12-04 DIAGNOSIS — Z720 Tobacco use: Secondary | ICD-10-CM | POA: Insufficient documentation

## 2023-12-04 DIAGNOSIS — E782 Mixed hyperlipidemia: Secondary | ICD-10-CM | POA: Insufficient documentation

## 2023-12-04 DIAGNOSIS — I739 Peripheral vascular disease, unspecified: Secondary | ICD-10-CM | POA: Insufficient documentation

## 2023-12-04 MED ORDER — ROSUVASTATIN CALCIUM 40 MG PO TABS
40.0000 mg | ORAL_TABLET | Freq: Every day | ORAL | 3 refills | Status: AC
Start: 2023-12-04 — End: ?

## 2023-12-04 MED ORDER — CARVEDILOL 25 MG PO TABS: 25.0000 mg | ORAL_TABLET | Freq: Two times a day (BID) | ORAL | 3 refills | Status: AC

## 2023-12-04 NOTE — Patient Instructions (Addendum)
 Medication Instructions:    Start Carvdeilol 25 mg twice a day    Stop Diltiazem   Stop Aspirin  *If you need a refill on your cardiac medications before your next appointment, please call your pharmacy*   Lab Work:  If you have labs (blood work) drawn today and your tests are completely normal, you will receive your results only by: MyChart Message (if you have MyChart) OR A paper copy in the mail If you have any lab test that is abnormal or we need to change your treatment, we will call you to review the results.   Testing/Procedures:  Not needed  Follow-Up: At [facility], you and your health needs are our priority.  As part of our continuing mission to provide you with exceptional heart care, we have created designated Provider Care Teams.  These Care Teams include your primary Cardiologist (physician) and Advanced Practice Providers (APPs -  Physician Assistants and Nurse Practitioners) who all work together to provide you with the care you need, when you need it.     Your next appointment:   6 week(s)  The format for your next appointment:   In Person  Provider:   Jon Hails, PA-C, Aline Door, PA-C, Damien Braver, NP, or Katlyn West, NP      Then, Darryle ONEIDA Decent, MD will plan to see you again in 12 month(s).   Other Instructions    Check your blood pressure  daily and bring information to your next appt.

## 2023-12-05 ENCOUNTER — Ambulatory Visit: Payer: Self-pay | Admitting: Cardiovascular Disease

## 2023-12-05 LAB — LIPID PANEL
Chol/HDL Ratio: 3.6 ratio (ref 0.0–4.4)
Cholesterol, Total: 105 mg/dL (ref 100–199)
HDL: 29 mg/dL — ABNORMAL LOW (ref >39)
LDL Chol Calc (NIH): 45 mg/dL (ref 0–99)
Triglycerides: 189 mg/dL — ABNORMAL HIGH (ref 0–149)
VLDL Cholesterol Cal: 31 mg/dL (ref 5–40)

## 2023-12-08 ENCOUNTER — Encounter: Payer: Self-pay | Admitting: Licensed Clinical Social Worker

## 2023-12-08 NOTE — Progress Notes (Signed)
 [facility] CSW Progress Note  Clinical Social Worker attempted to contact patient by phone to follow-up on assistance application.  LVM notifying pt that she was approved for assistance through [name].    Desirie Minteer E Anjanette Gilkey, LCSW Clinical Social Worker [employer]

## 2023-12-19 ENCOUNTER — Inpatient Hospital Stay: Attending: Hematology and Oncology

## 2023-12-19 ENCOUNTER — Inpatient Hospital Stay

## 2023-12-19 VITALS — BP 112/53 | HR 64 | Temp 97.7°F | Resp 16 | Wt 170.5 lb

## 2023-12-19 DIAGNOSIS — F17210 Nicotine dependence, cigarettes, uncomplicated: Secondary | ICD-10-CM | POA: Diagnosis not present

## 2023-12-19 DIAGNOSIS — Z5111 Encounter for antineoplastic chemotherapy: Secondary | ICD-10-CM | POA: Insufficient documentation

## 2023-12-19 DIAGNOSIS — Z171 Estrogen receptor negative status [ER-]: Secondary | ICD-10-CM

## 2023-12-19 DIAGNOSIS — Z79899 Other long term (current) drug therapy: Secondary | ICD-10-CM | POA: Insufficient documentation

## 2023-12-19 DIAGNOSIS — C50412 Malignant neoplasm of upper-outer quadrant of left female breast: Secondary | ICD-10-CM | POA: Diagnosis present

## 2023-12-19 LAB — CMP (CANCER CENTER ONLY)
ALT: 7 U/L (ref 0–44)
AST: 14 U/L — ABNORMAL LOW (ref 15–41)
Albumin: 3.8 g/dL (ref 3.5–5.0)
Alkaline Phosphatase: 174 U/L — ABNORMAL HIGH (ref 38–126)
Anion gap: 10 (ref 5–15)
BUN: 16 mg/dL (ref 6–20)
CO2: 24 mmol/L (ref 22–32)
Calcium: 8.7 mg/dL — ABNORMAL LOW (ref 8.9–10.3)
Chloride: 104 mmol/L (ref 98–111)
Creatinine: 1.06 mg/dL — ABNORMAL HIGH (ref 0.44–1.00)
GFR, Estimated: 60 mL/min — ABNORMAL LOW (ref >60)
Glucose, Bld: 184 mg/dL — ABNORMAL HIGH (ref 70–99)
Potassium: 3.8 mmol/L (ref 3.5–5.1)
Sodium: 138 mmol/L (ref 135–145)
Total Bilirubin: 0.2 mg/dL (ref 0.0–1.2)
Total Protein: 6.7 g/dL (ref 6.5–8.1)

## 2023-12-19 LAB — CBC WITH DIFFERENTIAL (CANCER CENTER ONLY)
Abs Immature Granulocytes: 0.02 K/uL (ref 0.00–0.07)
Basophils Absolute: 0.1 K/uL (ref 0.0–0.1)
Basophils Relative: 1 %
Eosinophils Absolute: 0.2 K/uL (ref 0.0–0.5)
Eosinophils Relative: 2 %
HCT: 30.2 % — ABNORMAL LOW (ref 36.0–46.0)
Hemoglobin: 10.1 g/dL — ABNORMAL LOW (ref 12.0–15.0)
Immature Granulocytes: 0 %
Lymphocytes Relative: 23 %
Lymphs Abs: 1.8 K/uL (ref 0.7–4.0)
MCH: 28.8 pg (ref 26.0–34.0)
MCHC: 33.4 g/dL (ref 30.0–36.0)
MCV: 86 fL (ref 80.0–100.0)
Monocytes Absolute: 0.5 K/uL (ref 0.1–1.0)
Monocytes Relative: 6 %
Neutro Abs: 5.4 K/uL (ref 1.7–7.7)
Neutrophils Relative %: 68 %
Platelet Count: 214 K/uL (ref 150–400)
RBC: 3.51 MIL/uL — ABNORMAL LOW (ref 3.87–5.11)
RDW: 16 % — ABNORMAL HIGH (ref 11.5–15.5)
WBC Count: 8 K/uL (ref 4.0–10.5)
nRBC: 0 % (ref 0.0–0.2)

## 2023-12-19 MED ORDER — ACETAMINOPHEN 325 MG PO TABS
650.0000 mg | ORAL_TABLET | Freq: Once | ORAL | Status: AC
  Administered 2023-12-19: 650 mg via ORAL
  Filled 2023-12-19: qty 2

## 2023-12-19 MED ORDER — SODIUM CHLORIDE 0.9% FLUSH
10.0000 mL | INTRAVENOUS | Status: DC | PRN
  Administered 2023-12-19: 10 mL

## 2023-12-19 MED ORDER — SODIUM CHLORIDE 0.9 % IV SOLN: INTRAVENOUS | Status: DC

## 2023-12-19 MED ORDER — TRASTUZUMAB-ANNS CHEMO 150 MG IV SOLR
6.0000 mg/kg | Freq: Once | INTRAVENOUS | Status: AC
  Administered 2023-12-19: 483 mg via INTRAVENOUS
  Filled 2023-12-19: qty 23

## 2023-12-19 MED ORDER — DIPHENHYDRAMINE HCL 25 MG PO CAPS
50.0000 mg | ORAL_CAPSULE | Freq: Once | ORAL | Status: AC
  Administered 2023-12-19: 50 mg via ORAL
  Filled 2023-12-19: qty 2

## 2023-12-19 NOTE — Patient Instructions (Signed)

## 2023-12-21 NOTE — Telephone Encounter (Signed)
 NFN

## 2023-12-26 NOTE — Progress Notes (Signed)
 Mary Campbell presents today for follow-up after completing radiation to her {breast; right/left/both:19603} on *date*  Pain: Skin:  ROM:  Lymphedema:  MedOnc F/U:  Other issues of note:   Pt reports Yes No Comments  Tamoxifen []  []    Letrozole []  []    Anastrazole []  []    Mammogram []  Date:  [] 

## 2023-12-27 ENCOUNTER — Other Ambulatory Visit: Payer: Self-pay | Admitting: *Deleted

## 2023-12-27 DIAGNOSIS — Z171 Estrogen receptor negative status [ER-]: Secondary | ICD-10-CM

## 2023-12-27 DIAGNOSIS — Z5181 Encounter for therapeutic drug level monitoring: Secondary | ICD-10-CM

## 2023-12-27 NOTE — Progress Notes (Signed)
 Received call from pt with complaint of decrease urine output x several days as well as lower extremity swelling x several days.  Pt states she is currently on spirolactone which is not helping with the lower extremity swelling.  Pt also states she is concerned for her kidney function with a decrease in urine output. RN reviewed with NP who states pt needs repeat echo, lab work and [facility] visit.  Depending on findings, pt may need to be referred to Dr. Odis Brownie at heart failure clinic.  Orders placed, appts scheduled, pt notified and verbalized understanding.

## 2023-12-28 ENCOUNTER — Ambulatory Visit
Admission: RE | Admit: 2023-12-28 | Discharge: 2023-12-28 | Disposition: A | Discharge status: Home / Self Care | Source: Ambulatory Visit | Attending: Radiation Oncology | Admitting: Radiation Oncology

## 2023-12-28 ENCOUNTER — Inpatient Hospital Stay

## 2023-12-28 ENCOUNTER — Other Ambulatory Visit (HOSPITAL_BASED_OUTPATIENT_CLINIC_OR_DEPARTMENT_OTHER): Payer: Self-pay

## 2023-12-28 ENCOUNTER — Encounter: Payer: Self-pay | Admitting: Hematology and Oncology

## 2023-12-28 ENCOUNTER — Other Ambulatory Visit: Payer: Self-pay

## 2023-12-28 ENCOUNTER — Ambulatory Visit (HOSPITAL_COMMUNITY)
Admission: RE | Admit: 2023-12-28 | Discharge: 2023-12-28 | Disposition: A | Discharge status: Home / Self Care | Source: Ambulatory Visit | Attending: Physician Assistant | Admitting: Physician Assistant

## 2023-12-28 ENCOUNTER — Inpatient Hospital Stay (HOSPITAL_BASED_OUTPATIENT_CLINIC_OR_DEPARTMENT_OTHER): Admitting: Physician Assistant

## 2023-12-28 ENCOUNTER — Other Ambulatory Visit (HOSPITAL_COMMUNITY): Payer: Self-pay

## 2023-12-28 VITALS — BP 165/63 | HR 63 | Temp 98.5°F | Resp 20 | Wt 179.4 lb

## 2023-12-28 DIAGNOSIS — R609 Edema, unspecified: Secondary | ICD-10-CM | POA: Insufficient documentation

## 2023-12-28 DIAGNOSIS — Z171 Estrogen receptor negative status [ER-]: Secondary | ICD-10-CM

## 2023-12-28 DIAGNOSIS — Z5111 Encounter for antineoplastic chemotherapy: Secondary | ICD-10-CM | POA: Diagnosis not present

## 2023-12-28 DIAGNOSIS — C50412 Malignant neoplasm of upper-outer quadrant of left female breast: Secondary | ICD-10-CM

## 2023-12-28 LAB — CBC WITH DIFFERENTIAL (CANCER CENTER ONLY)
Abs Immature Granulocytes: 0.02 K/uL (ref 0.00–0.07)
Basophils Absolute: 0.1 K/uL (ref 0.0–0.1)
Basophils Relative: 1 %
Eosinophils Absolute: 0.2 K/uL (ref 0.0–0.5)
Eosinophils Relative: 2 %
HCT: 29.2 % — ABNORMAL LOW (ref 36.0–46.0)
Hemoglobin: 9.4 g/dL — ABNORMAL LOW (ref 12.0–15.0)
Immature Granulocytes: 0 %
Lymphocytes Relative: 19 %
Lymphs Abs: 1.5 K/uL (ref 0.7–4.0)
MCH: 28.2 pg (ref 26.0–34.0)
MCHC: 32.2 g/dL (ref 30.0–36.0)
MCV: 87.7 fL (ref 80.0–100.0)
Monocytes Absolute: 0.5 K/uL (ref 0.1–1.0)
Monocytes Relative: 7 %
Neutro Abs: 5.9 K/uL (ref 1.7–7.7)
Neutrophils Relative %: 71 %
Platelet Count: 226 K/uL (ref 150–400)
RBC: 3.33 MIL/uL — ABNORMAL LOW (ref 3.87–5.11)
RDW: 16.2 % — ABNORMAL HIGH (ref 11.5–15.5)
WBC Count: 8.2 K/uL (ref 4.0–10.5)
nRBC: 0 % (ref 0.0–0.2)

## 2023-12-28 LAB — CMP (CANCER CENTER ONLY)
ALT: <5 U/L (ref 0–44)
AST: 11 U/L — ABNORMAL LOW (ref 15–41)
Albumin: 3.8 g/dL (ref 3.5–5.0)
Alkaline Phosphatase: 161 U/L — ABNORMAL HIGH (ref 38–126)
Anion gap: 10 (ref 5–15)
BUN: 11 mg/dL (ref 6–20)
CO2: 23 mmol/L (ref 22–32)
Calcium: 9.1 mg/dL (ref 8.9–10.3)
Chloride: 106 mmol/L (ref 98–111)
Creatinine: 1.24 mg/dL — ABNORMAL HIGH (ref 0.44–1.00)
GFR, Estimated: 50 mL/min — ABNORMAL LOW (ref >60)
Glucose, Bld: 171 mg/dL — ABNORMAL HIGH (ref 70–99)
Potassium: 4.2 mmol/L (ref 3.5–5.1)
Sodium: 139 mmol/L (ref 135–145)
Total Bilirubin: 0.2 mg/dL (ref 0.0–1.2)
Total Protein: 6.7 g/dL (ref 6.5–8.1)

## 2023-12-28 LAB — PRO BRAIN NATRIURETIC PEPTIDE: Pro Brain Natriuretic Peptide: 151 pg/mL (ref <300.0)

## 2023-12-28 LAB — MAGNESIUM: Magnesium: 1.5 mg/dL — ABNORMAL LOW (ref 1.7–2.4)

## 2023-12-28 MED ORDER — FUROSEMIDE 20 MG PO TABS
ORAL_TABLET | ORAL | 0 refills | Status: AC
  Filled 2023-12-28: qty 14, 14d supply, fill #0

## 2023-12-28 MED ORDER — FUROSEMIDE 10 MG/ML IJ SOLN
20.0000 mg | Freq: Once | INTRAMUSCULAR | Status: AC
  Administered 2023-12-28: 20 mg via INTRAVENOUS
  Filled 2023-12-28: qty 2

## 2023-12-28 MED ORDER — MAGNESIUM OXIDE -MG SUPPLEMENT 400 (240 MG) MG PO TABS
400.0000 mg | ORAL_TABLET | Freq: Every day | ORAL | 0 refills | Status: AC
  Filled 2023-12-28: qty 7, 7d supply, fill #0

## 2023-12-28 NOTE — Progress Notes (Signed)
 Symptom Management Consult Note [facility]    Patient Care Team: Durel Riggs, FNP as PCP - General (Nurse Practitioner) Barbaraann, Darryle Ned, MD as PCP - Cardiology (Cardiology) Odean Potts, MD as Consulting Physician (Hematology and Oncology) Tyree Nanetta SAILOR, RN as Oncology Nurse Navigator Curvin Deward MOULD, MD as Consulting Physician (General Surgery) Izell Domino, MD as Attending Physician (Radiation Oncology)    Name / MRN / DOB: Mary Campbell  [id number]  1965-09-15   Date of visit: 12/28/2023   Chief Complaint/Reason for visit: leg swelling   Current Therapy: Kanjiniti  Last treatment:  Day 1   Cycle 7 on 12/19/23    ASSESSMENT AND PLAN Patient is a 58 y.o. female with oncologic history of malignant neoplasm of upper-outer quadrant of left breast in female, estrogen receptor negative followed by Dr. Odean.  I have viewed most recent oncology note and lab work.  #Malignant neoplasm of upper-outer quadrant of left breast in female, estrogen receptor negative  - Next appointment with oncologist is 01/09/24   #Bilateral lower extremity edema - Chart review shows patient last saw cardiologist on 12/04/23 with plan for follow up in 6 weeks.  - Echo was ordered by medical oncology team and is scheduled for 01/01/24. - Suspect acute on chronic edema due to vascular insufficiency, also considering heart failure, medication side effects (gabapentin  dose increased x 2 months ago), and anemia. Recent weight gain and decreased urine output suggest fluid retention. - Administered 20 mg IV furosemide  in clinic. Chest radiograph is negative for pulmonary edema or pleural effusion. BNP is WNL. Creatinine slightly elevated today at 1.2, baseline around 0.8 per chart review. Will be rechecked at next visit. - Prescribed oral furosemide : one tablet daily for three days, then as needed, not to exceed three consecutive days. - Instructed her to weigh daily and  record weights.  - Advised leg elevation and balancing activity with rest. Recommended compression socks and provided education on their use. - Discussed pausing furosemide  if swelling resolves with option to restart if edema recurs. - Reinforced importance of follow-up with cardiology and upcoming echocardiogram.  #Anemia  - Likely secondary to cancer treatment. Hemoglobin today at 9.4 g/dL, Asymptomatic from anemia, no active bleeding. Observation appropriate as hemoglobin is above transfusion threshold. - Plan to continue monitoring hemoglobin with future laboratory assessments.   #Hypomagnesemia - Mild at 1.5 today. Will prescribe 1 week of PO magnesium for replacement.   Strict ED precautions discussed should symptoms worsen.   HEME/ONC HISTORY Oncology History  Malignant neoplasm of upper-outer quadrant of left breast in female, estrogen receptor negative (HCC)  05/31/2023 Initial Diagnosis   Screening mammogram detected left breast mass and asymmetry retroareolar region, irregular spiculated mass 1.1 cm, axilla negative, ultrasound measured 1.2 cm, biopsy: Grade 2 IDC ER 0% PR 0% Ki67 15%, HER2 3+ positive   06/07/2023 Cancer Staging   Staging form: Breast, AJCC 8th Edition - Clinical: Stage IA (cT1c, cN0, cM0, G2, ER-, PR-, HER2+) - Signed by Odean Potts, MD on 06/07/2023 Stage prefix: Initial diagnosis Histologic grading system: 3 grade system   06/29/2023 Surgery   Left lumpectomy: 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, margins negative, 0/3 lymph nodes negative, ER 0%, PR 0%, HER2 3+ positive, Ki-67 15%    06/29/2023 Cancer Staging   Staging form: Breast, AJCC 8th Edition - Pathologic stage from 06/29/2023: Stage IA (pT1c, pN0, cM0, G2, ER-, PR-, HER2+) - Signed by Crawford Morna Pickle, NP on  09/11/2023 Stage prefix: Initial diagnosis Histologic grading system: 3 grade system   08/07/2023 -  Chemotherapy   Patient is on Treatment Plan : BREAST Paclitaxel  +  Trastuzumab  q7d / Trastuzumab  q21d         INTERVAL HISTORY  Discussed the use of AI scribe software for clinical note transcription with the patient, who gave verbal consent to proceed.    Mary Campbell is a 58 y.o. female with oncologic history as above presenting to [facility] today with chief complaint of leg swelling. Patient presents unaccompanied to visit today.  She has experienced progressive bilateral lower extremity edema over several months, initially affecting the right leg and more recently involving the left. Right leg swelling is worse in the evening, associated with stiffness and burning sensation, and extends up to the knee. Left leg swelling began approximately one month ago, improved transiently with spironolactone, but recurred after discontinuation; spironolactone was restarted in November buy cardiologist and is currently continued. Edema fluctuates, with some improvement overnight. She denies pain but describes tightness and difficulty with mobility when swollen.  She reports increased fatigue and a sense of being drained over the past two weeks, limiting her ability to perform daily activities. She requires frequent rest. She has had recent weight gain and decreased urine output over the past few days. Mild exertional dyspnea has developed within the past month. She denies orthopnea, abdominal pain, or ascites, but reports bloating and intermittent diarrhea over the past two weeks.  Her diet is low in sodium, consisting of home-cooked meals, and she avoids processed and fried foods. She takes gabapentin  for lumbar nerve pain, with a dose increase x 2 months ago. She has previously received short courses of furosemide  for edema, which provided improvement, but has never been on long-term therapy.  She is followed by vascular specialists for femoral artery stenosis and undergoes periodic vascular imaging. Cardiology follow-up is ongoing, with a recent visit in November and a  follow-up scheduled for 01/15/24. She denies current bleeding, hematuria, hematochezia, gingival bleeding, or easy bruising. She has not required blood transfusion.She denies recent infections or fevers. She previously took magnesium supplements but is not currently taking them.       ROS  All other systems are reviewed and are negative for acute change except as noted in the HPI.    Allergies[1]   Past Medical History:  Diagnosis Date   Anxiety    Arthritis    Breast cancer (HCC)    left   Chronic back pain    COPD (chronic obstructive pulmonary disease) (HCC)    Diabetes mellitus    Difficult intubation    Diverticulitis    Dyspnea    with exertion   Fibromyalgia    Hyperlipidemia    Hypertension    PAD (peripheral artery disease)    a. PV angio 10/2018 - bilateral SFA disease s/p L intervention.   PAD (peripheral artery disease)    PONV (postoperative nausea and vomiting)    Sleep apnea    Tobacco abuse      Past Surgical History:  Procedure Laterality Date   ABDOMINAL AORTOGRAM W/LOWER EXTREMITY Right 10/18/2018   Procedure: ABDOMINAL AORTOGRAM W/LOWER EXTREMITY;  Surgeon: Court Dorn PARAS, MD;  Location: [facility];  Service: Cardiovascular;  Laterality: Right;   ABDOMINAL AORTOGRAM W/LOWER EXTREMITY N/A 04/02/2020   Procedure: ABDOMINAL AORTOGRAM W/LOWER EXTREMITY;  Surgeon: Court Dorn PARAS, MD;  Location: [facility];  Service: Cardiovascular;  Laterality: N/A;   ABDOMINAL  AORTOGRAM W/LOWER EXTREMITY N/A 04/23/2020   Procedure: ABDOMINAL AORTOGRAM W/LOWER EXTREMITY;  Surgeon: Court Dorn PARAS, MD;  Location: [facility] INVASIVE CV LAB;  Service: Cardiovascular;  Laterality: N/A;   ABDOMINAL HYSTERECTOMY     BREAST BIOPSY Left 05/31/2023   US  LT BREAST BX W LOC DEV 1ST LESION IMG BX SPEC US  GUIDE 05/31/2023 [facility] MAMMOGRAPHY   BREAST BIOPSY  06/27/2023   MM LT RADIOACTIVE SEED LOC MAMMO GUIDE 06/27/2023 [facility] MAMMOGRAPHY   BREAST LUMPECTOMY WITH  RADIOACTIVE SEED AND SENTINEL LYMPH NODE BIOPSY Left 06/29/2023   Procedure: BREAST LUMPECTOMY WITH RADIOACTIVE SEED AND SENTINEL LYMPH NODE BIOPSY;  Surgeon: Curvin Deward MOULD, MD;  Location: [facility];  Service: General;  Laterality: Left;  LEFT BREAST RADIOACTIVE SEED LOCALIZED LUMPECTOMY   SENTINEL NODE BIOPSY   CHOLECYSTECTOMY     PERIPHERAL VASCULAR ATHERECTOMY Left 10/18/2018   Procedure: PERIPHERAL VASCULAR ATHERECTOMY;  Surgeon: Court Dorn PARAS, MD;  Location: [facility];  Service: Cardiovascular;  Laterality: Left;  SFA   PERIPHERAL VASCULAR ATHERECTOMY Right 11/22/2018   Procedure: PERIPHERAL VASCULAR ATHERECTOMY;  Surgeon: Court Dorn PARAS, MD;  Location: [facility] INVASIVE CV LAB;  Service: Cardiovascular;  Laterality: Right;  SFA   PERIPHERAL VASCULAR ATHERECTOMY  04/23/2020   Procedure: PERIPHERAL VASCULAR ATHERECTOMY;  Surgeon: Court Dorn PARAS, MD;  Location: [facility];  Service: Cardiovascular;;   PERIPHERAL VASCULAR BALLOON ANGIOPLASTY Right 11/22/2018   Procedure: PERIPHERAL VASCULAR BALLOON ANGIOPLASTY;  Surgeon: Court Dorn PARAS, MD;  Location: [facility];  Service: Cardiovascular;  Laterality: Right;  SFA   PERIPHERAL VASCULAR BALLOON ANGIOPLASTY  04/23/2020   Procedure: PERIPHERAL VASCULAR BALLOON ANGIOPLASTY;  Surgeon: Court Dorn PARAS, MD;  Location: [facility];  Service: Cardiovascular;;   PERIPHERAL VASCULAR INTERVENTION Left 04/02/2020   Procedure: PERIPHERAL VASCULAR INTERVENTION;  Surgeon: Court Dorn PARAS, MD;  Location: [facility];  Service: Cardiovascular;  Laterality: Left;   PORTACATH PLACEMENT N/A 06/29/2023   Procedure: INSERTION, TUNNELED CENTRAL VENOUS DEVICE, WITH PORT;  Surgeon: Curvin Deward MOULD, MD;  Location: [facility] OR;  Service: General;  Laterality: N/A;  PORT PLACEMENT WITH ULTRASOUND GUIDANCE GEN w/PEC BLOCK    Social History   Socioeconomic History   Marital status: Legally Separated    Spouse name: Not on file   Number of  children: 2   Years of education: Not on file   Highest education level: Not on file  Occupational History   Occupation: disabled  Tobacco Use   Smoking status: Some Days    Current packs/day: 0.00    Average packs/day: 1.0 packs/day    Types: Cigarettes    Last attempt to quit: 08/11/2022    Years since quitting: 1.3   Smokeless tobacco: Never   Tobacco comments:    Trying quit with patch  Vaping Use   Vaping status: Never Used  Substance and Sexual Activity   Alcohol use: No   Drug use: No   Sexual activity: Yes    Birth control/protection: Surgical  Other Topics Concern   Not on file  Social History Narrative   Not on file   Social Drivers of Health   Tobacco Use: High Risk (12/04/2023)   Patient History    Smoking Tobacco Use: Some Days    Smokeless Tobacco Use: Never    Passive Exposure: Not on file  Financial Resource Strain: Not on file  Food Insecurity: Food Insecurity Present (11/24/2023)   Epic    Worried About Radiation Protection Practitioner of  Food in the Last Year: Sometimes true    Ran Out of Food in the Last Year: Sometimes true  Transportation Needs: No Transportation Needs (10/24/2023)   Epic    Lack of Transportation (Medical): No    Lack of Transportation (Non-Medical): No  Physical Activity: Not on file  Stress: Not on file  Social Connections: Not on file  Intimate Partner Violence: Not At Risk (10/24/2023)   Epic    Fear of Current or Ex-Partner: No    Emotionally Abused: No    Physically Abused: No    Sexually Abused: No  Depression (PHQ2-9): Low Risk (12/28/2023)   Depression (PHQ2-9)    PHQ-2 Score: 0  Alcohol Screen: Not on file  Housing: Low Risk (10/24/2023)   Epic    Unable to Pay for Housing in the Last Year: No    Number of Times Moved in the Last Year: 0    Homeless in the Last Year: No  Utilities: At Risk (11/09/2023)   Epic    Threatened with loss of utilities: Yes  Health Literacy: Not on file    Family History  Problem Relation Age of  Onset   Diabetes Mother    Hypertension Mother    Hyperlipidemia Mother    Heart attack Father    Heart disease Father    Hypertension Father    Hyperlipidemia Father    Breast cancer Neg Hx    Colon cancer Neg Hx    Esophageal cancer Neg Hx     Current Medications[2]  PHYSICAL EXAM ECOG FS:1 - Symptomatic but completely ambulatory    Vitals:   12/28/23 0850  BP: (!) 165/63  Pulse: 63  Resp: 20  Temp: 98.5 F (36.9 C)  TempSrc: Oral  SpO2: 100%  Weight: 179 lb 6 oz (81.4 kg)   Physical Exam Vitals and nursing note reviewed.  Constitutional:      Appearance: She is not ill-appearing or toxic-appearing.  HENT:     Head: Normocephalic.  Eyes:     Conjunctiva/sclera: Conjunctivae normal.  Cardiovascular:     Rate and Rhythm: Normal rate and regular rhythm.     Pulses: Normal pulses.     Heart sounds: Normal heart sounds.  Pulmonary:     Effort: Pulmonary effort is normal.     Breath sounds: Normal breath sounds.  Abdominal:     General: There is no distension.  Musculoskeletal:     Cervical back: Normal range of motion.     Right lower leg: 1+ Pitting Edema present.     Left lower leg: 1+ Pitting Edema present.     Comments: No tenderness to palpation of bilateral calves  Skin:    General: Skin is warm and dry.     Comments: Equal tactile temperature to bilateral lower extremities.   Neurological:     Mental Status: She is alert.        LABORATORY DATA I have reviewed the data as listed    Latest Ref Rng & Units 12/28/2023    8:30 AM 12/19/2023    8:27 AM 11/28/2023    8:33 AM  CBC  WBC 4.0 - 10.5 K/uL 8.2  8.0  9.2   Hemoglobin 12.0 - 15.0 g/dL 9.4  89.8  88.5   Hematocrit 36.0 - 46.0 % 29.2  30.2  34.7   Platelets 150 - 400 K/uL 226  214  276         Latest Ref Rng & Units 12/28/2023  8:30 AM 12/19/2023    8:27 AM 11/28/2023    8:33 AM  CMP  Glucose 70 - 99 mg/dL 828  815  854   BUN 6 - 20 mg/dL 11  16  17    Creatinine 0.44 - 1.00  mg/dL 8.75  8.93  8.95   Sodium 135 - 145 mmol/L 139  138  138   Potassium 3.5 - 5.1 mmol/L 4.2  3.8  4.1   Chloride 98 - 111 mmol/L 106  104  105   CO2 22 - 32 mmol/L 23  24  26    Calcium  8.9 - 10.3 mg/dL 9.1  8.7  8.8   Total Protein 6.5 - 8.1 g/dL 6.7  6.7  7.0   Total Bilirubin 0.0 - 1.2 mg/dL 0.2  0.2  0.2   Alkaline Phos 38 - 126 U/L 161  174  159   AST 15 - 41 U/L 11  14  10    ALT 0 - 44 U/L 5  7  7         RADIOGRAPHIC STUDIES (from last 24 hours if applicable) I have personally reviewed the radiological images as listed and agreed with the findings in the report. DG Chest 2 View Result Date: 12/28/2023 CLINICAL DATA:  Shortness of breath.  Leg swelling. EXAM: CHEST - 2 VIEW COMPARISON:  09/17/2023 FINDINGS: Cardiopericardial silhouette is at upper limits of normal for size. Interstitial markings are diffusely coarsened with chronic features. No focal consolidation. No pleural effusion. Right Port-A-Cath again noted. IMPRESSION: Chronic interstitial coarsening without acute cardiopulmonary findings. Electronically Signed   By: Camellia Candle M.D.   On: 12/28/2023 11:02        Visit Diagnosis: 1. Edema, unspecified type   2. Hypomagnesemia   3. Malignant neoplasm of upper-outer quadrant of left breast in female, estrogen receptor negative (HCC)      Orders Placed This Encounter  Procedures   DG Chest 2 View    Standing Status:   Future    Number of Occurrences:   1    Expected Date:   12/28/2023    Expiration Date:   12/27/2024    Reason for Exam (SYMPTOM  OR DIAGNOSIS REQUIRED):   leg swelling, SOB, breast cancer    Is patient pregnant?:   No    Preferred imaging location?:   [facility]    All questions were answered. The patient knows to call the clinic with any problems, questions or concerns. No barriers to learning was detected.  A total of more than 30 minutes were spent on this encounter with face-to-face time and non-face-to-face time, including  preparing to see the patient, ordering tests and/or medications, counseling the patient and coordination of care as outlined above.    Thank you for allowing me to participate in the care of this patient.    Myiesha Edgar E  Walisiewicz, PA-C Department of Hematology/Oncology [facility] at [facility] Phone: [phone]  [phone]    12/28/2023 4:12 PM         [1]  Allergies Allergen Reactions   Codeine Hives   Other Other (See Comments)    Patient reports a history of difficult intubation   Sulfonamide Derivatives Hives   Lisinopril Cough  [2]  Current Outpatient Medications:    [START ON 12/29/2023] furosemide  (LASIX ) 20 MG tablet, Take 1 tablet daily for 3 days, then take daily as needed after that (do not take more than 3 days in a row)., Disp:  14 tablet, Rfl: 0   magnesium oxide (MAG-OX) 400 (240 Mg) MG tablet, Take 1 tablet (400 mg total) by mouth daily for 7 days., Disp: 7 tablet, Rfl: 0   acetaminophen  (TYLENOL ) 650 MG CR tablet, Take 1,300 mg by mouth in the morning, at noon, and at bedtime., Disp: , Rfl:    buPROPion (WELLBUTRIN XL) 150 MG 24 hr tablet, Take 150 mg by mouth in the morning., Disp: , Rfl:    carvedilol  (COREG ) 25 MG tablet, Take 1 tablet (25 mg total) by mouth 2 (two) times daily with a meal., Disp: 180 tablet, Rfl: 3   clobetasol cream (TEMOVATE) 0.05 %, Apply 1 Application topically 2 (two) times daily as needed (skin irritation)., Disp: , Rfl:    clopidogrel  (PLAVIX ) 75 MG tablet, Take 1 tablet (75 mg total) by mouth daily., Disp: 90 tablet, Rfl: 1   cyanocobalamin  (VITAMIN B12) 500 MCG tablet, Take 2 tablets (1,000 mcg total) by mouth daily., Disp: 60 tablet, Rfl: 3   cyclobenzaprine (FLEXERIL) 5 MG tablet, Take 5 mg by mouth 3 (three) times daily., Disp: , Rfl:    DULoxetine (CYMBALTA) 30 MG capsule, Take 30 mg by mouth in the morning. Total dose 90 mg (30 mg and 60 mg), Disp: , Rfl:    DULoxetine (CYMBALTA) 60 MG  capsule, Take 60 mg by mouth in the morning. Total dose 90 mg (30 mg and 60 mg), Disp: , Rfl:    fluticasone  (FLONASE ) 50 MCG/ACT nasal spray, Place 1-2 sprays into both nostrils daily., Disp: 18.2 mL, Rfl: 2   gabapentin  (NEURONTIN ) 100 MG capsule, Take 300 mg by mouth 2 (two) times daily., Disp: , Rfl:    JANUMET XR 50-1000 MG TB24, Take 1 tablet by mouth in the morning and at bedtime., Disp: , Rfl:    LANTUS  SOLOSTAR 100 UNIT/ML Solostar Pen, Inject 50 Units into the skin at bedtime., Disp: , Rfl:    lidocaine -prilocaine  (EMLA ) cream, APPLY TO THE AFFECTED AREA ONCE DAILY, Disp: 30 g, Rfl: 11   ondansetron  (ZOFRAN ) 8 MG tablet, TAKE 1 TABLET EVERY 8 HOURS AS NEEDED FOR NAUSEA OR VOMITING, Disp: 30 tablet, Rfl: 35   prochlorperazine  (COMPAZINE ) 10 MG tablet, TAKE 1 TABLET EVERY 6 HOURS AS NEEDED FOR NAUSEA OR VOMITING, Disp: 30 tablet, Rfl: 44   rosuvastatin  (CRESTOR ) 40 MG tablet, Take 1 tablet (40 mg total) by mouth daily., Disp: 90 tablet, Rfl: 3   spironolactone (ALDACTONE) 25 MG tablet, Take 12.5 mg by mouth. Twice daily, Disp: , Rfl:    Tiotropium Bromide-Olodaterol (STIOLTO RESPIMAT ) 2.5-2.5 MCG/ACT AERS, Inhale 2 puffs into the lungs daily., Disp: 4 g, Rfl: 11   traMADol  (ULTRAM ) 50 MG tablet, Take 50 mg by mouth every 6 (six) hours as needed., Disp: , Rfl:    triamcinolone  (KENALOG ) 0.025 % cream, Apply 1 Application topically 2 (two) times daily., Disp: 30 g, Rfl: 0   VENTOLIN  HFA 108 (90 Base) MCG/ACT inhaler, USE 2 INHALATIONS EVERY 6 HOURS AS NEEDED FOR WHEEZING OR SHORTNESS OF BREATH, Disp: 54 g, Rfl: 3  Current Facility-Administered Medications:    sodium chloride  flush (NS) 0.9 % injection 3 mL, 3 mL, Intravenous, Q12H, Court Dorn PARAS, MD   sodium chloride  flush (NS) 0.9 % injection 3 mL, 3 mL, Intravenous, Q12H, Court Dorn PARAS, MD

## 2023-12-28 NOTE — Patient Instructions (Signed)
 Furosemide  Injection What is this medication? FUROSEMIDE  (fyoor [place] se mide) reduces swelling related to heart, kidney, liver, or lung disease. It helps your kidneys remove more fluid and salt from your blood through the urine. It belongs to a group of medications called diuretics. This medicine may be used for other purposes; ask your health care provider or pharmacist if you have questions. What should I tell my care team before I take this medication? They need to know if you have any of these conditions: Diarrhea or vomiting Gout Heart disease High or low levels of electrolytes, such as magnesium, potassium, or sodium in your blood Kidney disease, small amounts of urine, or difficulty passing urine Liver disease Premature newborn Thyroid disease An unusual or allergic reaction to furosemide , sulfa medications, other medications, foods, dyes, or preservatives Pregnant or trying to get pregnant Breast-feeding How should I use this medication? This medication is injected into a vein or muscle. It is given in a hospital or clinic setting. Talk to your care team about the use of this medication in children. While it may be prescribed for children as young as newborns for selected conditions, precautions do apply. Overdosage: If you think you have taken too much of this medicine contact a poison control center or emergency room at once. NOTE: This medicine is only for you. Do not share this medicine with others. What if I miss a dose? This does not apply. This medication is not for regular use. What may interact with this medication? Aspirin  and aspirin -like medications Certain antibiotics Chloral hydrate Cisplatin Cyclosporine Digoxin Diuretics Laxatives Lithium Medications for blood pressure Medications that relax muscles for surgery Methotrexate NSAIDS, medications for pain and inflammation, such as ibuprofen, naproxen , or indomethacin Phenytoin Steroid medications, such as  prednisone  or cortisone Thyroid hormones This list may not describe all possible interactions. Give your health care provider a list of all the medicines, herbs, non-prescription drugs, or dietary supplements you use. Also tell them if you smoke, drink alcohol, or use illegal drugs. Some items may interact with your medicine. What should I watch for while using this medication? You will be monitored closely while you are receiving this medication. Visit your care team for regular checks on your progress. Check your blood pressure as directed. Know what your blood pressure should be and when to contact your care team. This medication may increase the amount of sugar in blood or urine. The risk may be higher in patients who already have diabetes. Ask your care team what you can do to lower your risk of diabetes while taking this medication. You may need to be on a special diet while taking this medication. Check with your care team. Also, ask how many glasses of fluid you need to drink a day. You must not get dehydrated. This medication may affect your coordination, reaction time, or judgment. Do not drive or operate machinery until you know how this medication affects you. Sit up or stand slowly to reduce the risk of dizzy or fainting spells. Drinking alcohol with this medication can increase the risk of these side effects. This medication can make you more sensitive to the sun. Keep out of the sun. If you cannot avoid being in the sun, wear protective clothing and use sunscreen. Do not use sun lamps or tanning beds/booths. Check with your care team if you have severe diarrhea, nausea, and vomiting, or if you sweat a lot. The loss of too much body fluid may make it dangerous for you  to take this medication. What side effects may I notice from receiving this medication? Side effects that you should report to your care team as soon as possible: Allergic reactions--skin rash, itching, hives, swelling of the  face, lips, tongue, or throat Dehydration--increased thirst, dry mouth, feeling faint or lightheaded, headache, dark yellow or brown urine Hearing loss, ringing in ears High blood sugar (hyperglycemia)--increased thirst or amount of urine, unusual weakness or fatigue, blurry vision Low blood pressure--dizziness, feeling faint or lightheaded, blurry vision Low potassium level--muscle pain or cramps, unusual weakness or fatigue, fast or irregular heartbeat, constipation Side effects that usually do not require medical attention (report to your care team if they continue or are bothersome): Burning or tingling sensation in hands or feet Constipation Diarrhea Dizziness Headache This list may not describe all possible side effects. Call your doctor for medical advice about side effects. You may report side effects to FDA at 1-800-FDA-1088. Where should I keep my medication? This medication is given in a hospital or clinic. It will not be stored at home. NOTE: This sheet is a summary. It may not cover all possible information. If you have questions about this medicine, talk to your doctor, pharmacist, or health care provider.  2024 Elsevier/Gold Standard (2020-11-23 00:00:00)

## 2024-01-01 ENCOUNTER — Ambulatory Visit (HOSPITAL_COMMUNITY): Admission: RE | Admit: 2024-01-01 | Discharge: 2024-01-01 | Attending: Adult Health | Admitting: Adult Health

## 2024-01-01 DIAGNOSIS — Z5181 Encounter for therapeutic drug level monitoring: Secondary | ICD-10-CM

## 2024-01-01 DIAGNOSIS — Z79899 Other long term (current) drug therapy: Secondary | ICD-10-CM | POA: Diagnosis not present

## 2024-01-01 DIAGNOSIS — I10 Essential (primary) hypertension: Secondary | ICD-10-CM | POA: Diagnosis not present

## 2024-01-01 DIAGNOSIS — I340 Nonrheumatic mitral (valve) insufficiency: Secondary | ICD-10-CM | POA: Diagnosis not present

## 2024-01-01 DIAGNOSIS — G4730 Sleep apnea, unspecified: Secondary | ICD-10-CM | POA: Diagnosis not present

## 2024-01-01 DIAGNOSIS — E785 Hyperlipidemia, unspecified: Secondary | ICD-10-CM | POA: Diagnosis not present

## 2024-01-01 DIAGNOSIS — F17200 Nicotine dependence, unspecified, uncomplicated: Secondary | ICD-10-CM | POA: Diagnosis not present

## 2024-01-01 LAB — ECHOCARDIOGRAM COMPLETE
Area-P 1/2: 2.96 cm2
Calc EF: 64.2 %
S' Lateral: 3.5 cm
Single Plane A2C EF: 62.6 %
Single Plane A4C EF: 65.9 %

## 2024-01-01 NOTE — Progress Notes (Signed)
°  Echocardiogram 2D Echocardiogram has been performed.  Koleen [name] Popper, RDCS 01/01/2024, 10:34 AM

## 2024-01-08 ENCOUNTER — Ambulatory Visit: Admitting: Pulmonary Disease

## 2024-01-08 ENCOUNTER — Encounter: Payer: Self-pay | Admitting: Pulmonary Disease

## 2024-01-08 VITALS — BP 110/54 | HR 61 | Temp 98.3°F | Ht 64.0 in | Wt 171.6 lb

## 2024-01-08 DIAGNOSIS — G4733 Obstructive sleep apnea (adult) (pediatric): Secondary | ICD-10-CM

## 2024-01-08 DIAGNOSIS — J449 Chronic obstructive pulmonary disease, unspecified: Secondary | ICD-10-CM | POA: Diagnosis not present

## 2024-01-08 DIAGNOSIS — J432 Centrilobular emphysema: Secondary | ICD-10-CM

## 2024-01-08 NOTE — Progress Notes (Signed)
 "              Mary Campbell    [id number]    [birth year]  Primary Care Physician:Hickson, Annabella, FNP  Referring Physician: No referring provider defined for this encounter.  Chief complaint:    History of obstructive sleep apnea, COPD  Discussed the use of AI scribe software for clinical note transcription with the patient, who gave verbal consent to proceed.  History of Present Illness Mary Campbell is a 58 year old female with sleep apnea who presents for follow-up of her sleep apnea management.  She sleeps about six hours per night, sometimes as little as four, since starting cancer treatment. Despite this, she feels rested in the morning and does not feel tired during the day, except when affected by chemotherapy.  She is currently using a CPAP machine, which records an average of five hours and fifteen minutes of sleep per night over the last thirty days. The machine is set to fluctuate between pressures of five and fifteen, with a 95% pressure of twelve point five. The machine detects about one point nine events per hour.  She continues to use Stiolto once daily for her breathing and does not avoid activities due to shortness of breath. No issues with the CPAP machine.  Continues to do well Currently undergoing treatment for breast cancer  Tolerating CPAP well Tolerating Stiolto well  Continues to be active  History of hypertension, diabetes, quit smoking in June 2025 History of anxiety, depression   Outpatient Encounter Medications as of 01/08/2024  Medication Sig   acetaminophen  (TYLENOL ) 650 MG CR tablet Take 1,300 mg by mouth in the morning, at noon, and at bedtime.   buPROPion (WELLBUTRIN XL) 150 MG 24 hr tablet Take 150 mg by mouth in the morning.   carvedilol  (COREG ) 25 MG tablet Take 1 tablet (25 mg total) by mouth 2 (two) times daily with a meal.   clobetasol cream (TEMOVATE) 0.05 % Apply 1 Application topically 2 (two) times daily as needed (skin irritation).    clopidogrel  (PLAVIX ) 75 MG tablet Take 1 tablet (75 mg total) by mouth daily.   cyanocobalamin  (VITAMIN B12) 500 MCG tablet Take 2 tablets (1,000 mcg total) by mouth daily.   cyclobenzaprine (FLEXERIL) 5 MG tablet Take 5 mg by mouth 3 (three) times daily.   DULoxetine (CYMBALTA) 30 MG capsule Take 30 mg by mouth in the morning. Total dose 90 mg (30 mg and 60 mg)   DULoxetine (CYMBALTA) 60 MG capsule Take 60 mg by mouth in the morning. Total dose 90 mg (30 mg and 60 mg)   fluticasone  (FLONASE ) 50 MCG/ACT nasal spray Place 1-2 sprays into both nostrils daily.   furosemide  (LASIX ) 20 MG tablet Take 1 tablet daily for 3 days, then take daily as needed after that (do not take more than 3 days in a row).   gabapentin  (NEURONTIN ) 100 MG capsule Take 300 mg by mouth 2 (two) times daily.   LANTUS  SOLOSTAR 100 UNIT/ML Solostar Pen Inject 50 Units into the skin at bedtime.   lidocaine -prilocaine  (EMLA ) cream APPLY TO THE AFFECTED AREA ONCE DAILY   ondansetron  (ZOFRAN ) 8 MG tablet TAKE 1 TABLET EVERY 8 HOURS AS NEEDED FOR NAUSEA OR VOMITING   prochlorperazine  (COMPAZINE ) 10 MG tablet TAKE 1 TABLET EVERY 6 HOURS AS NEEDED FOR NAUSEA OR VOMITING   rosuvastatin  (CRESTOR ) 40 MG tablet Take 1 tablet (40 mg total) by mouth daily.   spironolactone (ALDACTONE) 25 MG tablet Take 12.5 mg  by mouth. Twice daily   Tiotropium Bromide-Olodaterol (STIOLTO RESPIMAT ) 2.5-2.5 MCG/ACT AERS Inhale 2 puffs into the lungs daily.   traMADol  (ULTRAM ) 50 MG tablet Take 50 mg by mouth every 6 (six) hours as needed.   triamcinolone  (KENALOG ) 0.025 % cream Apply 1 Application topically 2 (two) times daily.   VENTOLIN  HFA 108 (90 Base) MCG/ACT inhaler USE 2 INHALATIONS EVERY 6 HOURS AS NEEDED FOR WHEEZING OR SHORTNESS OF BREATH   JANUMET XR 50-1000 MG TB24 Take 1 tablet by mouth in the morning and at bedtime.   Facility-Administered Encounter Medications as of 01/08/2024  Medication   sodium chloride  flush (NS) 0.9 % injection 3 mL    sodium chloride  flush (NS) 0.9 % injection 3 mL    Allergies as of 01/08/2024 - Review Complete 01/08/2024  Allergen Reaction Noted   Codeine Hives 11/27/2010   Other Other (See Comments) 06/20/2023   Sulfonamide derivatives Hives 11/27/2010   Lisinopril Cough 10/13/2012    Past Medical History:  Diagnosis Date   Anxiety    Arthritis    Breast cancer (HCC)    left   Chronic back pain    COPD (chronic obstructive pulmonary disease) (HCC)    Diabetes mellitus    Difficult intubation    Diverticulitis    Dyspnea    with exertion   Fibromyalgia    Hyperlipidemia    Hypertension    PAD (peripheral artery disease)    a. PV angio 10/2018 - bilateral SFA disease s/p L intervention.   PAD (peripheral artery disease)    PONV (postoperative nausea and vomiting)    Sleep apnea    Tobacco abuse     Past Surgical History:  Procedure Laterality Date   ABDOMINAL AORTOGRAM W/LOWER EXTREMITY Right 10/18/2018   Procedure: ABDOMINAL AORTOGRAM W/LOWER EXTREMITY;  Surgeon: Court Dorn PARAS, MD;  Location: [facility];  Service: Cardiovascular;  Laterality: Right;   ABDOMINAL AORTOGRAM W/LOWER EXTREMITY N/A 04/02/2020   Procedure: ABDOMINAL AORTOGRAM W/LOWER EXTREMITY;  Surgeon: Court Dorn PARAS, MD;  Location: [facility];  Service: Cardiovascular;  Laterality: N/A;   ABDOMINAL AORTOGRAM W/LOWER EXTREMITY N/A 04/23/2020   Procedure: ABDOMINAL AORTOGRAM W/LOWER EXTREMITY;  Surgeon: Court Dorn PARAS, MD;  Location: [facility];  Service: Cardiovascular;  Laterality: N/A;   ABDOMINAL HYSTERECTOMY     BREAST BIOPSY Left 05/31/2023   US  LT BREAST BX W LOC DEV 1ST LESION IMG BX SPEC US  GUIDE 05/31/2023 [facility]   BREAST BIOPSY  06/27/2023   MM LT RADIOACTIVE SEED LOC MAMMO GUIDE 06/27/2023 [facility]   BREAST LUMPECTOMY WITH RADIOACTIVE SEED AND SENTINEL LYMPH NODE BIOPSY Left 06/29/2023   Procedure: BREAST LUMPECTOMY WITH RADIOACTIVE SEED AND SENTINEL LYMPH NODE  BIOPSY;  Surgeon: Curvin Deward MOULD, MD;  Location: [facility];  Service: General;  Laterality: Left;  LEFT BREAST RADIOACTIVE SEED LOCALIZED LUMPECTOMY   SENTINEL NODE BIOPSY   CHOLECYSTECTOMY     PERIPHERAL VASCULAR ATHERECTOMY Left 10/18/2018   Procedure: PERIPHERAL VASCULAR ATHERECTOMY;  Surgeon: Court Dorn PARAS, MD;  Location: [facility];  Service: Cardiovascular;  Laterality: Left;  SFA   PERIPHERAL VASCULAR ATHERECTOMY Right 11/22/2018   Procedure: PERIPHERAL VASCULAR ATHERECTOMY;  Surgeon: Court Dorn PARAS, MD;  Location: [facility];  Service: Cardiovascular;  Laterality: Right;  SFA   PERIPHERAL VASCULAR ATHERECTOMY  04/23/2020   Procedure: PERIPHERAL VASCULAR ATHERECTOMY;  Surgeon: Court Dorn PARAS, MD;  Location: [facility];  Service: Cardiovascular;;  PERIPHERAL VASCULAR BALLOON ANGIOPLASTY Right 11/22/2018   Procedure: PERIPHERAL VASCULAR BALLOON ANGIOPLASTY;  Surgeon: Court Dorn PARAS, MD;  Location: [facility];  Service: Cardiovascular;  Laterality: Right;  SFA   PERIPHERAL VASCULAR BALLOON ANGIOPLASTY  04/23/2020   Procedure: PERIPHERAL VASCULAR BALLOON ANGIOPLASTY;  Surgeon: Court Dorn PARAS, MD;  Location: [facility];  Service: Cardiovascular;;   PERIPHERAL VASCULAR INTERVENTION Left 04/02/2020   Procedure: PERIPHERAL VASCULAR INTERVENTION;  Surgeon: Court Dorn PARAS, MD;  Location: [facility];  Service: Cardiovascular;  Laterality: Left;   PORTACATH PLACEMENT N/A 06/29/2023   Procedure: INSERTION, TUNNELED CENTRAL VENOUS DEVICE, WITH PORT;  Surgeon: Curvin Deward MOULD, MD;  Location: [facility];  Service: General;  Laterality: N/A;  PORT PLACEMENT WITH ULTRASOUND GUIDANCE GEN w/PEC BLOCK    Family History  Problem Relation Age of Onset   Diabetes Mother    Hypertension Mother    Hyperlipidemia Mother    Heart attack Father    Heart disease Father    Hypertension Father    Hyperlipidemia Father    Breast cancer Neg Hx    Colon cancer Neg  Hx    Esophageal cancer Neg Hx     Social History   Socioeconomic History   Marital status: Legally Separated    Spouse name: Not on file   Number of children: 2   Years of education: Not on file   Highest education level: Not on file  Occupational History   Occupation: disabled  Tobacco Use   Smoking status: Some Days    Current packs/day: 0.00    Average packs/day: 1.0 packs/day    Types: Cigarettes    Last attempt to quit: 08/11/2022    Years since quitting: 1.4   Smokeless tobacco: Never   Tobacco comments:    Trying quit with patch  Vaping Use   Vaping status: Never Used  Substance and Sexual Activity   Alcohol use: No   Drug use: No   Sexual activity: Yes    Birth control/protection: Surgical  Other Topics Concern   Not on file  Social History Narrative   Not on file   Social Drivers of Health   Tobacco Use: High Risk (01/08/2024)   Patient History    Smoking Tobacco Use: Some Days    Smokeless Tobacco Use: Never    Passive Exposure: Not on file  Financial Resource Strain: Not on file  Food Insecurity: Food Insecurity Present (11/24/2023)   Epic    Worried About Programme Researcher, Broadcasting/film/video in the Last Year: Sometimes true    [employer] of Food in the Last Year: Sometimes true  Transportation Needs: No Transportation Needs (10/24/2023)   Epic    Lack of Transportation (Medical): No    Lack of Transportation (Non-Medical): No  Physical Activity: Not on file  Stress: Not on file  Social Connections: Not on file  Intimate Partner Violence: Not At Risk (10/24/2023)   Epic    Fear of Current or Ex-Partner: No    Emotionally Abused: No    Physically Abused: No    Sexually Abused: No  Depression (PHQ2-9): Low Risk (12/28/2023)   Depression (PHQ2-9)    PHQ-2 Score: 0  Alcohol Screen: Not on file  Housing: Low Risk (10/24/2023)   Epic    Unable to Pay for Housing in the Last Year: No    Number of Times Moved in the Last Year: 0    Homeless in the Last Year: No  Utilities: At Risk (11/09/2023)   Epic    Threatened with loss of utilities: Yes  Health Literacy: Not on file    Review of Systems  Respiratory:  Positive for apnea and shortness of breath.   Psychiatric/Behavioral:  Positive for sleep disturbance.     Vitals:   01/08/24 0943  BP: (!) 110/54  Pulse: 61  Temp: 98.3 F (36.8 C)  SpO2: 98%     Physical Exam Constitutional:      Appearance: Normal appearance.  HENT:     Head: Normocephalic.     Mouth/Throat:     Mouth: Mucous membranes are moist.  Eyes:     General: No scleral icterus. Cardiovascular:     Rate and Rhythm: Normal rate and regular rhythm.     Heart sounds: No murmur heard.    No friction rub.  Pulmonary:     Effort: No respiratory distress.     Breath sounds: No stridor. No wheezing or rhonchi.  Musculoskeletal:     Cervical back: No rigidity or tenderness.  Neurological:     General: No focal deficit present.     Mental Status: She is alert.  Psychiatric:        Mood and Affect: Mood normal.    Data Reviewed: Download from her CPAP shows excellent compliance at 93% Average use of 5 hours 15 minutes AutoSet 5-15 95 percentile pressure of 12.5 with residual AHI 1.9    Assessment and Plan Assessment & Plan Obstructive sleep apnea Managed with CPAP therapy. She reports sleeping 4-6 hours per night, feeling rested upon waking, and not experiencing daytime fatigue. CPAP data shows an average of 5 hours and 15 minutes of use per night with a pressure setting of 12.5 cm H2O, 95% of the time. The number of apnea events is 1.9 per hour, which is within the acceptable range for treatment efficacy. - Continue current CPAP therapy settings.  Chronic obstructive pulmonary disease Managed with Stiolto Respimat , used once daily. She reports no avoidance of activities due to dyspnea and no significant issues with breathing. - Continue Stiolto Respimat  once daily.  Encourage graded activities as  tolerated  Follow-up in about 6 to 8 months  Encouraged to give us  a call with any significant concerns  Continue weight loss efforts   No orders of the defined types were placed in this encounter.     Jennet Epley MD [facility] 01/08/2024, 10:00 AM  CC: No ref. provider found   "

## 2024-01-08 NOTE — Patient Instructions (Addendum)
 Follow-up in 6 to 8 months  Download from the machine shows it is working well  Risk Analyst  Continue graded activities as tolerated  Call us  with significant concerns  Good luck with your ongoing treatment  Happy holidays

## 2024-01-09 ENCOUNTER — Encounter: Payer: Self-pay | Admitting: Hematology and Oncology

## 2024-01-09 ENCOUNTER — Inpatient Hospital Stay

## 2024-01-09 ENCOUNTER — Inpatient Hospital Stay: Admitting: Hematology and Oncology

## 2024-01-09 VITALS — BP 128/56 | HR 82 | Temp 98.2°F | Resp 18 | Ht 64.0 in | Wt 170.1 lb

## 2024-01-09 VITALS — BP 151/65 | HR 60 | Resp 16

## 2024-01-09 DIAGNOSIS — Z171 Estrogen receptor negative status [ER-]: Secondary | ICD-10-CM

## 2024-01-09 DIAGNOSIS — Z5111 Encounter for antineoplastic chemotherapy: Secondary | ICD-10-CM | POA: Diagnosis not present

## 2024-01-09 DIAGNOSIS — C50412 Malignant neoplasm of upper-outer quadrant of left female breast: Secondary | ICD-10-CM

## 2024-01-09 MED ORDER — TRASTUZUMAB-ANNS CHEMO 150 MG IV SOLR
6.0000 mg/kg | Freq: Once | INTRAVENOUS | Status: AC
  Administered 2024-01-09: 483 mg via INTRAVENOUS
  Filled 2024-01-09: qty 23

## 2024-01-09 MED ORDER — SODIUM CHLORIDE 0.9% FLUSH: 10.0000 mL | INTRAVENOUS | Status: DC | PRN

## 2024-01-09 MED ORDER — SODIUM CHLORIDE 0.9 % IV SOLN: INTRAVENOUS | Status: DC

## 2024-01-09 MED ORDER — ACETAMINOPHEN 325 MG PO TABS
650.0000 mg | ORAL_TABLET | Freq: Once | ORAL | Status: AC
  Administered 2024-01-09: 650 mg via ORAL
  Filled 2024-01-09: qty 2

## 2024-01-09 MED ORDER — DIPHENHYDRAMINE HCL 25 MG PO CAPS
50.0000 mg | ORAL_CAPSULE | Freq: Once | ORAL | Status: AC
  Administered 2024-01-09: 50 mg via ORAL
  Filled 2024-01-09: qty 2

## 2024-01-09 NOTE — Progress Notes (Signed)
 "  Patient Care Team: Durel Riggs, FNP as PCP - General (Nurse Practitioner) Barbaraann, Darryle Ned, MD as PCP - Cardiology (Cardiology) Odean Potts, MD as Consulting Physician (Hematology and Oncology) Tyree Nanetta SAILOR, RN as Oncology Nurse Navigator Curvin Deward MOULD, MD as Consulting Physician (General Surgery) Izell Domino, MD as Attending Physician (Radiation Oncology)  DIAGNOSIS:  Encounter Diagnosis  Name Primary?   Malignant neoplasm of upper-outer quadrant of left breast in female, estrogen receptor negative (HCC) Yes    SUMMARY OF ONCOLOGIC HISTORY: Oncology History  Malignant neoplasm of upper-outer quadrant of left breast in female, estrogen receptor negative (HCC)  05/31/2023 Initial Diagnosis   Screening mammogram detected left breast mass and asymmetry retroareolar region, irregular spiculated mass 1.1 cm, axilla negative, ultrasound measured 1.2 cm, biopsy: Grade 2 IDC ER 0% PR 0% Ki67 15%, HER2 3+ positive   06/07/2023 Cancer Staging   Staging form: Breast, AJCC 8th Edition - Clinical: Stage IA (cT1c, cN0, cM0, G2, ER-, PR-, HER2+) - Signed by Odean Potts, MD on 06/07/2023 Stage prefix: Initial diagnosis Histologic grading system: 3 grade system   06/29/2023 Surgery   Left lumpectomy: 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, margins negative, 0/3 lymph nodes negative, ER 0%, PR 0%, HER2 3+ positive, Ki-67 15%    06/29/2023 Cancer Staging   Staging form: Breast, AJCC 8th Edition - Pathologic stage from 06/29/2023: Stage IA (pT1c, pN0, cM0, G2, ER-, PR-, HER2+) - Signed by Crawford Morna Pickle, NP on 09/11/2023 Stage prefix: Initial diagnosis Histologic grading system: 3 grade system   08/07/2023 -  Chemotherapy   Patient is on Treatment Plan : BREAST Paclitaxel  + Trastuzumab  q7d / Trastuzumab  q21d       CHIEF COMPLIANT: Herceptin  maintenance  HISTORY OF PRESENT ILLNESS:  History of Present Illness Mary Campbell is a 58 year old female with  stage IA HER2-positive, 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  She is receiving adjuvant trastuzumab  maintenance therapy, started August 07, 2023, with the final dose planned for June 25, 2024.  She has no new or worsening malignancy- or treatment-related symptoms. Hair is regrowing after chemotherapy.  Fluid retention has improved on furosemide , with nine-pound weight loss and resolution of lower extremity edema. One foot remains erythematous without edema. Energy is variable. She is mildly anemic without symptoms of worsening anemia.      ALLERGIES:  is allergic to codeine, other, sulfonamide derivatives, and lisinopril.  MEDICATIONS:  Current Outpatient Medications  Medication Sig Dispense Refill   acetaminophen  (TYLENOL ) 650 MG CR tablet Take 1,300 mg by mouth in the morning, at noon, and at bedtime.     buPROPion (WELLBUTRIN XL) 150 MG 24 hr tablet Take 150 mg by mouth in the morning.     carvedilol  (COREG ) 25 MG tablet Take 1 tablet (25 mg total) by mouth 2 (two) times daily with a meal. 180 tablet 3   clobetasol cream (TEMOVATE) 0.05 % Apply 1 Application topically 2 (two) times daily as needed (skin irritation).     clopidogrel  (PLAVIX ) 75 MG tablet Take 1 tablet (75 mg total) by mouth daily. 90 tablet 1   cyanocobalamin  (VITAMIN B12) 500 MCG tablet Take 2 tablets (1,000 mcg total) by mouth daily. 60 tablet 3   cyclobenzaprine (FLEXERIL) 5 MG tablet Take 5 mg by mouth 3 (three) times daily.     DULoxetine (CYMBALTA) 30 MG capsule Take 30 mg by mouth in  the morning. Total dose 90 mg (30 mg and 60 mg)     DULoxetine (CYMBALTA) 60 MG capsule Take 60 mg by mouth in the morning. Total dose 90 mg (30 mg and 60 mg)     fluticasone  (FLONASE ) 50 MCG/ACT nasal spray Place 1-2 sprays into both nostrils daily. 18.2 mL 2   furosemide  (LASIX ) 20 MG tablet Take 1  tablet daily for 3 days, then take daily as needed after that (do not take more than 3 days in a row). 14 tablet 0   gabapentin  (NEURONTIN ) 100 MG capsule Take 300 mg by mouth 2 (two) times daily.     JANUMET XR 50-1000 MG TB24 Take 1 tablet by mouth in the morning and at bedtime.     LANTUS  SOLOSTAR 100 UNIT/ML Solostar Pen Inject 50 Units into the skin at bedtime.     lidocaine -prilocaine  (EMLA ) cream APPLY TO THE AFFECTED AREA ONCE DAILY 30 g 11   ondansetron  (ZOFRAN ) 8 MG tablet TAKE 1 TABLET EVERY 8 HOURS AS NEEDED FOR NAUSEA OR VOMITING 30 tablet 35   prochlorperazine  (COMPAZINE ) 10 MG tablet TAKE 1 TABLET EVERY 6 HOURS AS NEEDED FOR NAUSEA OR VOMITING 30 tablet 44   rosuvastatin  (CRESTOR ) 40 MG tablet Take 1 tablet (40 mg total) by mouth daily. 90 tablet 3   spironolactone (ALDACTONE) 25 MG tablet Take 12.5 mg by mouth. Twice daily     Tiotropium Bromide-Olodaterol (STIOLTO RESPIMAT ) 2.5-2.5 MCG/ACT AERS Inhale 2 puffs into the lungs daily. 4 g 11   traMADol  (ULTRAM ) 50 MG tablet Take 50 mg by mouth every 6 (six) hours as needed.     triamcinolone  (KENALOG ) 0.025 % cream Apply 1 Application topically 2 (two) times daily. 30 g 0   VENTOLIN  HFA 108 (90 Base) MCG/ACT inhaler USE 2 INHALATIONS EVERY 6 HOURS AS NEEDED FOR WHEEZING OR SHORTNESS OF BREATH 54 g 3   Current Facility-Administered Medications  Medication Dose Route Frequency Provider Last Rate Last Admin   sodium chloride  flush (NS) 0.9 % injection 3 mL  3 mL Intravenous Q12H Court Dorn PARAS, MD       sodium chloride  flush (NS) 0.9 % injection 3 mL  3 mL Intravenous Q12H Court Dorn PARAS, MD        PHYSICAL EXAMINATION: ECOG PERFORMANCE STATUS: 1 - Symptomatic but completely ambulatory   LABORATORY DATA:  I have reviewed the data as listed    Latest Ref Rng & Units 12/28/2023    8:30 AM 12/19/2023    8:27 AM 11/28/2023    8:33 AM  CMP  Glucose 70 - 99 mg/dL 828  815  854   BUN 6 - 20 mg/dL 11  16  17    Creatinine 0.44  - 1.00 mg/dL 8.75  8.93  8.95   Sodium 135 - 145 mmol/L 139  138  138   Potassium 3.5 - 5.1 mmol/L 4.2  3.8  4.1   Chloride 98 - 111 mmol/L 106  104  105   CO2 22 - 32 mmol/L 23  24  26    Calcium  8.9 - 10.3 mg/dL 9.1  8.7  8.8   Total Protein 6.5 - 8.1 g/dL 6.7  6.7  7.0   Total Bilirubin 0.0 - 1.2 mg/dL 0.2  0.2  0.2   Alkaline Phos 38 - 126 U/L 161  174  159   AST 15 - 41 U/L 11  14  10    ALT 0 - 44 U/L <5  7  7     Lab Results  Component Value Date   WBC 8.2 12/28/2023   HGB 9.4 (L) 12/28/2023   HCT 29.2 (L) 12/28/2023   MCV 87.7 12/28/2023   PLT 226 12/28/2023   NEUTROABS 5.9 12/28/2023    ASSESSMENT & PLAN:  Malignant neoplasm of upper-outer quadrant of left breast in female, estrogen receptor negative (HCC) 06/29/2023: Left lumpectomy: 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, margins negative, 0/3 lymph nodes negative, ER 0%, PR 0%, HER2 3+ positive, Ki-67 15%   Treatment plan: Adjuvant chemotherapy with Taxol  Herceptin  followed by Herceptin  maintenance for 1 year Adjuvant radiation therapy --------------------------------------------------------------------------------------------------- Current treatment: Herceptin  maintenance (Taxol  Herceptin  x 6 (stopped early because of neuropathy)  Echo 07/03/2023: EF 70 to 75%   Herceptin  toxicities: No major adverse effects. Echocardiogram December 2025: EF 70%  Return to clinic every 3 weeks for follow-up on Herceptin  and every 6 weeks for follow-up with me.  No orders of the defined types were placed in this encounter.  The patient has a good understanding of the overall plan. she agrees with it. she will call with any problems that may develop before the next visit here.  I personally spent a total of 30 minutes in the care of the patient today including preparing to see the patient, getting/reviewing separately obtained history, performing a medically appropriate exam/evaluation, counseling and educating, placing  orders, referring and communicating with other health care professionals, documenting clinical information in the EHR, independently interpreting results, communicating results, and coordinating care.   Viinay K Gerado Nabers, MD 01/09/2024    "

## 2024-01-09 NOTE — Assessment & Plan Note (Signed)
 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   Anemia: Hemoglobin 11.4.  Patient reports that she is taking oral iron. Diarrhea 2-3 loose stools per day.  She takes Imodium as needed Nausea after cycle 2: I added Zofran  to her treatment     Return to clinic every 3 weeks for follow-up on Herceptin  and every 6 weeks for follow-up with me.

## 2024-01-13 NOTE — Progress Notes (Unsigned)
 "  Cardiology Office Note    Date:  01/15/2024  ID:  Mary Campbell, DOB 03-02-1965, MRN [id number] PCP:  Durel Riggs, FNP  Cardiologist:  Darryle ONEIDA Decent, MD  Electrophysiologist:  None   Chief Complaint: Follow up for HTN  History of Present Illness: .   Mary Campbell is a 58 y.o. female with visit-pertinent history of peripheral arterial disease, diabetes, hypertension, hyperlipidemia and history of breast cancer.  First evaluated by Dr. Decent in 08/2018 for chest pain.  She had a low risk MPI in 09/2018 that showed no evidence of ischemia.  She had also noted symptoms of PAD and claudication, Doppler studies in 09/2018 revealed ABIs of 0.7 bilaterally with an occluded above-the-knee popliteal on the right and high frequency signal in the proximal left SFA.  On 10/18/2018 had a peripheral angiography revealing short segment CTO mid to distal right SFA with three-vessel runoff and 95% / 60% segmental proximal left SFA stenosis with three-vessel runoff.  She had directional atherectomy followed by drug-coated balloon angioplasty of the proximal left SFA.  On 11/2018 he underwent right SFA directional artherectomy followed by drug-coated balloon angioplasty.  In 03/2020 she had a repeat peripheral angiography revealing high-grade segmental mid left SFA stenosis with three-vessel runoff, Dr. Court performed directional atherectomy followed by drug-coated balloon angioplasty, and 04/2020 she had right SFA intervention with direct atherectomy followed by Simya Tercero [facility] using distal protection.  Patient recently completed radiation therapy for breast cancer treatment, continues with ongoing chemotherapy every 3 weeks.  She has history of difficult to control hypertension.  She was last seen in clinic on 12/04/2023 by Dr. Decent, her Cardizem was discontinued and she was started on carvedilol  25 mg twice daily, recommended close follow-up with APP for further evaluation.  Echocardiogram on 01/01/2024 indicated LVEF 70  to 75%, no RWMA, GIIDD, RV systolic function size was normal, normal PASP, no evidence of mitral regurgitation and no evidence of stenosis.  Today she presents for follow-up.  She reports that she is doing well overall.  She denies any chest pain, shortness of breath, orthopnea or PND.  Patient notes that in middle of December she noted increased swelling in her legs and in her hands, was seen by oncology and started on Lasix  for 3 days with significant improvement.  Patient reports that she lost 9 pounds, following this her blood pressure also significantly improved.  She notes she did take 1 dose of Lasix  last week for some mild increased lower extremity edema with improvement.  Patient reports that she is tolerated well, denies any presyncope or syncope.  Her blood pressure log shows significant improvement in her blood pressure following Lasix  with averaging less than 130/80 consistently. ROS: .   Today she denies chest pain, shortness of breath, fatigue, palpitations, melena, hematuria, hemoptysis, diaphoresis, weakness, presyncope, syncope, orthopnea, and PND.  All other systems are reviewed and otherwise negative. Studies Reviewed: SABRA   EKG:  EKG is not ordered today.  CV Studies: Cardiac studies reviewed are outlined and summarized above. Otherwise please see EMR for full report. Cardiac Studies & Procedures   ______________________________________________________________________________________________   STRESS TESTS  MYOCARDIAL PERFUSION IMAGING 02/01/2023  Interpretation Summary   The study is normal. The study is low risk.   No ST deviation was noted.   LV perfusion is normal. There is no evidence of ischemia. There is no evidence of infarction.   Left ventricular function is normal. Nuclear stress EF: 59%. The left ventricular ejection fraction is normal (55-65%). End  diastolic cavity size is normal. End systolic cavity size is normal. No evidence of transient ischemic dilation (TID)  noted.   Prior study available for comparison. No changes compared to prior study.   ECHOCARDIOGRAM  ECHOCARDIOGRAM COMPLETE 01/01/2024  Narrative ECHOCARDIOGRAM REPORT    Patient Name:   Mary Campbell Date of Exam: 01/01/2024 Medical Rec #:  [id number]     Height:       64.0 in Accession #:    [id number]    Weight:       179.4 lb Date of Birth:  Aug 05, 1965     BSA:          1.868 m Patient Age:    58 years      BP:           151/64 mmHg Patient Gender: F             HR:           67 bpm. Exam Location:  Outpatient  Procedure: 2D Echo, Cardiac Doppler, Color Doppler and Strain Analysis (Both Spectral and Color Flow Doppler were utilized during procedure).  Indications:    Chemo Z09  History:        Patient has prior history of Echocardiogram examinations, most recent 09/28/2023. Radiation therapy/chemotherapy; Risk Factors:Hypertension, Dyslipidemia, Current Smoker and Sleep Apnea.  Sonographer:    Koleen Popper RDCS Referring Phys: 27 LINDSEY CORNETTO CAUSEY   Sonographer Comments: Image acquisition challenging due to respiratory motion. Global longitudinal strain was attempted. IMPRESSIONS   1. Left ventricular ejection fraction, by estimation, is 70 to 75%. The left ventricle has hyperdynamic function. The left ventricle has no regional wall motion abnormalities. Left ventricular diastolic parameters are consistent with Grade II diastolic dysfunction (pseudonormalization). The average left ventricular global longitudinal strain is -19.3 %. The global longitudinal strain is normal. 2. Right ventricular systolic function is normal. The right ventricular size is normal. There is normal pulmonary artery systolic pressure. 3. The mitral valve is normal in structure. No evidence of mitral valve regurgitation. No evidence of mitral stenosis. 4. The aortic valve is normal in structure. Aortic valve regurgitation is not visualized. No aortic stenosis is present. 5. The inferior  vena cava is normal in size with greater than 50% respiratory variability, suggesting right atrial pressure of 3 mmHg.  FINDINGS Left Ventricle: Left ventricular ejection fraction, by estimation, is 70 to 75%. The left ventricle has hyperdynamic function. The left ventricle has no regional wall motion abnormalities. The average left ventricular global longitudinal strain is -19.3 %. Strain was performed and the global longitudinal strain is normal. The left ventricular internal cavity size was normal in size. There is no left ventricular hypertrophy. Left ventricular diastolic parameters are consistent with Grade II diastolic dysfunction (pseudonormalization).  Right Ventricle: The right ventricular size is normal. No increase in right ventricular wall thickness. Right ventricular systolic function is normal. There is normal pulmonary artery systolic pressure. The tricuspid regurgitant velocity is 2.27 m/s, and with an assumed right atrial pressure of 8 mmHg, the estimated right ventricular systolic pressure is 28.6 mmHg.  Left Atrium: Left atrial size was normal in size.  Right Atrium: Right atrial size was normal in size.  Pericardium: There is no evidence of pericardial effusion.  Mitral Valve: The mitral valve is normal in structure. No evidence of mitral valve regurgitation. No evidence of mitral valve stenosis.  Tricuspid Valve: The tricuspid valve is normal in structure. Tricuspid valve regurgitation is trivial. No evidence of tricuspid stenosis.  Aortic Valve: The aortic valve is normal in structure. Aortic valve regurgitation is not visualized. No aortic stenosis is present.  Pulmonic Valve: The pulmonic valve was normal in structure. Pulmonic valve regurgitation is not visualized. No evidence of pulmonic stenosis.  Aorta: The aortic root is normal in size and structure.  Venous: The inferior vena cava is normal in size with greater than 50% respiratory variability, suggesting  right atrial pressure of 3 mmHg.  IAS/Shunts: No atrial level shunt detected by color flow Doppler.   LEFT VENTRICLE PLAX 2D LVIDd:         5.50 cm     Diastology LVIDs:         3.50 cm     LV e' medial:    7.72 cm/s LV PW:         1.00 cm     LV E/e' medial:  13.5 LV IVS:        1.00 cm     LV e' lateral:   6.53 cm/s LVOT diam:     2.10 cm     LV E/e' lateral: 15.9 LV SV:         88 LV SV Index:   47          2D Longitudinal Strain LVOT Area:     3.46 cm    2D Strain GLS Avg:     -19.3 %  LV Volumes (MOD) LV vol d, MOD A2C: 76.3 ml LV vol d, MOD A4C: 87.2 ml LV vol s, MOD A2C: 28.5 ml LV vol s, MOD A4C: 29.7 ml LV SV MOD A2C:     47.8 ml LV SV MOD A4C:     87.2 ml LV SV MOD BP:      52.8 ml  RIGHT VENTRICLE             IVC RV S prime:     13.70 cm/s  IVC diam: 2.40 cm TAPSE (M-mode): 2.3 cm  LEFT ATRIUM             Index LA diam:        4.50 cm 2.41 cm/m LA Vol (A2C):   38.2 ml 20.45 ml/m LA Vol (A4C):   51.6 ml 27.62 ml/m LA Biplane Vol: 47.4 ml 25.38 ml/m AORTIC VALVE LVOT Vmax:   118.00 cm/s LVOT Vmean:  77.100 cm/s LVOT VTI:    0.255 m  AORTA Ao Root diam: 3.00 cm Ao Asc diam:  3.20 cm  MITRAL VALVE                TRICUSPID VALVE MV Area (PHT): 2.96 cm     TR Peak grad:   20.6 mmHg MV Decel Time: 256 msec     TR Vmax:        227.00 cm/s MV E velocity: 104.00 cm/s MV A velocity: 114.00 cm/s  SHUNTS MV E/A ratio:  0.91         Systemic VTI:  0.26 m Systemic Diam: 2.10 cm  Toribio Fuel MD Electronically signed by Toribio Fuel MD Signature Date/Time: 01/01/2024/8:25:38 PM    Final          ______________________________________________________________________________________________       Current Reported Medications:.    Active Medications[1]  Physical Exam:    VS:  BP (!) 130/48   Pulse 67   Ht 5' 4.5 (1.638 m)   Wt 170 lb 3.2 oz (77.2 kg)   SpO2 99%   BMI 28.76 kg/m  Wt Readings from Last 3 Encounters:  01/15/24 170  lb 3.2 oz (77.2 kg)  01/09/24 170 lb 1.6 oz (77.2 kg)  01/08/24 171 lb 9.6 oz (77.8 kg)    GEN: Well nourished, well developed in no acute distress NECK: No JVD; No carotid bruits CARDIAC: RRR, no murmurs, rubs, gallops RESPIRATORY:  Clear to auscultation without rales, wheezing or rhonchi  ABDOMEN: Soft, non-tender, non-distended EXTREMITIES:  No edema; No acute deformity     Asessement and Plan:SABRA    Hypertension/diastolic dysfunction: Blood pressure today 130/48.  Recent echo indicates LVEF 70 to 75%, G2 DD.  Patient's home blood pressure log shows significant improvement in blood pressure following doses of Lasix  earlier this month with blood pressures consistently being less than 130/80.  She reports that she has been tolerating her medications very well and use Lasix  once last week with improvement as well.  Today she appears euvolemic and well compensated on exam. Encouraged patient to continue monitoring her blood pressure at home and notify the office if consistently elevated 130/80.  Continue carvedilol  25 mg twice daily, Lasix  20 mg daily as needed, spironolactone 12.5 mg twice daily.  Check VMAT and magnesium  level today.  PAD: Patient with history of bilateral superficial femoral artery stenosis, currently stable. Today she denies any claudication or increased lower extremity pain. Followed by Dr. Court.  Continue Plavix .  Hyperlipidemia: Last lipid profile on 12/04/2023 indicated total cholesterol 105, HDL 29, triglycerides 189 and LDL 45.  Continue Crestor  40 mg daily.  Type II DM: Most recent A1c 6.4%.  Monitored and managed per PCP.  Breast cancer: Patient with active chemotherapy with trastuzumab .  She recently completed radiation.  Continue echocardiograms every 6 months   Disposition: F/u with Dr. Court in 04/2024 and Dr. Barbaraann in 11/2024.   Signed, Arthella Headings D Kylil Swopes, NP       [1]  Current Meds  Medication Sig   acetaminophen  (TYLENOL ) 650 MG CR tablet Take 1,300 mg by  mouth in the morning, at noon, and at bedtime.   buPROPion (WELLBUTRIN XL) 150 MG 24 hr tablet Take 150 mg by mouth in the morning.   carvedilol  (COREG ) 25 MG tablet Take 1 tablet (25 mg total) by mouth 2 (two) times daily with a meal.   clobetasol cream (TEMOVATE) 0.05 % Apply 1 Application topically 2 (two) times daily as needed (skin irritation).   clopidogrel  (PLAVIX ) 75 MG tablet Take 1 tablet (75 mg total) by mouth daily.   cyanocobalamin  (VITAMIN B12) 500 MCG tablet Take 2 tablets (1,000 mcg total) by mouth daily.   cyclobenzaprine (FLEXERIL) 5 MG tablet Take 5 mg by mouth 3 (three) times daily.   DULoxetine (CYMBALTA) 30 MG capsule Take 30 mg by mouth in the morning. Total dose 90 mg (30 mg and 60 mg)   DULoxetine (CYMBALTA) 60 MG capsule Take 60 mg by mouth in the morning. Total dose 90 mg (30 mg and 60 mg)   fluticasone  (FLONASE ) 50 MCG/ACT nasal spray Place 1-2 sprays into both nostrils daily.   furosemide  (LASIX ) 20 MG tablet Take 1 tablet daily for 3 days, then take daily as needed after that (do not take more than 3 days in a row).   gabapentin  (NEURONTIN ) 100 MG capsule Take 300 mg by mouth 2 (two) times daily.   LANTUS  SOLOSTAR 100 UNIT/ML Solostar Pen Inject 50 Units into the skin at bedtime.   lidocaine -prilocaine  (EMLA ) cream APPLY TO THE AFFECTED AREA ONCE DAILY   ondansetron  (ZOFRAN ) 8  MG tablet TAKE 1 TABLET EVERY 8 HOURS AS NEEDED FOR NAUSEA OR VOMITING   prochlorperazine  (COMPAZINE ) 10 MG tablet TAKE 1 TABLET EVERY 6 HOURS AS NEEDED FOR NAUSEA OR VOMITING   rosuvastatin  (CRESTOR ) 40 MG tablet Take 1 tablet (40 mg total) by mouth daily.   spironolactone (ALDACTONE) 25 MG tablet Take 12.5 mg by mouth. Twice daily   Tiotropium Bromide-Olodaterol (STIOLTO RESPIMAT ) 2.5-2.5 MCG/ACT AERS Inhale 2 puffs into the lungs daily.   traMADol  (ULTRAM ) 50 MG tablet Take 50 mg by mouth every 6 (six) hours as needed.   triamcinolone  (KENALOG ) 0.025 % cream Apply 1 Application topically 2  (two) times daily.   VENTOLIN  HFA 108 (90 Base) MCG/ACT inhaler USE 2 INHALATIONS EVERY 6 HOURS AS NEEDED FOR WHEEZING OR SHORTNESS OF BREATH   Current Facility-Administered Medications for the 01/15/24 encounter (Office Visit) with Anysia Choi D, NP  Medication   sodium chloride  flush (NS) 0.9 % injection 3 mL   sodium chloride  flush (NS) 0.9 % injection 3 mL   "

## 2024-01-15 ENCOUNTER — Ambulatory Visit: Attending: Cardiovascular Disease | Admitting: Cardiology

## 2024-01-15 ENCOUNTER — Encounter: Payer: Self-pay | Admitting: Cardiology

## 2024-01-15 VITALS — BP 130/48 | HR 67 | Ht 64.5 in | Wt 170.2 lb

## 2024-01-15 DIAGNOSIS — I739 Peripheral vascular disease, unspecified: Secondary | ICD-10-CM | POA: Diagnosis not present

## 2024-01-15 DIAGNOSIS — I10 Essential (primary) hypertension: Secondary | ICD-10-CM | POA: Diagnosis not present

## 2024-01-15 DIAGNOSIS — E782 Mixed hyperlipidemia: Secondary | ICD-10-CM | POA: Diagnosis not present

## 2024-01-15 LAB — BASIC METABOLIC PANEL WITH GFR
BUN/Creatinine Ratio: 11 — ABNORMAL LOW (ref 12–28)
BUN: 16 mg/dL (ref 8–27)
CO2: 21 mmol/L (ref 20–29)
Calcium: 9.4 mg/dL (ref 8.7–10.3)
Chloride: 102 mmol/L (ref 96–106)
Creatinine, Ser: 1.46 mg/dL — ABNORMAL HIGH (ref 0.57–1.00)
Glucose: 92 mg/dL (ref 70–99)
Potassium: 4.8 mmol/L (ref 3.5–5.2)
Sodium: 141 mmol/L (ref 134–144)
eGFR: 41 mL/min/1.73 — ABNORMAL LOW

## 2024-01-15 LAB — MAGNESIUM: Magnesium: 1.5 mg/dL — ABNORMAL LOW (ref 1.6–2.3)

## 2024-01-15 NOTE — Patient Instructions (Signed)
 Medication Instructions:  Your physician recommends that you continue on your current medications as directed. Please refer to the Current Medication list given to you today.  *If you need a refill on your cardiac medications before your next appointment, please call your pharmacy*  Lab Work: TODAY: BMET, Magnesium   If you have labs (blood work) drawn today and your tests are completely normal, you will receive your results only by: MyChart Message (if you have MyChart) OR A paper copy in the mail If you have any lab test that is abnormal or we need to change your treatment, we will call you to review the results.  Testing/Procedures: NONE  Follow-Up: At [facility], you and your health needs are our priority.  As part of our continuing mission to provide you with exceptional heart care, our providers are all part of one team.  This team includes your primary Cardiologist (physician) and Advanced Practice Providers or APPs (Physician Assistants and Nurse Practitioners) who all work together to provide you with the care you need, when you need it.  Your next appointment:   Follow up with Dr. Court in April 2026 ([facility] only)  Follow up with Dr. Barbaraann in Nov. 2026

## 2024-01-17 ENCOUNTER — Ambulatory Visit: Payer: Self-pay | Admitting: Cardiology

## 2024-01-19 ENCOUNTER — Encounter: Payer: Self-pay | Admitting: Cardiovascular Disease

## 2024-01-19 MED ORDER — MAGNESIUM OXIDE 400 MG PO TABS: 400.0000 mg | ORAL_TABLET | Freq: Every day | ORAL | 0 refills | Status: DC

## 2024-01-22 MED ORDER — MAGNESIUM OXIDE 400 MG PO TABS
400.0000 mg | ORAL_TABLET | Freq: Every day | ORAL | 0 refills | Status: AC
  Filled 2024-01-22 – 2024-01-31 (×2): qty 28, 28d supply, fill #0

## 2024-01-23 ENCOUNTER — Other Ambulatory Visit: Payer: Self-pay

## 2024-01-23 ENCOUNTER — Other Ambulatory Visit (HOSPITAL_COMMUNITY): Payer: Self-pay

## 2024-01-23 ENCOUNTER — Encounter: Payer: Self-pay | Admitting: Hematology and Oncology

## 2024-01-24 ENCOUNTER — Other Ambulatory Visit: Payer: Self-pay

## 2024-01-27 ENCOUNTER — Other Ambulatory Visit (HOSPITAL_COMMUNITY): Payer: Self-pay

## 2024-01-29 ENCOUNTER — Other Ambulatory Visit: Payer: Self-pay

## 2024-01-30 ENCOUNTER — Inpatient Hospital Stay: Attending: Hematology and Oncology

## 2024-01-30 VITALS — BP 164/67 | HR 55 | Temp 98.6°F | Resp 16

## 2024-01-30 DIAGNOSIS — Z79899 Other long term (current) drug therapy: Secondary | ICD-10-CM | POA: Diagnosis not present

## 2024-01-30 DIAGNOSIS — C50412 Malignant neoplasm of upper-outer quadrant of left female breast: Secondary | ICD-10-CM | POA: Insufficient documentation

## 2024-01-30 DIAGNOSIS — Z5111 Encounter for antineoplastic chemotherapy: Secondary | ICD-10-CM | POA: Diagnosis present

## 2024-01-30 DIAGNOSIS — Z171 Estrogen receptor negative status [ER-]: Secondary | ICD-10-CM | POA: Insufficient documentation

## 2024-01-30 MED ORDER — ACETAMINOPHEN 325 MG PO TABS
650.0000 mg | ORAL_TABLET | Freq: Once | ORAL | Status: AC
  Administered 2024-01-30: 650 mg via ORAL
  Filled 2024-01-30: qty 2

## 2024-01-30 MED ORDER — SODIUM CHLORIDE 0.9 % IV SOLN: INTRAVENOUS | Status: DC

## 2024-01-30 MED ORDER — DIPHENHYDRAMINE HCL 25 MG PO CAPS
50.0000 mg | ORAL_CAPSULE | Freq: Once | ORAL | Status: AC
  Administered 2024-01-30: 50 mg via ORAL
  Filled 2024-01-30: qty 2

## 2024-01-30 MED ORDER — TRASTUZUMAB-ANNS CHEMO 150 MG IV SOLR
6.0000 mg/kg | Freq: Once | INTRAVENOUS | Status: AC
  Administered 2024-01-30: 483 mg via INTRAVENOUS
  Filled 2024-01-30: qty 23

## 2024-01-30 NOTE — Patient Instructions (Signed)

## 2024-01-31 ENCOUNTER — Encounter: Payer: Self-pay | Admitting: Hematology and Oncology

## 2024-01-31 ENCOUNTER — Other Ambulatory Visit: Payer: Self-pay

## 2024-01-31 ENCOUNTER — Other Ambulatory Visit (HOSPITAL_COMMUNITY): Payer: Self-pay

## 2024-02-01 ENCOUNTER — Encounter: Payer: Self-pay | Admitting: Pharmacy Technician

## 2024-02-01 ENCOUNTER — Other Ambulatory Visit (HOSPITAL_COMMUNITY): Payer: Self-pay

## 2024-02-01 ENCOUNTER — Other Ambulatory Visit: Payer: Self-pay

## 2024-02-02 ENCOUNTER — Other Ambulatory Visit: Payer: Self-pay

## 2024-02-20 ENCOUNTER — Inpatient Hospital Stay

## 2024-02-20 ENCOUNTER — Inpatient Hospital Stay: Admitting: Hematology and Oncology

## 2024-02-20 ENCOUNTER — Telehealth: Payer: Self-pay

## 2024-02-20 ENCOUNTER — Encounter: Payer: Self-pay | Admitting: *Deleted

## 2024-02-20 NOTE — Telephone Encounter (Signed)
 Spoke to Mary Campbell and she will be here on Saturday for her appointment with Dr. Gudena and infusion. Andrea CHRISTELLA Plunk, RN

## 2024-02-24 ENCOUNTER — Inpatient Hospital Stay: Admitting: Hematology and Oncology

## 2024-02-24 ENCOUNTER — Inpatient Hospital Stay: Attending: Hematology and Oncology

## 2024-02-26 ENCOUNTER — Ambulatory Visit: Attending: General Surgery

## 2024-03-12 ENCOUNTER — Inpatient Hospital Stay
